# Patient Record
Sex: Female | Born: 1940 | Race: White | Hispanic: No | Marital: Married | Smoking: Never smoker
Health system: Southern US, Community
[De-identification: ages and names within clinical notes are randomized; demographics above are authoritative.]

## PROBLEM LIST (undated history)

## (undated) DIAGNOSIS — I509 Heart failure, unspecified: Secondary | ICD-10-CM

## (undated) DIAGNOSIS — F32A Depression, unspecified: Secondary | ICD-10-CM

## (undated) DIAGNOSIS — I2510 Atherosclerotic heart disease of native coronary artery without angina pectoris: Secondary | ICD-10-CM

## (undated) DIAGNOSIS — K219 Gastro-esophageal reflux disease without esophagitis: Secondary | ICD-10-CM

## (undated) DIAGNOSIS — S8290XA Unspecified fracture of unspecified lower leg, initial encounter for closed fracture: Secondary | ICD-10-CM

## (undated) DIAGNOSIS — E119 Type 2 diabetes mellitus without complications: Secondary | ICD-10-CM

## (undated) DIAGNOSIS — N189 Chronic kidney disease, unspecified: Secondary | ICD-10-CM

## (undated) DIAGNOSIS — F329 Major depressive disorder, single episode, unspecified: Secondary | ICD-10-CM

## (undated) DIAGNOSIS — K7290 Hepatic failure, unspecified without coma: Secondary | ICD-10-CM

## (undated) DIAGNOSIS — N19 Unspecified kidney failure: Secondary | ICD-10-CM

## (undated) DIAGNOSIS — I219 Acute myocardial infarction, unspecified: Secondary | ICD-10-CM

## (undated) DIAGNOSIS — M1990 Unspecified osteoarthritis, unspecified site: Secondary | ICD-10-CM

## (undated) DIAGNOSIS — I6529 Occlusion and stenosis of unspecified carotid artery: Secondary | ICD-10-CM

## (undated) DIAGNOSIS — E039 Hypothyroidism, unspecified: Secondary | ICD-10-CM

## (undated) DIAGNOSIS — I10 Essential (primary) hypertension: Secondary | ICD-10-CM

## (undated) HISTORY — PX: CHOLECYSTECTOMY: SHX55

## (undated) HISTORY — PX: AV FISTULA PLACEMENT: SHX1204

---

## 1999-01-26 ENCOUNTER — Emergency Department (HOSPITAL_COMMUNITY): Admission: EM | Admit: 1999-01-26 | Discharge: 1999-01-26 | Payer: Self-pay | Admitting: Emergency Medicine

## 1999-01-26 ENCOUNTER — Encounter: Payer: Self-pay | Admitting: Emergency Medicine

## 2006-02-07 ENCOUNTER — Inpatient Hospital Stay: Payer: Self-pay | Admitting: Internal Medicine

## 2006-02-07 ENCOUNTER — Other Ambulatory Visit: Payer: Self-pay

## 2006-07-09 ENCOUNTER — Emergency Department: Payer: Self-pay | Admitting: Emergency Medicine

## 2006-12-24 ENCOUNTER — Other Ambulatory Visit: Payer: Self-pay

## 2006-12-24 ENCOUNTER — Inpatient Hospital Stay: Payer: Self-pay | Admitting: Internal Medicine

## 2007-05-25 ENCOUNTER — Emergency Department: Payer: Self-pay | Admitting: Emergency Medicine

## 2008-03-06 ENCOUNTER — Ambulatory Visit: Payer: Self-pay | Admitting: Ophthalmology

## 2008-03-21 ENCOUNTER — Emergency Department: Payer: Self-pay | Admitting: Emergency Medicine

## 2008-07-16 ENCOUNTER — Ambulatory Visit: Payer: Self-pay | Admitting: Ophthalmology

## 2008-08-13 ENCOUNTER — Ambulatory Visit: Payer: Self-pay | Admitting: Ophthalmology

## 2009-04-26 ENCOUNTER — Emergency Department: Payer: Self-pay | Admitting: Emergency Medicine

## 2009-07-19 ENCOUNTER — Ambulatory Visit: Payer: Self-pay | Admitting: Oncology

## 2009-08-14 ENCOUNTER — Inpatient Hospital Stay: Payer: Self-pay | Admitting: Internal Medicine

## 2009-08-19 ENCOUNTER — Ambulatory Visit: Payer: Self-pay | Admitting: Oncology

## 2009-08-20 ENCOUNTER — Inpatient Hospital Stay: Payer: Self-pay | Admitting: Internal Medicine

## 2009-08-27 LAB — PATHOLOGY REPORT

## 2009-09-12 ENCOUNTER — Inpatient Hospital Stay: Payer: Self-pay | Admitting: Internal Medicine

## 2009-09-19 ENCOUNTER — Ambulatory Visit: Payer: Self-pay | Admitting: Internal Medicine

## 2009-10-03 ENCOUNTER — Emergency Department: Payer: Self-pay | Admitting: Emergency Medicine

## 2009-10-06 ENCOUNTER — Inpatient Hospital Stay: Payer: Self-pay | Admitting: Internal Medicine

## 2009-10-14 LAB — PATHOLOGY REPORT

## 2009-11-02 ENCOUNTER — Inpatient Hospital Stay: Payer: Self-pay | Admitting: Specialist

## 2009-11-24 ENCOUNTER — Emergency Department: Payer: Self-pay | Admitting: Emergency Medicine

## 2010-04-21 ENCOUNTER — Emergency Department: Payer: Self-pay | Admitting: Emergency Medicine

## 2010-05-28 ENCOUNTER — Ambulatory Visit: Payer: Self-pay | Admitting: Oncology

## 2010-06-20 ENCOUNTER — Ambulatory Visit: Payer: Self-pay | Admitting: Oncology

## 2010-07-20 ENCOUNTER — Ambulatory Visit: Payer: Self-pay | Admitting: Oncology

## 2010-07-22 ENCOUNTER — Ambulatory Visit: Payer: Self-pay | Admitting: Vascular Surgery

## 2010-08-01 ENCOUNTER — Ambulatory Visit: Payer: Self-pay | Admitting: Vascular Surgery

## 2010-08-06 ENCOUNTER — Inpatient Hospital Stay: Payer: Self-pay | Admitting: Internal Medicine

## 2010-08-15 ENCOUNTER — Emergency Department: Payer: Self-pay | Admitting: Emergency Medicine

## 2010-10-03 ENCOUNTER — Other Ambulatory Visit: Payer: Self-pay

## 2010-10-03 ENCOUNTER — Inpatient Hospital Stay: Payer: Self-pay | Admitting: Internal Medicine

## 2010-10-03 DIAGNOSIS — R072 Precordial pain: Secondary | ICD-10-CM

## 2010-10-15 ENCOUNTER — Other Ambulatory Visit: Payer: Self-pay | Admitting: Internal Medicine

## 2010-10-21 ENCOUNTER — Other Ambulatory Visit: Payer: Self-pay | Admitting: Nephrology

## 2010-10-28 ENCOUNTER — Other Ambulatory Visit: Payer: Self-pay | Admitting: Nephrology

## 2010-11-03 ENCOUNTER — Other Ambulatory Visit: Payer: Self-pay | Admitting: Nephrology

## 2010-11-11 ENCOUNTER — Other Ambulatory Visit: Payer: Self-pay | Admitting: Nephrology

## 2010-11-21 ENCOUNTER — Inpatient Hospital Stay: Payer: Self-pay | Admitting: Internal Medicine

## 2010-12-01 LAB — PATHOLOGY REPORT

## 2010-12-17 ENCOUNTER — Ambulatory Visit: Payer: Self-pay | Admitting: Vascular Surgery

## 2010-12-30 ENCOUNTER — Emergency Department: Payer: Self-pay | Admitting: Emergency Medicine

## 2011-01-15 IMAGING — CR DG CHEST 2V
1 series · 2 of 2 positions shown · non-contrast
Comparison: none

REASON FOR EXAM: weakness
COMMENTS:

PROCEDURE:     DXR - DXR CHEST PA (OR AP) AND LATERAL  - September 12, 2009  [DATE]
RESULT:     Comparison: 08/15/2009

[Series 1: view not recorded · 0.17mm/px · 2 of 2 slices shown]
[im 1/2]
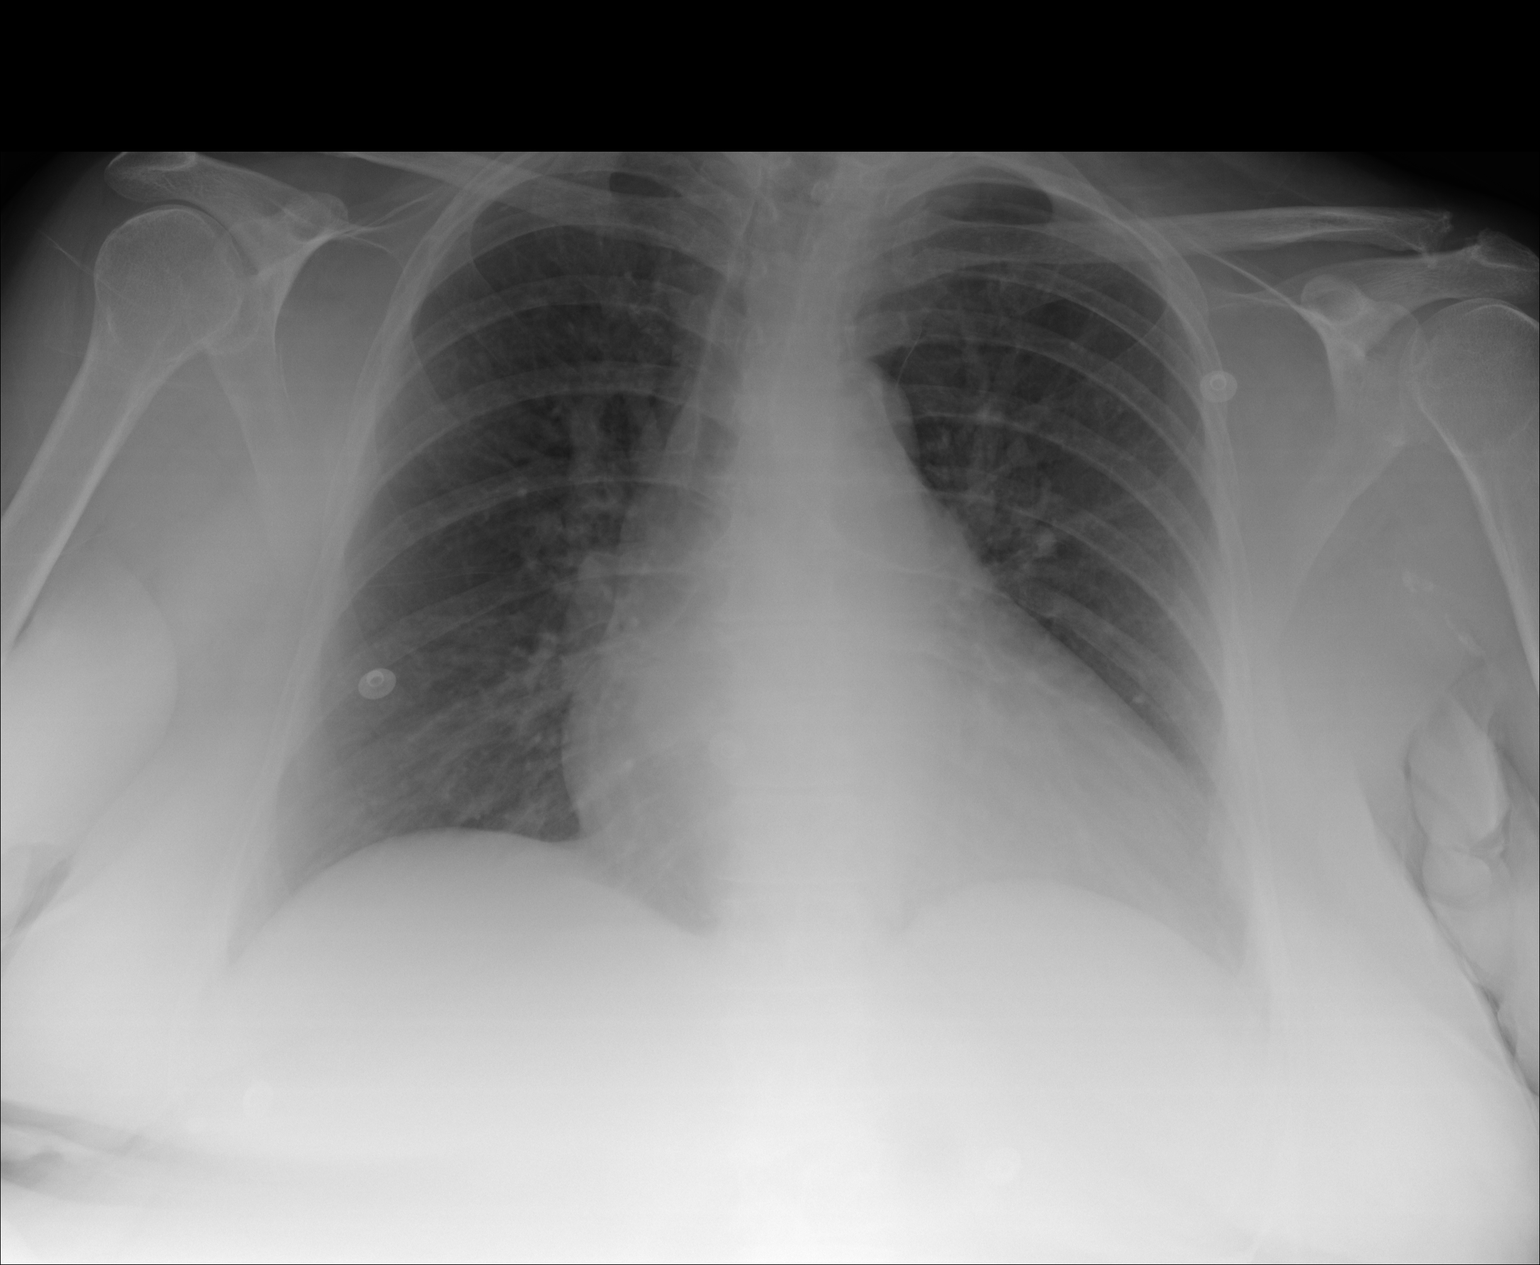
[im 2/2]
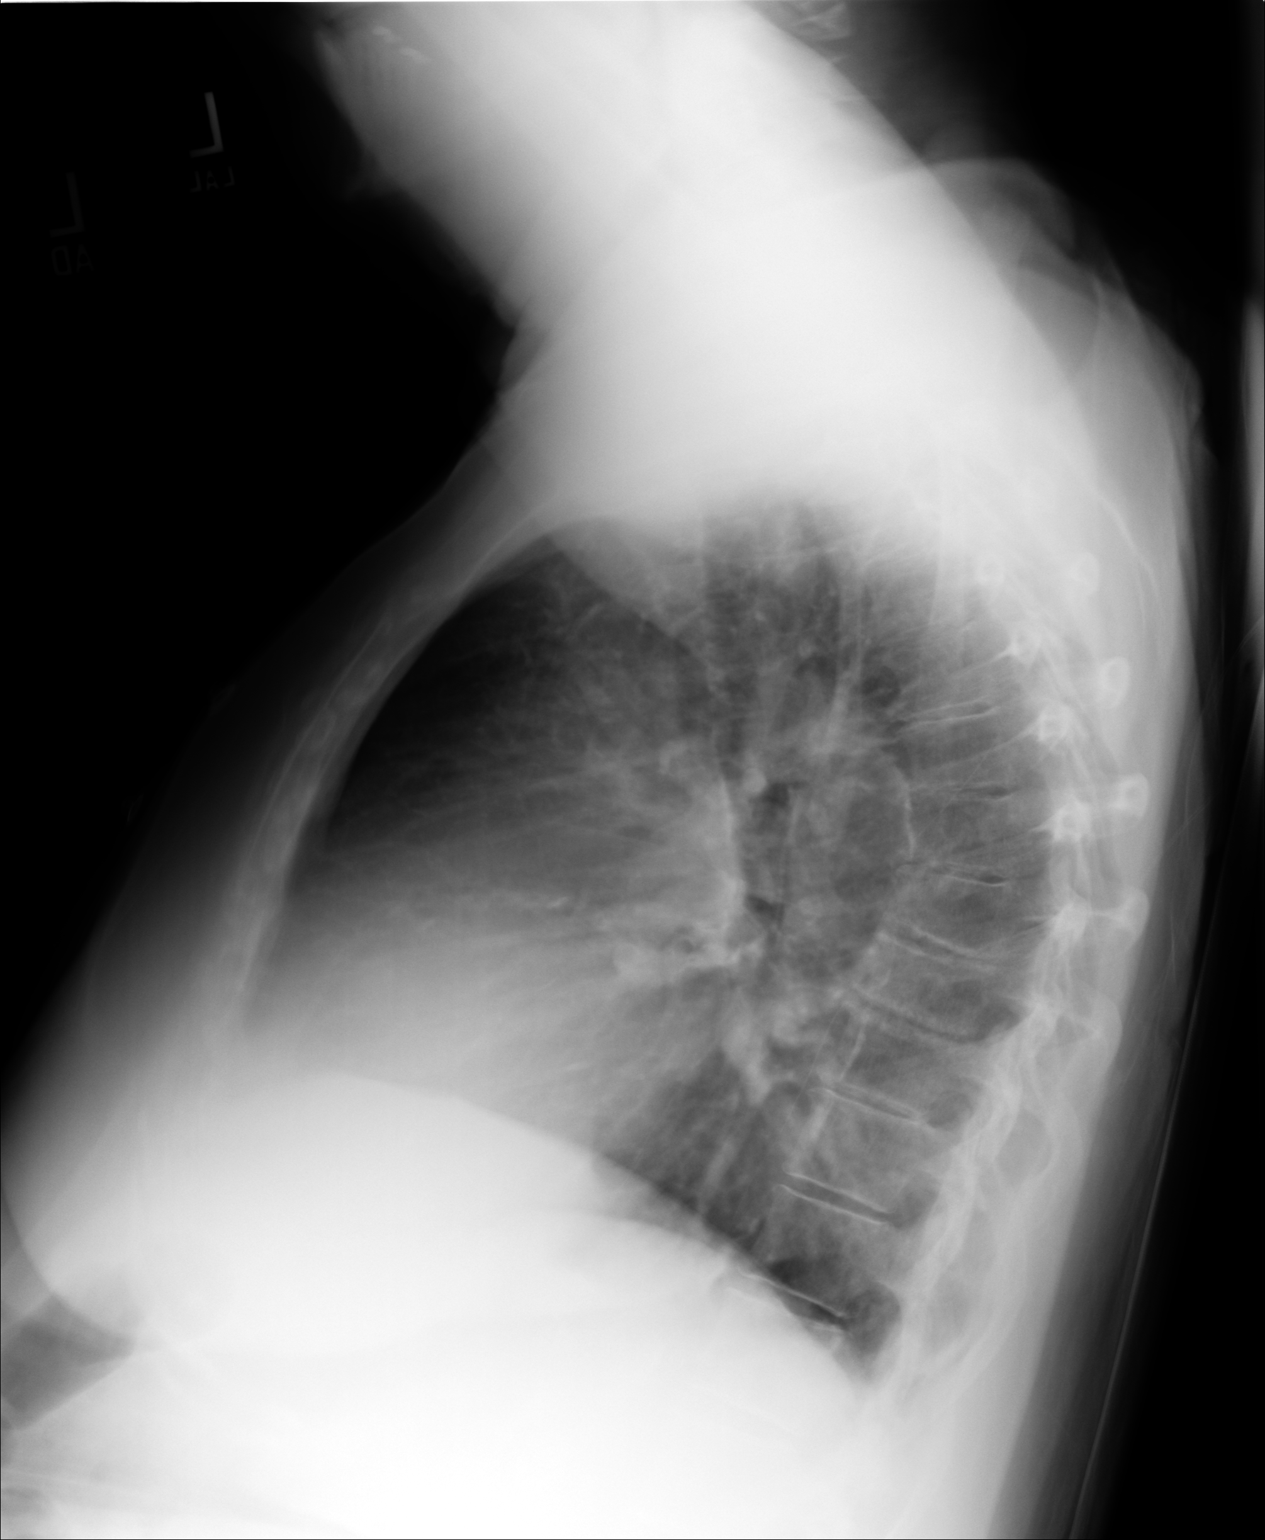

[2 of 2 positions shown; findings below may reference images not displayed]

FINDINGS: AP and lateral chest radiographs are provided. There is no focal parenchymal
opacity, pleural effusion, or pneumothorax. The heart size is enlarged. The
osseous structures are unremarkable.
IMPRESSION: No acute disease of the chest.

## 2011-03-18 ENCOUNTER — Ambulatory Visit: Payer: Self-pay | Admitting: Vascular Surgery

## 2011-03-18 LAB — POTASSIUM: Potassium: 4 mmol/L (ref 3.5–5.1)

## 2011-05-14 ENCOUNTER — Inpatient Hospital Stay: Payer: Self-pay | Admitting: *Deleted

## 2011-05-14 LAB — COMPREHENSIVE METABOLIC PANEL
Albumin: 2.7 g/dL — ABNORMAL LOW (ref 3.4–5.0)
Alkaline Phosphatase: 128 U/L (ref 50–136)
Anion Gap: 7 (ref 7–16)
BUN: 16 mg/dL (ref 7–18)
Bilirubin,Total: 0.6 mg/dL (ref 0.2–1.0)
Calcium, Total: 8.1 mg/dL — ABNORMAL LOW (ref 8.5–10.1)
Chloride: 101 mmol/L (ref 98–107)
Co2: 30 mmol/L (ref 21–32)
Creatinine: 2.2 mg/dL — ABNORMAL HIGH (ref 0.60–1.30)
EGFR (African American): 26 — ABNORMAL LOW
EGFR (Non-African Amer.): 22 — ABNORMAL LOW
Glucose: 145 mg/dL — ABNORMAL HIGH (ref 65–99)
Osmolality: 279 (ref 275–301)
Potassium: 3.8 mmol/L (ref 3.5–5.1)
SGOT(AST): 48 U/L — ABNORMAL HIGH (ref 15–37)
SGPT (ALT): 27 U/L
Sodium: 138 mmol/L (ref 136–145)
Total Protein: 6.1 g/dL — ABNORMAL LOW (ref 6.4–8.2)

## 2011-05-14 LAB — CBC
HCT: 30.9 % — ABNORMAL LOW (ref 35.0–47.0)
HGB: 10 g/dL — ABNORMAL LOW (ref 12.0–16.0)
MCHC: 32.4 g/dL (ref 32.0–36.0)
Platelet: 95 10*3/uL — ABNORMAL LOW (ref 150–440)
RBC: 3.31 10*6/uL — ABNORMAL LOW (ref 3.80–5.20)
RDW: 16.2 % — ABNORMAL HIGH (ref 11.5–14.5)
WBC: 5.9 10*3/uL (ref 3.6–11.0)

## 2011-05-14 LAB — URINALYSIS, COMPLETE
Glucose,UR: NEGATIVE mg/dL (ref 0–75)
Ketone: NEGATIVE
Nitrite: NEGATIVE
Ph: 5 (ref 4.5–8.0)
RBC,UR: 14 /HPF (ref 0–5)
Specific Gravity: 1.021 (ref 1.003–1.030)
Squamous Epithelial: 22

## 2011-05-14 LAB — TROPONIN I: Troponin-I: 0.02 ng/mL

## 2011-05-15 LAB — CBC WITH DIFFERENTIAL/PLATELET
Basophil: 1 %
Eosinophil: 4 %
HGB: 9.3 g/dL — ABNORMAL LOW (ref 12.0–16.0)
Lymphocytes: 20 %
MCH: 30.3 pg (ref 26.0–34.0)
MCHC: 32.7 g/dL (ref 32.0–36.0)
Monocytes: 20 %
Other Cells Blood: 2
Platelet: 87 10*3/uL — ABNORMAL LOW (ref 150–440)
Segmented Neutrophils: 53 %
WBC: 5.4 10*3/uL (ref 3.6–11.0)

## 2011-05-15 LAB — COMPREHENSIVE METABOLIC PANEL
BUN: 28 mg/dL — ABNORMAL HIGH (ref 7–18)
Bilirubin,Total: 0.4 mg/dL (ref 0.2–1.0)
Chloride: 103 mmol/L (ref 98–107)
EGFR (African American): 20 — ABNORMAL LOW
EGFR (Non-African Amer.): 17 — ABNORMAL LOW
Glucose: 55 mg/dL — ABNORMAL LOW (ref 65–99)
Osmolality: 286 (ref 275–301)
Potassium: 3.5 mmol/L (ref 3.5–5.1)
SGOT(AST): 41 U/L — ABNORMAL HIGH (ref 15–37)
SGPT (ALT): 26 U/L
Sodium: 142 mmol/L (ref 136–145)
Total Protein: 5.3 g/dL — ABNORMAL LOW (ref 6.4–8.2)

## 2011-05-15 LAB — MAGNESIUM: Magnesium: 1.9 mg/dL

## 2011-05-15 LAB — TROPONIN I
Troponin-I: <0.02 ng/mL
Troponin-I: 0.02 ng/mL

## 2011-05-15 LAB — CK TOTAL AND CKMB (NOT AT ARMC)
CK, Total: 82 U/L (ref 21–215)
CK-MB: 0.7 ng/mL (ref 0.5–3.6)

## 2011-05-16 LAB — URINE CULTURE

## 2011-05-16 LAB — CBC WITH DIFFERENTIAL/PLATELET
Basophil #: 0.1 10*3/uL (ref 0.0–0.1)
Eosinophil #: 0.3 10*3/uL (ref 0.0–0.7)
Eosinophil %: 5.2 %
HCT: 29.9 % — ABNORMAL LOW (ref 35.0–47.0)
HGB: 9.8 g/dL — ABNORMAL LOW (ref 12.0–16.0)
Lymphocyte #: 1.1 10*3/uL (ref 1.0–3.6)
Lymphocyte %: 16.3 %
MCH: 30.6 pg (ref 26.0–34.0)
MCV: 93 fL (ref 80–100)
Monocyte %: 26.5 %
WBC: 6.6 10*3/uL (ref 3.6–11.0)

## 2011-05-16 LAB — BASIC METABOLIC PANEL
Calcium, Total: 7.6 mg/dL — ABNORMAL LOW (ref 8.5–10.1)
Creatinine: 2.02 mg/dL — ABNORMAL HIGH (ref 0.60–1.30)
EGFR (African American): 28 — ABNORMAL LOW
EGFR (Non-African Amer.): 25 — ABNORMAL LOW
Osmolality: 281 (ref 275–301)
Potassium: 3.2 mmol/L — ABNORMAL LOW (ref 3.5–5.1)
Sodium: 142 mmol/L (ref 136–145)

## 2011-05-19 LAB — PHOSPHORUS: Phosphorus: 2.8 mg/dL (ref 2.5–4.9)

## 2011-05-20 LAB — CULTURE, BLOOD (SINGLE)

## 2011-05-21 LAB — CULTURE, BLOOD (SINGLE)

## 2011-05-22 LAB — CULTURE, BLOOD (SINGLE)

## 2011-05-24 LAB — CULTURE, BLOOD (SINGLE)

## 2011-12-16 ENCOUNTER — Ambulatory Visit: Payer: Self-pay | Admitting: Gastroenterology

## 2011-12-18 LAB — PATHOLOGY REPORT

## 2011-12-29 ENCOUNTER — Ambulatory Visit: Payer: Self-pay | Admitting: Vascular Surgery

## 2011-12-29 LAB — POTASSIUM: Potassium: 4.5 mmol/L (ref 3.5–5.1)

## 2011-12-29 LAB — GLUCOSE, RANDOM: Glucose: 354 mg/dL — ABNORMAL HIGH (ref 65–99)

## 2012-05-16 ENCOUNTER — Ambulatory Visit: Payer: Self-pay | Admitting: Vascular Surgery

## 2013-03-06 LAB — CBC WITH DIFFERENTIAL/PLATELET
Basophil #: 0.1 10*3/uL (ref 0.0–0.1)
Basophil %: 1.3 %
EOS PCT: 1 %
Eosinophil #: 0.1 10*3/uL (ref 0.0–0.7)
HCT: 35.1 % (ref 35.0–47.0)
HGB: 10.8 g/dL — AB (ref 12.0–16.0)
Lymphocyte #: 0.8 10*3/uL — ABNORMAL LOW (ref 1.0–3.6)
Lymphocyte %: 13.3 %
MCH: 27.2 pg (ref 26.0–34.0)
MCHC: 30.8 g/dL — AB (ref 32.0–36.0)
MCV: 88 fL (ref 80–100)
Monocyte #: 0.5 x10 3/mm (ref 0.2–0.9)
Monocyte %: 8.1 %
NEUTROS PCT: 76.3 %
Neutrophil #: 4.4 10*3/uL (ref 1.4–6.5)
PLATELETS: 102 10*3/uL — AB (ref 150–440)
RBC: 3.97 10*6/uL (ref 3.80–5.20)
RDW: 18 % — ABNORMAL HIGH (ref 11.5–14.5)
WBC: 5.8 10*3/uL (ref 3.6–11.0)

## 2013-03-06 LAB — URINALYSIS, COMPLETE
BILIRUBIN, UR: NEGATIVE
BLOOD: NEGATIVE
GLUCOSE, UR: NEGATIVE mg/dL (ref 0–75)
Ketone: NEGATIVE
NITRITE: NEGATIVE
Ph: 6 (ref 4.5–8.0)
RBC,UR: 3 /HPF (ref 0–5)
SPECIFIC GRAVITY: 1.015 (ref 1.003–1.030)

## 2013-03-06 LAB — BASIC METABOLIC PANEL
Anion Gap: 5 — ABNORMAL LOW (ref 7–16)
BUN: 9 mg/dL (ref 7–18)
CO2: 31 mmol/L (ref 21–32)
Calcium, Total: 8.4 mg/dL — ABNORMAL LOW (ref 8.5–10.1)
Chloride: 103 mmol/L (ref 98–107)
Creatinine: 1.64 mg/dL — ABNORMAL HIGH (ref 0.60–1.30)
EGFR (African American): 36 — ABNORMAL LOW
GFR CALC NON AF AMER: 31 — AB
Glucose: 177 mg/dL — ABNORMAL HIGH (ref 65–99)
Osmolality: 281 (ref 275–301)
POTASSIUM: 3.6 mmol/L (ref 3.5–5.1)
Sodium: 139 mmol/L (ref 136–145)

## 2013-03-06 LAB — TROPONIN I: Troponin-I: <0.02 ng/mL

## 2013-03-07 ENCOUNTER — Inpatient Hospital Stay: Payer: Self-pay | Admitting: Internal Medicine

## 2013-03-07 LAB — LIPID PANEL
CHOLESTEROL: 102 mg/dL (ref 0–200)
HDL Cholesterol: 43 mg/dL (ref 40–60)
LDL CHOLESTEROL, CALC: 33 mg/dL (ref 0–100)
TRIGLYCERIDES: 128 mg/dL (ref 0–200)
VLDL CHOLESTEROL, CALC: 26 mg/dL (ref 5–40)

## 2013-09-28 ENCOUNTER — Inpatient Hospital Stay: Payer: Self-pay | Admitting: Internal Medicine

## 2013-09-28 LAB — CBC WITH DIFFERENTIAL/PLATELET
BASOS ABS: 0.1 10*3/uL (ref 0.0–0.1)
BASOS PCT: 1.2 %
Eosinophil #: 0.1 10*3/uL (ref 0.0–0.7)
Eosinophil %: 1.4 %
HCT: 43.2 % (ref 35.0–47.0)
HGB: 13.2 g/dL (ref 12.0–16.0)
LYMPHS ABS: 0.9 10*3/uL — AB (ref 1.0–3.6)
Lymphocyte %: 14.4 %
MCH: 26 pg (ref 26.0–34.0)
MCHC: 30.6 g/dL — ABNORMAL LOW (ref 32.0–36.0)
MCV: 85 fL (ref 80–100)
MONO ABS: 0.5 x10 3/mm (ref 0.2–0.9)
MONOS PCT: 8.6 %
Neutrophil #: 4.5 10*3/uL (ref 1.4–6.5)
Neutrophil %: 74.4 %
Platelet: 122 10*3/uL — ABNORMAL LOW (ref 150–440)
RBC: 5.09 10*6/uL (ref 3.80–5.20)
RDW: 19.2 % — ABNORMAL HIGH (ref 11.5–14.5)
WBC: 6 10*3/uL (ref 3.6–11.0)

## 2013-09-28 LAB — COMPREHENSIVE METABOLIC PANEL
ALBUMIN: 3.1 g/dL — AB (ref 3.4–5.0)
ANION GAP: 11 (ref 7–16)
AST: 39 U/L — AB (ref 15–37)
Alkaline Phosphatase: 166 U/L — ABNORMAL HIGH
BILIRUBIN TOTAL: 0.5 mg/dL (ref 0.2–1.0)
BUN: 30 mg/dL — ABNORMAL HIGH (ref 7–18)
CHLORIDE: 103 mmol/L (ref 98–107)
Calcium, Total: 9.1 mg/dL (ref 8.5–10.1)
Co2: 25 mmol/L (ref 21–32)
Creatinine: 2.88 mg/dL — ABNORMAL HIGH (ref 0.60–1.30)
EGFR (Non-African Amer.): 16 — ABNORMAL LOW
GFR CALC AF AMER: 18 — AB
Glucose: 353 mg/dL — ABNORMAL HIGH (ref 65–99)
OSMOLALITY: 298 (ref 275–301)
Potassium: 3.8 mmol/L (ref 3.5–5.1)
SGPT (ALT): 27 U/L
SODIUM: 139 mmol/L (ref 136–145)
Total Protein: 6.7 g/dL (ref 6.4–8.2)

## 2013-09-28 LAB — URINALYSIS, COMPLETE
Bilirubin,UR: NEGATIVE
Glucose,UR: >=500 mg/dL (ref 0–75)
Ketone: NEGATIVE
Nitrite: NEGATIVE
Ph: 8 (ref 4.5–8.0)
Protein: 30
Specific Gravity: 1.01 (ref 1.003–1.030)
Squamous Epithelial: 23
WBC UR: 8 /HPF (ref 0–5)

## 2013-09-28 LAB — LIPASE, BLOOD: LIPASE: 243 U/L (ref 73–393)

## 2013-09-28 LAB — TROPONIN I: Troponin-I: <0.02 ng/mL

## 2013-09-29 LAB — RENAL FUNCTION PANEL
ALBUMIN: 2.6 g/dL — AB (ref 3.4–5.0)
ANION GAP: 11 (ref 7–16)
BUN: 33 mg/dL — ABNORMAL HIGH (ref 7–18)
CHLORIDE: 108 mmol/L — AB (ref 98–107)
CO2: 22 mmol/L (ref 21–32)
Calcium, Total: 8.4 mg/dL — ABNORMAL LOW (ref 8.5–10.1)
Creatinine: 2.73 mg/dL — ABNORMAL HIGH (ref 0.60–1.30)
EGFR (Non-African Amer.): 17 — ABNORMAL LOW
GFR CALC AF AMER: 19 — AB
Glucose: 273 mg/dL — ABNORMAL HIGH (ref 65–99)
OSMOLALITY: 298 (ref 275–301)
Phosphorus: 2.3 mg/dL — ABNORMAL LOW (ref 2.5–4.9)
Potassium: 3.7 mmol/L (ref 3.5–5.1)
SODIUM: 141 mmol/L (ref 136–145)

## 2013-09-29 LAB — CLOSTRIDIUM DIFFICILE(ARMC)

## 2013-09-30 LAB — WBCS, STOOL

## 2013-10-01 LAB — URINE CULTURE

## 2013-10-27 LAB — BASIC METABOLIC PANEL
ANION GAP: 9 (ref 7–16)
BUN: 15 mg/dL (ref 7–18)
CHLORIDE: 100 mmol/L (ref 98–107)
CO2: 32 mmol/L (ref 21–32)
Calcium, Total: 8.1 mg/dL — ABNORMAL LOW (ref 8.5–10.1)
Creatinine: 2.17 mg/dL — ABNORMAL HIGH (ref 0.60–1.30)
EGFR (African American): 29 — ABNORMAL LOW
EGFR (Non-African Amer.): 24 — ABNORMAL LOW
Glucose: 316 mg/dL — ABNORMAL HIGH (ref 65–99)
Osmolality: 294 (ref 275–301)
Potassium: 3.9 mmol/L (ref 3.5–5.1)
Sodium: 141 mmol/L (ref 136–145)

## 2013-10-27 LAB — CBC WITH DIFFERENTIAL/PLATELET
BASOS PCT: 1.1 %
Basophil #: 0.1 10*3/uL (ref 0.0–0.1)
EOS ABS: 0.1 10*3/uL (ref 0.0–0.7)
Eosinophil %: 1.7 %
HCT: 31.4 % — ABNORMAL LOW (ref 35.0–47.0)
HGB: 9.6 g/dL — AB (ref 12.0–16.0)
LYMPHS ABS: 0.8 10*3/uL — AB (ref 1.0–3.6)
LYMPHS PCT: 15.6 %
MCH: 25 pg — ABNORMAL LOW (ref 26.0–34.0)
MCHC: 30.5 g/dL — AB (ref 32.0–36.0)
MCV: 82 fL (ref 80–100)
MONO ABS: 0.5 x10 3/mm (ref 0.2–0.9)
Monocyte %: 10.6 %
Neutrophil #: 3.5 10*3/uL (ref 1.4–6.5)
Neutrophil %: 71 %
Platelet: 111 10*3/uL — ABNORMAL LOW (ref 150–440)
RBC: 3.83 10*6/uL (ref 3.80–5.20)
RDW: 19.1 % — AB (ref 11.5–14.5)
WBC: 4.9 10*3/uL (ref 3.6–11.0)

## 2013-10-28 ENCOUNTER — Inpatient Hospital Stay: Payer: Self-pay | Admitting: Orthopedic Surgery

## 2013-10-28 ENCOUNTER — Ambulatory Visit: Payer: Self-pay | Admitting: Orthopedic Surgery

## 2013-10-28 LAB — HEPATIC FUNCTION PANEL A (ARMC)
ALK PHOS: 150 U/L — AB
ALT: 35 U/L
Albumin: 3.1 g/dL — ABNORMAL LOW (ref 3.4–5.0)
Bilirubin, Direct: 0.2 mg/dL (ref 0.00–0.20)
Bilirubin,Total: 0.3 mg/dL (ref 0.2–1.0)
SGOT(AST): 48 U/L — ABNORMAL HIGH (ref 15–37)
TOTAL PROTEIN: 6.3 g/dL — AB (ref 6.4–8.2)

## 2013-10-28 LAB — BASIC METABOLIC PANEL
Anion Gap: 6 — ABNORMAL LOW (ref 7–16)
BUN: 21 mg/dL — AB (ref 7–18)
CALCIUM: 7.7 mg/dL — AB (ref 8.5–10.1)
CO2: 30 mmol/L (ref 21–32)
Chloride: 102 mmol/L (ref 98–107)
Creatinine: 2.75 mg/dL — ABNORMAL HIGH (ref 0.60–1.30)
EGFR (African American): 22 — ABNORMAL LOW
GFR CALC NON AF AMER: 18 — AB
GLUCOSE: 311 mg/dL — AB (ref 65–99)
Osmolality: 290 (ref 275–301)
Potassium: 3.9 mmol/L (ref 3.5–5.1)
SODIUM: 138 mmol/L (ref 136–145)

## 2013-10-28 LAB — PROTIME-INR
INR: 1.1
Prothrombin Time: 14.1 secs (ref 11.5–14.7)

## 2013-10-28 LAB — CK: CK, TOTAL: 502 U/L — AB

## 2013-10-29 LAB — CBC WITH DIFFERENTIAL/PLATELET
Basophil #: 0 10*3/uL (ref 0.0–0.1)
Basophil %: 0.3 %
EOS PCT: 1.7 %
Eosinophil #: 0.1 10*3/uL (ref 0.0–0.7)
HCT: 28.7 % — ABNORMAL LOW (ref 35.0–47.0)
HGB: 8.6 g/dL — ABNORMAL LOW (ref 12.0–16.0)
Lymphocyte #: 0.5 10*3/uL — ABNORMAL LOW (ref 1.0–3.6)
Lymphocyte %: 7.1 %
MCH: 25.1 pg — AB (ref 26.0–34.0)
MCHC: 29.9 g/dL — AB (ref 32.0–36.0)
MCV: 84 fL (ref 80–100)
MONOS PCT: 13.6 %
Monocyte #: 1 x10 3/mm — ABNORMAL HIGH (ref 0.2–0.9)
Neutrophil #: 5.9 10*3/uL (ref 1.4–6.5)
Neutrophil %: 77.3 %
Platelet: 103 10*3/uL — ABNORMAL LOW (ref 150–440)
RBC: 3.43 10*6/uL — ABNORMAL LOW (ref 3.80–5.20)
RDW: 19.5 % — ABNORMAL HIGH (ref 11.5–14.5)
WBC: 7.6 10*3/uL (ref 3.6–11.0)

## 2013-10-29 LAB — BASIC METABOLIC PANEL
Anion Gap: 8 (ref 7–16)
BUN: 36 mg/dL — AB (ref 7–18)
Calcium, Total: 7.7 mg/dL — ABNORMAL LOW (ref 8.5–10.1)
Chloride: 102 mmol/L (ref 98–107)
Co2: 30 mmol/L (ref 21–32)
Creatinine: 3.38 mg/dL — ABNORMAL HIGH (ref 0.60–1.30)
EGFR (African American): 17 — ABNORMAL LOW
EGFR (Non-African Amer.): 14 — ABNORMAL LOW
Glucose: 259 mg/dL — ABNORMAL HIGH (ref 65–99)
Osmolality: 297 (ref 275–301)
Potassium: 4.2 mmol/L (ref 3.5–5.1)
SODIUM: 140 mmol/L (ref 136–145)

## 2013-10-30 LAB — CBC WITH DIFFERENTIAL/PLATELET
BASOS ABS: 0 10*3/uL (ref 0.0–0.1)
BASOS PCT: 0.4 %
Eosinophil #: 0.1 10*3/uL (ref 0.0–0.7)
Eosinophil %: 0.9 %
HCT: 24.7 % — ABNORMAL LOW (ref 35.0–47.0)
HGB: 7.5 g/dL — AB (ref 12.0–16.0)
LYMPHS ABS: 0.6 10*3/uL — AB (ref 1.0–3.6)
Lymphocyte %: 8.8 %
MCH: 25.1 pg — ABNORMAL LOW (ref 26.0–34.0)
MCHC: 30.5 g/dL — ABNORMAL LOW (ref 32.0–36.0)
MCV: 82 fL (ref 80–100)
Monocyte #: 0.9 x10 3/mm (ref 0.2–0.9)
Monocyte %: 13.8 %
NEUTROS ABS: 4.9 10*3/uL (ref 1.4–6.5)
NEUTROS PCT: 76.1 %
PLATELETS: 87 10*3/uL — AB (ref 150–440)
RBC: 3 10*6/uL — ABNORMAL LOW (ref 3.80–5.20)
RDW: 19.1 % — AB (ref 11.5–14.5)
WBC: 6.4 10*3/uL (ref 3.6–11.0)

## 2013-10-30 LAB — RENAL FUNCTION PANEL
Albumin: 2.2 g/dL — ABNORMAL LOW (ref 3.4–5.0)
Anion Gap: 10 (ref 7–16)
BUN: 43 mg/dL — ABNORMAL HIGH (ref 7–18)
CALCIUM: 7.3 mg/dL — AB (ref 8.5–10.1)
CHLORIDE: 101 mmol/L (ref 98–107)
CREATININE: 3.34 mg/dL — AB (ref 0.60–1.30)
Co2: 25 mmol/L (ref 21–32)
EGFR (African American): 17 — ABNORMAL LOW
EGFR (Non-African Amer.): 14 — ABNORMAL LOW
Glucose: 287 mg/dL — ABNORMAL HIGH (ref 65–99)
OSMOLALITY: 293 (ref 275–301)
Phosphorus: 3.5 mg/dL (ref 2.5–4.9)
Potassium: 5 mmol/L (ref 3.5–5.1)
SODIUM: 136 mmol/L (ref 136–145)

## 2013-10-31 LAB — CBC WITH DIFFERENTIAL/PLATELET
Basophil #: 0 10*3/uL (ref 0.0–0.1)
Basophil %: 0.4 %
EOS PCT: 0.1 %
Eosinophil #: 0 10*3/uL (ref 0.0–0.7)
HCT: 26 % — ABNORMAL LOW (ref 35.0–47.0)
HGB: 7.8 g/dL — ABNORMAL LOW (ref 12.0–16.0)
LYMPHS ABS: 0.7 10*3/uL — AB (ref 1.0–3.6)
LYMPHS PCT: 10 %
MCH: 25 pg — AB (ref 26.0–34.0)
MCHC: 30.1 g/dL — ABNORMAL LOW (ref 32.0–36.0)
MCV: 83 fL (ref 80–100)
Monocyte #: 1.3 x10 3/mm — ABNORMAL HIGH (ref 0.2–0.9)
Monocyte %: 18 %
NEUTROS ABS: 5.2 10*3/uL (ref 1.4–6.5)
Neutrophil %: 71.5 %
PLATELETS: 103 10*3/uL — AB (ref 150–440)
RBC: 3.13 10*6/uL — ABNORMAL LOW (ref 3.80–5.20)
RDW: 19 % — ABNORMAL HIGH (ref 11.5–14.5)
WBC: 7.2 10*3/uL (ref 3.6–11.0)

## 2013-10-31 LAB — BASIC METABOLIC PANEL
ANION GAP: 10 (ref 7–16)
BUN: 36 mg/dL — AB (ref 7–18)
CO2: 29 mmol/L (ref 21–32)
Calcium, Total: 7.6 mg/dL — ABNORMAL LOW (ref 8.5–10.1)
Chloride: 99 mmol/L (ref 98–107)
Creatinine: 2.79 mg/dL — ABNORMAL HIGH (ref 0.60–1.30)
EGFR (African American): 21 — ABNORMAL LOW
EGFR (Non-African Amer.): 18 — ABNORMAL LOW
Glucose: 279 mg/dL — ABNORMAL HIGH (ref 65–99)
OSMOLALITY: 294 (ref 275–301)
Potassium: 4.2 mmol/L (ref 3.5–5.1)
Sodium: 138 mmol/L (ref 136–145)

## 2013-10-31 LAB — HEMOGLOBIN: HGB: 7.1 g/dL — ABNORMAL LOW (ref 12.0–16.0)

## 2013-11-01 LAB — BASIC METABOLIC PANEL
Anion Gap: 10 (ref 7–16)
BUN: 50 mg/dL — ABNORMAL HIGH (ref 7–18)
Calcium, Total: 7.7 mg/dL — ABNORMAL LOW (ref 8.5–10.1)
Chloride: 99 mmol/L (ref 98–107)
Co2: 27 mmol/L (ref 21–32)
Creatinine: 3.45 mg/dL — ABNORMAL HIGH (ref 0.60–1.30)
EGFR (African American): 17 — ABNORMAL LOW
GFR CALC NON AF AMER: 14 — AB
Glucose: 277 mg/dL — ABNORMAL HIGH (ref 65–99)
OSMOLALITY: 295 (ref 275–301)
Potassium: 4.4 mmol/L (ref 3.5–5.1)
Sodium: 136 mmol/L (ref 136–145)

## 2013-11-01 LAB — CBC WITH DIFFERENTIAL/PLATELET
BASOS ABS: 0.1 10*3/uL (ref 0.0–0.1)
BASOS PCT: 1.2 %
EOS PCT: 3.7 %
Eosinophil #: 0.2 10*3/uL (ref 0.0–0.7)
HCT: 28.7 % — ABNORMAL LOW (ref 35.0–47.0)
HGB: 8.6 g/dL — AB (ref 12.0–16.0)
LYMPHS PCT: 12.5 %
Lymphocyte #: 0.7 10*3/uL — ABNORMAL LOW (ref 1.0–3.6)
MCH: 25 pg — ABNORMAL LOW (ref 26.0–34.0)
MCHC: 30.1 g/dL — ABNORMAL LOW (ref 32.0–36.0)
MCV: 83 fL (ref 80–100)
MONOS PCT: 16.4 %
Monocyte #: 0.9 x10 3/mm (ref 0.2–0.9)
Neutrophil #: 3.7 10*3/uL (ref 1.4–6.5)
Neutrophil %: 66.2 %
Platelet: 110 10*3/uL — ABNORMAL LOW (ref 150–440)
RBC: 3.46 10*6/uL — ABNORMAL LOW (ref 3.80–5.20)
RDW: 19.1 % — ABNORMAL HIGH (ref 11.5–14.5)
WBC: 5.6 10*3/uL (ref 3.6–11.0)

## 2013-11-01 LAB — PHOSPHORUS: Phosphorus: 4.1 mg/dL (ref 2.5–4.9)

## 2013-11-02 LAB — BASIC METABOLIC PANEL
Anion Gap: 9 (ref 7–16)
BUN: 33 mg/dL — ABNORMAL HIGH (ref 7–18)
CHLORIDE: 100 mmol/L (ref 98–107)
CO2: 28 mmol/L (ref 21–32)
CREATININE: 2.69 mg/dL — AB (ref 0.60–1.30)
Calcium, Total: 7.9 mg/dL — ABNORMAL LOW (ref 8.5–10.1)
EGFR (Non-African Amer.): 18 — ABNORMAL LOW
GFR CALC AF AMER: 22 — AB
Glucose: 235 mg/dL — ABNORMAL HIGH (ref 65–99)
OSMOLALITY: 289 (ref 275–301)
POTASSIUM: 4.3 mmol/L (ref 3.5–5.1)
Sodium: 137 mmol/L (ref 136–145)

## 2013-11-02 LAB — HEMOGLOBIN A1C: Hemoglobin A1C: 9.4 % — ABNORMAL HIGH (ref 4.2–6.3)

## 2013-11-02 LAB — PLATELET COUNT: Platelet: 117 10*3/uL — ABNORMAL LOW (ref 150–440)

## 2013-11-02 LAB — HEMOGLOBIN: HGB: 9.5 g/dL — ABNORMAL LOW (ref 12.0–16.0)

## 2013-11-03 LAB — PHOSPHORUS: Phosphorus: 3 mg/dL (ref 2.5–4.9)

## 2014-01-18 ENCOUNTER — Encounter: Payer: Self-pay | Admitting: Surgery

## 2014-01-19 ENCOUNTER — Encounter: Payer: Self-pay | Admitting: Surgery

## 2014-02-01 ENCOUNTER — Encounter: Payer: Self-pay | Admitting: Surgery

## 2014-02-15 ENCOUNTER — Ambulatory Visit: Payer: Self-pay | Admitting: Family

## 2014-02-19 ENCOUNTER — Encounter: Payer: Self-pay | Admitting: Surgery

## 2014-03-02 ENCOUNTER — Inpatient Hospital Stay: Payer: Self-pay | Admitting: Internal Medicine

## 2014-03-20 ENCOUNTER — Encounter
Admit: 2014-03-20 | Disposition: A | Discharge status: Home / Self Care | Payer: Self-pay | Attending: Cardiothoracic Surgery | Admitting: Cardiothoracic Surgery

## 2014-03-20 ENCOUNTER — Encounter
Admit: 2014-03-20 | Disposition: A | Discharge status: Home / Self Care | Payer: Self-pay | Attending: Surgery | Admitting: Surgery

## 2014-03-27 ENCOUNTER — Ambulatory Visit: Payer: Self-pay | Admitting: Family

## 2014-04-20 ENCOUNTER — Encounter
Admit: 2014-04-20 | Disposition: A | Discharge status: Home / Self Care | Payer: Self-pay | Attending: Cardiothoracic Surgery | Admitting: Cardiothoracic Surgery

## 2014-05-08 NOTE — Op Note (Signed)
PATIENT NAME:  [name], Savannah Key MR#:  [id number] DATE OF BIRTH:  11-06-1940  DATE OF PROCEDURE:  12/29/2011  PREOPERATIVE DIAGNOSES:  1. End-stage renal disease requiring hemodialysis.  2. Poorly functioning dialysis access, left arm brachial axillary dialysis graft.   POSTOPERATIVE DIAGNOSIS:  1. End-stage renal disease requiring hemodialysis.  2. Poorly functioning dialysis access, left arm brachial axillary dialysis graft.   PROCEDURES PERFORMED: 1. Left upper extremity brachial axillary shuntogram.  2. Percutaneous transluminal angioplasty to 7 mm, venous portion left arm graft.   SURGEON: Levora DredgeGregory Rehaan Viloria, MD  SEDATION: Versed 3 mg plus fentanyl 100 mcg administered IV. Continuous ECG, pulse oximetry, and cardiopulmonary monitoring was performed throughout the entire procedure by the interventional radiology nurse. Total sedation time was 45 minutes.   ACCESS: 6 French sheath, antegrade direction, left arm AV graft.   CONTRAST USED: Isovue 25 mL.   FLUORO TIME: 0.7 minutes.   INDICATIONS: [name] is a 74 year old woman who was found to have increasing problems with her dialysis access. Physical examination as well as noninvasive studies demonstrated high-grade stricture, in the venous portion. She is therefore undergoing contrast angiography with the hope of intervention. Risks and benefits were reviewed, all questions answered, and the patient agrees to proceed.   DESCRIPTION OF PROCEDURE: The patient is taken to special procedures and placed in the supine position. After adequate sedation is achieved, she is positioned supine with her left arm extended palm upward. Left arm is prepped and draped in sterile fashion. 1% lidocaine is infiltrated in the soft tissues near the arterial anastomosis and access to the AV graft is obtained with a micropuncture needle, microwire followed by microsheath, J-wire followed by 6 French sheath. Hand injection of contrast is utilized to demonstrate  the graft as well as the central venous anatomy. After review of the images, 3000 units of heparin is given, Magic torque wire is advanced through the stricture, and a 7 x 6 Rival balloon is advanced across the stenoses and inflated to 14 atmospheres for one minute.    Followup angiography demonstrated complete resolution of the previous stenosis. With the balloon inflated, reflux images demonstrate a widely patent arterial.   INTERPRETATION: Initial views of the graft demonstrate greater than 80% stenosis, in the venous portion, of the AV graft, at the site of cannulation. Central veins are widely patent. Arterial anastomosis is widely patent. Following angioplasty to 7 mm, there is complete resolution of the stricture and stenosis.   SUMMARY: Successful salvage of left arm brachial axillary dialysis graft, as described above. ____________________________ Renford DillsGregory G. Ethal Gotay, MD ggs:slb D: 12/29/2011 16:46:33 ET T: 12/29/2011 17:15:59 ET JOB#: [id number]  cc: Renford DillsGregory G. Aroura Vasudevan, MD, <Dictator> Colette S. Freida BusmanAllen, NP Renford DillsGREGORY G Juno Alers MD ELECTRONICALLY SIGNED 01/06/2012 16:23

## 2014-05-11 NOTE — Op Note (Signed)
PATIENT NAME:  Savannah Key, Savannah Key MR#:  [id number] DATE OF BIRTH:  06-Oct-1940  DATE OF PROCEDURE:  05/16/2012  PREOPERATIVE DIAGNOSES: 1.  End-stage renal disease.  2.  Clotted left arm arteriovenous graft.  3.  Hypertension.   POSTOPERATIVE DIAGNOSES:  1.  End-stage renal disease. 2.  Clotted left arm arteriovenous graft. 3.  Hypertension.  PROCEDURE:  1.  Ultrasound guidance for vascular access to left arm arteriovenous graft in both an antegrade and retrograde fashion crossing.  2.  Left upper extremity shuntogram and central venogram.  3.  Catheter-directed thrombolysis to left arm arteriovenous graft with 4 mg of TPA instilled with the AngioJet AVX Catheter.  4.  Mechanical rheolytic thrombectomy with the AngioJet AVX Catheter to the arteriovenous graft.  5.  Fogarty embolectomy for arterial plug of the arteriovenous graft.  6.  Percutaneous transluminal angioplasty of arterial anastomosis for residual stenosis after Fogarty embolectomy with a 6 mm diameter angioplasty balloon.  7.  Percutaneous transluminal angioplasty of mid and distal graft for multiple areas of residual thrombosis and stenosis after thrombolysis and thrombectomy.   SURGEON: Annice NeedyJason S Dew, MD   ANESTHESIA: Local with moderate conscious sedation.   ESTIMATED BLOOD LOSS: Approximately 25 mL.  CONTRAST USED: 30 mL Visipaque.   FLUOROSCOPY TIME:  4 minutes were used.  INDICATION FOR PROCEDURE: The patient is a 74 year old white female with end-stage renal disease.  She presents with a clotted access. We are attempting to salvage this today.   DESCRIPTION OF PROCEDURE: The patient was brought to the vascular and interventional radiology suite. Left upper extremity was sterilely prepped and draped, and a sterile surgical field was created. The graft was accessed first in an antegrade fashion and then a retrograde fashion in crossing direction to gain access.  Ultrasound was used due to the pulseless nature of the graft,  and permanent image was recorded; 6-French sheaths were placed, and the patient was given 3000 units of intravenous heparin. Imaging showed a thrombosed graft. TPA 4 mg was instilled from the brachial artery to the axillary vein encompassing the length of the graft. This was allowed to dwell for approximately 10 to 15 minutes. Mechanical rheolytic thrombectomy was performed as well throughout the entirety of the graft. There was a residual arterial plug, and a 5 Fogarty embolectomy catheter was initially used with improvement but not resolution of the narrowing near the arterial anastomosis. I then performed percutaneous transluminal angioplasty with a 6 mm diameter angioplasty balloon with good angiographic completion result of the arterial anastomosis in the proximal portion of the graft. I then removed the retrograde sheath.  The mid-to-distal graft had areas of stenosis and thrombosis that were flow limiting. A 6 mm diameter x 10 cm angioplasty balloon was inflated encompassing these areas.  Waists were taken which resolved. Completion angiogram following this showed markedly improved flow with brisk outflow. The axillary vein, subclavian, innominate and superior vena cava were all widely patent. At this point, I elected to terminate the procedure. The second sheath was removed around a 4-0 Monocryl pursestring suture. Pressure was held. Sterile dressing was placed. The patient tolerated the procedure well and was taken to the recovery room in stable condition.    ____________________________ Annice NeedyJason S. Dew, MD jsd:cb D: 05/16/2012 17:49:25 ET T: 05/16/2012 21:05:02 ET JOB#: [id number]  cc: Annice NeedyJason S. Dew, MD, <Dictator> Annice NeedyJASON S DEW MD ELECTRONICALLY SIGNED 05/18/2012 13:23

## 2014-05-12 NOTE — Discharge Summary (Signed)
PATIENT NAME:  Savannah Key, Savannah Key MR#:  [id number] DATE OF BIRTH:  07-30-1940  DATE OF ADMISSION:  10/28/2013 DATE OF DISCHARGE:  11/04/2013  ADMITTING DIAGNOSIS: Right leg tibia and fibula fracture with probable acute compartment syndrome.   DISCHARGE DIAGNOSIS: Right leg tibia and fibula fracture with probable acute compartment syndrome.   OPERATION: On 10 /10/2013, the patient had an initial 4 compartment pressure assessment of the right leg for compartment syndrome. The patient had surgery initially by Dr. Annamary RummageJohn Sloboda with estimated blood loss minimal with less than 5 mL. The patient had an emergent fasciotomy of the right leg with no fixation of the fracture site. The patient then had a second surgery on 10/30/2013. The patient had a right tibial fracture closure of the fasciotomy with irrigation done by Dr. Ernest PineHooten and estimated blood loss was minimal with fluid replaced of 500 mL. The patient was stabilized and then brought back to the Operating Room on 11/01/2013 for a final reduction and intramedullary nailing of the right tibial shaft fracture again by Dr. Ernest PineHooten. The patient had estimated blood loss of 100 mL with fluids replaced 650 mL of crystalloid and 1 unit of packed red blood cells. Implants used at that time were Synthes 12 mm x 285 mm cannulated titanium tibial nail, four 5.0 mm locking screws. The patient was stabilized, brought to the recovery room, and then brought down to the orthopedic floor.   HISTORY: The patient is a 74 year old female who presented to the Emergency Room after a fall at home with acute leg pain. X-rays showed a tibial and fibular fracture with significant pain and swelling, concern for compartment syndrome. The patient was unable to do any type of motion of the ankle and with loss of sensation and palpable pulse.   PHYSICAL EXAMINATION: GENERAL: Alert female in acute distress.  HEART: Regular rate and rhythm with no murmur.  LUNGS: Clear to auscultation  bilaterally.  MUSCULOSKELETAL: In regard to the right lower extremity, the patient has her leg wrapped and immobilized from the OR. The patient has significant ecchymosis around the leg and has no motion of that ankle with hypersensitivity to any pain. X-rays revealed a right tibia and fibula displaced midshaft fracture.   HOSPITAL COURSE: After initial admission on 10/28/2013, the patient was brought to the orthopedic floor after having the emergent fasciotomy. The patient was followed by medicine and the patient then on postoperative day 2 from her initial fasciotomy had a closure of the fasciotomy by Dr. Ernest PineHooten. Then on postoperative day 4 from the initial surgery, which was October 14, she had the ORIF with tibial rodding done on 11/01/2013 by Dr. Ernest PineHooten. The patient did receive 1 unit of transfused blood. The patient was then treated postoperatively with physical therapy and pain control while on the orthopedic floor. The patient did slowly progress with physical therapy. The patient still had remarkable pain but was more comfortable as to her previous surgery. The patient was ready to go to rehab on 11/04/2013.   CONDITION AT DISCHARGE: Stable.   DISPOSITION: The patient was sent to rehab.   DISCHARGE INSTRUCTIONS: [facility] within the week with Dr. Ernest PineHooten and Van ClinesJon Wolfe. The patient will do weight on the affected extremity with partial weight-bear. The patient will raise her leg with 1 to 2 pillows to decrease swelling. The patient will use knee-high TED hose on both legs to be removed 1 hour 8 eight hour shift. The patient will use  the incentive spirometer and be encouraged to do cough and deep breathing. The patient does have a diabetic diet. The patient will use Polar Care to decrease swelling and keep her dressing clean and dry.  The patient will try not to get her dressing wet. The clinic will be called by rehab or the patient if there is any  bright red bleeding or any calf pain, or bowel or bladder difficulty, or any fever greater than 101.5. The patient will do physical therapy and occupational therapy per protocol. The patient is not allowed to do any showering until the staples are removed.   DISCHARGE MEDICATIONS: Levothyroxine 50 mcg 1 tablet daily, trazodone 50 mg 1 tablet at bedtime for insomnia, Nitrostat 0.4 mg sublingual every 5 minutes as needed for chest pain, hydroxyzine hydrochloride 10 mg 1 tablet b.i.d. for itching, famotidine 20 mg 1 tablet b.i.d., Effexor-XR 37.5 mg 1 capsule daily at nighttime, Humulin R 500 units/mL and to use 25 units subcutaneous once a day at supper, Drisdol 50,000 international units 1 capsule on the first Monday of the month, isosorbide mononitrate 60 mg 1 tablet daily, Lyrica 50 mg 1 capsule at bedtime, Protonix 40 mg 1 tablet b.i.d., Zyrtec 5 mg 1 tablet daily, Novolin R 100 units/mL use 80 units once a day and 35 every evening, Lipitor 80 mg 1 tablet daily, Colace orally daily, ropinirole 2 mg at bedtime, Levaquin 250 mg 1 tablet q. 48 hours, Vicodin 5/325 mg 1 to 2 tablets every 4 to 6 hours p.r.n. for severe to moderate pain, Tylenol 325 mg 2 tablets every 4 to 6 hours as needed for fever, heparin 5000 units q. 8 hours, insulin 100 units/mL subcutaneous p.r.n., aspart insulin 30 units subcutaneous, Flexeril 5 mg 1 tablet every 6 hours as needed for muscle spasm.     ____________________________ Savannah Key. Dedra Skeens, [place] jtm:at D: 11/04/2013 08:45:58 ET T: 11/04/2013 09:09:50 ET JOB#: [id number]  cc: J. Dedra Skeens, [place], <Dictator> J Alar[facility] PA ELECTRONICALLY SIGNED 11/06/2013 10:22

## 2014-05-12 NOTE — Op Note (Signed)
PATIENT NAME:  Savannah Key, PEROT MR#:  [id number] DATE OF BIRTH:  [birth year]  DATE OF PROCEDURE:  10/28/2013  PREOPERATIVE DIAGNOSIS: Acute compartmental syndrome, right leg, with closed right tibia-fibula fracture.   POSTOPERATIVE DIAGNOSIS: Acute compartmental syndrome, right leg, with closed right tibia-fibula fracture.   PROCEDURE PERFORMED: Right leg 4-compartment fasciotomy, splinting of right leg closed tibia-fibula fracture.   ANESTHESIA: General.   COMPLICATIONS: None apparent.   ESTIMATED BLOOD LOSS: 100 mL.   OPERATIVE FINDINGS: Completely viable muscle anterior, lateral, superficial, and deep posterior compartments. Ability to close medial wound completely. Ability to close approximately 60% of the lateral wound.  IMPLANTS: None.   MATERIALS TO LABORATORY: None.   INDICATIONS: Savannah Key is a 74 year old female who this evening sustained a fall with resultant tibia-fibula fracture. She had progressive pain, numbness, and tingling in her foot and elevated compartmental pressures in her anterior and deep posterior compartments of her right leg, indicating need for emergent fasciotomy. Risks, benefits, and alternatives were discussed with her to include, but not limited to, bleeding; infection; damage to blood vessels and nerves; need for further surgery and treatment; chronic pain; loss of function; stiffness; allergy; anesthetic risk; DVT; PE; heart, lung, brain, kidney complications; increased risk of infection. She appeared to understand these risks and benefits and desired to proceed with operative treatment.   DESCRIPTION OF PROCEDURE: After positive identification of the patient in the preoperative holding area, after informed consent had been obtained and the correct operative site had been confirmed by myself, the patient was taken to the operating room and placed in the supine position. General anesthesia was administered. The right leg was prepped and draped in the usual  sterile fashion. IV antibiotics were given before any skin incision had been made. Timeout was performed. Prescrub was performed on the right lower extremity. The anterior compartment was addressed first, given the highest pressures in this region including pressures of 34. A longitudinal incision was made coursing from the proximal portion of the leg, centered between the tibial crest and fibular shaft. This was extended distally. She did have transverse lacerations from prior incisions, one of which needed to be crossed at a 90-degree angle. Skin was undermined to expose the fascia. The superficial peroneal nerve was identified in its usual location over the distal leg and protected throughout the procedure. A standard fasciotomy using a scalpel and long Metzenbaum scissors was used, again protecting the superficial peroneal nerve. Muscle was viable in both the anterior and lateral compartments, including by visual assessment with color, capacity to bleed, and contractility.  Attention was then directed toward the medial leg, where a longitudinal incision was made approximately 1 cm posterior to the posteromedial tibial border. Incision was deepened to the saphenous neurovascular bundle. Branches of the saphenous vein were ligated as needed. The superficial posterior compartment was identified and released using a scalpel and Metzenbaum scissors, all under direct visualization. The soleus bridge was released over approximately 60% of its distal-most aspect to expose the underlying deep posterior compartment, which was then released under direct visualization using Metzenbaum scissors. Hemostasis was obtained. Both wounds were copiously irrigated with 3 L of normal saline. The medial wound could easily be closed. There was muscle bulge noted of both the anterior and deep posterior compartments, confirming compartmental syndrome. The medial wound was closed with 3-0 PDS and staples. The lateral wound was closed at  approximately 60% of the wound using, also, 3-0 Monocryl and staples. Superficial and posterior deep muscle musculature was also  contractile, with excellent capacity to bleed and color. 2+ dorsalis pedis pulses noted at the end of procedure. A short-leg sugar tong splint was placed with the foot in a plantigrade position. The patient was awakened and extubated in the operating room and taken to the recovery room in satisfactory condition without apparent operative site complications. These findings will be relayed to her family at a later time today, given the late hour at which we finished her surgery.   PLAN: She will be taken back to surgery in approximately to 48-72 hours for repeat I and D of the lateral wound as well as, most likely, IM nail fixation of her tibia-fibula fracture.  I will be signing the patient out to Dr. Ernest PineHooten late Sunday night or early Monday morning at approximately 4:00 Monday morning. These findings were related to the patient, as well as her need for further surgery. I will also contact her daughter, Harriett Sineancy.    ____________________________ Kyra SearlesJohn F. Pablo Mathurin, MD jfs:ST D: 10/28/2013 19:42:15 ET T: 10/28/2013 21:29:28 ET JOB#: [id number]  cc: Kyra SearlesJohn F. Liston Thum, MD, <Dictator> Kyra SearlesJOHN F Jonee Lamore MD ELECTRONICALLY SIGNED 10/29/2013 22:58

## 2014-05-12 NOTE — H&P (Signed)
PATIENT NAME:  Savannah Key, Savannah M MR#:  [id number] DATE OF BIRTH:  03/12/1940  DATE OF ADMISSION:  10/28/2013  REFERRING PHYSICIAN: Sharyn CreamerMark Quale, MD  PRIMARY CARE PHYSICIAN:  Lorre NickAnthony Allen, MD   CHIEF COMPLAINT: Leg pain.  HISTORY OF PRESENT ILLNESS: A 74 year old Caucasian female with past medical history of congestive heart failure, diastolic, last known ejection fraction of 60%, end-stage renal disease on hemodialysis Monday, Wednesday, Friday is presenting with leg pain. She suffered from a mechanical fall at home and developed acute right leg pain, described only as "pain rating 10 out of 10, nonradiating", worse with  movements. No relieving factors. Thus presented to the hospital for further work-up and evaluation. During the Emergency Room stay she was noticed to have increased swelling in the anterior compartment of the lower extremity with ecchymosis and edema. Orthopedic evaluated her in the ER and are concerned for compartment syndrome. They measured the pressures of the various compartments with the highest being 32, concerning for acute compartment syndrome. Currently, she is complaining only of right leg pain as described above; however, somewhat improved on medications. She is going to be taken to the OR for fasciotomy now.   REVIEW OF SYSTEMS:  CONSTITUTIONAL: Denies fever, fatigue, weakness.  EYES: Denies blurry vision, double vision or eye pain.  EARS, NOSE, THROAT: Denies tinnitus, ear pain or hearing loss.  RESPIRATORY: Denies cough, wheeze, shortness of breath.  CARDIOVASCULAR: Positive for dyspnea on exertion, which has been stable. Denies any chest pain, palpitations, edema, or orthopnea.  GASTROINTESTINAL: Denies any nausea, vomiting, diarrhea, abdominal pain.  GENITOURINARY: Denies dysuria, hematuria.  ENDOCRINE: Denies nocturia or thyroid problems.  HEMATOLOGIC AND LYMPHATIC: Denies easy bruising, bleeding.  SKIN: Denies rashes or lesions.  MUSCULOSKELETAL: Positive for  right leg pain as described above. Otherwise, denies any neck, back, shoulder or knee pain. Denies further arthritic symptoms.  NEUROLOGIC: Denies paralysis or paresthesias.  PSYCHIATRIC: Denies anxiety or depressive symptoms.   Otherwise, full review of systems performed by me is negative.   PAST MEDICAL HISTORY: End-stage renal disease on hemodialysis Monday, Wednesday, Friday secondary to complications of diabetes, hypertension, history of diabetes type 2 insulin-requiring, complicated by nephropathy and neuropathy, hypertension, hypothyroidism, gastroesophageal reflux disease, and diastolic congestive heart failure, last known ejection fraction of 60% stage II to III.   SOCIAL HISTORY: No alcohol, tobacco, or drug usage. Uses a walker for ambulation.   FAMILY HISTORY: Positive for coronary artery disease.   ALLERGIES: SULFA DRUGS.   HOME MEDICATIONS: Aspirin 81 mg p.o. q. daily, Imdur 60 mg p.o. q. daily, Nitrostat 0.4 mg sublingual every 5 minutes as needed for chest pain, Lyrica 50 mg p.o. at bedtime, Effexor 37.5 mg p.o. at bedtime, trazodone 50 mg p.o. at bedtime, Humulin 25 units subcutaneous at suppertime. Novolin 80 units in the morning and 35 units in the evening, , Lipitor 80 mg p.o. at bedtime, Requip 2 mg p.o. at bedtime, hydroxyzine 10 mg p.o. b.i.d., Pepcid 20 mg p.o. b.i.d., Colace 100 mg p.o. at bedtime, Protonix 40 mg p.o. b.i.d.,  levothyroxine 50 mcg p.o. q. daily, vitamin D3 50,000 international units p.o. q. monthly.   PHYSICAL EXAMINATION:  VITAL SIGNS: Temperature 98, heart rate 74, respirations 18, blood pressure 116/52, saturating 100% on room air. Weight 78 kg, BMI of 33.6.  GENERAL: Somewhat disheveled Caucasian female, currently in minimal distress given the leg pain.  HEAD: Normocephalic, atraumatic.  EYES: Pupils equal, round, reactive to light. Extraocular muscles intact. No scleral icterus.  MOUTH: Moist mucosal  membrane. Dentition intact. No abscess noted.   EARS, NOSE AND THROAT: Clear without exudates. No external lesions.  NECK: Supple. No thyromegaly. No nodules. No JVD.  PULMONARY: Clear to auscultation bilaterally without wheezes, rubs or rhonchi. No use of  accessory muscles. Good respiratory rate. CHEST: Nontender to palpation.  CARDIOVASCULAR: S1, S2, regular rate and rhythm. No murmurs, rubs, or gallops. No appreciable edema. Pedal pulses 2+ bilaterally.  EXTREMITIES: The right lower extremity currently wrapped and immobilized in preparation for the OR  GASTROINTESTINAL:  Soft, nontender, nondistended. No masses noted. Bowel sounds. No hepatosplenomegaly.  MUSCULOSKELETAL: Right lower extremity currently wrapped and immobilized, however,  prior to this a large area of ecchymosis as well as surrounding edema. Currently immobilized right lower extremity, otherwise range of motion full in all extremities.  NEUROLOGIC: Cranial nerves II through XII intact. No gross focal neurologic deficits. Sensation intact. Reflexes intact.  SKIN: No ulcerations, lesion, rash or cyanosis. Skin is warm and dry. Turgor intact.  PSYCHIATRIC:  Mood and affect within normal limits. Alert and oriented x 3. Insight and judgment intact.   LABORATORY DATA: EKG performed revealing normal sinus rhythm. No ST-T wave abnormalities. X-ray of the right tib fib area reveals displaced fracture of the mid shaft of the right tibia, spiral displaced fracture of the distal tibia, old fracture deformity of the distal fibula with a mild displaced fracture, midshaft of the right fibula also mention of disruption of ankle mortis with subtle widening of the medial tibiotalar space. This was followed by a CT of the right ankle revealing a spiral fracture to the distal tibia diaphysis with a few small osseous fragments arising at the distal tip   consistent with an avulsion fracture. Chest x-ray performed which reveals no acute cardiopulmonary process. Remainder of laboratory data: Sodium  141, potassium 3.9, chloride 100, bicarbonate 32, BUN 15, creatinine 2.17, glucose 316, CK 502. WBC 4.9, hemoglobin 9.6, platelets of 111,000. INR 1.1.   ASSESSMENT AND PLAN: A 74 year old Caucasian female with past medical history of diastolic congestive heart failure, stage II versus III somewhat limited by her everyday mobility,  ejection fraction of 60% as well as end-stage renal disease on hemodialysis Monday, Wednesday, Friday, which is secondary to complications of diabetes, hypertension, presenting with acute leg pain, found to have multiple fractures of the right tibia and fibula with concern for compartment syndrome.  1. Right tib-fib fracture with compartment syndrome. The case was discussed with orthopedics at bedside while the patient still remained in the Emergency Department. She is to go to the OR  now for fasciotomy as we felt elevated pressure in the anterior compartment of 32. with her diastolic blood pressure of 52, leaving her perfusion pressure less than 30. Post OR she should be stable enough to go to the orthopedic floor depending on how she does with her sedation and anesthesia. Regardless, post OR she will need adequate pain control as well as initiated on a  bowel regimen. The plan at this time is for delayed fixing of the fracture given her acute need for surgery now.   2. Preoperative evaluation. Given the urgency of the procedure of the fasciotomy, no further evaluation and testing required prior to surgery, regardless, even for the tibia fib fracture. She should be considered a moderate risk for moderate risk surgery. He does have cardiac risk factors, diastolic congestive heart failure, end stage renal disease on dialysis and insulin-requiring type 2 diabetes with METS less than 4 with generalized deconditioning requiring a walker.  Does have symptoms suggestive of congestive heart failure; however, these have been stable. She has no worsening symptoms concerning for active  congestive heart failure. No active chest pain, no active severe arrhythmias or valvular dysfunction. As far as medications are concerned, hold aspirin. Continue other medications.  3. Type 2 diabetes, insulin-requiring, complicated by nephropathy and neuropathy. Hold p.o. agents. Continue her 70/30 insulin; however, decrease doses to around half given her n.p.o. status for her surgery.  4. Gastroesophageal reflex disease.  Continue with PPI therapy.   5. End-stage renal disease on hemodialysis. Consult nephrology for continuation of dialysis.  6. Venous thromboembolism prophylaxis. Will defer to orthopedics, SCDs for now. Avoid any anticoagulation including heparin or Lovenox prior to surgery.   CODE STATUS: The patient is a full code.   TIME SPENT: 55 minutes.    ____________________________ Cletis Athens. Danel Requena, MD dkh:JT D: 10/28/2013 02:39:12 ET T: 10/28/2013 04:01:27 ET JOB#: [id number]  cc: Cletis Athens. Melody Cirrincione, MD, <Dictator> Domingos Riggi Synetta Shadow MD ELECTRONICALLY SIGNED 10/28/2013 20:35

## 2014-05-12 NOTE — H&P (Signed)
PATIENT NAME:  Savannah Key, Savannah Key MR#:  [id number] DATE OF BIRTH:  06-Feb-1940  DATE OF ADMISSION:  09/28/2013  REFERRING PHYSICIAN:  Dorothea Glassman, M.D.  PRIMARY CARE PHYSICIAN:  Dr. Freida Busman  CHIEF COMPLAINT: Weakness.   HISTORY OF PRESENT ILLNESS: A 74 year old Caucasian female with a history of end-stage renal disease on hemodialysis Monday, Wednesday, Friday secondary to diabetes and hypertension presenting with weakness. She describes a 3-day duration of diarrhea and decreased p.o. intake.  She described diarrhea, multiple watery bowel movements, approximately 5 to 7 bowel movements daily for the last 3 days with associated abdominal cramping located in the periumbilical region, 2 to 3 out of 10 in intensity, nonradiating; no worsening or relieving factors. Also describes having associated chills; however, denies any fevers.  According to the patient, she has not had a bowel movement the last 8 hours or so while being in the hospital.  I talked to her about being discharged home. She then stated that she was too weak to ambulate and was unsafe to go home.   REVIEW OF SYSTEMS:    CONSTITUTIONAL: Denies fever. Positive for chills, fatigue, weakness.  EYES: Denies blurred vision, double vision, eye pain.  HEENT: Denies tinnitus, ear pain, hearing loss.  RESPIRATORY: Denies cough, wheeze, shortness of breath.  CARDIOVASCULAR: Denies chest pain, palpitations, edema.  GASTROINTESTINAL: Positive for diarrhea, abdominal cramping as described above.  Denies nausea or vomiting.   GENITOURINARY: Denies dysuria or hematuria.  ENDOCRINE: Denies nocturia or thyroid problems. HEMATOLOGIC AND LYMPHATIC: Denies easy bruising or bleeding. SKIN: Denies rashes or lesions.  MUSCULOSKELETAL: Denies pain in neck, back, shoulder, knees, hips, or arthritic symptoms.  Positive for right-sided ankle pain after a fall about a week ago, however, without difficulty ambulating.   NEUROLOGIC: Denies paralysis, paresthesias.   PSYCHIATRIC: Denies any anxiety or depressive symptoms.  Otherwise, full review of systems performed and is negative.   PAST MEDICAL HISTORY: End-stage renal disease on hemodialysis secondary to complications of diabetes, hypertension, hypothyroidism, type II diabetes, insulin requiring with nephropathy and neuropathy, gastroesophageal reflux disease, diastolic congestive heart failure, last known ejection fraction was 60%.    SOCIAL HISTORY:  Denies alcohol, tobacco, or drug usage.  FAMILY HISTORY:  Positive for heart disease as well as kidney disease.  ALLERGIES:  SULFA DRUGS.  HOME MEDICATIONS:  Include: 1.  Aspirin 81 mg p.o. daily. 2.  Imdur 60 mg p.o. daily. 3.  Nitrostat 0.4 mg sublingually every 5 minutes as needed for chest pain. 4.  Lyrica 50 mg p.o. q.h.s. 5.  Effexor 37.5 mg p.o. q.h.s. 6.  Trazodone 50 mg p.o. q.h.s. 7.  Novolin 80 units in the morning and 35 units in the evening. 8.  Cetirizine 5 mg p.o. at bedtime. 9.  Lipitor 80 mg p.o. q.h.s. 10. Requip 1.5 mg p.o. q.h.s. 11. Hydroxyzine 10 mg p.o. b.i.d. 12. Famotidine 20 mg p.o. b.i.d. 13. Protonix 40 mg p.o. b.i.d. 14. Levothyroxine 50 mcg p.o. daily. 15. Vitamin D 50,000 international units q. monthly.  PHYSICAL EXAMINATION:  VITAL SIGNS: Temperature 98.3, heart rate 72, respirations 18, blood pressure 143/76, saturating 100% on room air, weight 81.2 kg, BMI 35.   GENERAL: Weak appearing Caucasian female appearing in no acute distress.  HEAD: Normocephalic, atraumatic.  EYES: Pupils equal, round, reactive to light. Extraocular muscles intact. No scleral icterus. Mouth:  Dry mucous membranes. Dentition intact. No abscess noted.  EARS, NOSE, AND THROAT: Clear, without exudates. No external lesions.  NECK: Supple. No thyromegaly. No nodules. No JVD.  PULMONARY: Clear to auscultation bilaterally without wheezes, rales, or rhonchi.  No use of accessory muscles. Good respiratory rate.  CHEST: Nontender to  palpation.  CARDIOVASCULAR: S1, S2, regular rate and rhythm.  No murmurs, rubs, or gallops. No edema. Pedal pulses 2+ bilaterally. GASTROINTESTINAL: Soft, nontender, nondistended. No masses. Positive bowel sounds. No hepatosplenomegaly.  MUSCULOSKELETAL: No swelling, clubbing, or edema. Range of motion full in all extremities.  She has mild edema around the right ankle without point tenderness over any bony prominence.  NEUROLOGIC: Cranial nerves II through XII intact. No gross focal neurological deficits. Sensation intact. Reflexes intact.   SKIN: No ulcerations, lesions, rashes, or cyanosis. Skin warm, dry. Turgor intact.  PSYCHIATRIC: Mood and affect blunted.  She is awake, alert, oriented x 3. Insight and judgment intact.   LABORATORY DATA: Sodium 139, potassium 3.8, chloride 103, bicarbonate 25, BUN 30, creatinine 2.8, glucose 353. LFTs: Albumin 3.1, alkaline phosphatase 166, AST 39; otherwise within normal limits.  WBCs 6, hemoglobin 13.2, platelets of 122,000. Urinalysis:  Epithelial cells 23, WBCs 8, RBCs 9, leukocyte esterase 3+.   ASSESSMENT AND PLAN:  611.  A 74 year old Caucasian female with history of end-stage renal disease on dialysis secondary to diabetes as well as hypertension, presenting with weakness after having multiple days of diarrhea as well as decreased p.o. intake and generalized weakness secondary to diarrhea and poor p.o. intake.  Provide gentle intravenous fluid hydration as she is a dialysis patient and get a physical therapy evaluation before discharge home.    2.  Diarrhea, likely viral etiology. She actually has had no further episodes since being in the hospital which is about 8 to 9 hours in total. Otherwise, we will provide as needed medications.   3.  Type 2 diabetes insulin requiring, complicated by nephropathy and neuropathy. Continue with home dose of Novolin.  Add insulin sliding scale with q. 6 hours Accu-Cheks.  4.  End-stage renal disease, on hemodialysis.  We will consult nephrology for continuation of  hemodialysis.   Venous thromboembolism prophylaxis with heparin subcutaneous.   CODE STATUS: Patient is full code.   TIME SPENT: 45 minutes.    ____________________________ Cletis Athensavid K. Hower, MD dkh:nr D: 09/28/2013 21:55:16 ET T: 09/28/2013 22:42:54 ET JOB#: [id number]  cc: Cletis Athensavid K. Hower, MD, <Dictator> Dorothea GlassmanPaul Malinda, MD Cato MulliganAlex F. Lyn HollingsheadAlexander, DDS  DAVID Synetta ShadowK HOWER MD ELECTRONICALLY SIGNED 09/29/2013 2:02

## 2014-05-12 NOTE — Discharge Summary (Signed)
PATIENT NAME:  [name], Arpi M MR#:  [id number] DATE OF BIRTH:  03-Jun-1940  DATE OF ADMISSION:  09/28/2013 DATE OF DISCHARGE:  09/30/2013  DISCHARGE DIAGNOSES: 1.  Diarrhea. 2.  Urinary tract infection.  3.  Generalized weakness.  4.  End-stage renal disease, on hemodialysis.  5.  Diabetes mellitus.  6.  Hypothyroidism.   CONSULTATIONS: Laverda SorensonSarath C. Kolluru, MD, nephrology   PROCEDURES: None.   HISTORY OF PRESENT ILLNESS: This 74 year old female with a history of end-stage renal disease, on hemodialysis Monday, Wednesday, and Friday, diabetes, hypertension, presents with weakness. She describes a 3-day duration of diarrhea and decreased p.o. intake. She describes multiple watery bowel movements, 5-7 a day, for the past 3 days with abdominal cramping. She also describes chills, no fevers. According to the patient she had not had a bowel movement within 8 hours of presentation to the hospital.   HOSPITAL COURSE BY PROBLEM:  1.  Diarrhea:  Resolved. C. difficile negative. The patient took Imodium prior to presentation at the hospital and had no further episodes of diarrhea. She had one formed stool during her admission.  2.  Possible urinary tract infection: The patient reported dysuria and urinalysis showed a weak positive for urinary tract infection with 8 white blood cells per high-powered field and very positive leukocyte esterase. She was started on Levaquin. The dose was adjusted due to renal failure to 250 mg every 48 hours. She will need one dose on September 13 and one on September 15, and then stop. Would recommend a probiotic after this course of antibiotics given her recent diarrhea.  3.  Generalized weakness: The patient was evaluated by physical therapy. Inpatient rehabilitation was recommended; however, the patient would like to go home. She reports that she has in-home equipment and 24-hour care. She is also a member of the [facility] and [facility] on Tuesdays and Thursdays. This has been discussed with the [facility] program and she will have ongoing physical therapy through them.  4.  End-stage renal disease: The patient received dialysis during her hospitalization and will continue to do so in the outpatient setting.  5.  Diabetes mellitus: No changes to the home regimen were made.  6.  Hypothyroidism: No changes were made to her dose of levothyroxine.   DISCHARGE PHYSICAL EXAMINATION: VITAL SIGNS: Temperature 98.1, pulse 74, respirations 16, blood pressure 101/65, oxygenation 97% on room air.  GENERAL: No acute distress, resting comfortably in the bed.  HEENT: Pink conjunctivae. Pupils equal, round, and reactive to light.  NECK: Supple, no mass, no thyroid tenderness.  RESPIRATORY: Normal respiratory effort. Clear breath sounds with good air movement.  CARDIOVASCULAR: Regular rate and rhythm. No murmurs, rubs, or gallops. No lower extremity edema.  ABDOMEN: Soft, nontender, nondistended. No guarding, no rebound.  SKIN: Normal to palpation. No rash, no ulcers.  NEUROLOGIC: Cranial nerves II to XII are grossly intact. Strength and sensation are normal.  PSYCHIATRIC: The patient is alert and oriented to time and place with good insight.   LABORATORY DATA: Sodium 141, potassium 3.6, chloride 108, bicarbonate 22, BUN 33, creatinine 2.7, hemoglobin 13.2, white blood cells 6.0, platelets 122,000, MCV 85. C. difficile negative. Urine culture shows mixed bacterial organisms suggestive of contamination.   CONDITION ON DISCHARGE: Stable.   DISPOSITION: The patient is discharged to home with 24-hour care by her family. Follow up at hemodialysis and also with the [facility].   DISCHARGE MEDICATIONS:  1.  Levothyroxine 50 mcg 1 tablet once a day.  2.  Trazodone 50 mg 1 tablet once a day.  3.  Nitrostat 0.4 mg sublingual tablet 1 tablet every 5 minutes as needed for chest pain.  4.  Hydroxyzine hydrochloride 10 mg 1 tablet  2 times a day as needed for itching.  5.  Famotidine 20 mg 1 tablet twice a day.  6.  Effexor extended release 37.5 mg oral capsule 1 capsule once a day at bedtime.  7.  Humulin R 500 units/mL subcutaneous solution 25 units once a day at bedtime.  8.  Drisdol 50,000 units oral capsule 1 capsule once a month on the 1st Monday of the month.  9.  Aspirin enteric coated 81 mg oral 1 capsule once a day.  10.  Isosorbide mononitrate 60 mg 1 tablet once a day for angina.  11.  Lyrica 50 mg 1 capsule once a day for leg pain.  12.  Protonix 40 mg 1 tablet twice a day for reflux.  13.  Cetirizine 5 mg 1 tablet once a day.  14.  Novolin R 80 units once a day in the morning and 35 units every evening.  15.  Lipitor 80 mg 1 tablet once a day for cholesterol.  16.  Colace 100 mg once a day at bedtime.  17.  Ropinirole 2 mg orally once a day at bedtime.  18.  Levofloxacin 250 mg 1 tablet every 48 hours on September 13 and September 15, and then stop.   DISCHARGE INSTRUCTIONS: Please follow up with hemodialysis and the [facility].   DIET: Renal diet.   ACTIVITY: As tolerated.   TIME SPENT ON DISCHARGE: 45 minutes.     ____________________________ Ena Dawley. Clent Ridges, MD cpw:ts D: 10/01/2013 15:22:09 ET T: 10/01/2013 16:51:33 ET JOB#: [id number]  cc: Santina Evans P. Clent Ridges, MD, <Dictator> Gale Journey MD ELECTRONICALLY SIGNED 10/04/2013 11:10

## 2014-05-12 NOTE — Op Note (Signed)
PATIENT NAME:  Savannah Key, [name] MR#:  [id number] DATE OF BIRTH:  Apr 23, 1940  DATE OF PROCEDURE:  10/28/2013  PREOPERATIVE DIAGNOSIS: Right leg tibia-fibula fracture, with probable acute compartmental syndrome.  POSTOPERATIVE DIAGNOSIS: Right leg closed tibia-fibula fracture, midshaft, with acute compartmental syndrome.   PROCEDURE PERFORMED:  Four-compartment pressure assessment right leg.   SURGEON: Kyra SearlesJohn F Jehad Bisono, MD.   ASSISTANTS:  None.   ANESTHESIA: IV narcotics.   ESTIMATED BLOOD LOSS: Minimal, less than 5 mL.   OPERATIVE FINDINGS:  Compartment pressure assessment 32, anterior lateral compartment pressure assessment 19,  superficial posterior compartment pressure 9, and deep posterior compartment pressure 24, all done with a blood pressure of 130/52.   INDICATIONS: Savannah Key is a 74 year old female who sustained a ground-level fall with resultant displaced tibia fracture and worsening pain in the Emergency Department despite IV narcotic management and numbness complaints in her right foot elevating concerns for acute compartmental syndrome.   PHYSICAL EXAMINATION: Suggestive of probable compartmental syndrome, and as such compartment pressure assessment was indicated.  The risks, benefits and alternatives were discussed with her including, damage to blood vessels, nerves and infection, especially given her prior history. She appeared to understand these risks and benefits and desired to proceed with operative treatment.   DESCRIPTION OF PROCEDURE: After positive identification of the of the patient and after confirmation of the correct procedural site and informed consent had been obtained, the anterior and lateral aspects as well as the posteromedial aspects of the right leg were prepped with iodine solution. Blood pressure was assessed at 130/52.  Anterior lateral and then superficial and deep posterior compartment pressure assessments were made with the Stryker compartmental pressure  device. Pressures were recorded and the patient tolerated the procedure well.   IMPRESSION: Right leg acute compartmental syndrome.   PLAN:  Emergent fasciotomy right leg.    ____________________________ Kyra SearlesJohn F. Edelyn Heidel, MD jfs:lt D: 10/28/2013 03:00:03 ET T: 10/28/2013 08:25:36 ET JOB#: [id number]  cc: Kyra SearlesJohn F. Johnmark Geiger, MD, <Dictator> Kyra SearlesJOHN F Tramell Piechota MD ELECTRONICALLY SIGNED 10/29/2013 22:58

## 2014-05-12 NOTE — Consult Note (Signed)
PATIENT NAME:  Savannah Key, Savannah Key MR#:  [id number] DATE OF BIRTH:  Feb 09, 1940  DATE OF CONSULTATION:  10/28/2013  REFERRING PHYSICIAN:  Emergency Department  CONSULTING PHYSICIAN:  Kyra SearlesJohn F. Mozes Sagar, MD   CHIEF COMPLAINT: Right leg pain.   HISTORY OF PRESENT ILLNESS: Savannah Key is a 74 year old female who was walking in her home this evening and tripped, sustaining a twisting injury to her right lower extremity. She had immediate pain, deformity and inability to bear weight on the right lower extremity. She was brought to [facility] Emergency Department where she was diagnosed with a right tibia fracture, closed.   She also had progressive complaints of pain, as well as cramping in the right leg with increased her pain during her ED course.   I was consulted for a possible compartmental syndrome of the right leg.   The patient complains of sharp, near constant pain with cramping intermittent pain in the right leg, as well as numbness in the right foot.   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   PAST SURGICAL HISTORY: Remarkable for right leg laceration, shunt placement for hemodialysis and coronary artery stent x 2.   ALLERGIES: CURRENT ALLERGIES INCLUDE SULFA.   CURRENT MEDICATIONS INCLUDE: Aspirin, cetirizine, Colace, Drisdol, Effexor, famotidine, Humulin, hydroxyzine, isosorbide mononitrate, levofloxacin, levothyroxine, Lipitor, Lyrica, Nitrostat, Novolin, Protonix, ropinirole, and trazodone.   SOCIAL HISTORY: The patient currently lives at home with her family.   FAMILY HISTORY: Noncontributory.   SOCIAL HISTORY: The patient does not work. She is dialysis dependent with her chronic kidney disease. She denies any alcohol or tobacco use.   PHYSICAL EXAMINATION:   GENERAL: Pleasant, alert, elderly female.  PSYCHIATRIC: Mood and affect appropriate.  HEENT: Normocephalic, atraumatic. Sclerae clear. Oral mucosa membranes are slightly dry.  HEART: Regular rate and rhythm. Normal S1, S2. No obvious murmurs.  LUNGS: Clear to auscultation bilaterally.  ABDOMEN: Soft, nontender, nondistended. Positive bowel sounds.  SKIN: Skin is warm and intact over the right leg.  VASCULAR EXAMINATION: There are 2+ dorsalis pedis and posterior tibialis pulses.  LYMPHATIC: Moderate swelling, right leg.  MUSCULOSKELETAL: Bilateral upper extremities and left lower extremity nontender to palpation without deformity. Evaluation of the right lower extremity demonstrates moderate tenderness to palpation with moderate swelling over the right mid tibia. The posterior aspect of the leg has moderate swelling as well. There is pain with passive extension and flexion of the large toe, as well as pain with passive flexion and extension at the ankle; they are difficult to assess secondary to the fracture location.  NEUROLOGIC: At the foot demonstrates decreased light touch sensation compared to the left foot in all distributions,  including the deep and superficial peroneal, saphenous, sural, lateral and medial plantar branches. She states that her right foot sensation is usually better compared to her left foot sensation with her diabetic neuropathy. The anterior compartment and lateral compartment are firm to palpation.  Motor examination is intact at the right foot with toe flexion and extension. Ankle flexion and extension elicits pain.   PROCEDURE NOTE: For compartment pressure assessment to be dictated on a separate operative note.  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 The spiral  fracture appears to exit in the supramalleolar area of the tibia.   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   Compartment pressure assessment of the right leg demonstrated anterior compartment pressure of 32, lateral compartment pressure of 19, superficial compartment pressure 9 and deep posterior 24, all with a blood pressure of 130/52.   IMPRESSION:  1.  Right leg tibia-fibula fracture.  2.  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  3.  Acute compartment syndrome, right leg and history of multiple medical problems, including coronary artery disease, chronic kidney disease and diabetes mellitus.   PLAN: We will proceed with emergent fasciotomy of the right leg. The risks, benefits, and alternatives were discussed with the patient to include, but not limited to bleeding, infection, damage to blood vessels and nerves, need for further surgery and treatment, chronic pain, loss of function, stiffness, allergy, anesthetic risk, DVT, PE, heart, lung, brain, kidney complications, eventual need for fracture fixation most likely, including probable IM rod, possible need for fixation at the ankle as well, significantly increased risk of infection given history of diabetes and hemodialysis need for her chronic kidney disease. She understands that we will just splint her fracture tonight and proceed with fracture fixation once her soft tissue envelope is more appropriate for surgery for definitive fracture fixation. She appeared to understand the risks and benefits, and desired to proceed with surgical treatment.    ____________________________ Kyra Searles, MD jfs:MT D: 10/28/2013 02:57:19 ET T: 10/28/2013 05:23:54 ET JOB#: [id number]  cc: Kyra Searles, MD, <Dictator> Kyra Searles MD ELECTRONICALLY SIGNED 10/29/2013 22:57

## 2014-05-12 NOTE — H&P (Signed)
PATIENT NAME:  Savannah Key, Savannah Key MR#:  [id number] DATE OF BIRTH:  [birth year]  DATE OF ADMISSION:  03/06/2013  REFERRING PHYSICIAN: Dr. Shaune Pollack.   PRIMARY CARE PHYSICIAN: Dr. Freida Busman.   CHIEF COMPLAINT: Dizziness.   A 74 year old Caucasian female with history of end-stage renal disease on hemodialysis of approximately 2 years' duration, hypertension, type 2 diabetes, insulin-requiring, presenting with dizziness. She describes dizziness for 1 week duration, which is mainly described as double vision with associated headache, frontal, described as throbbing, intensity 4 to 5 out of 10 and intermittent at best. No worsening or relieving factors. She has double vision but no blurred vision. No eye pain. She has also had poor coordination in the past couple of days requiring her to use a walker for ambulation. Currently without complaints.   REVIEW OF SYSTEMS:    CONSTITUTIONAL: Denies fever, fatigue, weakness.  EYES: Denies blurred vision. Positive for double vision. Denies eye pain.  EARS, NOSE, THROAT: Denies tinnitus, ear pain, hearing loss.  RESPIRATORY: Denies cough, wheeze, shortness of breath.  CARDIOVASCULAR: Denies chest pain, palpitations, edema. GASTROINTESTINAL:  Denies nausea, vomiting, diarrhea or abdominal pain.  GENITOURINARY: Denies dysuria or hematuria.  ENDOCRINE: Denies nocturia or thyroid problems.  HEMATOLOGIC AND LYMPHATICS: Denies easy bruising or bleeding.  SKIN: Denies rashes or lesions.  MUSCULOSKELETAL: Denies pain in neck, back, shoulder, knees, hips, arthritic symptoms.  NEUROLOGIC: Denies paralysis, paresthesias, weakness, dysarthria. Positive for headache.  PSYCHIATRIC: Denies anxiety or depressive symptoms. Otherwise, full review of systems performed by me is negative.   PAST MEDICAL HISTORY: Hypothyroidism, end-stage renal disease on hemodialysis Monday, Wednesday, Friday, type 2 diabetes, insulin-requiring; depression, anxiety, not otherwise specified; gastroesophageal  reflux disease, hypertension, congestive heart failure of unknown ejection fraction.   SOCIAL HISTORY: Denies alcohol, tobacco or drug usage.   FAMILY HISTORY: Positive for kidney disease, as well as heart disease.   ALLERGIES: SULFA DRUGS.   HOME MEDICATIONS: Include aspirin 81 mg p.o. daily, Imdur 60 mg p.o. daily, Nitrostat 0.4 mg sublingual every 5 minutes as needed for chest pain, Lyrica 50 mg p.o. at bedtime, Effexor 37.5 mg p.o. at bedtime, trazodone 50 mg p.o. at bedtime, Humulin 25 units subcutaneous at supper, Novolin 80 units in the morning, 35 in the evening; cetirizine 5 mg p.o. at bedtime, Lipitor 80 mg p.o. at bedtime, Rohypnol 1 mg p.o. at bedtime, hydroxyzine 10 mg p.o. b.i.d. as needed for itching, famotidine 20 mg p.o. b.i.d., Protonix 40 mg p.o. b.i.d., levothyroxine 50 mcg p.o. daily, vitamin D 50,000 International Units p.o. monthly.   PHYSICAL EXAMINATION: VITAL SIGNS: Temperature 98.5, heart rate 80, respirations 18, blood pressure 110/53, saturating 93% on room air. Weight 77.6 kg. BMI 33.4.  GENERAL: Well-nourished, obese Caucasian female, currently in no acute distress.  HEAD: Normocephalic, atraumatic.  EYES: Pupils equal, round and reactive to light. Extraocular muscles intact. No scleral icterus. No nystagmus.  MOUTH: Moist mucosal membranes. Dentition poor. No abscesses noted.  EAR, NOSE, THROAT:  Clear without exudates.  No external lesions. NECK: Supple. No thyromegaly. No nodules. No JVD.  PULMONARY: Clear to auscultation bilaterally without wheeze, rales, rhonchi. No use of accessory muscles. Good respiratory effort.  CHEST: Nontender to palpation.  CARDIOVASCULAR: S1, S2, regular rate and rhythm. No murmurs, rubs or gallops. No edema.  Pedal pulses 2+ bilaterally.   GASTROINTESTINAL:  Soft, nontender, nondistended. No masses. Positive bowel sounds. No hepatosplenomegaly.  MUSCULOSKELETAL: No swelling, clubbing or edema.  Range of motion full in all  extremities.  NEUROLOGICAL: Cranial nerves II  through XII intact. Strength 5/5 in all extremities, including flexors and extensors, both proximal and distal muscle groups. Sensation intact. Reflexes intact. Pronator drift within normal limits. Dysdiadochokinesis: Somewhat slowed on right upper extremity. Finger-to-nose: Mild past pointing with intention tremor most prominent on the right hand.  SKIN: No ulcerations, lesions, rashes, cyanosis. Skin warm, dry. Turgor intact.  PSYCHIATRIC: Mood and affect within normal limits.  The patient is awake, alert and oriented x 3. Insight and judgement intact   LABORATORY, DIAGNOSTIC AND RADIOLOGIC DATA: CT head performed, no acute intracranial process. Sodium 139, potassium 3.6, chloride 103, bicarb 31, BUN 9, creatinine 1.64, glucose 177, troponin I less than 0.02.  WBC 5.8, hemoglobin 10.8, platelets 102. Urinalysis negative for evidence of infection.   ASSESSMENT AND PLAN: A 74 year old Caucasian female with history of end-stage renal disease on dialysis presenting with dizziness and found to have ataxia.  1.  Ataxia, cerebrovascular accident. Admit to observation telemetry Neuro checks q.4 hours. Provide aspirin and statin therapy. We will check an MRI, transthoracic echocardiogram, carotid Dopplers and lipid panel.  2.  Hypertension. Continue with Imdur.  3.  Hypothyroidism. Synthroid.  4.  Diabetes, insulin sliding scale.  5.  Venous thromboembolism prophylaxis with sequential compression devices.  6.  End-stage renal disease on hemodialysis. Do not anticipate her staying to her next dialysis day. If required, we will consult nephrology for continuation of dialysis.    The patient is FULL CODE.   TIME SPENT: 45 minutes.     ____________________________ Cletis Athensavid K. Hower, MD dkh:dmm D: 03/06/2013 20:32:42 ET T: 03/06/2013 20:48:38 ET JOB#: [id number]  cc: Cletis Athensavid K. Hower, MD, <Dictator> DAVID Synetta ShadowK HOWER MD ELECTRONICALLY SIGNED 03/07/2013 2:04

## 2014-05-12 NOTE — Op Note (Signed)
PATIENT NAME:  Savannah Key, Savannah Key MR#:  [id number] DATE OF BIRTH:  Nov 12, 1940  DATE OF PROCEDURE:  10/30/2013  PREOPERATIVE DIAGNOSIS:  Right tibial shaft fracture, status post fasciotomy, four-compartment syndrome.   POSTOPERATIVE DIAGNOSIS:  Right tibial shaft fracture, status post fasciotomy, four-compartment syndrome.   PROCEDURE PERFORMED:  Irrigation, sharp excisional debridement, and closure of lateral fasciotomy to the right lower leg.   SURGEON:  Illene LabradorJames P. Angie FavaHooten Jr., MD   ANESTHESIA:  General.   ESTIMATED BLOOD LOSS:  Minimal.   FLUIDS REPLACED:  500 mL.   DRAINS:  None.   INDICATIONS FOR SURGERY:  The patient is a 74 year old female with a history of end-stage renal disease and diabetes, who fell 2 days ago and sustained a tibial shaft fracture. Upon presentation to the Emergency Department, she had severe swelling to the right lower leg, and compartment pressures were measured and found to be consistent with compartment syndrome. Dr. Astrid DraftsSloboda performed four-compartment fasciotomy. Due to the degree of swelling, the lateral fasciotomy could not be closed at that time. Recommendation was made for return to the operating room for irrigation, debridement, and an attempt at closure of the lateral fasciotomy site with subsequent stage procedure for internal fixation of the tibial shaft fracture. Risks and benefits of surgical intervention were discussed with the patient. She expressed understanding of the risks, benefits, and agreed with plans for surgical intervention.   PROCEDURE IN DETAIL:  The patient was taken to the operating room, and after adequate general anesthesia was achieved, a tourniquet was placed on the patient's upper right thigh. The patient's right lower leg was cleaned and prepped with Betadine and draped in the usual sterile fashion. A "timeout" was performed as per usual protocol. The medial fasciotomy incision was intact with staples in place. Lateral fasciotomy incision  was partially closed, but there remained a 14 cm extent that was held together with retention-type sutures. The vessel loop and associated staples were removed. The Xeroform gauze was removed. Inspection of the tissue showed the muscle tissue to be in good condition and good viability noted. Sharp excisional debridement of the wound edges was performed using Metzenbaum scissors. This allowed for freshening of the site. The wound was then irrigated with 3 liters of normal saline with antibiotic solution. Good hemostasis was appreciated. The wound edges were then reapproximated using interrupted sutures of #3-0 Monocryl. The skin edges were then approximated using skin staples. There was little tension on the tissue. Sterile dressing was applied followed by application of a posterior and stirrup splint.   The patient tolerated the procedure well. She was transported to the recovery room in stable condition.   ____________________________ Illene LabradorJames P. Angie FavaHooten Jr., MD jph:nb D: 10/31/2013 00:36:45 ET T: 10/31/2013 02:07:58 ET JOB#: [id number]  cc: Illene LabradorJames P. Angie FavaHooten Jr., MD, <Dictator> JAMES P Angie FavaHOOTEN JR MD ELECTRONICALLY SIGNED 11/01/2013 6:51

## 2014-05-12 NOTE — Discharge Summary (Signed)
PATIENT NAME:  Savannah Key, [name] MR#:  [id number] DATE OF BIRTH:  1940-12-07  DATE OF ADMISSION:  03/07/2013 DATE OF DISCHARGE:  03/07/2013  DISCHARGE DIAGNOSES:   1.  Double vision, likely transient ischemic attack. 2.  Urinary tract infection.  3.  Hypertension.  4.  End stage renal dialysis, on hemodialysis.  5.  Diabetes mellitus type 2.   DISCHARGE MEDICATIONS:  1.  Levothyroxine 50 mcg by mouth daily.  2.  Trazodone 50 mg by mouth at bedtime.  3.  Hydroxyzine hydrochloride 10 mg by mouth twice daily.  4.  Effexor XL 37.5 mg by mouth daily.  5.  Humulin recombinant insulin 500 units per mL, take 25 units at bedtime ( Novolin 80 units in the morning, 35 in the evening;   vitamin D 50,000 units once a month.  7.  Aspirin 81 mg daily.  8.  Imdur 60 mg by mouth daily.  9.  Lyrica 50 mg by mouth daily.  10.  Protonix 40 mg by mouth twice daily.  11.cetrizine  5 mg po  by mouth daily.  12.  Novolin 100 units per mL, 80 units once a day in the morning and 35 at night.  13.   requip  1.5 mg  po at bedtime.  14.  Lipitor 80 mg by mouth daily.  15.  Augmentin 875 mg by mouth twice daily for UTI.  Augmentin is given for 10 days.   CONSULTATIONS:  None.   HOSPITAL COURSE:  The patient is a 74 year old female, the patient brought in because of dizziness and double vision and the patient has a history of ESRD on hemodialysis for two years, history of insulin-requiring diabetes came in, developed dizziness for about a week.  Look at the history and physical for full details.  On admission vitals were stable.  The patient admitted for observation status.  The patient's neurologic exam also within normal limits.  [facility]  CT head was unremarkable.  The patient was made on observation status for possible TIA.  She was already on aspirin and statin so we continued that.  We checked neuro checks q. 4 hours.  The patient neurologically remained very stable.  Did have  double vision.  [facility], recommended outpatient followup for diabetes.  The patient's brain MRI did not show any new strokes.  It showed only atrophy, had a carotid  ultrasound showing for 59% plaque. carotid sono showed  Thelaque formation at the carotid bifurcations bilaterally.  Velocity measurements the left ICA correspond to a 50-69% diameter stenosis.  Right ICA velocity is minimally elevated though the systolic ICA:CCA ratio is normal.  However in combination with grayscale imaging, which shows significant luminal narrowing at the proximal right ICA, right side findings findings are most consistent with a 50-69% diameter stenosis.    I spoke with Dr. Wynelle LinkKolluru.  He suggested MRA without contrast for carotid and carotid MRA showed 50% stenosis of the proximal internal carotid arteries bilaterally  and no hemodynimically significant stenosis.Echo showed Ef   of 60% . and the patient's brain MRI did not show any strokes, so she went home in stable condition.  Advised her to follow up with her primary doctor.  EKG showed normal sinus rhythm only.    The patient had some UTI symptoms and UA was positive for 2+ bacteria and 11 WBC.  SHE IS ALLERGIC TO SULFA, discharged with Augmentin.    Discharge vital signs:  Heart rate 82, blood pressure is 115/64, sats 96% on room air, temp 97.9.   The patient went home in stable condition.  The patient will follow up with primary doctor Dr. Shawn Stall from [facility].   TIME SPENT:  More than 30 minutes.    ____________________________ Katha Hamming, MD sk:ea D: 03/09/2013 21:46:00 ET T: 03/10/2013 01:58:43 ET JOB#: [id number]  cc: Katha Hamming, MD, <Dictator> Katha Hamming MD ELECTRONICALLY SIGNED 03/16/2013 12:10

## 2014-05-12 NOTE — Op Note (Signed)
PATIENT NAME:  Savannah Key, Savannah Key MR#:  [id number] DATE OF BIRTH:  03-11-1940  DATE OF PROCEDURE:  11/01/2013   PREOPERATIVE DIAGNOSIS: Comminuted right tibial shaft fracture.   POSTOPERATIVE DIAGNOSIS:  Comminuted right tibial shaft fracture.  PROCEDURE PERFORMED: Reduction and intramedullary nailing of right tibial shaft fracture.   SURGEON: Francesco SorJames Hooten, MD   ANESTHESIA: General.   ESTIMATED BLOOD LOSS: 100 mL.   FLUIDS REPLACED: 650 mL of crystalloid and 1 unit of packed red blood cells.   TOURNIQUET TIME: Not used.   DRAINS: None.   IMPLANTS UTILIZED: Synthes 12 mm x 285 mm cannulated titanium tibial nail, four 5.0 mm locking screws.   INDICATIONS FOR SURGERY:  The patient is a 74 year old female with end-stage renal disease, uncontrolled diabetes, and history of congestive heart failure who fell on 10/27/2013, and sustained a comminuted tibial shaft fracture.  She underwent 4 compartment fasciotomies at that time as per Dr. Astrid DraftsSloboda for presumed compartment syndrome.  Swelling decreased significantly and closure of the fasciotomy site had been performed 2 days ago. The patient received dialysis on the day of surgery. Risks and benefits of surgical intervention were discussed in detail with the patient.  She expressed understanding of the risks and benefits, and agreed with plans for surgical intervention.   PROCEDURE IN DETAIL:  The patient was brought to the Operating Room and, after adequate general anesthesia was achieved, a tourniquet was placed on the patient's right thigh. The patient's right leg was cleaned and prepped with DuraPrep and draped in the usual sterile fashion. A "timeout" was performed as per usual protocol.  An anterior incision was made along the medial aspect of the patellar tendon.  Dissection was carried down medial to the tendon to the leading edge of the tibial plateau.  A distally threaded guide pin was provisionally placed along the anterior edge of the tibial  plateau and position was confirmed in both AP and lateral planes using the C-arm. This was advanced into the intramedullary canal.  The opening was enlarged using a step drill.  The initial guidewire was exchanged for a beaded reaming guidewire. This was advanced down the intramedullary canal and then passed across the fracture site into the canal and the distal fragment.  Position was confirmed in both AP and lateral planes using the C-arm. Reaming of the canal was performed in a serial fashion up to a 13 mm diameter. Measurements were obtained and it was felt that a 285 mm long tibial nail was appropriate.  A 12 mm x 285 mm Synthes cannulated tibial nail was then advanced over the guidewire past the fracture site.  Good position was noted in both AP and lateral planes using the C-arm.  Guidewire was removed.  C-arm was positioned so as to visualize the distal locking screws.  Due to the fracture pattern, it was elected to lock the distal 2 holes. Stab incisions were made and two 5.0 mm locking screws were inserted 1 and an AP direction and one distally at approximately 30 degree oblique position. Position of the screws was confirmed using the C-arm.  C-arm was then positioned so as to better visualize the fracture site. Appropriate length and reduction was noted.  Outrigger device was attached to the proximal portion of the nail and two 5.0 mm locking screws were inserted; 1 in a static locking position and 1 in a dynamic locking position and a medial to lateral position.  Good position was noted using the C-arm. Outrigger device was removed.  The wounds were irrigated with copious amounts of normal saline with antibiotic solution.  Again, good position was noted in multiple planes using the C-arm. The wound was closed in layers using first a #1 Vicryl followed by 3-0 Monocryl.  Skin edges were reapproximated using skin staples.  A sterile dressing was applied.   The patient tolerated the procedure well.  She  was transported to the recovery room in stable condition.      ____________________________ Illene Labrador. Angie Fava., MD jph:DT D: 11/02/2013 06:41:00 ET T: 11/02/2013 12:56:15 ET JOB#: [id number]  cc: Illene Labrador. Angie Fava., MD, <Dictator> JAMES P Angie Fava MD ELECTRONICALLY SIGNED 11/02/2013 23:02

## 2014-05-13 NOTE — Discharge Summary (Signed)
PATIENT NAME:  Savannah Key, Savannah Key MR#:  [id number] DATE OF BIRTH:  Jun 07, 1940  DATE OF ADMISSION:  05/14/2011 DATE OF DISCHARGE:  05/19/2011   ADDENDUM: I would recommend to repeat a urinalysis after completion of antibiotic.  ____________________________ Fredia SorrowAbhinav Shabrea Weldin, MD ag:slb D: 05/19/2011 14:24:00 ET T: 05/20/2011 10:37:20 ET JOB#: [id number]  cc: Fredia SorrowAbhinav Carlyn Mullenbach, MD, <Dictator> Fredia SorrowABHINAV Rudolph Daoust MD ELECTRONICALLY SIGNED 05/27/2011 12:04

## 2014-05-13 NOTE — Consult Note (Signed)
Impression: 74yo WF w/ h/o DM, ESRD on HD, cirrhosis, hypothyroidism admitted with Citrobacter UTI and possible sepsis.  She is mentating well despite her low BP.  She has not required pressors.  Her blood cultures are pending.  She had a urine culture from earlier this week which grew Citrobacter.  It is possible that this organism has gotten into the blood.  Alternately, she could have become bacteremic related to her HD.  Her shunt catheter appears to be functioning well currently.  Agree with vanco and ceftriaxone.  Her most recent urine culture from admission is growing a GNR.  Will see if this again grows Citrobacter.   Await BCx results. If her BP drops significantly, would start pressors.   Electronic Signatures: Callyn Severtson, Rosalyn GessMichael E (MD) (Signed on 26-Apr-13 15:06)  Authored   Last Updated: 26-Apr-13 15:46 by Bravery Ketcham, Rosalyn GessMichael E (MD)

## 2014-05-13 NOTE — Discharge Summary (Signed)
PATIENT NAME:  Savannah Key, [name] MR#:  [id number] DATE OF BIRTH:  1940-03-16  DATE OF ADMISSION:  05/14/2011 DATE OF DISCHARGE:  05/19/2011  DISCHARGE DIAGNOSES:  1. Sepsis secondary to Citrobacter urinary tract infection.  2. Endstage renal disease on hemodialysis Tuesday, Thursday, Saturday.  3. Diabetes.  4. Hypertension.  5. Cirrhosis.  6. Hypothyroidism.  7. Anemia of chronic kidney disease.  8. History of coronary artery disease.  9. Thrombocytopenia secondary to cirrhosis.  CONSULTS:  1. Infectious disease, Dr. Leavy CellaBlocker.  2. Nephrology, Dr. Thedore MinsSingh and Dr. Cherylann RatelLateef.   HOSPITAL COURSE: This is a 74 year old female who has history of endstage renal disease on hemodialysis Tuesday, Thursday, Saturday, diabetes, hypertension, hypothyroidism, cirrhosis, and anemia of chronic kidney disease. She was admitted with fever and hypotensionwith suspected sepsis.  She started having a fever while at dialysis.  She had blood and urine cultures drawn at dialysis. Her urine culture at dialysis grew greater than 100,000 colonies of Citrobacter. She also had a blood culture drawn at dialysis, which grew gram-positive cocci. When she came to the Emergency Room, her temperature was 100.3 and her blood pressure was 99/47. She was admitted as sepsis either secondary to urinary tract infection versus secondary to dialysis catheter. The patient was admitted to the Intensive Care Unit because of her low blood pressure, but she never required any pressor support in the Intensive Care Unit. Her blood pressure was on the lower side. Her propranolol and Imdur were held.  She was started on vancomycin and ceftriaxone during the hospital stay. At admission she was found to have a white count of 5.9, hemoglobin of 10, and platelet count of 95,000. She has pancytopenia secondary to cirrhosis. Her urinalysis when she came in showed 3+ leukocyte esterase and 425 WBCs per high-power field. She improved on ceftriaxone and vancomycin.  Blood culture from April 25 was growing initially gram-positive cocci which was identified as Abiotrophia defectiva, which ID thinks is most likely a contaminant. Urine cultures again growing 100,000 colonies of Citrobacter freundii which is sensitive to ceftriaxone and sensitive to Levaquin. Her vancomycin has been discontinued  right now and ceftriaxone has been changed to Levaquin. She got about four days of ceftriaxone in the hospital and I am going to discharge her on Levaquin 250 mg every other day, four doses. Her white count has remained stable during the hospital stay. WBC 6.6, hemoglobin 9.8, and platelet count 93,000. She had repeat blood cultures done and those were negative on 04/27. She got dialysis during the hospital stay. Her last dialysis was today as inpatient before she is discharged.  Her next dialysis is due on Thursday. Her chest x-ray when she came in showed mild pulmonary vascular congestion and cardiomegaly, but no acute abnormality.  Her blood pressure is stable right now in the range of 119/67. I am going to restart her Imdur and I am going to hold her propranolol at this time. Her blood sugar was on the lower side when she came in, but her blood sugar stabilized. She is on 70/30, 10 units in the morning, and I am going to continue this regimen.  DISCHARGE MEDICATIONS:  1. Lyrica 50 mg daily.  2. Atorvastatin 20 mg daily.  3. Imdur 30 mg daily. 4. Levothyroxine 50 mcg daily. 5. Citalopram 10 mg daily.  6. Tylenol p.r.n.  7. Protonix 40 mg b.i.d.  8. Humibid 600 mg b.i.d.  9. Carafate 1 gram 4 times a day. 10. Aspirin 81 mg daily. New medication:  11. Levaquin 250 mg p.o. every other day for four doses. 12. Change insulin 70/30 to 10 units subcutaneous once daily in the morning.   NOTE: Do not take a propranolol.  Propranolol can be restarted if her blood pressure remains stable as an outpatient.  DIET:  Advised a low sodium, ADA, low cholesterol diet.   CONDITION AT  DISCHARGE: Comfortable. She is walking around. Her last vitals: T-max 98.6, heart rate 84, blood pressure 119/67, saturating 96% on room air. Chest is clear. Heart sounds are regular. Abdomen soft, nontender. She has a left AV graft with good thrill.     FOLLOWUP: The patient should follow up with PMD at [facility] program in the next few days and hemodialysis Tuesday, Thursday, Saturday in [place].   TIME SPENT ON DISCHARGE:  50 minutes.   ____________________________ Fredia Sorrow, MD ag:bjt D: 05/19/2011 14:23:04 ET T: 05/19/2011 16:54:25 ET JOB#: [id number]  cc: Fredia Sorrow, MD, <Dictator> [facility] Fredia Sorrow MD ELECTRONICALLY SIGNED 05/27/2011 12:04

## 2014-05-13 NOTE — Op Note (Signed)
PATIENT NAME:  Savannah Key, Savannah Key MR#:  [id number] DATE OF BIRTH:  Jan 01, 1941  DATE OF PROCEDURE:  03/18/2011  PREOPERATIVE DIAGNOSES:  1. End-stage renal disease.  2. Poorly functioning left arm AV graft.  3. Diabetes.  4. Heart disease.  5. Hypertension.   POSTOPERATIVE DIAGNOSES: 1. End-stage renal disease.  2. Poorly functioning left arm AV graft.  3. Diabetes.  4. Heart disease.  5. Hypertension.   PROCEDURES:  1. Ultrasound guidance for vascular access, left upper arm AV graft.  2. Left upper extremity shuntogram and central venogram.   SURGEON: Annice NeedyJason S. Dew, MD   ANESTHESIA: Local with moderate conscious sedation.   ESTIMATED BLOOD LOSS: Minimal.   CONTRAST: Less than 20 mL.   INDICATION FOR PROCEDURE: The patient is a 74 year old white female with end-stage renal disease. Apparently there have been difficulties with access in flow in her AV graft recently. Her most recent ultrasound several months ago was normal. She is brought in today for a shuntogram for further evaluation and possible treatment. Risks and benefits were discussed. Informed consent was obtained.   DESCRIPTION OF PROCEDURE: The patient was brought to the Vascular Interventional Radiology Suite. Left upper extremity was sterilely prepped and draped and a sterile surgical field was created. The shunt was accessed near the arterial access site under direct ultrasound guidance with a micropuncture needle and permanent image was recorded. A micropuncture wire and sheath were placed. I upsized to a 6 [place] sheath. Imaging performed revealed a patent AV graft. There were some mild irregularities in the midsegment that were not flow-limiting. The venous anastomosis was patent. The central venous circulation beyond the anastomosis was all patent with compression of the graft. I refluxed to evaluate the arterial anastomosis which was also patent. At this point a 4-0 Monocryl purse-string suture was placed and the sheath  was removed. Pressure was held. Sterile dressing was placed. The patient tolerated the procedure well and was taken to the recovery room in stable condition.   ____________________________ Annice NeedyJason S. Dew, MD jsd:drc D: 03/18/2011 14:03:21 ET T: 03/18/2011 14:41:08 ET JOB#: [id number]  cc: Annice NeedyJason S. Dew, MD, <Dictator> Annice NeedyJASON S DEW MD ELECTRONICALLY SIGNED 03/19/2011 12:08

## 2014-05-13 NOTE — H&P (Signed)
PATIENT NAME:  Savannah ShawlSUMNER, Savannah Key DATE OF BIRTH:  [birth year]  DATE OF ADMISSION:  05/14/2011  PRIMARY CARE PHYSICIAN: [facility]  REFERRING PHYSICIAN:  Joseph ArtAlice Finnell, MD    CHIEF COMPLAINT: Fever and hypotension.   HISTORY OF PRESENT ILLNESS: The patient is a 74 year old female with a known history of end-stage renal disease, on hemodialysis, diabetes, hypertension, is being admitted for suspected sepsis. The patient started having fever while at dialysis. She had a blood and urine culture drawn. Her fever was 3 or 4 days ago. Her urine culture grew gram-negative rods, greater than 100,000 colonies today; and she was given one dose of IV antibiotics while at dialysis today. She also had blood cultures drawn on April 23rd which were growing gram-positive cocci in pairs. That report also came back today on the 25th of April which was growing only in aerobic bottles, and she was given IV vancomycin and was requested to come to the Emergency Department. She was also given Tylenol as she was running fever, and while in the ER she was found to have a temperature of 100.3. Her blood pressure was 99/47. She is being admitted for further evaluation and management. The patient was quite sleepy in the ER although she was waking up slowly.   PAST MEDICAL HISTORY:  1. Endstage renal disease, on hemodialysis. 2. Hypertension. 3. Diabetes. 4. History of cirrhosis.  5. Hypothyroidism.  6. Gastroesophageal reflux disease.  7. Acute on chronic anemia.  8. Iron deficiency anemia.  9. Anemia of chronic kidney disease.  10. Hyperlipidemia.   ALLERGIES: Sulfa.   SOCIAL HISTORY: No smoking, no alcohol abuse, no illicit drug use. She lives at home by herself.   FAMILY HISTORY: Positive for diabetes in her father.  CURRENT MEDICATIONS:   1. Aspirin 81 mg p.o. daily. 2. Lipitor 20 mg p.o. daily.  3. Carafate 1 gram p.o. q.i.d.  4. Citalopram 10 mg p.o. daily.  5. Humibid LA 600 mg p.o.  b.i.d.  6. Isosorbide mononitrate 30 mg p.o. daily.  7. Levothyroxine 50 mcg p.o. daily.  8. Lyrica 50 mg p.o. daily.  9. Novolin 70/30 subcutaneous 20 units daily.  10. Propranolol 10 mg p.o. b.i.d.  11. Protonix 40 mg p.o. b.i.d.  12. Tylenol 500 mg p.o. daily as needed.   REVIEW OF SYSTEMS: CONSTITUTIONAL: Positive for fever, fatigue, weakness. EYES: No blurred or double vision. ENT: No tinnitus, ear pain. RESPIRATORY: No cough, wheezing, hemoptysis. CARDIOVASCULAR: No chest pain, orthopnea, edema. GASTROINTESTINAL: No nausea or diarrhea. GU: Positive for urine burning and end-stage renal disease, on hemodialysis. ENDOCRINE: No polyuria or nocturia. Positive for hypothyroidism. HEMATOLOGY: Anemia of chronic kidney disease. No bleeding. History of GI bleed. SKIN: No rash or lesion. MUSCULOSKELETAL: No arthritis or muscle pain. NEUROLOGICAL:  No tingling, weakness.  PSYCHIATRIC: No history of anxiety. Positive for depression.   PHYSICAL EXAMINATION:  VITAL SIGNS: Temperature 98.2, heart rate 60 per minute, respirations 18 per minute, blood pressure in the ER was 99/47, and now repeated is 109/40. She is saturating 97% on room air.   GENERAL: The patient is a 74 year old female lying in the bed comfortably without any acute distress.   HEENT: Eyes: Pupils are equal, round, reactive to light and accommodation. No scleral icterus. Extraocular muscles are intact. HENT: Head atraumatic, normocephalic. Oropharynx and nasopharynx are clear.   NECK: Supple. No jugular venous distention. No thyroid enlargement or tenderness.   LUNGS: Clear to auscultation bilaterally. No rales, rhonchi or crepitation.  CARDIOVASCULAR: S1, S2 normal. No murmurs, rubs or gallops.   ABDOMEN: Soft, nontender, nondistended. Bowel sounds are present. No organomegaly or masses.  EXTREMITIES: No pedal edema, cyanosis, or clubbing.   NEUROLOGICAL: Nonfocal examination. Cranial nerves III through XII are intact. Muscle  strength five out of five in extremities. Sensation intact.   PSYCHIATRIC: The patient is oriented to time, place, and person x3.   SKIN: No obvious rash, lesion, or ulcer.   LABORATORY, DIAGNOSTIC AND RADIOLOGICAL DATA:  Normal BMP except a creatinine of 2.20.  Normal liver function tests except AST of 48.  CBC showed white count of 5.9, hemoglobin 10.9, hematocrit 30.9, platelet 95.  Urinalysis showed 3+ bacteria, 425 WBCs, 3+ leukocyte esterase, WBC in clumps present.  Chest x-ray while in the Emergency Department showed mild pulmonary vascular congestion and cardiomegaly. No acute abnormality.  EKG showed no major ST-T changes.  Report from [facility] from outpatient [place] showed positive blood culture in aerobic bottle growing gram-positive cocci in pairs which was drawn on 05/12/2011 and reported on 05/14/2011.  Her urine culture is growing gram-negative rods, 100,000 colonies, which was reported today and also drawn on 05/12/2011.   IMPRESSION AND PLAN:  1. Suspected sepsis: Likely due to urinary source, cannot rule out underlying dialysis catheter being a source as she was growing gram-positive cocci also. We will obtain blood and urine culture while here to repeat and confirm. Obtain ID consult. We will start her on IV vancomycin and Rocephin and have Pharmacy dose it. We will hold off ordering dialysis catheter removal until Infectious Disease evaluates the patient to see that this is needed.  2. Urinary tract infection based on urinalysis: We will obtain urine culture and sensitivity. We will start IV Rocephin.  3. End-stage renal disease, on hemodialysis:  We will consult Nephrology for further needed.  4. Diabetes mellitus: We will start on sliding scale insulin, continue home insulin dosage.  5. Hypothyroidism: We will check TSH. Continue Synthroid at her home dose.  6. Neuropathy: We will continue Lyrica.  7. Hypotension with a history of hypertension: Likely due to  sepsis. We will hold off her blood pressure medication, including propranolol.  8. Gastroesophageal reflux disease: We will continue Protonix. She does have a history of GI bleed, also. We will continue Carafate and Protonix both.   CODE STATUS:  FULL CODE.     TIME TAKEN: Total time taking care of this patient (critical care) is 55 minutes.  ____________________________ Ellamae Sia. Sherryll Burger, MD vss:cbb D: 05/14/2011 23:26:52 ET T: 05/15/2011 08:25:37 ET JOB#: [id number]  cc: Phi Avans S. Sherryll Burger, MD, <Dictator> [facility] Ellamae Sia [facility] MD ELECTRONICALLY SIGNED 05/15/2011 21:39

## 2014-05-13 NOTE — Consult Note (Signed)
PATIENT NAME:  Savannah Key, CHAMBLEE MR#:  [id number] DATE OF BIRTH:  06-Feb-1940  DATE OF CONSULTATION:  05/15/2011  REFERRING PHYSICIAN:  Dr. Cherylann Ratel  CONSULTING PHYSICIAN:  Rosalyn Gess. Elad Macphail, MD  REASON FOR CONSULTATION: Possible sepsis.   HISTORY OF PRESENT ILLNESS: Patient is a 74 year old white female with a past history significant for diabetes, hypothyroidism, end-stage renal disease on hemodialysis who was admitted on 04/25 with fever and hypotension. Patient states that she had been feeling poorly for the last half week to a week. She describes having some subjective fevers, some malaise and was found to have a low-grade temperature at dialysis and be hypotensive. Earlier last week she had urine and blood cultures obtained. The urine cultures have grown greater than 100,000 CFU/mL of Citrobacter. She had not been started on antibiotics. Apparently blood cultures are growing gram-positive cocci in pairs but it is unclear what species was identified. The results were not directly available for evaluation. With her hypotension and low-grade fever she was brought to the Emergency Room. She was admitted to the Critical Care Unit because of the hypotension. She has not required pressors, however, and she is mentating fairly well. She has been tolerating dialysis without significant problem and has had no problem with her shunt in her left upper extremity. She does make urine and has not been having significant dysuria. She denies any nausea or vomiting or change in her bowels. She has had some sinus congestion and headache and was recently treated with a Z-Pak a few weeks ago for an upper respiratory infection. She has not had any rashes. She has not noted any significant muscle or joint discomfort.   ALLERGIES: Sulfa.   PAST MEDICAL HISTORY:  1. Diabetes.  2. Hypothyroidism.  3. End-stage renal disease on hemodialysis.  4. Hypertension.  5. Cirrhosis.  6. Gastroesophageal reflux.   7. Hypercholesterolemia.  SOCIAL HISTORY: She lives by herself. She does not smoke. She does not drink. No injecting drug use history.  FAMILY HISTORY: Positive for diabetes in her father.   CURRENT ANTIBIOTICS: Vancomycin and ceftriaxone. She was given a dose of levofloxacin in the Emergency Room.    REVIEW OF SYSTEMS: GENERAL: Positive subjective fever and malaise. No chills or sweats. HEENT: Some headache, some nasal congestion, some sinus congestion in the past but this has improved. No sore throat. NECK: No stiffness. No swollen glands. RESPIRATORY: Some cough. No shortness of breath. No sputum production. CARDIAC: No chest pains or palpitations. GASTROINTESTINAL: No nausea, no vomiting, no abdominal pain, no change in her bowels. GENITOURINARY: No change in her urine, specifically no dysuria. No hematuria. No increased frequency. No flank pain. MUSCULOSKELETAL: No frank joint swelling or redness. SKIN: No rashes. NEUROLOGIC: No focal weakness. PSYCHIATRIC: No complaints. All other systems are negative.   PHYSICAL EXAMINATION:  VITAL SIGNS: T-max 98.2, pulse 60, blood pressure 96/45, 96% on room air.   GENERAL: 74 year old white female in no acute distress breathing comfortably.   HEENT: Normocephalic, atraumatic. Pupils equal and reactive to light. Extraocular motion intact. Sclerae, conjunctivae, and lids are without evidence for emboli or petechiae. Oropharynx shows no erythema or exudate. Teeth and gums are in fair condition.   NECK: Supple. Full range of motion. Midline trachea. No lymphadenopathy. No thyromegaly.   CHEST: Clear to auscultation bilaterally with good air movement. No focal consolidation.   CARDIAC: Regular rate and rhythm without murmur, rub, or gallop.   ABDOMEN: Soft, nontender, nondistended. No hepatosplenomegaly. No hernia is noted.   EXTREMITIES: No  evidence for tenosynovitis. She has an AV fistula in the left upper extremity and was currently being accessed  for dialysis. No stigmata of endocarditis, specifically no Janeway lesions nor Osler nodes.   NEUROLOGIC: The patient was awake and interactive, moving all four extremities. She was mentating well.   LABORATORY, DIAGNOSTIC, AND RADIOLOGICAL DATA: BUN 28, creatinine 2.71, bicarbonate 28, anion gap 11, AST 41, ALT 26, alkaline phosphatase 119, total bilirubin 0.4, white count 5.4, hemoglobin 9.3, platelet count 87, ANC 3.1. Blood cultures from admission show no growth. A urinalysis had 100 mg/dL of protein, 1+ blood, negative nitrites, 3+ leukocyte esterase, 14 red cells and 425 white cells per high-powered field. A urine culture is growing greater than 100,000 CFU/mL of a gram-negative rod. Chest x-ray from admission showed mild pulmonary vascular congestion. Chest x-ray from today showed mild cardiomegaly but no infiltrates.   IMPRESSION: 74 year old white female with a past history significant for diabetes, end-stage renal disease on hemodialysis, cirrhosis and hypothyroidism who was admitted with Citrobacter urinary tract infection and possible sepsis.   RECOMMENDATIONS:  1. She is mentating well despite her low blood pressure. She has not required pressors. Her blood cultures are currently pending. She had a urine culture from earlier this week which grew Citrobacter. It is possible this organism has gotten into the blood. Alternatively, she could have been become bacteremic related to her hemodialysis. Her shunt appears to be functioning well, however.  2. Agree with vancomycin and ceftriaxone. Her most recent urine culture from admission is growing a gram-negative rod. Will see if this again grow Citrobacter.  3. Will await the blood culture results.  4. If her blood pressure drops significantly would start pressors.   This is a moderately complex infectious disease case.   Thank you very much for involving me in Ms. Benedetti's care.   ____________________________ Rosalyn GessMichael E. Pollyann Roa,  MD meb:cms D: 05/15/2011 15:46:59 ET T: 05/15/2011 16:25:11 ET JOB#: [id number]  cc: Rosalyn GessMichael E. Emaan Gary, MD, <Dictator> Farah Benish E Langdon Crosson MD ELECTRONICALLY SIGNED 05/19/2011 15:02

## 2014-05-20 NOTE — Consult Note (Signed)
PATIENT NAME:  Savannah Key, Savannah Key MR#:  [id number] DATE OF BIRTH:  [birth year]  DATE OF CONSULTATION:  03/04/2014  CONSULTING PHYSICIAN:  Kathreen DevoidKevin L. Kerah Hardebeck, MD  REASON FOR CONSULTATION: Question of infected fasciotomy wounds.   HISTORY OF PRESENT ILLNESS: Ms Savannah Key is a [age] year old female who sustained a tibial fracture in October. She had a 4 compartment fasciotomy performed by Dr. Astrid DraftsSloboda on October 10. The patient had a full closure of her medial fasciotomy and a 60% closure of her lateral fasciotomy at that time. She had a delayed intramedullary fixation of her tibial fracture by Dr. Ernest PineHooten on October 14. The patient has continued to follow up with Dr. Ernest PineHooten. She also explained that she has been followed in the [facility] for management of her fasciotomy wounds. It sounds as if the patient has had prolonged healing of these fasciotomy wounds. She is currently performing dry sterile dressings at home. She is due to see Dr. Ernest PineHooten in 2 weeks. The patient was admitted for nausea and vomiting x 24 hours, but now feels better. She has end-stage renal disease and is on hemodialysis. She is due for hemodialysis tomorrow. She denies any right lower leg pain. She has not noted any significant swelling, redness or drainage.   PAST MEDICAL HISTORY: End-stage renal disease on hemodialysis Monday, Wednesday and Friday, hypertension, type 2 diabetes, peripheral neuropathy, hypothyroidism, GERD, diastolic congestive heart failure and an ejection fraction of 60%.   SOCIAL HISTORY: The patient denies smoking, alcohol use or drug use.   ALLERGIES: SHE HAS ALLERGY TO SULFA MEDICATION.   HOME MEDICATIONS: Including trazodone 50 mg daily, Ropinirole 2 mg at bedtime, Protonix 40 mg b.i.d., Norco 5/325 mg 1 to 2 tabs every 4 to 6 hours p.r.n. for pain, Nitrostat 0.4 mg sublingual every 5 minutes as needed for chest pain, Lyrica 15 mg 1 tablet daily, Lipitor 80 mg daily, levothyroxine 50  mcg 1 tablet daily, isosorbide mononitrate 1 tablet daily, insulin 30 units subcutaneously twice a day before meals, insulin aspart per sliding scale, hydroxyzine 10 mg 1 tablet b.i.d. and                 20 mg 1 tablet b.i.d.   PHYSICAL EXAMINATION: GENERAL: The patient is lying comfortably in bed in her hospital room. She denies any pain in the right leg. She has healed proximal incisions at the insertion site of the tibial nail. Her leg is in good alignment. She has 2 fasciotomy scars both medial and lateral. The distal-most aspect of both of these incisions appear healed over with eschar. There is no drainage. The patient's leg compartments are soft and compressible. She has no tenderness over the tibia. She can flex and extend her toes. She can feel me touching her foot, but has baseline peripheral neuropathy. She has palpable pedal pulses. She can flex and extend her toes and dorsiflex and plantarflex her ankle. The patient has faint reactive erythema around the area of eschar consistent with healing. There is no area of fluctuance. No fluid is actively draining from her fasciotomy scars. No fluid can be expressed with palpation at the distal ends of these fasciotomy scars.   ASSESSMENT: Healing right leg fasciotomy wounds.   PLAN: Ms Savannah Key explains that she is followed at the wound care center. She is performing dry sterile dressings at home with the help of a family member. She is due to see Dr. Ernest PineHooten in 2 weeks. She has no pain in the  right leg. There is no evidence of active infection in her right leg. The patient does not have signs of abscess or cellulitis at this time. A Silvadene dressing was applied by the nursing staff today. I agreed with continuing her dressing regimen. She may follow up with the wound care center in regards to these fasciotomy scars and she will follow up with Dr. Ernest Pine as scheduled in 2 weeks. There is no indication for surgical debridement of these wounds. No surgical  intervention is necessary at this time. She will continue taking her oral Cipro and may be discharged from an orthopedic standpoint tomorrow after she receives dialysis.     ____________________________ Kathreen Devoid, MD klk:TT D: 03/04/2014 17:53:02 ET T: 03/04/2014 18:20:13 ET JOB#: [id number]  cc: Kathreen Devoid, MD, <Dictator> Kathreen Devoid MD ELECTRONICALLY SIGNED 03/14/2014 14:45

## 2014-05-20 NOTE — Discharge Summary (Signed)
PATIENT NAME:  Savannah Key, Savannah Key MR#:  [id number] DATE OF BIRTH:  [birth year]  DATE OF ADMISSION:  03/02/2014 DATE OF DISCHARGE:  03/05/2014  ADMITTING COMPLAINT: Nausea, vomiting, and diarrhea.   DISCHARGE DIAGNOSES: 1. Viral gastroenteritis.  2. Possible infection of right lower extremity wound after fasciotomy. 3. Systemic inflammatory response syndrome with hypotension, now resolved.  4. End-stage renal disease on hemodialysis.  5. Diabetes mellitus type 2 with peripheral neuropathy.  6. Hypothyroidism.  7. Gastroesophageal reflux disease.  8. Chronic diastolic congestive heart failure without exacerbation, ejection fraction 60%.  9. Hypertension.   CONSULTATIONS: 1. Dr. Mosetta Pigeon, nephrology.  2. Dr. Juanell Fairly, orthopedics.  2. Dr. Mady Haagensen nephrology.   PROCEDURES: Chest x-ray February 13 showed no active cardiopulmonary disease.   HISTORY OF PRESENT ILLNESS: This 74 year old female with a history of end-stage renal disease, hypertension, and diabetes presents to the Emergency Room with nausea, vomiting, and diarrhea x 1 day. At the time of presentation, she is alert, oriented, in no distress. She states that she was fine until about 3:00 a.m. on the morning of admission. She denied any melena or hematochezia. Her granddaughter also had nausea, vomiting, and diarrhea. She is noted to be hypotensive on presentation with blood pressure of 60/55. Treated with 50 mL of normal saline. Blood pressures continued to be low at 74/57. She was admitted for further evaluation and treatment of hypotension.   HOSPITAL COURSE BY PROBLEM:  1. Acute gastroenteritis: Blood cultures were negative. Clostridium difficile  laboratories were negative. Complete stool culture negative. Blood pressure improved with some IV fluids. She is to complete a 10-day course of ciprofloxacin for potential gastrointestinal infection, but this may be also a viral gastroenteritis. At the time of discharge, she  is tolerating a normal diet.  2. Infected  right lower extremity wound with stage 2 ulcer: Wound care consultation was obtained during hospitalization and wound dressings were changed accordingly. She was seen by orthopedics, who did not suspect that the wound was infected. She has a follow-up appointment with Dr. Ernest Pine in 2 weeks. She has had 4-compartment fasciotomy by Dr. Astrid Drafts on October 10. She had full closure of the medial fasciotomy and 60% closure of the lateral fasciotomy at that time. She has been following at the wound center. She will continue on ciprofloxacin, which would cover some sources of wound infection. She will continue with Silvadene cream. She is to perform dry sterile dressings at home. No sign of abscess or cellulitis. Follow-up with Dr. Ernest Pine in 2 weeks.  3. Systemic inflammatory response syndrome with hypotension. This was likely due to volume loss after gastroenteritis. Resolved at the time of discharge.  4. End-stage renal disease: The patient was followed by nephrology and continued with dialysis throughout the hospitalization.   DISCHARGE PHYSICAL EXAMINATION: VITAL SIGNS: Temperature 97.8, pulse 93, respirations 20, blood pressure 120/65, oxygenation 95% on room air  GENERAL: No acute distress.  RESPIRATORY: Normal overall respiratory effort. Clear breath sounds. No use of accessory muscles. CARDIOVASCULAR: Regular rate and rhythm. No murmurs, rubs, or gallops. No peripheral edema.  SKIN: Normal to palpation. She does have right lower extremity wounds, which are dressed. Dressing is dry and clean.  NEUROLOGIC: Cranial nerves II through XII grossly intact. Motor and sensory function intact.  PSYCHIATRIC: The patient is alert and oriented with good insight into her clinical condition.   DISCHARGE LABORATORY DATA: Sodium 137, potassium 4.0, chloride 104, bicarbonate 24, BUN 70 creatinine 3.88, glucose 287, albumin 2.3. Troponin 0.03. White  blood cells 4.4, hemoglobin  9.2, platelets 104. Blood cultures: No growth. Stool cultures: No growth. Clostridium difficile negative. Urinalysis negative for signs of infection.   CONDITION ON DISCHARGE: Stable.   DISPOSITION: The patient is discharged to home with no further home health needs.   DISCHARGE MEDICATIONS: 1. Levothyroxine 50 mcg 1 tablet once a day.  2. Trazodone 50 mcg 1 tablet once a day for insomnia at bedtime.  3. Nitrostat 0.4 mg sublingual tablet 1 tablet every 3 minutes as needed for chest pain 4. Hydroxyzine hydrochloride 10 mg 1 tablet twice a day for itching.  5. Famotidine 20 mg 1 tablet twice a day.  6. Effexor XR 37.5 mg 1 tablet once a day at bedtime.  7. Drisdol 50,000 international units 1 capsule once a month on the 1st Monday of the month.  8. Lyrica 50 mg 1 capsule once a day at bedtime for leg pain.  9. Protonix 40 mg 1 tablet twice a day for reflux.  10. Cetirizine 5 mg 1 tablet once a day for allergies.  11. Lipitor 80 mg 1 tablet once a day at bedtime.  12. Colace 100 mg 1 tablet once a day.  13. Ropinirole 2 mg 1 tablet once a day at bedtime.  14. Acetaminophen 325 mg 2 tablets every 4-6 hours as needed for fever or pain. 15. Cyclobenzaprine 5 mg 1 tablet every 6 hours as needed for muscle spasm.  16. Insulin aspart 100 units/mL per sliding scale.  17. Sulfadiazine 1% topical cream apply to right leg wound once a day.  18. Ciprofloxacin 500 mg 1 tablet every 24 hours.   DISCHARGE INSTRUCTIONS:  DIET: Carbohydrate modified renal diet.   ACTIVITY: As tolerated.   TIMEFRAME FOR FOLLOWUP:  1. Follow up in 1-2 weeks with Dr. Ernest PineHooten.  2. Follow up in 1-2 days with the [facility].   TIME SPENT ON DISCHARGE: 40 minutes.    ____________________________ Ena Dawleyatherine P. Clent RidgesWalsh, MD cpw:mw D: 03/08/2014 17:57:28 ET T: 03/08/2014 18:18:51 ET JOB#: [id number]  cc: Santina Evansatherine P. Clent RidgesWalsh, MD, <Dictator> [facility] Purcell [facility]outoneilly at [facility] CATHERINE Demetrius CharityP [facility], IncWALSH MD ELECTRONICALLY SIGNED  03/08/2014 23:30

## 2014-05-20 NOTE — Consult Note (Signed)
Brief Consult Note: Diagnosis: Right leg wounds.   Patient was seen by consultant.   Consult note dictated.   Comments: No evidence of infection of fasciotomy wounds, right leg.  No indication for surgical debridement at this time.  Patient will follow up with Dr. Ernest PineHooten as scheduled in 2 weeks.  Electronic Signatures: Juanell FairlyKrasinski, Grasiela Jonsson (MD)  (Signed 14-Feb-16 17:42)  Authored: Brief Consult Note   Last Updated: 14-Feb-16 17:42 by Juanell FairlyKrasinski, Esaw Knippel (MD)

## 2014-05-20 NOTE — H&P (Signed)
PATIENT NAME:  Savannah Key, Savannah Key MR#:  [id number] DATE OF BIRTH:  [birth year]  DATE OF ADMISSION:  03/02/2014  PRIMARY CARE PHYSICIAN:  Lorre Nick, MD    CHIEF COMPLAINT: Nausea, vomiting and diarrhea today.   HISTORY OF PRESENT ILLNESS: A 74 year old female with a history of ESRD, hypertension, diabetes, presented to the ED with above chief complaint. The patient is alert, awake, oriented, in no acute distress. The patient said that she was fine until this morning, about 3:00 a.m. The patient started to have nausea, vomiting and diarrhea multiple times today. Diarrhea is watery without any bloody stool or melena. The patient denies any fever or chills. No headache or dizziness but has generalized weakness. The patient denies any abdominal pain. No chest pain, coughing or shortness of breath. The patient said her granddaughter had nausea, vomiting and diarrhea since yesterday. She denies any travel history or eating left over food. The patient was noticed to have hypotension at 60/55 in the ED, was treated with 500 mL  normal saline, but blood pressure is still low at 74/57.   PAST MEDICAL HISTORY: ESRD on hemodialysis on Monday, Wednesday, Friday; hypertension, diabetes 2, neuropathy, hypothyroidism; GERD; diastolic CHF, ejection fraction of 60%.   SOCIAL HISTORY: No smoking or drinking or illicit drugs.   FAMILY HISTORY: CAD.   PAST SURGICAL HISTORY: Cholecystectomy, kidney stent, CAD status post a stent.   ALLERGIES: SULFA.   HOME MEDICATIONS: Medication reconciliation is not done yet we will update.   REVIEW OF SYSTEMS:   CONSTITUTIONAL: The patient denies any fever or chills. No headache or dizziness, but has weakness.  EYES: No double vision, blurred vision.  EARS AND NOSE AND THROAT: No postnasal drip, slurred speech or dysphagia.  CARDIOVASCULAR: No chest pain, palpitation, no orthopnea, or nocturnal dyspnea, no leg edema.  PULMONARY: No cough, sputum, shortness of breath, or  hematemesis.  GASTROINTESTINAL: No abdominal pain, bilateral nausea, vomiting, diarrhea; no melena or bloody stool.  GENITOURINARY: No dysuria, hematuria, or incontinence.  SKIN: No rash or jaundice.  NEUROLOGIC: No syncope, loss of consciousness, or seizure.  ENDOCRINOLOGY: No polyuria, polydipsia, heat or cold intolerance.  HEMATOLOGY: No easy bleeding or bleeding.   VITAL SIGNS: Temperature 101, blood pressure 88/46, pulse 94, oxygen saturation 95% on room air.   PHYSICAL EXAMINATION: GENERAL: The patient is alert, awake, oriented, in no acute distress, obese.  HEENT: Pupils round, equal and reactive to light and accommodation, moist oral mucosa, clear oropharynx.  NECK: Supple. No JVD or carotid bruits are noted. No lymphadenopathy. No thyromegaly mucus.  CARDIOVASCULAR: S1 and S2, regular rate and rhythm, no murmurs, gallop.  PULMONARY: Bilateral air entry. No wheezing or rales. No use of accessory muscle to breathe.  ABDOMEN: Soft. No distention or tenderness. No organomegaly. Bowel sounds present.  EXTREMITIES: No edema, clubbing or cyanosis. No calf tenderness. Bilateral pedal pulses present; right lower extremity in dressing due to previous tibia fibula fracture.  SKIN: No rash or jaundice.  NEUROLOGIC: A and O x 3, no focal deficit, power 5/5, sensation intact.   LABORATORY DATA: Urinalysis is negative; troponin 0.03, WBC 7.7, hemoglobin 9.5, platelets 111,000, glucose 195, BUN 29, creatinine 2.45, electrolytes normal except calcium 6.6, albumin 2.1, SGPT 67, SGOT 88,  stool C diff test is negative.   IMPRESSIONS: 1.  Hypotension, possibly due to dehydration.  2.  Acute gastroenteritis.  3.  Dehydration.  4.  End-stage renal disease, on hemodialysis.  5.  Hypertension, now hypotension.  6.  Diabetes.  7.  Obesity.  8.  History of chronic diastolic congestive heart failure, ejection fraction of 60%.  9.  Hypothyroidism.  10.  Neuropathy.   PLAN OF TREATMENT: 1.  The  patient will be admitted to medical floor.  2.  We will continue give normal saline and closely monitor vital signs.  3.  For acute gastroenteritis and we will give Cipro, and follow up stool comprehensive culture. 4.  For ESRD, we will request nephrology consult to continue hemodialysis.  5.  Hypertension. We will hold hypertension medication.  6.  For diabetes, we will start a sliding scale, hold Lantus for now, and may add Lantus depending on patient's glucose level.   I discussed the patient's condition and plan of treatment with the patient, the patient wants full code.   Time spent about 56 minutes.    ____________________________ Shaune PollackQing Emilygrace Grothe, MD qc:nt D: 03/02/2014 22:04:02 ET T: 03/02/2014 22:25:29 ET JOB#: [id number]  cc: Shaune PollackQing Oslo Huntsman, MD, <Dictator> Shaune PollackQING Srinivas Lippman MD ELECTRONICALLY SIGNED 03/03/2014 15:18

## 2014-05-29 ENCOUNTER — Encounter: Payer: Medicare (Managed Care) | Attending: Surgery | Admitting: Surgery

## 2014-05-29 DIAGNOSIS — S81801D Unspecified open wound, right lower leg, subsequent encounter: Secondary | ICD-10-CM | POA: Insufficient documentation

## 2014-05-29 DIAGNOSIS — T8130XD Disruption of wound, unspecified, subsequent encounter: Secondary | ICD-10-CM | POA: Insufficient documentation

## 2014-05-29 DIAGNOSIS — Y839 Surgical procedure, unspecified as the cause of abnormal reaction of the patient, or of later complication, without mention of misadventure at the time of the procedure: Secondary | ICD-10-CM | POA: Insufficient documentation

## 2014-05-29 NOTE — Progress Notes (Signed)
Savannah Key, Elisandra M. ([id number]) Visit Report for 05/29/2014 Chief Complaint Document Details Patient Name: Savannah Key, Wynonia M. Date of Service: 05/29/2014 11:00 AM Medical Record Number: [id number] Patient Account Number: [id number] Date of Birth/Sex: 04/20/1940 ([name]) Treating RN: Primary Care Physician: Hinda LenisEILLY, SHARON Other Clinician: Referring Physician: Alberteen SpindleALLEN, COLETTE Treating Physician/Extender: Rudene ReBritto, Jessika Rothery Weeks in Treatment: 25 Information Obtained from: Patient Chief Complaint post surgical wound infection right lower extremity. The staples were removed and the wound opened out. she says she is doing fine and has no fresh issues. 03/20/14 -- was unable to come for the last several days due to transport problems. She has been doing fine otherwise and doing addressing as required. 04/17/2014 -- she continues to depend on her transportation and if this is erratic she doesn't come to see as an couple of weeks. She has had no fresh issues in the last 2 weeks we have not seen her. 04/03/2014 -- She continues to have problems with her transport and has only been able to come back to see us in 2 weeks' time. Electronic Signature(s) Signed: 05/29/2014 2:25:04 PM By: Evlyn KannerBritto, Naija Troost MD, FACS Entered By: Evlyn KannerBritto, Kennette Cuthrell on 05/29/2014 11:45:56 Savannah Key, Yoona M. ([id number]) -------------------------------------------------------------------------------- Debridement Details Patient Name: Savannah Key, Regla M. Date of Service: 05/29/2014 11:00 AM Medical Record Number: [id number] Patient Account Number: [id number] Date of Birth/Sex: 08/07/1940 ([name]) Treating RN: Primary Care Physician: Hinda LenisEILLY, SHARON Other Clinician: Referring Physician: Alberteen SpindleALLEN, COLETTE Treating Physician/Extender: Rudene ReBritto, Timo Hartwig Weeks in Treatment: 25 Debridement Performed for Wound #1 Right,Lateral Lower Leg Assessment: Performed By: Physician Tristan SchroederBritto, Yuvonne Lanahan J., MD Debridement: Open Wound/Selective Debridement  Selective Description: Pre-procedure Yes Verification/Time Out Taken: Start Time: 11:30 Pain Control: Lidocaine 5% topical ointment Level: Non-Viable Tissue Total Area Debrided (L x 1 (cm) x 0.5 (cm) = 0.5 (cm) W): Tissue and other Non-Viable, Exudate, Fibrin/Slough, Skin material debrided: Instrument: Forceps, Scissors Bleeding: Minimum Hemostasis Achieved: Pressure End Time: 11:33 Procedural Pain: 0 Post Procedural Pain: 0 Response to Treatment: Procedure was tolerated well Post Debridement Measurements of Total Wound Length: (cm) 1.3 Width: (cm) 0.5 Depth: (cm) 0.1 Volume: (cm) 0.051 Electronic Signature(s) Signed: 05/29/2014 2:25:04 PM By: Evlyn KannerBritto, Sherrill Buikema MD, FACS Entered By: Evlyn KannerBritto, Pria Klosinski on 05/29/2014 11:43:53 Savannah Key, Clairissa M. ([id number]) -------------------------------------------------------------------------------- HPI Details Patient Name: Savannah Key, Mindee M. Date of Service: 05/29/2014 11:00 AM Medical Record Number: [id number] Patient Account Number: [id number] Date of Birth/Sex: 07/01/1940 ([name]) Treating RN: Primary Care Physician: Hinda LenisEILLY, SHARON Other Clinician: Referring Physician: Alberteen SpindleALLEN, COLETTE Treating Physician/Extender: Rudene ReBritto, Izetta Sakamoto Weeks in Treatment: 25 History of Present Illness Location: right lower extremity Quality: minimal pain Duration: several weeks Associated Signs and Symptoms: no fever HPI Description: The patient returns for follow-up today. She has been very compliant with her dressings. Denies any fevers at home. says she is pleased with the way her wound is healing and she has no fresh problems. 05/15/2014 -- patient still continues to have transport problems and is unable to come to see as regularly. Because of this we cannot apply a compression wrap and the other situation is that she uses a ultrasound machine for his wound stimulation of a her bone as per the orthopedic doctors. Electronic Signature(s) Signed: 05/29/2014  2:25:04 PM By: Evlyn KannerBritto, Bindu Docter MD, FACS Entered By: Evlyn KannerBritto, Yaseen Gilberg on 05/29/2014 11:46:56 Savannah Key, Alaycia M. ([id number]) -------------------------------------------------------------------------------- Physical Exam Details Patient Name: Savannah Key, Jezel M. Date of Service: 05/29/2014 11:00 AM Medical Record Number: [id number] Patient Account Number: [id number] Date of Birth/Sex: 03/12/1940 ([name]) Treating RN: Primary Care Physician:  Hinda LenisEILLY, SHARON Other Clinician: Referring Physician: Alberteen SpindleALLEN, COLETTE Treating Physician/Extender: Rudene ReBritto, Zohra Clavel Weeks in Treatment: 25 Constitutional . Pulse regular. Respirations normal and unlabored. Afebrile. . Eyes Nonicteric. Reactive to light. Ears, Nose, Mouth, and Throat Lips, teeth, and gums WNL.Marland Kitchen. Moist mucosa without lesions . Neck supple and nontender. No palpable supraclavicular or cervical adenopathy. Normal sized without goiter. Respiratory WNL. No retractions.. Cardiovascular Pedal Pulses WNL. he still has minimal edema in the right lower extremity +1 pitting. Integumentary (Hair, Skin) she has some eschar and fibrin surrounding her wound but otherwise is looking very healthy.. No crepitus or fluctuance. No peri-wound warmth or erythema. No masses.Marland Kitchen. Psychiatric Judgement and insight Intact.. No evidence of depression, anxiety, or agitation.. Electronic Signature(s) Signed: 05/29/2014 2:25:04 PM By: Evlyn KannerBritto, Orlean Holtrop MD, FACS Entered By: Evlyn KannerBritto, Eliza Grissinger on 05/29/2014 11:49:22 Savannah Key, Siara M. ([id number]) -------------------------------------------------------------------------------- Physician Orders Details Patient Name: Savannah Key, Halleigh M. Date of Service: 05/29/2014 11:00 AM Medical Record Number: [id number] Patient Account Number: [id number] Date of Birth/Sex: 06/25/1940 ([name]) Treating RN: Renee HarderMabry, Kelsey Primary Care Physician: Hinda LenisEILLY, SHARON Other Clinician: Referring Physician: Alberteen SpindleALLEN, COLETTE Treating Physician/Extender: Rudene ReBritto,  [place] Weeks in Treatment: 7525 Verbal / Phone Orders: Yes Clinician: Renee HarderMabry, Kelsey Read Back and Verified: Yes Diagnosis Coding Wound Cleansing Wound #1 Right,Lateral Lower Leg o Clean wound with Normal Saline. - may use mild soap and warm water Anesthetic Wound #1 Right,Lateral Lower Leg o Topical Lidocaine 4% cream applied to wound bed prior to debridement Primary Wound Dressing Wound #1 Right,Lateral Lower Leg o Prisma Ag Secondary Dressing Wound #1 Right,Lateral Lower Leg o Gauze and Kerlix/Conform Dressing Change Frequency Wound #1 Right,Lateral Lower Leg o Change Dressing Monday, Wednesday, Friday Follow-up Appointments Wound #1 Right,Lateral Lower Leg o Return Appointment in 2 weeks. Edema Control Wound #1 Right,Lateral Lower Leg o Tubigrip - double layer Additional Orders / Instructions Wound #1 Right,Lateral Lower Leg o Increase protein intake. o Activity as tolerated [facility] [place] [facility], Dimitri M. ([id number]) Wound #1 Right,Lateral Lower Leg o [facility] Electronic Signature(s) Signed: 05/29/2014 2:25:04 PM By: Evlyn KannerBritto, Mahkayla Preece MD, FACS Signed: 05/29/2014 4:31:45 PM By: Renee HarderMabry, Kelsey RN Entered By: Renee HarderMabry, Kelsey on 05/29/2014 11:37:31 Donado, Doralee AlbinoLINDA M. ([id number]) -------------------------------------------------------------------------------- Problem List Details Patient Name: Savannah Key, Pasqualina M. Date of Service: 05/29/2014 11:00 AM Medical Record Number: [id number] Patient Account Number: [id number] Date of Birth/Sex: 08/24/1940 ([name]) Treating RN: Primary Care Physician: Hinda LenisEILLY, SHARON Other Clinician: Referring Physician: Alberteen SpindleALLEN, COLETTE Treating Physician/Extender: Rudene ReBritto, Braxten Memmer Weeks in Treatment: 25 Active Problems ICD-10 Encounter Code Description Active Date Diagnosis T81.30XA Disruption of wound, unspecified, initial encounter 12/05/2013 Yes Inactive Problems Resolved Problems Electronic  Signature(s) Signed: 05/29/2014 2:25:04 PM By: Evlyn KannerBritto, Samaiya Awadallah MD, FACS Entered By: Evlyn KannerBritto, Kianna Billet on 05/29/2014 11:39:22 [place]ide, Doralee AlbinoLINDA M. ([id number]) -------------------------------------------------------------------------------- Progress Note Details Patient Name: Savannah Key, Micala M. Date of Service: 05/29/2014 11:00 AM Medical Record Number: [id number] Patient Account Number: [id number] Date of Birth/Sex: 01/27/1940 ([name]) Treating RN: Primary Care Physician: Hinda LenisEILLY, SHARON Other Clinician: Referring Physician: Alberteen SpindleALLEN, COLETTE Treating Physician/Extender: Rudene ReBritto, Robet Crutchfield Weeks in Treatment: 25 Subjective Chief Complaint Information obtained from Patient post surgical wound infection right lower extremity. The staples were removed and the wound opened out. she says she is doing fine and has no fresh issues. 03/20/14 -- was unable to come for the last several days due to transport problems. She has been doing fine otherwise and doing addressing as required. 04/17/2014 -- she continues to depend on her transportation and if this is erratic  she doesn't come to see as an couple of weeks. She has had no fresh issues in the last 2 weeks we have not seen her. 04/03/2014 -- She continues to have problems with her transport and has only been able to come back to see [place] in 2 weeks' time. History of Present Illness (HPI) The following HPI elements were documented for the patient's wound: Location: right lower extremity Quality: minimal pain Duration: several weeks Associated Signs and Symptoms: no fever The patient returns for follow-up today. She has been very compliant with her dressings. Denies any fevers at home. says she is pleased with the way her wound is healing and she has no fresh problems. 05/15/2014 -- patient still continues to have transport problems and is unable to come to see as regularly. Because of this we cannot apply a compression wrap and the other situation is that she  uses a ultrasound machine for his wound stimulation of a her bone as per the orthopedic doctors. Objective Constitutional Pulse regular. Respirations normal and unlabored. Afebrile. ZAKIRA, RESSEL. ([id number]) Eyes Nonicteric. Reactive to light. Ears, Nose, Mouth, and Throat Lips, teeth, and gums WNL.Marland Kitchen Moist mucosa without lesions . Neck supple and nontender. No palpable supraclavicular or cervical adenopathy. Normal sized without goiter. Respiratory WNL. No retractions.. Cardiovascular Pedal Pulses WNL. he still has minimal edema in the right lower extremity +1 pitting. Psychiatric Judgement and insight Intact.. No evidence of depression, anxiety, or agitation.. Integumentary (Hair, Skin) she has some eschar and fibrin surrounding her wound but otherwise is looking very healthy.. No crepitus or fluctuance. No peri-wound warmth or erythema. No masses.. Wound #1 status is Open. Original cause of wound was Surgical Injury. The wound is located on the Right,Lateral Lower Leg. The wound measures 1.3cm length x 0.5cm width x 0.1cm depth; 0.511cm^2 area and 0.051cm^3 volume. 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 There was a Non-Viable Tissue Open Wound/Selective [phone]) debridement with total area of 0.5 sq cm performed by Korben Carcione, Ignacia Felling., MD. with the following instrument(s): Forceps and Scissors to remove Non- Viable tissue/material including Exudate, Fibrin/Slough, and Skin after achieving pain control using TINIA, ORAVEC. ([phone]) Lidocaine 5% topical ointment. A time out was conducted prior to the start of the procedure. A Minimum amount of bleeding was controlled with Pressure. The procedure was tolerated well with a pain level of 0 throughout and a pain level of 0 following the procedure.  Post Debridement Measurements: 1.3cm length x 0.5cm width x 0.1cm depth; 0.051cm^3 volume. Plan Wound Cleansing: Wound #1 Right,Lateral Lower Leg: Clean wound with Normal Saline. - may use mild soap and warm water Anesthetic: Wound #1 Right,Lateral Lower Leg: Topical Lidocaine 4% cream applied to wound bed prior to debridement Primary Wound Dressing: Wound #1 Right,Lateral Lower Leg: Prisma Ag Secondary Dressing: Wound #1 Right,Lateral Lower Leg: Gauze and Kerlix/Conform Dressing Change Frequency: Wound #1 Right,Lateral Lower Leg: Change Dressing Monday, Wednesday, Friday Follow-up Appointments: Wound #1 Right,Lateral Lower Leg: Return Appointment in 2 weeks. Edema Control: Wound #1 Right,Lateral Lower Leg: Tubigrip - double layer Additional Orders / Instructions: Wound #1 Right,Lateral Lower Leg: Increase protein intake. Activity as tolerated Home Health: Wound #1 Right,Lateral Lower Leg: [facility] She has made sufficient progress that we will change her dressing to Prisma AG and continue to use a double Tubigrip  to help with the compression. she is unable to use a Coban light as she has problems with nursing help and also needs to use an ultrasounds stimulator as per orthopedic doctors. SHAQUNA, GEIGLE ([id number]) She will come and see me when she has transport arranged hopefully in a week's time. Electronic Signature(s) Signed: 05/29/2014 2:25:04 PM By: Evlyn Kanner MD, FACS Entered By: Evlyn Kanner on 05/29/2014 11:52:36 Savannah Shawl ([id number]) -------------------------------------------------------------------------------- SuperBill Details Patient Name: TEAGYN, FISHEL. Date of Service: 05/29/2014 Medical Record Number: [id number] Patient Account Number: [id number] Date of Birth/Sex: [birth year] ([name]) Treating RN: Primary Care Physician: Hinda Lenis Other Clinician: Referring Physician: Alberteen Spindle Treating Physician/Extender: Rudene Re in Treatment: 25 Diagnosis Coding ICD-10 Codes Code Description T81.30XA Disruption of wound, unspecified, initial encounter Facility Procedures CPT4 Code: [id number] Description: [phone] - DEBRIDE WOUND 1ST 20 SQ CM OR < ICD-10 Description Diagnosis T81.30XA Disruption of wound, unspecified, initial encount Modifier: er Quantity: 1 Physician Procedures CPT4 Code: [id number] Description: 97597 - WC PHYS DEBR WO ANESTH 20 SQ CM ICD-10 Description Diagnosis T81.30XA Disruption of wound, unspecified, initial encount Modifier: er Quantity: 1 Electronic Signature(s) Signed: 05/29/2014 2:25:04 PM By: Evlyn Kanner MD, FACS Entered By: Evlyn Kanner on 05/29/2014 11:54:17

## 2014-05-29 NOTE — Progress Notes (Signed)
Savannah Key, Denna M. ([id number]) Visit Report for 05/29/2014 Arrival Information Details Patient Name: Savannah Key, Savannah M. Date of Service: 05/29/2014 11:00 AM Medical Record Number: [id number] Patient Account Number: [id number] Date of Birth/Sex: 10/26/1940 ([name]) Treating RN: Afful, RN, BSN, [place] Sink[place] Primary Care Physician: Hinda LenisEILLY, SHARON Other Clinician: Referring Physician: Alberteen SpindleALLEN, COLETTE Treating Physician/Extender: Rudene ReBritto, Errol Weeks in Treatment: 25 Visit Information History Since Last Visit Any new allergies or adverse reactions: No Patient Arrived: Dan HumphreysWalker Had a fall or experienced change in No Arrival Time: 11:17 activities of daily living that may affect Accompanied By: self risk of falls: Transfer Assistance: None Signs or symptoms of abuse/neglect since last No Patient Identification Verified: Yes visito Secondary Verification Process Completed: Yes Hospitalized since last visit: No Patient Requires Transmission-Based No Has Dressing in Place as Prescribed: Yes Precautions: Pain Present Now: No Patient Has Alerts: No Electronic Signature(s) Signed: 05/29/2014 4:30:42 PM By: Elpidio EricAfful, Rita BSN, RN Entered By: Elpidio EricAfful, Rita on 05/29/2014 11:19:32 Savannah Key, [name] ([id number]) -------------------------------------------------------------------------------- Encounter Discharge Information Details Patient Name: Savannah Key, [name] Date of Service: 05/29/2014 11:00 AM Medical Record Number: [id number] Patient Account Number: [id number] Date of Birth/Sex: 05/26/1940 ([name]) Treating RN: Primary Care Physician: Hinda LenisEILLY, SHARON Other Clinician: Referring Physician: Alberteen SpindleALLEN, COLETTE Treating Physician/Extender: Rudene ReBritto, Errol Weeks in Treatment: 25 Encounter Discharge Information Items Schedule Follow-up Appointment: No Medication Reconciliation completed No and provided to Patient/Care Eri Mcevers: Provided on Clinical Summary of Care: 05/29/2014 Form Type Recipient Paper  Patient LS Electronic Signature(s) Signed: 05/29/2014 11:42:18 AM By: Gwenlyn PerkingMoore, Shelia Entered By: Gwenlyn PerkingMoore, Shelia on 05/29/2014 11:42:16 Harlacher, Doralee AlbinoLINDA M. ([id number]) -------------------------------------------------------------------------------- Lower Extremity Assessment Details Patient Name: Savannah Key, Savannah M. Date of Service: 05/29/2014 11:00 AM Medical Record Number: [id number] Patient Account Number: [id number] Date of Birth/Sex: 07/31/1940 ([name]) Treating RN: Afful, RN, BSN,  [place] Primary Care Physician: Hinda LenisEILLY, SHARON Other Clinician: Referring Physician: Alberteen SpindleALLEN, COLETTE Treating Physician/Extender: Rudene ReBritto, Errol Weeks in Treatment: 25 Edema Assessment Assessed: [Left: No] [Right: No] E[Left: dema] [Right: :] Calf Left: Right: Point of Measurement: 28 cm From Medial Instep cm 38 cm Ankle Left: Right: Point of Measurement: 10 cm From Medial Instep cm 23.4 cm Vascular Assessment Claudication: Claudication Assessment [Right:None] Pulses: Posterior Tibial Dorsalis Pedis Palpable: [Right:Yes] Extremity colors, hair growth, and conditions: Extremity Color: [Right:Normal] Hair Growth on Extremity: [Right:No] Temperature of Extremity: [Right:Warm] Capillary Refill: [Right:< 3 seconds] Dependent Rubor: [Right:No] Blanched when Elevated: [Right:No] Lipodermatosclerosis: [Right:No] Toe Nail Assessment Left: Right: Thick: No Discolored: No Deformed: No Improper Length and Hygiene: No Savannah Key, [name] ([id number]) Electronic Signature(s) Signed: 05/29/2014 4:30:42 PM By: Elpidio EricAfful, Rita BSN, RN Entered By: Elpidio EricAfful, Rita on 05/29/2014 11:23:52 Savannah Key, [name] ([id number]) -------------------------------------------------------------------------------- Multi Wound Chart Details Patient Name: Savannah Key, Savannah M. Date of Service: 05/29/2014 11:00 AM Medical Record Number: [id number] Patient Account Number: [id number] Date of Birth/Sex: 04/19/1940 ([name]) Treating RN: Renee HarderMabry,  Kelsey Primary Care Physician: Hinda LenisEILLY, SHARON Other Clinician: Referring Physician: Alberteen SpindleALLEN, COLETTE Treating Physician/Extender: Rudene ReBritto, Errol Weeks in Treatment: 25 Photos: [1:No Photos] [N/A:N/A] Wound Location: [1:Right, Lateral Lower Leg] [N/A:N/A] Wounding Event: [1:Surgical Injury] [N/A:N/A] Primary Etiology: [1:Diabetic Wound/Ulcer of the Lower Extremity] [N/A:N/A] Date Acquired: [1:10/19/2013] [N/A:N/A] Weeks of Treatment: [1:25] [N/A:N/A] Wound Status: [1:Open] [N/A:N/A] Measurements L x W x D 1.3x0.5x0.1 [N/A:N/A] (cm) Area (cm) : [1:0.511] [N/A:N/A] Volume (cm) : [1:0.051] [N/A:N/A] % Reduction in Area: [1:99.60%] [N/A:N/A] % Reduction in Volume: 99.90% [N/A:N/A] Classification: [1:Grade 1] [N/A:N/A] Periwound Skin Texture: No Abnormalities Noted [N/A:N/A] Periwound Skin [1:No Abnormalities Noted] [N/A:N/A] Moisture: Periwound Skin Color:  No Abnormalities Noted [N/A:N/A] Tenderness on [1:No] [N/A:N/A] Treatment Notes Electronic Signature(s) Signed: 05/29/2014 4:31:45 PM By: Renee Harder RN Entered By: Renee Harder on 05/29/2014 11:34:05 Gerhold, [name] ([id number]) -------------------------------------------------------------------------------- Multi-Disciplinary Care Plan Details Patient Name: Savannah Key. Date of Service: 05/29/2014 11:00 AM Medical Record Number: [id number] Patient Account Number: [id number] Date of Birth/Sex: Sep 26, 1940 ([name]) Treating RN: Renee Harder Primary Care Physician: Hinda Lenis Other Clinician: Referring Physician: Alberteen Spindle Treating Physician/Extender: Rudene Re in Treatment: 25 Active Inactive Abuse / Safety / Falls / Self Care Management Nursing Diagnoses: Potential for falls Goals: Patient will remain injury free Date Initiated: 12/05/2013 Goal Status: Active Patient/caregiver will verbalize/demonstrate measures taken to prevent injury and/or falls Date Initiated: 12/05/2013 Goal Status:  Active Interventions: Assess fall risk on admission and as needed Provide education on personal and home safety Provide education on safe transfers Notes: Necrotic Tissue Nursing Diagnoses: Impaired tissue integrity related to necrotic/devitalized tissue Goals: Necrotic/devitalized tissue will be minimized in the wound bed Date Initiated: 12/05/2013 Goal Status: Active Interventions: Assess patient pain level pre-, during and post procedure and prior to discharge Provide education on necrotic tissue and debridement process Treatment Activities: Apply topical anesthetic as ordered : 05/29/2014 Enzymatic debridement : 05/29/2014 CYAN, CLIPPINGER ([id number]) Excisional debridement : 05/29/2014 Notes: Orientation to the Wound Care Program Nursing Diagnoses: Knowledge deficit related to the wound healing center program Goals: Patient/caregiver will verbalize understanding of the Wound Healing Center Program Date Initiated: 12/05/2013 Goal Status: Active Interventions: Provide education on orientation to the wound center Notes: Wound/Skin Impairment Nursing Diagnoses: Impaired tissue integrity Goals: Ulcer/skin breakdown will heal within 14 weeks Date Initiated: 12/05/2013 Goal Status: Active Interventions: Assess patient/caregiver ability to perform ulcer/skin care regimen upon admission and as needed Assess ulceration(s) every visit Provide education on ulcer and skin care Notes: Electronic Signature(s) Signed: 05/29/2014 4:31:45 PM By: Renee Harder RN Entered By: Renee Harder on 05/29/2014 11:32:19 Jozwiak, Murial M. ([id number]) -------------------------------------------------------------------------------- Pain Assessment Details Patient Name: LEIDA, LUTON. Date of Service: 05/29/2014 11:00 AM Medical Record Number: [id number] Patient Account Number: [id number] Date of Birth/Sex: February 04, 1940 ([name]) Treating RN: Clover Mealy, RN, BSN, [place] Sink Primary Care Physician:  Hinda Lenis Other Clinician: Referring Physician: Alberteen Spindle Treating Physician/Extender: Rudene Re in Treatment: 25 Active Problems Location of Pain Severity and Description of Pain Patient Has Paino No Site Locations Pain Management and Medication Current Pain Management: Electronic Signature(s) Signed: 05/29/2014 4:30:42 PM By: Elpidio Eric BSN, RN Entered By: Elpidio Eric on 05/29/2014 11:20:55 Savannah Shawl ([id number]) -------------------------------------------------------------------------------- Wound Assessment Details Patient Name: TAYANA, SHANKLE. Date of Service: 05/29/2014 11:00 AM Medical Record Number: [id number] Patient Account Number: [id number] Date of Birth/Sex: 10/04/1940 ([name]) Treating RN: Afful, RN, BSN, [place] Sink Primary Care Physician: Hinda Lenis Other Clinician: Referring Physician: Alberteen Spindle Treating Physician/Extender: Rudene Re in Treatment: 25 Wound Status Wound Number: 1 Primary Diabetic Wound/Ulcer of the Lower Etiology: Extremity Wound Location: Right, Lateral Lower Leg Wound Status: Open Wounding Event: Surgical Injury Date Acquired: 10/19/2013 Weeks Of Treatment: 25 Clustered Wound: No Photos Photo Uploaded By: Elpidio Eric on 05/29/2014 13:23:50 Wound Measurements Length: (cm) 1.3 Width: (cm) 0.5 Depth: (cm) 0.1 Area: (cm) 0.511 Volume: (cm) 0.051 % Reduction in Area: 99.6% % Reduction in Volume: 99.9% Wound Description Classification: Grade 1 Periwound Skin Texture Texture Color No Abnormalities Noted: No No Abnormalities Noted: No Moisture No Abnormalities Noted: No Electronic Signature(s) Signed: 05/29/2014 4:30:42 PM By: Elpidio Eric BSN, RN Mcnerney, Noura M. (  [id number]) Entered By: Elpidio EricAfful, Rita on 05/29/2014 11:26:20 Savannah Key, Vella M. ([id number]) -------------------------------------------------------------------------------- Vitals Details Patient Name: Savannah Key, Jeanni M. Date of  Service: 05/29/2014 11:00 AM Medical Record Number: [id number] Patient Account Number: [id number] Date of Birth/Sex: 07/20/1940 ([name]) Treating RN: Clover MealyAfful, RN, BSN, [place] Sinkita Primary Care Physician: Hinda LenisEILLY, SHARON Other Clinician: Referring Physician: Alberteen SpindleALLEN, COLETTE Treating Physician/Extender: Rudene ReBritto, Errol Weeks in Treatment: 25 Vital Signs Reference Range: 80 - 120 mg / dl Electronic Signature(s) Signed: 05/29/2014 4:30:42 PM By: Elpidio EricAfful, Rita BSN, RN Entered By: Elpidio EricAfful, Rita on 05/29/2014 11:21:46

## 2014-06-11 ENCOUNTER — Encounter
Admission: RE | Disposition: A | Discharge status: Home / Self Care | Payer: Medicare (Managed Care) | Source: Ambulatory Visit | Attending: Vascular Surgery

## 2014-06-11 ENCOUNTER — Encounter: Payer: Self-pay | Admitting: *Deleted

## 2014-06-11 ENCOUNTER — Ambulatory Visit
Admission: RE | Admit: 2014-06-11 | Discharge: 2014-06-11 | Disposition: A | Discharge status: Home / Self Care | Payer: Medicare (Managed Care) | Source: Ambulatory Visit | Attending: Vascular Surgery | Admitting: Vascular Surgery

## 2014-06-11 DIAGNOSIS — R0989 Other specified symptoms and signs involving the circulatory and respiratory systems: Secondary | ICD-10-CM | POA: Insufficient documentation

## 2014-06-11 DIAGNOSIS — Z992 Dependence on renal dialysis: Secondary | ICD-10-CM | POA: Insufficient documentation

## 2014-06-11 DIAGNOSIS — N186 End stage renal disease: Secondary | ICD-10-CM | POA: Insufficient documentation

## 2014-06-11 DIAGNOSIS — T82858A Stenosis of vascular prosthetic devices, implants and grafts, initial encounter: Secondary | ICD-10-CM | POA: Diagnosis present

## 2014-06-11 DIAGNOSIS — E785 Hyperlipidemia, unspecified: Secondary | ICD-10-CM | POA: Diagnosis not present

## 2014-06-11 DIAGNOSIS — T82868A Thrombosis of vascular prosthetic devices, implants and grafts, initial encounter: Secondary | ICD-10-CM | POA: Insufficient documentation

## 2014-06-11 DIAGNOSIS — I6529 Occlusion and stenosis of unspecified carotid artery: Secondary | ICD-10-CM | POA: Insufficient documentation

## 2014-06-11 DIAGNOSIS — E119 Type 2 diabetes mellitus without complications: Secondary | ICD-10-CM | POA: Insufficient documentation

## 2014-06-11 DIAGNOSIS — Z87891 Personal history of nicotine dependence: Secondary | ICD-10-CM | POA: Diagnosis not present

## 2014-06-11 DIAGNOSIS — I519 Heart disease, unspecified: Secondary | ICD-10-CM | POA: Diagnosis not present

## 2014-06-11 DIAGNOSIS — Y832 Surgical operation with anastomosis, bypass or graft as the cause of abnormal reaction of the patient, or of later complication, without mention of misadventure at the time of the procedure: Secondary | ICD-10-CM | POA: Insufficient documentation

## 2014-06-11 HISTORY — DX: Gastro-esophageal reflux disease without esophagitis: K21.9

## 2014-06-11 HISTORY — DX: Chronic kidney disease, unspecified: N18.9

## 2014-06-11 HISTORY — DX: Unspecified osteoarthritis, unspecified site: M19.90

## 2014-06-11 HISTORY — DX: Major depressive disorder, single episode, unspecified: F32.9

## 2014-06-11 HISTORY — DX: Atherosclerotic heart disease of native coronary artery without angina pectoris: I25.10

## 2014-06-11 HISTORY — DX: Type 2 diabetes mellitus without complications: E11.9

## 2014-06-11 HISTORY — PX: PERIPHERAL VASCULAR CATHETERIZATION: SHX172C

## 2014-06-11 HISTORY — DX: Occlusion and stenosis of unspecified carotid artery: I65.29

## 2014-06-11 HISTORY — DX: Hypothyroidism, unspecified: E03.9

## 2014-06-11 HISTORY — DX: Depression, unspecified: F32.A

## 2014-06-11 LAB — POTASSIUM (ARMC VASCULAR LAB ONLY): POTASSIUM (ARMC VASCULAR LAB): 4.4

## 2014-06-11 SURGERY — A/V SHUNTOGRAM/FISTULAGRAM
Anesthesia: Moderate Sedation

## 2014-06-11 MED ORDER — LIDOCAINE-EPINEPHRINE (PF) 1 %-1:200000 IJ SOLN
INTRAMUSCULAR | Status: AC
  Filled 2014-06-11: qty 30

## 2014-06-11 MED ORDER — HEPARIN (PORCINE) IN NACL 2-0.9 UNIT/ML-% IJ SOLN
INTRAMUSCULAR | Status: AC
  Filled 2014-06-11: qty 1000

## 2014-06-11 MED ORDER — MIDAZOLAM HCL 5 MG/5ML IJ SOLN
INTRAMUSCULAR | Status: AC
  Filled 2014-06-11: qty 5

## 2014-06-11 MED ORDER — FENTANYL CITRATE (PF) 100 MCG/2ML IJ SOLN
INTRAMUSCULAR | Status: AC
  Filled 2014-06-11: qty 2

## 2014-06-11 MED ORDER — CEFAZOLIN SODIUM 1-5 GM-% IV SOLN
INTRAVENOUS | Status: AC
  Filled 2014-06-11: qty 50

## 2014-06-11 MED ORDER — FENTANYL CITRATE (PF) 100 MCG/2ML IJ SOLN
INTRAMUSCULAR | Status: DC | PRN
  Administered 2014-06-11 (×2): 50 ug via INTRAVENOUS

## 2014-06-11 MED ORDER — IOHEXOL 300 MG/ML  SOLN
INTRAMUSCULAR | Status: DC | PRN
  Administered 2014-06-11: 25 mL via INTRAVENOUS

## 2014-06-11 MED ORDER — CEFAZOLIN SODIUM 1-5 GM-% IV SOLN
1.0000 g | Freq: Once | INTRAVENOUS | Status: AC
  Administered 2014-06-11: 1 g via INTRAVENOUS

## 2014-06-11 MED ORDER — CEFAZOLIN (ANCEF) 1 G IV SOLR
1.0000 g | Freq: Once | INTRAVENOUS | Status: DC
  Filled 2014-06-11: qty 1

## 2014-06-11 MED ORDER — HEPARIN SODIUM (PORCINE) 1000 UNIT/ML IJ SOLN
INTRAMUSCULAR | Status: DC | PRN
Start: 2014-06-11 — End: 2014-06-11
  Administered 2014-06-11: 3000 [IU] via INTRAVENOUS

## 2014-06-11 MED ORDER — HEPARIN SODIUM (PORCINE) 1000 UNIT/ML IJ SOLN
INTRAMUSCULAR | Status: AC
  Filled 2014-06-11: qty 1

## 2014-06-11 MED ORDER — SODIUM CHLORIDE 0.9 % IV SOLN
INTRAVENOUS | Status: DC
  Administered 2014-06-11: 09:00:00 via INTRAVENOUS

## 2014-06-11 MED ORDER — MIDAZOLAM HCL 2 MG/2ML IJ SOLN
INTRAMUSCULAR | Status: DC | PRN
  Administered 2014-06-11: 2 mg via INTRAVENOUS
  Administered 2014-06-11: 1 mg via INTRAVENOUS

## 2014-06-11 SURGICAL SUPPLY — 7 items
BALLN DORADO 7X80X80 (BALLOONS) ×4
BALLOON DORADO 7X80X80 (BALLOONS) ×2 IMPLANT
KIT 5FR STIFF NT/TG (MISCELLANEOUS) ×4 IMPLANT
PACK ANGIOGRAPHY (CUSTOM PROCEDURE TRAY) ×4 IMPLANT
SHEATH BRITE TIP 6FRX5.5 (SHEATH) ×4 IMPLANT
TOWEL OR 17X26 4PK STRL BLUE (TOWEL DISPOSABLE) ×4 IMPLANT
WIRE MAGIC TOR.035 180C (WIRE) ×4 IMPLANT

## 2014-06-11 NOTE — Progress Notes (Signed)
Pt doing well post procedure bruit and thril noted post procedure, taking po's well, alert and oriented

## 2014-06-11 NOTE — Op Note (Signed)
[facility]    OPERATIVE NOTE   PROCEDURE: 1.  Left brachial artery to axillary vein arteriovenous graft cannulation under ultrasound guidance 2.  Left arm shuntogram 3.  Percutaneous transluminal angioplasty of mid graft and venous access site with 7 mm diameter high pressure angioplasty balloon  PRE-OPERATIVE DIAGNOSIS: 1. ESRD 2. 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 70% stenosis in mid to distal graft resolved with angioplasty  SPECIMEN(S):  None  CONTRAST: 25 cc  INDICATIONS: Savannah Key is a 74 y.o. female who presents with malfunctioning left brachial artery to axillary vein arteriovenous graft.  The patient is scheduled for  left arm shuntogram.  The patient is aware the risks include but are not limited to: bleeding, infection, thrombosis of the cannulated access, and possible anaphylactic reaction to the contrast.  The patient is aware of the risks of the procedure and elects to proceed forward.  DESCRIPTION: After full informed written consent was obtained, the patient was brought back to the angiography suite and placed supine upon the angiography table.  The patient was connected to monitoring equipment.  The  left arm was prepped and draped in the standard fashion for a percutaneous access intervention.  Under ultrasound guidance, the left brachial artery to axillary vein arteriovenous graft was cannulated with a micropuncture needle under direct ultrasound guidance in an antegrade fashion not far from the arterial anastomosis  and a permanent image was performed.  The microwire was advanced into the fistula and the needle was exchanged for the a microsheath.  I then upsized to a 6 Fr Sheath and imaging was performed.  Hand injections were completed to image the access  including the central venous system. This demonstrated and a proximally 70% stenosis over several centimeters near the venous access site in the mid to distal graft.  Based on the images, this patient will need treatment of this area to improve the function of the graft and maintain its patency. I then gave the patient 3000 units of intravenous heparin.  I then crossed the stenosis with a Magic Tourqe wire.  Based on the imaging, a 7 mm x 8 cm  high pressure angioplasty balloon was selected.  The balloon was centered around the stenosis and inflated to 20 ATM for 1 minute(s).  On completion imaging, a less than 10%% residual stenosis was present.     Based on the completion imaging, no further intervention is necessary.  The wire and balloon were removed from the sheath.  A 4-0 Monocryl purse-string suture was sewn around the sheath.  The sheath was removed while tying down the suture.  A sterile bandage was applied to the puncture site.  COMPLICATIONS: None  CONDITION: Stable   DEW,JASON  06/11/2014 9:57 AM

## 2014-06-11 NOTE — Discharge Instructions (Signed)
Fistulogram, Care After °Refer to this sheet in the next few weeks. These instructions provide you with information on caring for yourself after your procedure. Your health care provider may also give you more specific instructions. Your treatment has been planned according to current medical practices, but problems sometimes occur. Call your health care provider if you have any problems or questions after your procedure. °WHAT TO EXPECT AFTER THE PROCEDURE °After your procedure, it is typical to have the following: °· A small amount of discomfort in the area where the catheters were placed. °· A small amount of bruising around the fistula. °· Sleepiness and fatigue. °HOME CARE INSTRUCTIONS °· Rest at home for the day following your procedure. °· Do not drive or operate heavy machinery while taking pain medicine. °· Take medicines only as directed by your health care provider. °· Do not take baths, swim, or use a hot tub until your health care provider approves. You may shower 24 hours after the procedure or as directed by your health care provider. °· There are many different ways to close and cover an incision, including stitches, skin glue, and adhesive strips. Follow your health care provider's instructions on: °· Incision care. °· Bandage (dressing) changes and removal. °· Incision closure removal. °· Monitor your dialysis fistula carefully. °SEEK MEDICAL CARE IF: °· You have drainage, redness, swelling, or pain at your catheter site. °· You have a fever. °· You have chills. °SEEK IMMEDIATE MEDICAL CARE IF: °· You feel weak. °· You have trouble balancing. °· You have trouble moving your arms or legs. °· You have problems with your speech or vision. °· You can no longer feel a vibration or buzz when you put your fingers over your dialysis fistula. °· The limb that was used for the procedure: °· Swells. °· Is painful. °· Is cold. °· Is discolored, such as blue or pale white. °Document Released: 05/22/2013  Document Reviewed: 02/24/2013 °Exit[employer]® Patient Information ©2015 [employer]. This information is not intended to replace advice given to you by your health care provider. Make sure you discuss any questions you have with your health care provider. °Angiogram, Care After °Refer to this sheet in the next few weeks. These instructions provide you with information on caring for yourself after your procedure. Your health care provider may also give you more specific instructions. Your treatment has been planned according to current medical practices, but problems sometimes occur. Call your health care provider if you have any problems or questions after your procedure.  °WHAT TO EXPECT AFTER THE PROCEDURE °After your procedure, it is typical to have the following sensations: °· Minor discomfort or tenderness and a small bump at the catheter insertion site. The bump should usually decrease in size and tenderness within 1 to 2 weeks. °· Any bruising will usually fade within 2 to 4 weeks. °HOME CARE INSTRUCTIONS  °· You may need to keep taking blood thinners if they were prescribed for you. Take medicines only as directed by your health care provider. °· Do not apply powder or lotion to the site. °· Do not take baths, swim, or use a hot tub until your health care provider approves. °· You may shower 24 hours after the procedure. Remove the bandage (dressing) and gently wash the site with plain soap and water. Gently pat the site dry. °· Inspect the site at least twice daily. °· Limit your activity for the first 48 hours. Do not bend, squat, or lift anything over 20 lb (9   kg) or as directed by your health care provider. °· Plan to have someone take you home after the procedure. Follow instructions about when you can drive or return to work. °SEEK MEDICAL CARE IF: °· You get light-headed when standing up. °· You have drainage (other than a small amount of blood on the dressing). °· You have chills. °· You have a  fever. °· You have redness, warmth, swelling, or pain at the insertion site. °SEEK IMMEDIATE MEDICAL CARE IF:  °· You develop chest pain or shortness of breath, feel faint, or pass out. °· You have bleeding, swelling larger than a walnut, or drainage from the catheter insertion site. °· You develop pain, discoloration, coldness, or severe bruising in the leg or arm that held the catheter. °· You develop bleeding from any other place, such as the bowels. You may see bright red blood in your urine or stools, or your stools may appear black and tarry. °· You have heavy bleeding from the site. If this happens, hold pressure on the site. °MAKE SURE YOU: °· Understand these instructions. °· Will watch your condition. °· Will get help right away if you are not doing well or get worse. °Document Released: 07/24/2004 Document Revised: 05/22/2013 Document Reviewed: 05/30/2012 °ExitCare® Patient Information ©2015 [employer]. This information is not intended to replace advice given to you by your health care provider. Make sure you discuss any questions you have with your health care provider. ° °

## 2014-06-11 NOTE — H&P (Signed)
[facility] History & Physical Update  The patient was interviewed and re-examined.  The patient's previous History and Physical has been reviewed and is unchanged.  There is no change in the plan of care.  DEW,JASON, MD  06/11/2014, 8:09 AM

## 2014-06-12 ENCOUNTER — Ambulatory Visit: Payer: Medicare Other | Admitting: Surgery

## 2014-06-12 ENCOUNTER — Encounter: Payer: Self-pay | Admitting: Vascular Surgery

## 2014-06-26 ENCOUNTER — Encounter: Payer: Medicare (Managed Care) | Attending: Surgery | Admitting: Surgery

## 2014-06-26 DIAGNOSIS — S81801A Unspecified open wound, right lower leg, initial encounter: Secondary | ICD-10-CM | POA: Diagnosis not present

## 2014-06-26 DIAGNOSIS — X58XXXA Exposure to other specified factors, initial encounter: Secondary | ICD-10-CM | POA: Insufficient documentation

## 2014-06-26 DIAGNOSIS — T8130XA Disruption of wound, unspecified, initial encounter: Secondary | ICD-10-CM | POA: Diagnosis present

## 2014-06-26 NOTE — Progress Notes (Signed)
ANAILY, ASHBAUGH ([id number]) Visit Report for 06/26/2014 Chief Complaint Document Details Patient Name: Savannah, Key. Date of Service: 06/26/2014 11:00 AM Medical Record Number: [id number] Patient Account Number: [id number] Date of Birth/Sex: 25-Apr-1940 ([name]) Treating RN: Primary Care Physician: Hinda Lenis Other Clinician: Referring Physician: Hinda Lenis Treating Physician/Extender: Rudene Re in Treatment: 29 Information Obtained from: Patient Chief Complaint post surgical wound infection right lower extremity. The staples were removed and the wound opened out. she says she is doing fine and has no fresh issues. 03/20/14 -- was unable to come for the last several days due to transport problems. She has been doing fine otherwise and doing addressing as required. 04/17/2014 -- she continues to depend on her transportation and if this is erratic she doesn't come to see as an couple of weeks. She has had no fresh issues in the last 2 weeks we have not seen her. 04/03/2014 -- She continues to have problems with her transport and has only been able to come back to see [place] in 2 weeks' time. Electronic Signature(s) Signed: 06/26/2014 12:36:02 PM By: Evlyn Kanner MD, FACS Entered By: Evlyn Kanner on 06/26/2014 11:38:35 Savannah Shawl ([id number]) -------------------------------------------------------------------------------- HPI Details Patient Name: Savannah, Key. Date of Service: 06/26/2014 11:00 AM Medical Record Number: [id number] Patient Account Number: [id number] Date of Birth/Sex: [birth year] ([name]) Treating RN: Primary Care Physician: Hinda Lenis Other Clinician: Referring Physician: Hinda Lenis Treating Physician/Extender: Rudene Re in Treatment: 29 History of Present Illness Location: right lower extremity Quality: minimal pain Duration: several weeks Associated Signs and Symptoms: no fever HPI Description: The patient returns  for follow-up today. She has been very compliant with her dressings. Denies any fevers at home. says she is pleased with the way her wound is healing and she has no fresh problems. 05/15/2014 -- patient still continues to have transport problems and is unable to come to see as regularly. Because of this we cannot apply a compression wrap and the other situation is that she uses a ultrasound machine for his wound stimulation of a her bone as per the orthopedic doctors. Electronic Signature(s) Signed: 06/26/2014 12:36:02 PM By: Evlyn Kanner MD, FACS Entered By: Evlyn Kanner on 06/26/2014 11:38:41 Savannah Shawl ([id number]) -------------------------------------------------------------------------------- Physical Exam Details Patient Name: Savannah, Key. Date of Service: 06/26/2014 11:00 AM Medical Record Number: [id number] Patient Account Number: [id number] Date of Birth/Sex: [birth year] ([name]) Treating RN: Primary Care Physician: Hinda Lenis Other Clinician: Referring Physician: Hinda Lenis Treating Physician/Extender: Rudene Re in Treatment: 29 Constitutional . Pulse regular. Respirations normal and unlabored. Afebrile. . Eyes Nonicteric. Reactive to light. Ears, Nose, Mouth, and Throat Lips, teeth, and gums WNL.Marland Kitchen Moist mucosa without lesions . Neck supple and nontender. No palpable supraclavicular or cervical adenopathy. Normal sized without goiter. Respiratory WNL. No retractions.. Cardiovascular Pedal Pulses WNL. No clubbing, cyanosis but has +2 edema of the right lower extremity. Chest Breasts symmetical and no nipple discharge.. Breast tissue WNL, no masses, lumps, or tenderness.. Musculoskeletal Adexa without tenderness or enlargement.. Digits and nails w/o clubbing, cyanosis, infection, petechiae, ischemia, or inflammatory conditions.. Integumentary (Hair, Skin) No suspicious lesions. ulcer on the right lower extremity is completely healed. No  crepitus or fluctuance. No peri-wound warmth or erythema. No masses.Marland Kitchen Psychiatric Judgement and insight Intact.. No evidence of depression, anxiety, or agitation.. Electronic Signature(s) Signed: 06/26/2014 12:36:02 PM By: Evlyn Kanner MD, FACS Entered By: Evlyn Kanner on 06/26/2014 11:39:26 Savannah Shawl ([id number]) -------------------------------------------------------------------------------- Physician Orders Details  Patient Name: Savannah, Key. Date of Service: 06/26/2014 11:00 AM Medical Record Number: [id number] Patient Account Number: [id number] Date of Birth/Sex: Aug 23, 1940 ([name]) Treating RN: Renee Harder Primary Care Physician: Hinda Lenis Other Clinician: Referring Physician: Hinda Lenis Treating Physician/Extender: Rudene Re in Treatment: 58 Verbal / Phone Orders: Yes Clinician: Renee Harder Read Back and Verified: Yes Diagnosis Coding Edema Control o Support Garment 20-30 mm/Hg pressure to: - if needed for edema control o Other: - elevate legs Discharge From [facility] Services o Discharge from Wound Care Center Electronic Signature(s) Signed: 06/26/2014 12:36:02 PM By: Evlyn Kanner MD, FACS Signed: 06/26/2014 4:43:53 PM By: Renee Harder RN Entered By: Renee Harder on 06/26/2014 11:55:02 Mirabella, Savannah M. ([id number]) -------------------------------------------------------------------------------- Problem List Details Patient Name: Savannah, Key. Date of Service: 06/26/2014 11:00 AM Medical Record Number: [id number] Patient Account Number: [id number] Date of Birth/Sex: 11-02-1940 ([name]) Treating RN: Primary Care Physician: Hinda Lenis Other Clinician: Referring Physician: Hinda Lenis Treating Physician/Extender: Rudene Re in Treatment: 29 Active Problems ICD-10 Encounter Code Description Active Date Diagnosis T81.30XA Disruption of wound, unspecified, initial encounter 12/05/2013 Yes Inactive  Problems Resolved Problems Electronic Signature(s) Signed: 06/26/2014 12:36:02 PM By: Evlyn Kanner MD, FACS Entered By: Evlyn Kanner on 06/26/2014 11:38:27 Savannah Key, Savannah Key ([id number]) -------------------------------------------------------------------------------- Progress Note Details Patient Name: Savannah, Key. Date of Service: 06/26/2014 11:00 AM Medical Record Number: [id number] Patient Account Number: [id number] Date of Birth/Sex: Jun 23, 1940 (73 y.o. Female) Treating RN: Primary Care Physician: Hinda Lenis Other Clinician: Referring Physician: Hinda Lenis Treating Physician/Extender: Rudene Re in Treatment: 29 Subjective Chief Complaint Information obtained from Patient post surgical wound infection right lower extremity. The staples were removed and the wound opened out. she says she is doing fine and has no fresh issues. 03/20/14 -- was unable to come for the last several days due to transport problems. She has been doing fine otherwise and doing addressing as required. 04/17/2014 -- she continues to depend on her transportation and if this is erratic she doesn't come to see as an couple of weeks. She has had no fresh issues in the last 2 weeks we have not seen her. 04/03/2014 -- She continues to have problems with her transport and has only been able to come back to see [place] in 2 weeks' time. History of Present Illness (HPI) The following HPI elements were documented for the patient's wound: Location: right lower extremity Quality: minimal pain Duration: several weeks Associated Signs and Symptoms: no fever The patient returns for follow-up today. She has been very compliant with her dressings. Denies any fevers at home. says she is pleased with the way her wound is healing and she has no fresh problems. 05/15/2014 -- patient still continues to have transport problems and is unable to come to see as regularly. Because of this we cannot apply a compression wrap  and the other situation is that she uses a ultrasound machine for his wound stimulation of a her bone as per the orthopedic doctors. Objective Constitutional Pulse regular. Respirations normal and unlabored. Afebrile. Vitals Time Taken: 11:28 AM, Temperature: 98.4 F, Pulse: 72 bpm, Respiratory Rate: 16 breaths/min, Blood Pressure: 104/49 mmHg. Savannah, Key. ([id number]) Eyes Nonicteric. Reactive to light. Ears, Nose, Mouth, and Throat Lips, teeth, and gums WNL.Marland Kitchen Moist mucosa without lesions . Neck supple and nontender. No palpable supraclavicular or cervical adenopathy. Normal sized without goiter. Respiratory WNL. No retractions.. Cardiovascular Pedal Pulses WNL. No clubbing, cyanosis but has +2 edema of the right lower  extremity. Chest Breasts symmetical and no nipple discharge.. Breast tissue WNL, no masses, lumps, or tenderness.. Musculoskeletal Adexa without tenderness or enlargement.. Digits and nails w/o clubbing, cyanosis, infection, petechiae, ischemia, or inflammatory conditions.Marland Kitchen. Psychiatric Judgement and insight Intact.. No evidence of depression, anxiety, or agitation.. Integumentary (Hair, Skin) No suspicious lesions. ulcer on the right lower extremity is completely healed. No crepitus or fluctuance. No peri-wound warmth or erythema. No masses.. Wound #1 status is Healed - Epithelialized. Original cause of wound was Surgical Injury. The wound is located on the Right,Lateral Lower Leg. The wound measures 0cm length x 0cm width x 0cm depth; 0cm^2 area and 0cm^3 volume. There is a none present amount of drainage noted. There is no granulation within the wound bed. There is no necrotic tissue within the wound bed. The periwound skin appearance exhibited: Dry/Scaly. The periwound skin appearance did not exhibit: Callus, Crepitus, Excoriation, Fluctuance, Friable, Induration, Localized Edema, Rash, Scarring, Maceration, Moist, Atrophie Blanche, Cyanosis, Ecchymosis,  Hemosiderin Staining, Mottled, Pallor, Rubor, Erythema. Periwound temperature was noted as No Abnormality. Assessment Active Problems ICD-10 T81.30XA - Disruption of wound, unspecified, initial encounter Savannah Key, Savannah M. ([id number]) Her wound is completely healed and I have discharged her from the wound care services. I recommended elevation of the limbs to help with her edema and also light compression stockings which she has at home. She will come back and see as on a when necessary basis. Plan Edema Control: Support Garment 20-30 mm/Hg pressure to: - if needed for edema control Other: - elevate legs [facility] Her wound is completely healed and I have discharged her from the wound care services. I recommended elevation of the limbs to help with her edema and also light compression stockings which she has at home. She will come back and see as on a when necessary basis. Electronic Signature(s) Signed: 06/26/2014 3:34:49 PM By: Evlyn KannerBritto, Bonney Berres MD, FACS Previous Signature: 06/26/2014 12:36:02 PM Version By: Evlyn KannerBritto, Kelen Laura MD, FACS Entered By: Evlyn KannerBritto, Madonna Flegal on 06/26/2014 15:32:16 Savannah Key, Savannah M. ([id number]) -------------------------------------------------------------------------------- SuperBill Details Patient Name: Savannah Key, Savannah M. Date of Service: 06/26/2014 Medical Record Number: [id number] Patient Account Number: [id number] Date of Birth/Sex: 09/27/1940 ([name]) Treating RN: Primary Care Physician: Hinda LenisEILLY, SHARON Other Clinician: Referring Physician: Hinda LenisEILLY, SHARON Treating Physician/Extender: Rudene ReBritto, Elber Galyean Weeks in Treatment: 29 Diagnosis Coding ICD-10 Codes Code Description T81.30XA Disruption of wound, unspecified, initial encounter Facility Procedures CPT4 Code: [id number] Description: [phone] - WOUND CARE VISIT-LEV 2 EST PT Modifier: Quantity: 1 Physician Procedures CPT4 Code: [id number] Description: 99213 - WC PHYS  LEVEL 3 - EST PT ICD-10 Description Diagnosis T81.30XA Disruption of wound, unspecified, initial encou Modifier: nter Quantity: 1 Electronic Signature(s) Signed: 06/26/2014 12:36:02 PM By: Evlyn KannerBritto, Mareta Chesnut MD, FACS Signed: 06/26/2014 4:43:53 PM By: Renee HarderMabry, Kelsey RN Entered By: Renee HarderMabry, Kelsey on 06/26/2014 11:46:05

## 2014-06-26 NOTE — Progress Notes (Signed)
Savannah Key ([id number]) Visit Report for 06/26/2014 Arrival Information Details Patient Name: Savannah Key, Savannah Key. Date of Service: 06/26/2014 11:00 AM Medical Record Number: [id number] Patient Account Number: [id number] Date of Birth/Sex: [birth year] ([name]) Treating RN: Afful, RN, BSN, [place] Sink Primary Care Physician: Hinda Lenis Other Clinician: Referring Physician: Hinda Lenis Treating Physician/Extender: Rudene Re in Treatment: 29 Visit Information History Since Last Visit Added or deleted any medications: No Patient Arrived: Walker Any new allergies or adverse reactions: No Arrival Time: 11:25 Had a fall or experienced change in No Accompanied By: self activities of daily living that may affect Transfer Assistance: None risk of falls: Patient Identification Verified: Yes Signs or symptoms of abuse/neglect since last No Secondary Verification Process Completed: Yes visito Patient Requires Transmission-Based No Has Dressing in Place as Prescribed: Yes Precautions: Pain Present Now: No Patient Has Alerts: No Electronic Signature(s) Signed: 06/26/2014 3:31:27 PM By: Elpidio Eric BSN, RN Entered By: Elpidio Eric on 06/26/2014 11:27:56 Savannah Key ([id number]) -------------------------------------------------------------------------------- Clinic Level of Care Assessment Details Patient Name: Savannah Key. Date of Service: 06/26/2014 11:00 AM Medical Record Number: [id number] Patient Account Number: [id number] Date of Birth/Sex: 07-Jan-1941 ([name]) Treating RN: Renee Harder Primary Care Physician: Hinda Lenis Other Clinician: Referring Physician: Hinda Lenis Treating Physician/Extender: Rudene Re in Treatment: 29 Clinic Level of Care Assessment Items TOOL 4 Quantity Score  - Use when only an EandM is performed on FOLLOW-UP visit 0 ASSESSMENTS - Nursing Assessment / Reassessment  - Reassessment of Co-morbidities (includes  updates in patient status) 0  - Reassessment of Adherence to Treatment Plan 0 ASSESSMENTS - Wound and Skin Assessment / Reassessment  - Simple Wound Assessment / Reassessment - one wound 0  - Complex Wound Assessment / Reassessment - multiple wounds 0  - Dermatologic / Skin Assessment (not related to wound area) 0 ASSESSMENTS - Focused Assessment X - Circumferential Edema Measurements - multi extremities 1 5  - Nutritional Assessment / Counseling / Intervention 0 X - Lower Extremity Assessment (monofilament, tuning fork, pulses) 1 5  - Peripheral Arterial Disease Assessment (using hand held doppler) 0 ASSESSMENTS - Ostomy and/or Continence Assessment and Care  - Incontinence Assessment and Management 0  - Ostomy Care Assessment and Management (repouching, etc.) 0 PROCESS - Coordination of Care X - Simple Patient / Family Education for ongoing care 1 15  - Complex (extensive) Patient / Family Education for ongoing care 0  - Staff obtains Chiropractor, Records, Test Results / Process Orders 0  - Staff telephones HHA, Nursing Homes / Clarify orders / etc 0  - Routine Transfer to another Facility (non-emergent condition) 0 Savannah Key, Savannah Key ([id number])  - Routine Hospital Admission (non-emergent condition) 0  - New Admissions / Manufacturing engineer / Ordering NPWT, Apligraf, etc. 0  - Emergency Hospital Admission (emergent condition) 0 X - Simple Discharge Coordination 1 10  - Complex (extensive) Discharge Coordination 0 PROCESS - Special Needs  - Pediatric / Minor Patient Management 0  - Isolation Patient Management 0  - Hearing / Language / Visual special needs 0  - Assessment of Community assistance (transportation, D/C planning, etc.) 0  - Additional assistance / Altered mentation 0  - Support Surface(s) Assessment (bed, cushion, seat, etc.) 0 INTERVENTIONS - Wound Cleansing / Measurement  - Simple Wound Cleansing - one wound 0  -  Complex Wound Cleansing - multiple wounds 0 X - Wound Imaging (photographs - any number of wounds) 1 5  - Wound Tracing (instead of  photographs) 0 []  - Simple Wound Measurement - one wound 0 []  - Complex Wound Measurement - multiple wounds 0 INTERVENTIONS - Wound Dressings []  - Small Wound Dressing one or multiple wounds 0 []  - Medium Wound Dressing one or multiple wounds 0 []  - Large Wound Dressing one or multiple wounds 0 []  - Application of Medications - topical 0 []  - Application of Medications - injection 0 INTERVENTIONS - Miscellaneous []  - External ear exam 0 Savannah Key, Savannah Key. ([id number]) []  - Specimen Collection (cultures, biopsies, blood, body fluids, etc.) 0 []  - Specimen(s) / Culture(s) sent or taken to Lab for analysis 0 []  - Patient Transfer (multiple staff / Michiel Sites Lift / Similar devices) 0 []  - Simple Staple / Suture removal (25 or less) 0 []  - Complex Staple / Suture removal (26 or more) 0 []  - Hypo / Hyperglycemic Management (close monitor of Blood Glucose) 0 []  - Ankle / Brachial Index (ABI) - do not check if billed separately 0 X - Vital Signs 1 5 Has the patient been seen at the hospital within the last three years: Yes Total Score: 45 Level Of Care: New/Established - Level 2 Electronic Signature(s) Signed: 06/26/2014 4:43:53 PM By: Renee Harder RN Entered By: Renee Harder on 06/26/2014 11:40:18 Savannah Key ([id number]) -------------------------------------------------------------------------------- Encounter Discharge Information Details Patient Name: Savannah Key. Date of Service: 06/26/2014 11:00 AM Medical Record Number: [id number] Patient Account Number: [id number] Date of Birth/Sex: 1940-06-16 ([name]) Treating RN: Primary Care Physician: Hinda Lenis Other Clinician: Referring Physician: Hinda Lenis Treating Physician/Extender: Rudene Re in Treatment: 29 Encounter Discharge Information Items Schedule Follow-up Appointment:  No Medication Reconciliation completed No and provided to Patient/Care Imelda Dandridge: Provided on Clinical Summary of Care: 06/26/2014 Form Type Recipient Paper Patient LS Electronic Signature(s) Signed: 06/26/2014 11:40:43 AM By: Gwenlyn Perking Entered By: Gwenlyn Perking on 06/26/2014 11:40:43 Crutcher, Doralee Key ([id number]) -------------------------------------------------------------------------------- Lower Extremity Assessment Details Patient Name: EMYLEE, DECELLE. Date of Service: 06/26/2014 11:00 AM Medical Record Number: [id number] Patient Account Number: [id number] Date of Birth/Sex: 02-28-1940 ([name]) Treating RN: Afful, RN, BSN, [place] Sink Primary Care Physician: Hinda Lenis Other Clinician: Referring Physician: Hinda Lenis Treating Physician/Extender: Rudene Re in Treatment: 29 Edema Assessment Assessed: [Left: No] [Right: No] Edema: [Left: N] [Right: o] Calf Left: Right: Point of Measurement: 28 cm From Medial Instep cm cm Ankle Left: Right: Point of Measurement: 10 cm From Medial Instep cm cm Vascular Assessment Claudication: Claudication Assessment [Right:None] Pulses: Posterior Tibial Dorsalis Pedis Palpable: [Right:Yes] Extremity colors, hair growth, and conditions: Extremity Color: [Right:Normal] Hair Growth on Extremity: [Right:Yes] Temperature of Extremity: [Right:Warm] Capillary Refill: [Right:< 3 seconds] Dependent Rubor: [Right:No] Blanched when Elevated: [Right:No] Lipodermatosclerosis: [Right:No] Toe Nail Assessment Left: Right: Thick: Yes Discolored: Yes Deformed: No Improper Length and Hygiene: No Savannah Key, Savannah Key ([id number]) Electronic Signature(s) Signed: 06/26/2014 3:31:27 PM By: Elpidio Eric BSN, RN Entered By: Elpidio Eric on 06/26/2014 11:29:03 Savannah Key ([id number]) -------------------------------------------------------------------------------- Multi Wound Chart Details Patient Name: Savannah Key, Savannah Key. Date of Service:  06/26/2014 11:00 AM Medical Record Number: [id number] Patient Account Number: [id number] Date of Birth/Sex: 12/23/1940 ([name]) Treating RN: Renee Harder Primary Care Physician: Hinda Lenis Other Clinician: Referring Physician: Hinda Lenis Treating Physician/Extender: Rudene Re in Treatment: 29 Vital Signs Height(in): Pulse(bpm): 72 Weight(lbs): Blood Pressure 104/49 (mmHg): Body Mass Index(BMI): Temperature(F): 98.4 Respiratory Rate 16 (breaths/min): Photos: [1:No Photos] [N/A:N/A] Wound Location: [1:Right Lower Leg - Lateral] [N/A:N/A] Wounding Event: [1:Surgical Injury] [N/A:N/A] Primary Etiology: [1:Diabetic Wound/Ulcer of  the Lower Extremity] [N/A:N/A] Comorbid History: [1:Cataracts, Anemia, Hypertension, Myocardial Infarction, Cirrhosis , Hepatitis C, Type II Diabetes, End Stage Renal Disease, Gout, Osteoarthritis] [N/A:N/A] Date Acquired: [1:10/19/2013] [N/A:N/A] Weeks of Treatment: [1:29] [N/A:N/A] Wound Status: [1:Healed - Epithelialized] [N/A:N/A] Measurements L x W x D 0x0x0 [N/A:N/A] (cm) Area (cm) : [1:0] [N/A:N/A] Volume (cm) : [1:0] [N/A:N/A] % Reduction in Area: [1:100.00%] [N/A:N/A] % Reduction in Volume: 100.00% [N/A:N/A] Classification: [1:Grade 1] [N/A:N/A] Exudate Amount: [1:None Present] [N/A:N/A] Granulation Amount: [1:None Present (0%)] [N/A:N/A] Necrotic Amount: [1:None Present (0%)] [N/A:N/A] Periwound Skin Texture: Edema: No [1:Excoriation: No Induration: No Callus: No Crepitus: No] [N/A:N/A] Fluctuance: No Friable: No Rash: No Scarring: No Periwound Skin Dry/Scaly: Yes N/A N/A Moisture: Maceration: No Moist: No Periwound Skin Color: Atrophie Blanche: No N/A N/A Cyanosis: No Ecchymosis: No Erythema: No Hemosiderin Staining: No Mottled: No Pallor: No Rubor: No Temperature: No Abnormality N/A N/A Tenderness on No N/A N/A Palpation: Wound Preparation: Ulcer Cleansing: N/A N/A Rinsed/Irrigated  with Saline Topical Anesthetic Applied: None Treatment Notes Electronic Signature(s) Signed: 06/26/2014 4:43:53 PM By: Renee HarderMabry, Kelsey RN Entered By: Renee HarderMabry, Kelsey on 06/26/2014 11:37:29 Grindle, Doralee AlbinoLINDA M. ([id number]) -------------------------------------------------------------------------------- Multi-Disciplinary Care Plan Details Patient Name: Savannah Key, Savannah M. Date of Service: 06/26/2014 11:00 AM Medical Record Number: [id number] Patient Account Number: [id number] Date of Birth/Sex: 01/14/1941 ([name]) Treating RN: Renee HarderMabry, Kelsey Primary Care Physician: Hinda LenisEILLY, SHARON Other Clinician: Referring Physician: Hinda LenisEILLY, SHARON Treating Physician/Extender: Rudene ReBritto, Errol Weeks in Treatment: 7229 Active Inactive Electronic Signature(s) Signed: 06/26/2014 4:43:53 PM By: Renee HarderMabry, Kelsey RN Entered By: Renee HarderMabry, Kelsey on 06/26/2014 11:37:19 Guagliardo, Doralee AlbinoLINDA M. ([id number]) -------------------------------------------------------------------------------- Pain Assessment Details Patient Name: Savannah Key, Savannah M. Date of Service: 06/26/2014 11:00 AM Medical Record Number: [id number] Patient Account Number: [id number] Date of Birth/Sex: 05/23/1940 ([name]) Treating RN: Clover MealyAfful, RN, BSN, [place] Sinkita Primary Care Physician: Hinda LenisEILLY, SHARON Other Clinician: Referring Physician: Hinda LenisEILLY, SHARON Treating Physician/Extender: Rudene ReBritto, Errol Weeks in Treatment: 29 Active Problems Location of Pain Severity and Description of Pain Patient Has Paino No Site Locations Pain Management and Medication Current Pain Management: Electronic Signature(s) Signed: 06/26/2014 3:31:27 PM By: Elpidio EricAfful, Rita BSN, RN Entered By: Elpidio EricAfful, Rita on 06/26/2014 11:28:03 Savannah Key, Savannah M. ([id number]) -------------------------------------------------------------------------------- Wound Assessment Details Patient Name: Savannah Key, Savannah M. Date of Service: 06/26/2014 11:00 AM Medical Record Number: [id number] Patient Account Number: [id number] Date of  Birth/Sex: 12/30/1940 ([name]) Treating RN: Afful, RN, BSN, [place] Sinkita Primary Care Physician: Hinda LenisEILLY, SHARON Other Clinician: Referring Physician: Hinda LenisEILLY, SHARON Treating Physician/Extender: Rudene ReBritto, Errol Weeks in Treatment: 29 Wound Status Wound Number: 1 Primary Diabetic Wound/Ulcer of the Lower Etiology: Extremity Wound Location: Right Lower Leg - Lateral Wound Healed - Epithelialized Wounding Event: Surgical Injury Status: Date Acquired: 10/19/2013 Comorbid Cataracts, Anemia, Hypertension, Weeks Of Treatment: 29 History: Myocardial Infarction, Cirrhosis , Clustered Wound: No Hepatitis C, Type II Diabetes, End Stage Renal Disease, Gout, Osteoarthritis Photos Photo Uploaded By: Elpidio EricAfful, Rita on 06/26/2014 15:29:49 Wound Measurements Length: (cm) 0 % Reduction Width: (cm) 0 % Reduction Depth: (cm) 0 Area: (cm) 0 Volume: (cm) 0 in Area: 100% in Volume: 100% Wound Description Classification: Grade 1 Exudate Amount: None Present Wound Bed Granulation Amount: None Present (0%) Necrotic Amount: None Present (0%) Periwound Skin Texture Texture Color No Abnormalities Noted: No No Abnormalities Noted: No Savannah Key, Ajani M. ([id number]) Callus: No Atrophie Blanche: No Crepitus: No Cyanosis: No Excoriation: No Ecchymosis: No Fluctuance: No Erythema: No Friable: No Hemosiderin Staining: No Induration: No Mottled: No Localized Edema: No Pallor: No Rash: No Rubor: No Scarring: No Temperature /  Pain Moisture Temperature: No Abnormality No Abnormalities Noted: No Dry / Scaly: Yes Maceration: No Moist: No Wound Preparation Ulcer Cleansing: Rinsed/Irrigated with Saline Topical Anesthetic Applied: None Electronic Signature(s) Signed: 06/26/2014 3:31:27 PM By: Elpidio Eric BSN, RN Entered By: Elpidio Eric on 06/26/2014 11:29:54 Savannah Key ([id number]) -------------------------------------------------------------------------------- Vitals Details Patient Name:  ALIANYS, CHACKO. Date of Service: 06/26/2014 11:00 AM Medical Record Number: [id number] Patient Account Number: [id number] Date of Birth/Sex: 10/11/1940 ([name]) Treating RN: Afful, RN, BSN, [place] Sink Primary Care Physician: Hinda Lenis Other Clinician: Referring Physician: Hinda Lenis Treating Physician/Extender: Rudene Re in Treatment: 29 Vital Signs Time Taken: 11:28 Temperature (F): 98.4 Pulse (bpm): 72 Respiratory Rate (breaths/min): 16 Blood Pressure (mmHg): 104/49 Reference Range: 80 - 120 mg / dl Electronic Signature(s) Signed: 06/26/2014 3:31:27 PM By: Elpidio Eric BSN, RN Entered By: Elpidio Eric on 06/26/2014 11:28:33

## 2014-07-15 ENCOUNTER — Emergency Department
Admission: EM | Admit: 2014-07-15 | Discharge: 2014-07-15 | Disposition: A | Discharge status: Home / Self Care | Payer: Medicare (Managed Care) | Attending: Emergency Medicine | Admitting: Emergency Medicine

## 2014-07-15 ENCOUNTER — Encounter: Payer: Self-pay | Admitting: Emergency Medicine

## 2014-07-15 ENCOUNTER — Emergency Department: Payer: Medicare (Managed Care)

## 2014-07-15 DIAGNOSIS — S52502A Unspecified fracture of the lower end of left radius, initial encounter for closed fracture: Secondary | ICD-10-CM | POA: Insufficient documentation

## 2014-07-15 DIAGNOSIS — S52612A Displaced fracture of left ulna styloid process, initial encounter for closed fracture: Secondary | ICD-10-CM | POA: Insufficient documentation

## 2014-07-15 DIAGNOSIS — Z794 Long term (current) use of insulin: Secondary | ICD-10-CM | POA: Diagnosis not present

## 2014-07-15 DIAGNOSIS — Y9289 Other specified places as the place of occurrence of the external cause: Secondary | ICD-10-CM | POA: Diagnosis not present

## 2014-07-15 DIAGNOSIS — W01198A Fall on same level from slipping, tripping and stumbling with subsequent striking against other object, initial encounter: Secondary | ICD-10-CM | POA: Insufficient documentation

## 2014-07-15 DIAGNOSIS — Z792 Long term (current) use of antibiotics: Secondary | ICD-10-CM | POA: Insufficient documentation

## 2014-07-15 DIAGNOSIS — N186 End stage renal disease: Secondary | ICD-10-CM | POA: Diagnosis not present

## 2014-07-15 DIAGNOSIS — Y998 Other external cause status: Secondary | ICD-10-CM | POA: Insufficient documentation

## 2014-07-15 DIAGNOSIS — Z992 Dependence on renal dialysis: Secondary | ICD-10-CM | POA: Insufficient documentation

## 2014-07-15 DIAGNOSIS — E119 Type 2 diabetes mellitus without complications: Secondary | ICD-10-CM | POA: Diagnosis not present

## 2014-07-15 DIAGNOSIS — Y9301 Activity, walking, marching and hiking: Secondary | ICD-10-CM | POA: Diagnosis not present

## 2014-07-15 DIAGNOSIS — I120 Hypertensive chronic kidney disease with stage 5 chronic kidney disease or end stage renal disease: Secondary | ICD-10-CM | POA: Insufficient documentation

## 2014-07-15 DIAGNOSIS — S6992XA Unspecified injury of left wrist, hand and finger(s), initial encounter: Secondary | ICD-10-CM | POA: Diagnosis present

## 2014-07-15 DIAGNOSIS — Z7982 Long term (current) use of aspirin: Secondary | ICD-10-CM | POA: Insufficient documentation

## 2014-07-15 HISTORY — DX: Acute myocardial infarction, unspecified: I21.9

## 2014-07-15 HISTORY — DX: Unspecified kidney failure: N19

## 2014-07-15 HISTORY — DX: Hepatic failure, unspecified without coma: K72.90

## 2014-07-15 HISTORY — DX: Essential (primary) hypertension: I10

## 2014-07-15 HISTORY — DX: Unspecified fracture of unspecified lower leg, initial encounter for closed fracture: S82.90XA

## 2014-07-15 MED ORDER — OXYCODONE HCL 5 MG PO TABS
ORAL_TABLET | ORAL | Status: AC
  Administered 2014-07-15: 5 mg via ORAL
  Filled 2014-07-15: qty 1

## 2014-07-15 MED ORDER — OXYCODONE-ACETAMINOPHEN 5-325 MG PO TABS: 1.0000 | ORAL_TABLET | Freq: Three times a day (TID) | ORAL | Status: DC | PRN

## 2014-07-15 MED ORDER — OXYCODONE HCL 5 MG PO TABS
5.0000 mg | ORAL_TABLET | Freq: Once | ORAL | Status: AC
Start: 2014-07-15 — End: 2014-07-15
  Administered 2014-07-15: 5 mg via ORAL

## 2014-07-15 NOTE — Discharge Instructions (Signed)
Take medication as prescribed. Apply ice and keep elevated.   As discussed follow up closely with orthopedic. Call tomorrow to schedule orthopedic follow up in 1-2 days.   Return to ER for new or worsening concerns.   Wrist Fracture A wrist fracture is a break or crack in one of the bones of your wrist. Your wrist is made up of eight small bones at the palm of your hand (carpal bones) and two long bones that make up your forearm (radius and ulna).  CAUSES   A direct blow to the wrist.  Falling on an outstretched hand.  Trauma, such as a car accident or a fall. RISK FACTORS Risk factors for wrist fracture include:   Participating in contact and high-risk sports, such as skiing, biking, and ice skating.  Taking steroid medicines.  Smoking.  Being female.  Being Caucasian.  Drinking more than three alcoholic beverages per day.  Having low or lowered bone density (osteoporosis or osteopenia).  Age. Older adults have decreased bone density.  Women who have had menopause.  History of previous fractures. SIGNS AND SYMPTOMS Symptoms of wrist fractures include tenderness, bruising, and inflammation. Additionally, the wrist may hang in an odd position or appear deformed.  DIAGNOSIS Diagnosis may include:  Physical exam.  X-ray. TREATMENT Treatment depends on many factors, including the nature and location of the fracture, your age, and your activity level. Treatment for wrist fracture can be nonsurgical or surgical.  Nonsurgical Treatment A plaster cast or splint may be applied to your wrist if the bone is in a good position. If the fracture is not in good position, it may be necessary for your health care provider to realign it before applying a splint or cast. Usually, a cast or splint will be worn for several weeks.  Surgical Treatment Sometimes the position of the bone is so far out of place that surgery is required to apply a device to hold it together as it heals.  Depending on the fracture, there are a number of options for holding the bone in place while it heals, such as a cast and metal pins.  HOME CARE INSTRUCTIONS  Keep your injured wrist elevated and move your fingers as much as possible.  Do not put pressure on any part of your cast or splint. It may break.   Use a plastic bag to protect your cast or splint from water while bathing or showering. Do not lower your cast or splint into water.  Take medicines only as directed by your health care provider.  Keep your cast or splint clean and dry. If it becomes wet, damaged, or suddenly feels too tight, contact your health care provider right away.  Do not use any tobacco products including cigarettes, chewing tobacco, or electronic cigarettes. Tobacco can delay bone healing. If you need help quitting, ask your health care provider.  Keep all follow-up visits as directed by your health care provider. This is important.  Ask your health care provider if you should take supplements of calcium and vitamins C and D to promote bone healing. SEEK MEDICAL CARE IF:   Your cast or splint is damaged, breaks, or gets wet.  You have a fever.  You have chills.  You have continued severe pain or more swelling than you did before the cast was put on. SEEK IMMEDIATE MEDICAL CARE IF:   Your hand or fingernails on the injured arm turn blue or gray, or feel cold or numb.  You have decreased  feeling in the fingers of your injured arm. MAKE SURE YOU:  Understand these instructions.  Will watch your condition.  Will get help right away if you are not doing well or get worse. Document Released: 10/15/2004 Document Revised: 05/22/2013 Document Reviewed: 01/23/2011 [facility] Patient Information 2015 [place], [place]. This information is not intended to replace advice given to you by your health care provider. Make sure you discuss any questions you have with your health care provider.

## 2014-07-15 NOTE — ED Notes (Signed)
Pt states she was getting clothes out of the closet, thought she was going to fell and hit her arm on door jam. NAD noted at time of assessment. Pt alert and oriented. Denies falling, denies head injury, denies LOC.

## 2014-07-15 NOTE — ED Provider Notes (Signed)
[facility] Emergency Department Provider Note  ____________________________________________  Time seen: Approximately 10:18 AM  I have reviewed the triage vital signs and the nursing notes.   HISTORY  Chief Complaint Wrist Pain   HPI Savannah Key is a 74 y.o. female presents the ER via EMS for complaint of left wrist pain. Patient states she was walking down her hallway this morning and felt that her left ankle was going to roll and states that she turned quickly to stabilize herself at the door frame and states that she hit her left wrist on the door frame. Patient states that she was able to catch her balance and did not fall. Denies head injury or loss of consciousness. Reports left wrist pain at 9 out of 10 aching and throbbing. Denies pain radiation. Denies numbness or tingling sensation. Reports only pain is present to left wrist. REports normally ambulates with walker but did not have it with her in her hallway.   Past Medical History  Diagnosis Date  . Chronic kidney disease   . GERD (gastroesophageal reflux disease)   . Carotid artery stenosis   . Coronary artery disease   . Hypothyroidism   . Diabetes mellitus without complication   . Depression   . Arthritis   . Kidney failure   . Liver failure   . MI (myocardial infarction)   . HTN (hypertension)   . Broken leg     R  ambulates with walker  There are no active problems to display for this patient.   Past Surgical History  Procedure Laterality Date  . Av fistula placement Left   . Cholecystectomy    . Peripheral vascular catheterization N/A 06/11/2014    Procedure: A/V Shuntogram/Fistulagram;  Surgeon: Annice Needy, MD;  Location: [facility] INVASIVE CV LAB;  Service: Cardiovascular;  Laterality: N/A;  . Peripheral vascular catheterization Left 06/11/2014    Procedure: A/V Shunt Intervention;  Surgeon: Annice Needy, MD;  Location: [facility] INVASIVE CV LAB;  Service: Cardiovascular;  Laterality:  Left;   Dialysis Monday Wednesday Friday Current Outpatient Rx  Name  Route  Sig  Dispense  Refill  . aspirin EC 81 MG tablet   Oral   Take by mouth daily.         Marland Kitchen atorvastatin (LIPITOR) 20 MG tablet   Oral   Take 20 mg by mouth daily.         . bisacodyl (DULCOLAX) 5 MG EC tablet   Oral   Take 5 mg by mouth daily as needed for moderate constipation.         . cephALEXin (KEFLEX) 250 MG capsule   Oral   Take by mouth 4 (four) times daily.         . cetirizine (ZYRTEC) 5 MG tablet   Oral   Take 5 mg by mouth daily.         . citalopram (CELEXA) 10 MG tablet   Oral   Take 10 mg by mouth daily.         . ergocalciferol (VITAMIN D2) 50000 UNITS capsule   Oral   Take 50,000 Units by mouth once a week.         . famotidine (PEPCID) 20 MG tablet   Oral   Take 20 mg by mouth 2 (two) times daily.         . ferrous fumarate (HEMOCYTE - 106 MG FE) 325 (106 FE) MG TABS tablet   Oral   Take  1 tablet by mouth.         . hydrOXYzine (ATARAX/VISTARIL) 10 MG tablet   Oral   Take 10 mg by mouth 3 (three) times daily as needed.         . insulin glargine (LANTUS) 100 UNIT/ML injection   Subcutaneous   Inject 85 Units into the skin every morning.          . insulin glargine (LANTUS) 100 UNIT/ML injection   Subcutaneous   Inject 50 Units into the skin daily before supper.         . insulin NPH Human (HUMULIN N,NOVOLIN N) 100 UNIT/ML injection   Subcutaneous   Inject 10 Units into the skin 3 (three) times daily before meals.          Marland Kitchen levothyroxine (SYNTHROID, LEVOTHROID) 100 MCG tablet   Oral   Take 100 mcg by mouth daily before breakfast.         . nadolol (CORGARD) 40 MG tablet   Oral   Take 40 mg by mouth daily.         Marland Kitchen omeprazole (PRILOSEC) 20 MG capsule   Oral   Take 20 mg by mouth daily.         . pantoprazole (PROTONIX) 40 MG tablet   Oral   Take 40 mg by mouth 2 (two) times daily.         . pregabalin (LYRICA) 50 MG  capsule   Oral   Take 50 mg by mouth 3 (three) times daily.         Marland Kitchen rOPINIRole (REQUIP) 2 MG tablet   Oral   Take 2 mg by mouth at bedtime.         . traZODone (DESYREL) 50 MG tablet   Oral   Take 50 mg by mouth at bedtime.         Marland Kitchen venlafaxine (EFFEXOR) 37.5 MG tablet   Oral   Take 37.5 mg by mouth 2 (two) times daily.           Allergies Sulfa antibiotics  No family history on file.  Social History History  Substance Use Topics  . Smoking status: Never Smoker   . Smokeless tobacco: Not on file  . Alcohol Use: No    Review of Systems Constitutional: No fever/chills Eyes: No visual changes. ENT: No sore throat. Cardiovascular: Denies chest pain. Respiratory: Denies shortness of breath. Gastrointestinal: No abdominal pain.  No nausea, no vomiting.  No diarrhea.  No constipation. Genitourinary: Negative for dysuria. Musculoskeletal: Negative for neck or back pain.positive for left wrist pain Skin: Negative for rash. Neurological: Negative for headaches, focal weakness or numbness.  10-point ROS otherwise negative.  ____________________________________________   PHYSICAL EXAM:  VITAL SIGNS: ED Triage Vitals  Enc Vitals Group     BP 07/15/14 1014 123/64 mmHg     Pulse Rate 07/15/14 1014 81     Resp 07/15/14 1014 20     Temp 07/15/14 1014 98.3 F (36.8 C)     Temp Source 07/15/14 1014 Oral     SpO2 07/15/14 1014 99 %     Weight 07/15/14 1014 180 lb (81.647 kg)     Height 07/15/14 1014 5' (1.524 m)     Head Cir --      Peak Flow --      Pain Score 07/15/14 1015 9     Pain Loc --      Pain Edu? --      Excl.  in GC? --     Constitutional: Alert and oriented. Well appearing and in no acute distress. Eyes: Conjunctivae are normal. PERRL. EOMI. Head: Atraumatic. Nose: No congestion/rhinnorhea. Mouth/Throat: Mucous membranes are moist.  Oropharynx non-erythematous. Neck: No stridor.  No cervical spine tenderness to  palpation. Hematological/Lymphatic/Immunilogical: No cervical lymphadenopathy. Cardiovascular: Normal rate, regular rhythm. Grossly normal heart sounds.  Good peripheral circulation. Respiratory: Normal respiratory effort.  No retractions. Lungs CTAB. Gastrointestinal: Soft and nontender. Obese abdomen. No CVA tenderness. Musculoskeletal: No lower extremity tenderness nor edema, with full ROM. Left wrist with mod ecchymosis and swelling. Pain to medial and lateral wrist.Pain with rotation. Bilateral hand grips equal. Distal radial pulses equal bilaterally and easily found. Left arm nontender above wrist. Lower extremities and right upper extremities nontender. NO cervical, thoracic or lumbar TTP.  AV fistula to left upper arm, positive thrill and bruit. Neurologic:  Normal speech and language. No gross focal neurologic deficits are appreciated. Speech is normal. No gait instability. Skin:  Skin is warm, dry and intact. No rash noted. Psychiatric: Mood and affect are normal. Speech and behavior are normal.  ________________________________________  RADIOLOGY  LEFT WRIST - COMPLETE 3+ VIEW  COMPARISON: None.  FINDINGS: Mildly comminuted distal radial fractures present with transverse metaphyseal component and a sagittally oriented longitudinal component extending from the transverse fracture to the distal radial articular surface.  Shard like calcification adjacent to the ulnar styloid is present and probably represents an avulsion, although donor site is not well seen.  There is radial spurring along the head of the second metacarpal. Bony demineralization. Vascular calcifications.  IMPRESSION: 1. Mildly comminuted distal radial fracture, with transverse metaphyseal component and with a longitudinal component extending to the distal radial articular surface. 2. Probable tiny avulsion of the ulnar styloid.   Electronically Signed By: Gaylyn Rong M.D. On: 07/15/2014  10:46       ____________________________________________   PROCEDURES  Procedure(s) performed: SPLINT APPLICATION Date/Time: 11:55 AM Authorized by: Renford Dills Consent: Verbal consent obtained. Risks and benefits: risks, benefits and alternatives were discussed Consent given by: patient Splint applied by: ed technician Location details: left wrist Splint type: volar dorsal ocl Supplies used: ocl and sling  Post-procedure: The splinted body part was neurovascularly unchanged following the procedure. Patient tolerance: Patient tolerated the procedure well with no immediate complications.    _________________________________________   INITIAL IMPRESSION / ASSESSMENT AND PLAN / ED COURSE  Pertinent labs & imaging results that were available during my care of the patient were reviewed by me and considered in my medical decision making (see chart for details).  Presents to ER for complaints of left wrist pain post accidentally hitting wrist on doorway this am. Denies fall or other injury. No pain above left wrist.  1100: Discussed patient and x-ray with Dr. Joice Lofts orthopedic. Dr. Joice Lofts recommends patient be placed in volar dorsal OCL splint and have close follow-up in ER and fracture distal to dialysis access. Patient to follow-up in 1-2 days with orthopedics in office. Patient verbalized understanding and agreed to plan. Patient to call tomorrow to schedule appointment. Discussed with pt need to elevate to assist with swelling, pt verbalized understanding. ____________________________________________   FINAL CLINICAL IMPRESSION(S) / ED DIAGNOSES  Final diagnoses:  Distal radial fracture, left, closed, initial encounter  Fracture of ulnar styloid, left, closed, initial encounter      Renford Dills, NP 07/15/14 1156  Governor Rooks, MD 07/15/14 [phone]

## 2014-08-07 ENCOUNTER — Encounter: Payer: Self-pay | Admitting: Certified Registered"

## 2014-08-07 ENCOUNTER — Ambulatory Visit
Admission: RE | Admit: 2014-08-07 | Discharge: 2014-08-07 | Disposition: A | Discharge status: Home / Self Care | Payer: Medicare (Managed Care) | Source: Ambulatory Visit | Attending: Orthopedic Surgery | Admitting: Orthopedic Surgery

## 2014-08-07 ENCOUNTER — Encounter: Payer: Self-pay | Admitting: *Deleted

## 2014-08-07 ENCOUNTER — Encounter
Admission: RE | Disposition: A | Discharge status: Home / Self Care | Payer: Self-pay | Source: Ambulatory Visit | Attending: Orthopedic Surgery

## 2014-08-07 ENCOUNTER — Inpatient Hospital Stay: Admission: RE | Admit: 2014-08-07 | Payer: Medicare (Managed Care) | Source: Ambulatory Visit

## 2014-08-07 DIAGNOSIS — Z882 Allergy status to sulfonamides status: Secondary | ICD-10-CM | POA: Insufficient documentation

## 2014-08-07 DIAGNOSIS — Z79899 Other long term (current) drug therapy: Secondary | ICD-10-CM | POA: Diagnosis not present

## 2014-08-07 DIAGNOSIS — M109 Gout, unspecified: Secondary | ICD-10-CM | POA: Insufficient documentation

## 2014-08-07 DIAGNOSIS — X58XXXA Exposure to other specified factors, initial encounter: Secondary | ICD-10-CM | POA: Diagnosis not present

## 2014-08-07 DIAGNOSIS — K769 Liver disease, unspecified: Secondary | ICD-10-CM | POA: Diagnosis not present

## 2014-08-07 DIAGNOSIS — Z8719 Personal history of other diseases of the digestive system: Secondary | ICD-10-CM | POA: Insufficient documentation

## 2014-08-07 DIAGNOSIS — E119 Type 2 diabetes mellitus without complications: Secondary | ICD-10-CM | POA: Diagnosis not present

## 2014-08-07 DIAGNOSIS — E785 Hyperlipidemia, unspecified: Secondary | ICD-10-CM | POA: Insufficient documentation

## 2014-08-07 DIAGNOSIS — I2510 Atherosclerotic heart disease of native coronary artery without angina pectoris: Secondary | ICD-10-CM | POA: Insufficient documentation

## 2014-08-07 DIAGNOSIS — F329 Major depressive disorder, single episode, unspecified: Secondary | ICD-10-CM | POA: Diagnosis not present

## 2014-08-07 DIAGNOSIS — Z9049 Acquired absence of other specified parts of digestive tract: Secondary | ICD-10-CM | POA: Diagnosis not present

## 2014-08-07 DIAGNOSIS — Z809 Family history of malignant neoplasm, unspecified: Secondary | ICD-10-CM | POA: Diagnosis not present

## 2014-08-07 DIAGNOSIS — Z5309 Procedure and treatment not carried out because of other contraindication: Secondary | ICD-10-CM | POA: Insufficient documentation

## 2014-08-07 DIAGNOSIS — Z7982 Long term (current) use of aspirin: Secondary | ICD-10-CM | POA: Diagnosis not present

## 2014-08-07 DIAGNOSIS — S52502A Unspecified fracture of the lower end of left radius, initial encounter for closed fracture: Secondary | ICD-10-CM | POA: Diagnosis present

## 2014-08-07 DIAGNOSIS — Z833 Family history of diabetes mellitus: Secondary | ICD-10-CM | POA: Insufficient documentation

## 2014-08-07 DIAGNOSIS — I252 Old myocardial infarction: Secondary | ICD-10-CM | POA: Diagnosis not present

## 2014-08-07 DIAGNOSIS — Z955 Presence of coronary angioplasty implant and graft: Secondary | ICD-10-CM | POA: Diagnosis not present

## 2014-08-07 DIAGNOSIS — I10 Essential (primary) hypertension: Secondary | ICD-10-CM | POA: Insufficient documentation

## 2014-08-07 DIAGNOSIS — Z794 Long term (current) use of insulin: Secondary | ICD-10-CM | POA: Insufficient documentation

## 2014-08-07 DIAGNOSIS — Z87442 Personal history of urinary calculi: Secondary | ICD-10-CM | POA: Diagnosis not present

## 2014-08-07 DIAGNOSIS — Z8249 Family history of ischemic heart disease and other diseases of the circulatory system: Secondary | ICD-10-CM | POA: Insufficient documentation

## 2014-08-07 LAB — GLUCOSE, CAPILLARY
GLUCOSE-CAPILLARY: 157 mg/dL — AB (ref 65–99)
Glucose-Capillary: 62 mg/dL — ABNORMAL LOW (ref 65–99)
Glucose-Capillary: 67 mg/dL (ref 65–99)
Glucose-Capillary: 141 mg/dL — ABNORMAL HIGH (ref 65–99)

## 2014-08-07 LAB — POTASSIUM: POTASSIUM, SERUM: 4

## 2014-08-07 SURGERY — OPEN REDUCTION INTERNAL FIXATION (ORIF) DISTAL RADIUS FRACTURE
Anesthesia: Choice | Laterality: Left

## 2014-08-07 MED ORDER — FAMOTIDINE 20 MG PO TABS: 20.0000 mg | ORAL_TABLET | Freq: Once | ORAL | Status: DC

## 2014-08-07 MED ORDER — DEXTROSE 50 % IV SOLN
25.0000 mL | Freq: Once | INTRAVENOUS | Status: AC
  Administered 2014-08-07: 25 mL via INTRAVENOUS

## 2014-08-07 MED ORDER — SODIUM CHLORIDE 0.9 % IV SOLN
INTRAVENOUS | Status: DC
  Administered 2014-08-07: 15:00:00 via INTRAVENOUS

## 2014-08-07 MED ORDER — NEOMYCIN-POLYMYXIN B GU 40-200000 IR SOLN
Status: AC
  Filled 2014-08-07: qty 2

## 2014-08-07 MED ORDER — DEXTROSE 50 % IV SOLN
INTRAVENOUS | Status: AC
  Administered 2014-08-07: 25 mL via INTRAVENOUS
  Filled 2014-08-07: qty 50

## 2014-08-07 MED ORDER — CEFAZOLIN SODIUM 1-5 GM-% IV SOLN: 1.0000 g | Freq: Once | INTRAVENOUS | Status: DC

## 2014-08-07 MED ORDER — CEFAZOLIN SODIUM 1-5 GM-% IV SOLN
INTRAVENOUS | Status: AC
  Filled 2014-08-07: qty 50

## 2014-08-07 SURGICAL SUPPLY — 28 items
BANDAGE ELASTIC 4 CLIP ST LF (GAUZE/BANDAGES/DRESSINGS) IMPLANT
CANISTER SUCT 1200ML W/VALVE (MISCELLANEOUS) IMPLANT
CHLORAPREP W/TINT 26ML (MISCELLANEOUS) IMPLANT
DRAPE FLUOR MINI C-ARM 54X84 (DRAPES) IMPLANT
GAUZE PETRO XEROFOAM 1X8 (MISCELLANEOUS) IMPLANT
GAUZE SPONGE 4X4 12PLY STRL (GAUZE/BANDAGES/DRESSINGS) IMPLANT
GLOVE BIOGEL PI IND STRL 9 (GLOVE) IMPLANT
GLOVE BIOGEL PI INDICATOR 9 (GLOVE)
GLOVE SURG ORTHO 9.0 STRL STRW (GLOVE) IMPLANT
GOWN SPECIALTY ULTRA XL (MISCELLANEOUS) IMPLANT
GOWN STRL REUS W/ TWL LRG LVL3 (GOWN DISPOSABLE) IMPLANT
GOWN STRL REUS W/TWL 2XL LVL3 (GOWN DISPOSABLE) IMPLANT
GOWN STRL REUS W/TWL LRG LVL3 (GOWN DISPOSABLE)
KIT RM TURNOVER STRD PROC AR (KITS) IMPLANT
NEEDLE FILTER BLUNT 18X 1/2SAF (NEEDLE)
NEEDLE FILTER BLUNT 18X1 1/2 (NEEDLE) IMPLANT
NS IRRIG 500ML POUR BTL (IV SOLUTION) ×3 IMPLANT
PACK EXTREMITY ARMC (MISCELLANEOUS) IMPLANT
PAD CAST CTTN 4X4 STRL (SOFTGOODS) IMPLANT
PAD GROUND ADULT SPLIT (MISCELLANEOUS) IMPLANT
PADDING CAST COTTON 4X4 STRL (SOFTGOODS)
SPLINT CAST 1 STEP 3X12 (MISCELLANEOUS) IMPLANT
STOCKINETTE STRL 4IN 9604848 (GAUZE/BANDAGES/DRESSINGS) IMPLANT
SUT ETHILON 4-0 (SUTURE)
SUT ETHILON 4-0 FS2 18XMFL BLK (SUTURE)
SUT VICRYL 3-0 27IN (SUTURE) IMPLANT
SUTURE ETHLN 4-0 FS2 18XMF BLK (SUTURE) IMPLANT
SYR 3ML LL SCALE MARK (SYRINGE) IMPLANT

## 2014-08-07 NOTE — OR Nursing (Signed)
Pt repeat blood sugar 67, dr Mikey BussingVan stavern notified. Order received for repeat dose d50.  Dr Mikey Bussingvan stavern in to see pt, while D50  Being given, pt very sleepy, did not arouse,  Until head tilt chin lift, unable to speak or keep eyes open. When pt came around, states she was just sleepy, but seemed still groggy. Oriented x4. repeat blood sugar 141.  Dr Rosita Keamenz notified,  Decision to cancel surgery, and reschedule until Thursday.  Pt given food and will continue to monitor

## 2014-08-07 NOTE — Progress Notes (Addendum)
BS checked and 157   Reported to Dr. Debby FreibergJoe P   ANESTHESIA   VS- BP 99/36 P 78 O2 100 RX GIVEN TO GRANDDAUGHTER

## 2014-08-07 NOTE — Anesthesia Postprocedure Evaluation (Signed)
  Anesthesia Post-op Note  Patient: Savannah ShawlLinda M Key  Procedure(s) Performed: Procedure(s): OPEN REDUCTION INTERNAL FIXATION (ORIF) DISTAL RADIAL FRACTURE (Left)  Anesthesia type:No value filed.  Patient location: PACU  Post pain: Pain level controlled  Post assessment: Post-op Vital signs reviewed, Patient's Cardiovascular Status Stable, Respiratory Function Stable, Patent Airway and No signs of Nausea or vomiting  Post vital signs: Reviewed and stable  Last Vitals:  Filed Vitals:   08/07/14 1506  BP: 130/51  Pulse: 75  Temp: 36.7 C  Resp: 18    Level of consciousness: awake, alert  and patient cooperative  Complications: No apparent anesthesia complications

## 2014-08-07 NOTE — Anesthesia Preprocedure Evaluation (Signed)
Anesthesia Evaluation  Patient identified by MRN, date of birth, ID band Patient awake    Reviewed: Allergy & Precautions, NPO status , Patient's Chart, lab work & pertinent test results  History of Anesthesia Complications Negative for: history of anesthetic complications  Airway        Dental   Pulmonary neg pulmonary ROS,          Cardiovascular hypertension, + CAD, + Past MI and + Peripheral Vascular Disease     Neuro/Psych Depression negative neurological ROS     GI/Hepatic Neg liver ROS, GERD-  ,  Endo/Other  diabetes, Type 1, Insulin Dependent  Renal/GU CRFRenal disease     Musculoskeletal   Abdominal   Peds  Hematology   Anesthesia Other Findings   Reproductive/Obstetrics                             Anesthesia Physical Anesthesia Plan Anesthesia Quick Evaluation

## 2014-08-09 ENCOUNTER — Ambulatory Visit
Admission: RE | Admit: 2014-08-09 | Discharge: 2014-08-09 | Disposition: A | Discharge status: Home / Self Care | Payer: Medicare (Managed Care) | Source: Ambulatory Visit | Attending: Orthopedic Surgery | Admitting: Orthopedic Surgery

## 2014-08-09 ENCOUNTER — Encounter
Admission: RE | Disposition: A | Discharge status: Home / Self Care | Payer: Self-pay | Source: Ambulatory Visit | Attending: Orthopedic Surgery

## 2014-08-09 ENCOUNTER — Ambulatory Visit: Payer: Medicare (Managed Care) | Admitting: Anesthesiology

## 2014-08-09 ENCOUNTER — Ambulatory Visit: Payer: Medicare (Managed Care)

## 2014-08-09 ENCOUNTER — Encounter: Payer: Self-pay | Admitting: *Deleted

## 2014-08-09 DIAGNOSIS — Z9889 Other specified postprocedural states: Secondary | ICD-10-CM

## 2014-08-09 DIAGNOSIS — Z79899 Other long term (current) drug therapy: Secondary | ICD-10-CM | POA: Insufficient documentation

## 2014-08-09 DIAGNOSIS — X58XXXA Exposure to other specified factors, initial encounter: Secondary | ICD-10-CM | POA: Insufficient documentation

## 2014-08-09 DIAGNOSIS — Z833 Family history of diabetes mellitus: Secondary | ICD-10-CM | POA: Insufficient documentation

## 2014-08-09 DIAGNOSIS — E785 Hyperlipidemia, unspecified: Secondary | ICD-10-CM | POA: Insufficient documentation

## 2014-08-09 DIAGNOSIS — M109 Gout, unspecified: Secondary | ICD-10-CM | POA: Insufficient documentation

## 2014-08-09 DIAGNOSIS — Z8781 Personal history of (healed) traumatic fracture: Secondary | ICD-10-CM

## 2014-08-09 DIAGNOSIS — K769 Liver disease, unspecified: Secondary | ICD-10-CM | POA: Insufficient documentation

## 2014-08-09 DIAGNOSIS — Z7982 Long term (current) use of aspirin: Secondary | ICD-10-CM | POA: Insufficient documentation

## 2014-08-09 DIAGNOSIS — Z9049 Acquired absence of other specified parts of digestive tract: Secondary | ICD-10-CM | POA: Insufficient documentation

## 2014-08-09 DIAGNOSIS — S52502A Unspecified fracture of the lower end of left radius, initial encounter for closed fracture: Secondary | ICD-10-CM | POA: Diagnosis not present

## 2014-08-09 DIAGNOSIS — Z955 Presence of coronary angioplasty implant and graft: Secondary | ICD-10-CM | POA: Insufficient documentation

## 2014-08-09 DIAGNOSIS — Z87442 Personal history of urinary calculi: Secondary | ICD-10-CM | POA: Insufficient documentation

## 2014-08-09 DIAGNOSIS — Z809 Family history of malignant neoplasm, unspecified: Secondary | ICD-10-CM | POA: Insufficient documentation

## 2014-08-09 DIAGNOSIS — F329 Major depressive disorder, single episode, unspecified: Secondary | ICD-10-CM | POA: Insufficient documentation

## 2014-08-09 DIAGNOSIS — Z8249 Family history of ischemic heart disease and other diseases of the circulatory system: Secondary | ICD-10-CM | POA: Insufficient documentation

## 2014-08-09 DIAGNOSIS — E119 Type 2 diabetes mellitus without complications: Secondary | ICD-10-CM | POA: Insufficient documentation

## 2014-08-09 DIAGNOSIS — Z882 Allergy status to sulfonamides status: Secondary | ICD-10-CM | POA: Insufficient documentation

## 2014-08-09 DIAGNOSIS — I10 Essential (primary) hypertension: Secondary | ICD-10-CM | POA: Insufficient documentation

## 2014-08-09 DIAGNOSIS — I2510 Atherosclerotic heart disease of native coronary artery without angina pectoris: Secondary | ICD-10-CM | POA: Insufficient documentation

## 2014-08-09 DIAGNOSIS — I252 Old myocardial infarction: Secondary | ICD-10-CM | POA: Insufficient documentation

## 2014-08-09 DIAGNOSIS — Z794 Long term (current) use of insulin: Secondary | ICD-10-CM | POA: Insufficient documentation

## 2014-08-09 HISTORY — PX: OPEN REDUCTION INTERNAL FIXATION (ORIF) DISTAL RADIAL FRACTURE: SHX5989

## 2014-08-09 LAB — POTASSIUM: POTASSIUM: 4.1 mmol/L (ref 3.5–5.1)

## 2014-08-09 LAB — GLUCOSE, CAPILLARY: Glucose-Capillary: 188 mg/dL — ABNORMAL HIGH (ref 65–99)

## 2014-08-09 SURGERY — OPEN REDUCTION INTERNAL FIXATION (ORIF) DISTAL RADIUS FRACTURE
Anesthesia: General | Laterality: Left

## 2014-08-09 MED ORDER — METOCLOPRAMIDE HCL 10 MG PO TABS: 5.0000 mg | ORAL_TABLET | Freq: Three times a day (TID) | ORAL | Status: DC | PRN

## 2014-08-09 MED ORDER — SODIUM CHLORIDE 0.9 % IV SOLN
INTRAVENOUS | Status: DC
  Administered 2014-08-09 (×2): via INTRAVENOUS

## 2014-08-09 MED ORDER — LIDOCAINE HCL (CARDIAC) 20 MG/ML IV SOLN
INTRAVENOUS | Status: DC | PRN
  Administered 2014-08-09: 50 mg via INTRAVENOUS

## 2014-08-09 MED ORDER — SODIUM CHLORIDE 0.9 % IV SOLN: INTRAVENOUS | Status: DC

## 2014-08-09 MED ORDER — CEFAZOLIN SODIUM 1-5 GM-% IV SOLN
1.0000 g | Freq: Once | INTRAVENOUS | Status: AC
  Administered 2014-08-09: 1 g via INTRAVENOUS

## 2014-08-09 MED ORDER — NEOMYCIN-POLYMYXIN B GU 40-200000 IR SOLN
Status: AC
  Filled 2014-08-09: qty 2

## 2014-08-09 MED ORDER — FENTANYL CITRATE (PF) 100 MCG/2ML IJ SOLN
INTRAMUSCULAR | Status: AC
  Filled 2014-08-09: qty 2

## 2014-08-09 MED ORDER — ONDANSETRON HCL 4 MG/2ML IJ SOLN: 4.0000 mg | Freq: Four times a day (QID) | INTRAMUSCULAR | Status: DC | PRN

## 2014-08-09 MED ORDER — METOCLOPRAMIDE HCL 5 MG/ML IJ SOLN: 5.0000 mg | Freq: Three times a day (TID) | INTRAMUSCULAR | Status: DC | PRN

## 2014-08-09 MED ORDER — FENTANYL CITRATE (PF) 100 MCG/2ML IJ SOLN
INTRAMUSCULAR | Status: AC
  Administered 2014-08-09: 25 ug via INTRAVENOUS
  Filled 2014-08-09: qty 2

## 2014-08-09 MED ORDER — ONDANSETRON HCL 4 MG/2ML IJ SOLN
INTRAMUSCULAR | Status: DC | PRN
  Administered 2014-08-09: 4 mg via INTRAVENOUS

## 2014-08-09 MED ORDER — FENTANYL CITRATE (PF) 100 MCG/2ML IJ SOLN
INTRAMUSCULAR | Status: DC | PRN
  Administered 2014-08-09: 100 ug via INTRAVENOUS
  Administered 2014-08-09: 50 ug via INTRAVENOUS

## 2014-08-09 MED ORDER — ONDANSETRON HCL 4 MG PO TABS: 4.0000 mg | ORAL_TABLET | Freq: Four times a day (QID) | ORAL | Status: DC | PRN

## 2014-08-09 MED ORDER — ROCURONIUM BROMIDE 100 MG/10ML IV SOLN
INTRAVENOUS | Status: DC | PRN
  Administered 2014-08-09: 30 mg via INTRAVENOUS

## 2014-08-09 MED ORDER — DEXAMETHASONE SODIUM PHOSPHATE 4 MG/ML IJ SOLN
INTRAMUSCULAR | Status: DC | PRN
  Administered 2014-08-09: 5 mg via INTRAVENOUS

## 2014-08-09 MED ORDER — PROPOFOL 10 MG/ML IV BOLUS
INTRAVENOUS | Status: DC | PRN
  Administered 2014-08-09: 150 mg via INTRAVENOUS

## 2014-08-09 MED ORDER — CEFAZOLIN SODIUM 1-5 GM-% IV SOLN
INTRAVENOUS | Status: AC
  Filled 2014-08-09: qty 50

## 2014-08-09 MED ORDER — ESMOLOL HCL 10 MG/ML IV SOLN
INTRAVENOUS | Status: DC | PRN
  Administered 2014-08-09: 20 mg via INTRAVENOUS

## 2014-08-09 MED ORDER — FENTANYL CITRATE (PF) 100 MCG/2ML IJ SOLN
25.0000 ug | INTRAMUSCULAR | Status: DC | PRN
  Administered 2014-08-09 (×2): 25 ug via INTRAVENOUS
  Administered 2014-08-09: 50 ug via INTRAVENOUS
  Administered 2014-08-09 (×2): 25 ug via INTRAVENOUS

## 2014-08-09 MED ORDER — OXYCODONE HCL 5 MG PO TABS: 5.0000 mg | ORAL_TABLET | ORAL | Status: DC | PRN

## 2014-08-09 SURGICAL SUPPLY — 37 items
BANDAGE ELASTIC 4 CLIP ST LF (GAUZE/BANDAGES/DRESSINGS) ×3 IMPLANT
BIT DRILL 2 FAST STEP (BIT) ×6 IMPLANT
BIT DRILL 2.5X4 QC (BIT) ×3 IMPLANT
CANISTER SUCT 1200ML W/VALVE (MISCELLANEOUS) ×3 IMPLANT
CHLORAPREP W/TINT 26ML (MISCELLANEOUS) ×3 IMPLANT
CUFF TOURN DUAL PL 12 NO SLV (MISCELLANEOUS) ×3 IMPLANT
DRAPE FLUOR MINI C-ARM 54X84 (DRAPES) ×3 IMPLANT
GAUZE PETRO XEROFOAM 1X8 (MISCELLANEOUS) ×6 IMPLANT
GAUZE SPONGE 4X4 12PLY STRL (GAUZE/BANDAGES/DRESSINGS) ×3 IMPLANT
GLOVE BIOGEL PI IND STRL 9 (GLOVE) ×1 IMPLANT
GLOVE BIOGEL PI INDICATOR 9 (GLOVE) ×2
GLOVE SURG ORTHO 9.0 STRL STRW (GLOVE) ×3 IMPLANT
GOWN SPECIALTY ULTRA XL (MISCELLANEOUS) ×3 IMPLANT
GOWN STRL REUS W/ TWL LRG LVL3 (GOWN DISPOSABLE) ×1 IMPLANT
GOWN STRL REUS W/TWL 2XL LVL3 (GOWN DISPOSABLE) ×3 IMPLANT
GOWN STRL REUS W/TWL LRG LVL3 (GOWN DISPOSABLE) ×2
KIT RM TURNOVER STRD PROC AR (KITS) ×3 IMPLANT
NEEDLE FILTER BLUNT 18X 1/2SAF (NEEDLE) ×2
NEEDLE FILTER BLUNT 18X1 1/2 (NEEDLE) ×1 IMPLANT
NS IRRIG 500ML POUR BTL (IV SOLUTION) ×3 IMPLANT
PACK EXTREMITY ARMC (MISCELLANEOUS) ×3 IMPLANT
PAD CAST CTTN 4X4 STRL (SOFTGOODS) ×2 IMPLANT
PAD GROUND ADULT SPLIT (MISCELLANEOUS) ×3 IMPLANT
PADDING CAST COTTON 4X4 STRL (SOFTGOODS) ×4
PEG SUBCHONDRAL SMOOTH 2.0X14 (Peg) ×3 IMPLANT
PEG SUBCHONDRAL SMOOTH 2.0X22 (Peg) ×3 IMPLANT
PEG THREADED 2.5MMX20MM LONG (Peg) ×3 IMPLANT
PLATE SHORT 21.6X48.9 NRRW LT (Plate) ×3 IMPLANT
SCREW MULTI DIRECT 18MM (Screw) ×6 IMPLANT
SCREW MULTI DIRECT 22MM (Screw) ×3 IMPLANT
SPLINT CAST 1 STEP 3X12 (MISCELLANEOUS) ×3 IMPLANT
STOCKINETTE STRL 4IN 9604848 (GAUZE/BANDAGES/DRESSINGS) ×3 IMPLANT
SUT ETHILON 4-0 (SUTURE) ×2
SUT ETHILON 4-0 FS2 18XMFL BLK (SUTURE) ×1
SUT VICRYL 3-0 27IN (SUTURE) ×3 IMPLANT
SUTURE ETHLN 4-0 FS2 18XMF BLK (SUTURE) ×1 IMPLANT
SYR 3ML LL SCALE MARK (SYRINGE) ×3 IMPLANT

## 2014-08-09 NOTE — H&P (Signed)
Reviewed paper H+P, will be scanned into chart. No changes noted.  Has developed diarrhea, h/o C diff. Appropriate precautions in hospital

## 2014-08-09 NOTE — Anesthesia Procedure Notes (Signed)
Procedure Name: Intubation Date/Time: 08/09/2014 10:02 AM Performed by: Sherron Flemings Pre-anesthesia Checklist: Patient identified, Patient being monitored, Timeout performed, Emergency Drugs available and Suction available Patient Re-evaluated:Patient Re-evaluated prior to inductionOxygen Delivery Method: Circle system utilized Preoxygenation: Pre-oxygenation with 100% oxygen Intubation Type: IV induction Ventilation: Mask ventilation without difficulty Laryngoscope Size: Mac and 3 Grade View: Grade I Tube type: Oral Tube size: 7.0 mm Number of attempts: 1 Airway Equipment and Method: Stylet Placement Confirmation: ETT inserted through vocal cords under direct vision,  positive ETCO2 and breath sounds checked- equal and bilateral Secured at: 21 cm Tube secured with: Tape Dental Injury: Teeth and Oropharynx as per pre-operative assessment

## 2014-08-09 NOTE — Anesthesia Postprocedure Evaluation (Signed)
  Anesthesia Post-op Note  [name]  Procedure(s) Performed: Procedure(s): OPEN REDUCTION INTERNAL FIXATION (ORIF) DISTAL RADIAL FRACTURE (Left)  Anesthesia type:General ETT  Patient location: PACU  Post pain: Pain level controlled  Post assessment: Post-op Vital signs reviewed, Patient's Cardiovascular Status Stable, Respiratory Function Stable, Patent Airway and No signs of Nausea or vomiting  Post vital signs: Reviewed and stable  Last Vitals:  Filed Vitals:   08/09/14 1220  BP: 123/49  Pulse: 87  Temp: 36.8 C  Resp: 18    Level of consciousness: awake, alert  and patient cooperative  Complications: No apparent anesthesia complications

## 2014-08-09 NOTE — Discharge Instructions (Signed)
Ice and elevation.  Keep dressing clean and dry.  Do not remove.     AMBULATORY SURGERY  DISCHARGE INSTRUCTIONS   1) The drugs that you were given will stay in your system until tomorrow so for the next 24 hours you should not:  A) Drive an automobile B) Make any legal decisions C) Drink any alcoholic beverage   2) You may resume regular meals tomorrow.  Today it is better to start with liquids and gradually work up to solid foods.  You may eat anything you prefer, but it is better to start with liquids, then soup and crackers, and gradually work up to solid foods.   3) Please notify your doctor immediately if you have any unusual bleeding, trouble breathing, redness and pain at the surgery site, drainage, fever, or pain not relieved by medication.    4) Additional Instructions:   Please contact your physician with any problems or Same Day Surgery at [phone], Monday through Friday 6 am to 4 pm, or [place] at [facility] number at [phone].

## 2014-08-09 NOTE — Op Note (Signed)
08/09/2014  11:13 AM  PATIENT:  Savannah Key  74 y.o. female  PRE-OPERATIVE DIAGNOSIS:  left distal radius fracture  POST-OPERATIVE DIAGNOSIS:  same   PROCEDURE:  Procedure(s): OPEN REDUCTION INTERNAL FIXATION (ORIF) DISTAL RADIAL FRACTURE (Left)  SURGEON: Leitha Schuller, MD  ASSISTANTS: None  ANESTHESIA:   general  EBL:    minimal  BLOOD ADMINISTERED:none  DRAINS: none   LOCAL MEDICATIONS USED:  NONE  SPECIMEN:  No Specimen  DISPOSITION OF SPECIMEN:  N/A  COUNTS:  YES  TOURNIQUET:   Total Tourniquet Time Documented: Forearm (Left) - 30 minutes Total: Forearm (Left) - 30 minutes   IMPLANTS: [employer] hand [employer] short narrow DVR plate with multiple screws and pegs  DICTATION: .Dragon Dictation patient brought the operating room and after adequate general anesthesia was obtained left arm is prepped draped in sterile fashion. A sterile tourniquet was placed below the elbow secondary to the fistula above the elbow. After appropriate patient education timeout procedure completed fingertrap traction was placed to the index and middle fingers with a tourniquet  raised to 225 mmHg. A volar approach is made centered over the FCR tendon the tendon sheath was incised and the tendon retracted radially deep fascia incised in the pronator elevated off the distal radius. Traction the fracture reduced with restoration of length volar tilt was not completely corrected radial inclination appeared state to be in good position. Bone was quite soft comminuted and intra-articular multiple fragments the short narrow DVR plate was applied and pinned into position checking position distal pegs were filled first with multidirectional screws also utilized to avoid penetrating the joint. mini C-arm views were carried out to make sure there is no penetration of the joint distally. 310 mm screws were placed in the shaft in the traction was removed under fluoroscopic exam the fracture appeared stable  with acceptable reduction. Wound was thoroughly irrigated and the tourniquet let down there is no excessive bleeding the wound was closed with 30 Vioxx A and 4-0 nylon for the skin Xeroform 4 x 4's web roll for splint and Ace wrap were applied  PLAN OF CARE: Discharge to home after PACU  PATIENT DISPOSITION:  PACU - hemodynamically stable.

## 2014-08-09 NOTE — Anesthesia Preprocedure Evaluation (Addendum)
Anesthesia Evaluation  Patient identified by MRN, date of birth, ID band Patient awake    Reviewed: Allergy & Precautions, H&P , NPO status , Patient's Chart, lab work & pertinent test results, reviewed documented beta blocker date and time   History of Anesthesia Complications Negative for: history of anesthetic complications  Airway Mallampati: II  TM Distance: >3 FB Neck ROM: full    Dental  (+) Poor Dentition, Chipped, Missing, Upper Dentures, Lower Dentures   Pulmonary neg pulmonary ROS,  breath sounds clear to auscultation  + decreased breath sounds      Cardiovascular Exercise Tolerance: Poor hypertension, + CAD, + Past MI and + Peripheral Vascular Disease Normal cardiovascular examRhythm:regular Rate:Normal     Neuro/Psych PSYCHIATRIC DISORDERS Depression negative neurological ROS     GI/Hepatic Neg liver ROS, GERD-  Controlled,  Endo/Other  diabetes, Type 2, Insulin DependentHypothyroidism   Renal/GU CRFRenal disease  negative genitourinary   Musculoskeletal   Abdominal   Peds  Hematology negative hematology ROS (+)   Anesthesia Other Findings Past Medical History:   Chronic kidney disease                                       GERD (gastroesophageal reflux disease)                       Carotid artery stenosis                                      Coronary artery disease                                      Hypothyroidism                                               Diabetes mellitus without complication                       Depression                                                   Arthritis                                                    Kidney failure                                               Liver failure  MI (myocardial infarction)                                   HTN (hypertension)                                           Broken leg                                                      Comment:R   Reproductive/Obstetrics negative OB ROS                            Anesthesia Physical  Anesthesia Plan  ASA: IV  Anesthesia Plan: General ETT   Post-op Pain Management:    Induction:   Airway Management Planned:   Additional Equipment:   Intra-op Plan:   Post-operative Plan:   Informed Consent: I have reviewed the patients History and Physical, chart, labs and discussed the procedure including the risks, benefits and alternatives for the proposed anesthesia with the patient or authorized representative who has indicated his/her understanding and acceptance.   Dental Advisory Given  Plan Discussed with: Anesthesiologist, CRNA and Surgeon  Anesthesia Plan Comments:         Anesthesia Quick Evaluation

## 2014-08-09 NOTE — Transfer of Care (Signed)
Immediate Anesthesia Transfer of Care Note  [name]  Procedure(s) Performed: Procedure(s): OPEN REDUCTION INTERNAL FIXATION (ORIF) DISTAL RADIAL FRACTURE (Left)  Patient Location: PACU  Anesthesia Type:General  Level of Consciousness: Alert, Awake, Oriented  Airway & Oxygen Therapy: Patient Spontanous Breathing  Post-op Assessment: Report given to RN  Post vital signs: Reviewed and stable  Last Vitals:  Filed Vitals:   08/09/14 1114  BP: 145/71  Pulse: 94  Temp: 36.7 C  Resp: 29    Complications: No apparent anesthesia complications

## 2014-08-11 ENCOUNTER — Encounter: Payer: Self-pay | Admitting: Emergency Medicine

## 2014-08-11 ENCOUNTER — Emergency Department: Payer: Medicare (Managed Care)

## 2014-08-11 ENCOUNTER — Emergency Department
Admission: EM | Admit: 2014-08-11 | Discharge: 2014-08-11 | Disposition: A | Discharge status: Home / Self Care | Payer: Medicare (Managed Care) | Attending: Emergency Medicine | Admitting: Emergency Medicine

## 2014-08-11 DIAGNOSIS — L539 Erythematous condition, unspecified: Secondary | ICD-10-CM | POA: Diagnosis not present

## 2014-08-11 DIAGNOSIS — R531 Weakness: Secondary | ICD-10-CM | POA: Diagnosis present

## 2014-08-11 DIAGNOSIS — Z992 Dependence on renal dialysis: Secondary | ICD-10-CM | POA: Insufficient documentation

## 2014-08-11 DIAGNOSIS — Z79899 Other long term (current) drug therapy: Secondary | ICD-10-CM | POA: Insufficient documentation

## 2014-08-11 DIAGNOSIS — I120 Hypertensive chronic kidney disease with stage 5 chronic kidney disease or end stage renal disease: Secondary | ICD-10-CM | POA: Insufficient documentation

## 2014-08-11 DIAGNOSIS — Z794 Long term (current) use of insulin: Secondary | ICD-10-CM | POA: Insufficient documentation

## 2014-08-11 DIAGNOSIS — E119 Type 2 diabetes mellitus without complications: Secondary | ICD-10-CM | POA: Insufficient documentation

## 2014-08-11 DIAGNOSIS — N186 End stage renal disease: Secondary | ICD-10-CM | POA: Insufficient documentation

## 2014-08-11 DIAGNOSIS — R0602 Shortness of breath: Secondary | ICD-10-CM | POA: Insufficient documentation

## 2014-08-11 DIAGNOSIS — Z7952 Long term (current) use of systemic steroids: Secondary | ICD-10-CM | POA: Diagnosis not present

## 2014-08-11 DIAGNOSIS — R197 Diarrhea, unspecified: Secondary | ICD-10-CM | POA: Insufficient documentation

## 2014-08-11 DIAGNOSIS — Z7982 Long term (current) use of aspirin: Secondary | ICD-10-CM | POA: Insufficient documentation

## 2014-08-11 LAB — CBC WITH DIFFERENTIAL/PLATELET
BASOS ABS: 0 10*3/uL (ref 0–0.1)
Basophils Relative: 1 %
Eosinophils Absolute: 0 10*3/uL (ref 0–0.7)
Eosinophils Relative: 1 %
HCT: 34.1 % — ABNORMAL LOW (ref 35.0–47.0)
Hemoglobin: 10.9 g/dL — ABNORMAL LOW (ref 12.0–16.0)
Lymphocytes Relative: 12 %
Lymphs Abs: 0.6 10*3/uL — ABNORMAL LOW (ref 1.0–3.6)
MCH: 27.5 pg (ref 26.0–34.0)
MCHC: 31.9 g/dL — ABNORMAL LOW (ref 32.0–36.0)
MCV: 86.3 fL (ref 80.0–100.0)
Monocytes Absolute: 0.6 10*3/uL (ref 0.2–0.9)
Monocytes Relative: 13 %
NEUTROS ABS: 3.7 10*3/uL (ref 1.4–6.5)
Platelets: 88 10*3/uL — ABNORMAL LOW (ref 150–440)
RBC: 3.96 MIL/uL (ref 3.80–5.20)
RDW: 19.2 % — AB (ref 11.5–14.5)
WBC: 5 10*3/uL (ref 3.6–11.0)

## 2014-08-11 LAB — COMPREHENSIVE METABOLIC PANEL
ALK PHOS: 118 U/L (ref 38–126)
ALT: 16 U/L (ref 14–54)
AST: 29 U/L (ref 15–41)
Albumin: 3.2 g/dL — ABNORMAL LOW (ref 3.5–5.0)
Anion gap: 13 (ref 5–15)
BUN: 18 mg/dL (ref 6–20)
CO2: 26 mmol/L (ref 22–32)
Calcium: 8 mg/dL — ABNORMAL LOW (ref 8.9–10.3)
Chloride: 97 mmol/L — ABNORMAL LOW (ref 101–111)
Creatinine, Ser: 2.55 mg/dL — ABNORMAL HIGH (ref 0.44–1.00)
GFR, EST AFRICAN AMERICAN: 20 mL/min — AB (ref >60)
GFR, EST NON AFRICAN AMERICAN: 18 mL/min — AB (ref >60)
Glucose, Bld: 228 mg/dL — ABNORMAL HIGH (ref 65–99)
Potassium: 4.1 mmol/L (ref 3.5–5.1)
SODIUM: 136 mmol/L (ref 135–145)
Total Bilirubin: 0.8 mg/dL (ref 0.3–1.2)
Total Protein: 5.8 g/dL — ABNORMAL LOW (ref 6.5–8.1)

## 2014-08-11 LAB — URINALYSIS COMPLETE WITH MICROSCOPIC (ARMC ONLY)
Bilirubin Urine: NEGATIVE
Glucose, UA: 50 mg/dL — AB
LEUKOCYTES UA: NEGATIVE
Nitrite: NEGATIVE
PH: 9 — AB (ref 5.0–8.0)
PROTEIN: 30 mg/dL — AB
Specific Gravity, Urine: 1.009 (ref 1.005–1.030)

## 2014-08-11 LAB — TROPONIN I

## 2014-08-11 MED ORDER — PROMETHAZINE HCL 25 MG/ML IJ SOLN
12.5000 mg | Freq: Once | INTRAMUSCULAR | Status: AC
  Administered 2014-08-11: 12.5 mg via INTRAVENOUS
  Filled 2014-08-11: qty 1

## 2014-08-11 MED ORDER — SODIUM CHLORIDE 0.9 % IV BOLUS (SEPSIS)
500.0000 mL | Freq: Once | INTRAVENOUS | Status: AC
  Administered 2014-08-11: 500 mL via INTRAVENOUS

## 2014-08-11 MED ORDER — SODIUM CHLORIDE 0.9 % IV SOLN: Freq: Once | INTRAVENOUS | Status: DC

## 2014-08-11 NOTE — ED Notes (Signed)
Bedside report received from Shane A., RN.  

## 2014-08-11 NOTE — ED Notes (Signed)
Patient denies pain and is resting comfortably.  

## 2014-08-11 NOTE — Discharge Instructions (Signed)
Please seek medical attention for any high fevers, chest pain, shortness of breath, change in behavior, persistent vomiting, bloody stool or any other new or concerning symptoms. ° °Weakness °Weakness is a lack of strength. It may be felt all over the body (generalized) or in one specific part of the body (focal). Some causes of weakness can be serious. You may need further medical evaluation, especially if you are elderly or you have a history of immunosuppression (such as chemotherapy or HIV), kidney disease, heart disease, or diabetes. °CAUSES  °Weakness can be caused by many different things, including: °· Infection. °· Physical exhaustion. °· Internal bleeding or other blood loss that results in a lack of red blood cells (anemia). °· Dehydration. This cause is more common in elderly people. °· Side effects or electrolyte abnormalities from medicines, such as pain medicines or sedatives. °· Emotional distress, anxiety, or depression. °· Circulation problems, especially severe peripheral arterial disease. °· Heart disease, such as rapid atrial fibrillation, bradycardia, or heart failure. °· Nervous system disorders, such as Guillain-Barré syndrome, multiple sclerosis, or stroke. °DIAGNOSIS  °To find the cause of your weakness, your caregiver will take your history and perform a physical exam. Lab tests or X-rays may also be ordered, if needed. °TREATMENT  °Treatment of weakness depends on the cause of your symptoms and can vary greatly. °HOME CARE INSTRUCTIONS  °· Rest as needed. °· Eat a well-balanced diet. °· Try to get some exercise every day. °· Only take over-the-counter or prescription medicines as directed by your caregiver. °SEEK MEDICAL CARE IF:  °· Your weakness seems to be getting worse or spreads to other parts of your body. °· You develop new aches or pains. °SEEK IMMEDIATE MEDICAL CARE IF:  °· You cannot perform your normal daily activities, such as getting dressed and feeding yourself. °· You  cannot walk up and down stairs, or you feel exhausted when you do so. °· You have shortness of breath or chest pain. °· You have difficulty moving parts of your body. °· You have weakness in only one area of the body or on only one side of the body. °· You have a fever. °· You have trouble speaking or swallowing. °· You cannot control your bladder or bowel movements. °· You have black or bloody vomit or stools. °MAKE SURE YOU: °· Understand these instructions. °· Will watch your condition. °· Will get help right away if you are not doing well or get worse. °Document Released: 01/05/2005 Document Revised: 07/07/2011 Document Reviewed: 03/06/2011 °[employer]® Patient Information ©2015 [employer]. This information is not intended to replace advice given to you by your health care provider. Make sure you discuss any questions you have with your health care provider. ° °

## 2014-08-11 NOTE — ED Notes (Signed)
RN spoke with pts PCP [phone]) in regards to pts progress. Will call back with updated info regarding pts potential d/c.

## 2014-08-11 NOTE — ED Provider Notes (Signed)
[facility] Emergency Department Provider Note   ____________________________________________  Time seen: 1225  I have reviewed the triage vital signs and the nursing notes.   HISTORY  Chief Complaint Diarrhea; Weakness; and Shortness of Breath   History limited by: Not Limited   HPI Savannah Key is a 74 y.o. female who presents to the emergency department today because she has felt sick for the past week. She has multiple complaints as to why she is feeling sick. She states she has been having diarrhea. She is having multiple episodes a day. She had 4 episodes last night. Additionally the patient is complaining of feeling fatigued and tired. Furthermore she is complaining of feeling numbness in her legs. She is concerned that she might be having recurrent cellulitis in her right lower leg. She states that the last time she felt this bad was when she had a C. difficile. She in fact was given a prescription for Flagyl by mouth and says she had taken roughly 24 hours worth of that medication. She has not felt any better.     Past Medical History  Diagnosis Date  . Chronic kidney disease   . GERD (gastroesophageal reflux disease)   . Carotid artery stenosis   . Coronary artery disease   . Hypothyroidism   . Diabetes mellitus without complication   . Depression   . Arthritis   . Kidney failure   . Liver failure   . MI (myocardial infarction)   . HTN (hypertension)   . Broken leg     R    There are no active problems to display for this patient.   Past Surgical History  Procedure Laterality Date  . Av fistula placement Left   . Cholecystectomy    . Peripheral vascular catheterization N/A 06/11/2014    Procedure: A/V Shuntogram/Fistulagram;  Surgeon: Annice Needy, MD;  Location: [facility] INVASIVE CV LAB;  Service: Cardiovascular;  Laterality: N/A;  . Peripheral vascular catheterization Left 06/11/2014    Procedure: A/V Shunt Intervention;  Surgeon:  Annice Needy, MD;  Location: [facility] INVASIVE CV LAB;  Service: Cardiovascular;  Laterality: Left;  . Open reduction internal fixation (orif) distal radial fracture Left 08/09/2014    Procedure: OPEN REDUCTION INTERNAL FIXATION (ORIF) DISTAL RADIAL FRACTURE;  Surgeon: Kennedy Bucker, MD;  Location: [facility] ORS;  Service: Orthopedics;  Laterality: Left;    Current Outpatient Rx  Name  Route  Sig  Dispense  Refill  . aspirin EC 81 MG tablet   Oral   Take by mouth daily.         Marland Kitchen atorvastatin (LIPITOR) 20 MG tablet   Oral   Take 80 mg by mouth at bedtime.          . bisacodyl (DULCOLAX) 5 MG EC tablet   Oral   Take 5 mg by mouth at bedtime.          . calcium acetate (PHOSLO) 667 MG capsule   Oral   Take 667 mg by mouth 3 (three) times daily with meals.         . calcium carbonate (TUMS - DOSED IN MG ELEMENTAL CALCIUM) 500 MG chewable tablet   Oral   Chew 1 tablet by mouth 3 (three) times daily.         . cephALEXin (KEFLEX) 250 MG capsule   Oral   Take 250 mg by mouth at bedtime.          . cetirizine (ZYRTEC) 5 MG  tablet   Oral   Take 5 mg by mouth at bedtime.          . citalopram (CELEXA) 10 MG tablet   Oral   Take 10 mg by mouth daily.         . ergocalciferol (VITAMIN D2) 50000 UNITS capsule   Oral   Take 50,000 Units by mouth once a week.         . famotidine (PEPCID) 20 MG tablet   Oral   Take 20 mg by mouth daily with breakfast.          . ferrous fumarate (HEMOCYTE - 106 MG FE) 325 (106 FE) MG TABS tablet   Oral   Take 1 tablet by mouth.         . hydrOXYzine (ATARAX/VISTARIL) 10 MG tablet   Oral   Take 10 mg by mouth 3 (three) times daily as needed.         . insulin glargine (LANTUS) 100 UNIT/ML injection   Subcutaneous   Inject 90 Units into the skin every morning.          . insulin glargine (LANTUS) 100 UNIT/ML injection   Subcutaneous   Inject 50 Units into the skin daily before supper.         . insulin NPH Human (HUMULIN  N,NOVOLIN N) 100 UNIT/ML injection   Subcutaneous   Inject 15 Units into the skin 3 (three) times daily before meals.          . isosorbide mononitrate (IMDUR) 60 MG 24 hr tablet   Oral   Take 60 mg by mouth daily before breakfast.         . levothyroxine (SYNTHROID, LEVOTHROID) 100 MCG tablet   Oral   Take 75 mcg by mouth daily before breakfast.          . lidocaine (LIDODERM) 5 %   Transdermal   Place 1 patch onto the skin daily. Remove & Discard patch within 12 hours or as directed by MD         . mineral oil-hydrophilic petrolatum (AQUAPHOR) ointment   Topical   Apply 1 application topically as needed for dry skin.         . nadolol (CORGARD) 40 MG tablet   Oral   Take 40 mg by mouth daily.         . Nutritional Supplements (FEEDING SUPPLEMENT, NEPRO CARB STEADY,) LIQD   Oral   Take 237 mLs by mouth 3 (three) times daily between meals.         Marland Kitchen omeprazole (PRILOSEC) 20 MG capsule   Oral   Take 20 mg by mouth daily.         Marland Kitchen oxyCODONE-acetaminophen (ROXICET) 5-325 MG per tablet   Oral   Take 1 tablet by mouth every 8 (eight) hours as needed for moderate pain or severe pain (Do not drive or operate heavy machinery while taking as can cause drowsiness.).   9 tablet   0   . pantoprazole (PROTONIX) 40 MG tablet   Oral   Take 40 mg by mouth 2 (two) times daily.         . polyethylene glycol (MIRALAX / GLYCOLAX) packet   Oral   Take 17 g by mouth every other day. Mix in 4 to 8 ounces of fluid before taking.         . pregabalin (LYRICA) 50 MG capsule   Oral   Take 75 mg by mouth  at bedtime.          Marland Kitchen rOPINIRole (REQUIP) 2 MG tablet   Oral   Take 2 mg by mouth at bedtime.         . selenium sulfide (SELSUN) 2.5 % shampoo   Topical   Apply 1 application topically every 7 (seven) weeks.         . sodium chloride (OCEAN) 0.65 % SOLN nasal spray   Each Nare   Place 1 spray into both nostrils as needed for congestion.         .  traZODone (DESYREL) 50 MG tablet   Oral   Take 50 mg by mouth at bedtime.         . triamcinolone cream (KENALOG) 0.1 %   Topical   Apply 1 application topically daily as needed.         . trolamine salicylate (ASPERCREME) 10 % cream   Topical   Apply 1 application topically every 3 (three) days.         . Vaginal Lubricant (REPLENS) GEL   Vaginal   Place 1 application vaginally as needed.         . venlafaxine (EFFEXOR) 37.5 MG tablet   Oral   Take 37.5 mg by mouth at bedtime.            Allergies Sulfa antibiotics  History reviewed. No pertinent family history.  Social History History  Substance Use Topics  . Smoking status: Never Smoker   . Smokeless tobacco: Not on file  . Alcohol Use: No    Review of Systems  Constitutional: Negative for fever. Positive for weakness Cardiovascular: Negative for chest pain. Respiratory: Positive for shortness of breath. Gastrointestinal: Positive for diarrhea Genitourinary: Negative for dysuria. Musculoskeletal: Negative for back pain. Skin: Negative for rash. Neurological: Negative for headaches, focal weakness or numbness.   10-point ROS otherwise negative.  ____________________________________________   PHYSICAL EXAM:  VITAL SIGNS: ED Triage Vitals  Enc Vitals Group     BP 08/11/14 1024 140/65 mmHg     Pulse Rate 08/11/14 1024 67     Resp 08/11/14 1024 25     Temp 08/11/14 1024 98 F (36.7 C)     Temp Source 08/11/14 1024 Oral     SpO2 08/11/14 1024 97 %   Constitutional: Alert and oriented. Well appearing and in no distress. Eyes: Conjunctivae are normal. PERRL. Normal extraocular movements. ENT   Head: Normocephalic and atraumatic.   Nose: No congestion/rhinnorhea.   Mouth/Throat: Mucous membranes are moist.   Neck: No stridor. Hematological/Lymphatic/Immunilogical: No cervical lymphadenopathy. Cardiovascular: Normal rate, regular rhythm.  No murmurs, rubs, or  gallops. Respiratory: Normal respiratory effort without tachypnea nor retractions. Breath sounds are clear and equal bilaterally. No wheezes/rales/rhonchi. Gastrointestinal: Soft and nontender. No distention.  Genitourinary: Deferred Musculoskeletal: Left forearm and postsurgical cast. Right lower extremity with some erythema about the shin. No lower extremity tenderness or edema. Neurologic:  Normal speech and language. No gross focal neurologic deficits are appreciated. Speech is normal.  Skin:  Skin is warm, dry and intact. No rash noted. Psychiatric: Mood and affect are normal. Speech and behavior are normal. Patient exhibits appropriate insight and judgment.  ____________________________________________    LABS (pertinent positives/negatives)  Labs Reviewed  CBC WITH DIFFERENTIAL/PLATELET - Abnormal; Notable for the following:    Hemoglobin 10.9 (*)    HCT 34.1 (*)    MCHC 31.9 (*)    RDW 19.2 (*)    Platelets 88 (*)  Lymphs Abs 0.6 (*)    All other components within normal limits  COMPREHENSIVE METABOLIC PANEL - Abnormal; Notable for the following:    Chloride 97 (*)    Glucose, Bld 228 (*)    Creatinine, Ser 2.55 (*)    Calcium 8.0 (*)    Total Protein 5.8 (*)    Albumin 3.2 (*)    GFR calc non Af Amer 18 (*)    GFR calc Af Amer 20 (*)    All other components within normal limits  CULTURE, BLOOD (ROUTINE X 2)  CULTURE, BLOOD (ROUTINE X 2)  TROPONIN I     ____________________________________________   EKG  I, Phineas Semen, attending physician, personally viewed and interpreted this EKG  EKG Time: 1034 Rate: 63 Rhythm: NSR Axis: Left axis deviation Intervals: qtc 476 QRS: narrow, q waves, III, avf ST changes: no st elevation    ____________________________________________    RADIOLOGY  CXR IMPRESSION: Top-normal to mildly enlarged heart. No active disease.  ____________________________________________   PROCEDURES  Procedure(s)  performed: None  Critical Care performed: No  ____________________________________________   INITIAL IMPRESSION / ASSESSMENT AND PLAN / ED COURSE  Pertinent labs & imaging results that were available during my care of the patient were reviewed by me and considered in my medical decision making (see chart for details).  Patient presents to the emergency department because she was feeling ill for the past week. Blood work consistent with previous blood draws. Chest x-ray without any pneumonia. Urine fine. There was some concern for C. difficile however patient was unable to provide a stool sample and is already on treatment for C. difficile. After some fluids patient did feel better and was ready to go home. I did offer to help set up home health, home PT and OT, however patient declined stating she has those services set up already.  ____________________________________________   FINAL CLINICAL IMPRESSION(S) / ED DIAGNOSES  Final diagnoses:  Weakness     Phineas Semen, MD 08/11/14 2113

## 2014-08-11 NOTE — ED Notes (Signed)
Pt tolerated PO challenged successfully.

## 2014-08-11 NOTE — ED Notes (Signed)
Pt reports diarrhea since Saturday, dialysis MWF, more weak and short of breath today.Also reports numbness in her legs that is new.

## 2014-08-16 LAB — CULTURE, BLOOD (ROUTINE X 2)
CULTURE: NO GROWTH
Culture: NO GROWTH

## 2014-10-01 ENCOUNTER — Encounter: Payer: Self-pay | Admitting: Emergency Medicine

## 2014-10-01 ENCOUNTER — Emergency Department: Payer: Medicare (Managed Care)

## 2014-10-01 ENCOUNTER — Inpatient Hospital Stay
Admission: EM | Admit: 2014-10-01 | Discharge: 2014-10-05 | DRG: 280 | Disposition: A | Discharge status: Home / Self Care | Payer: Medicare (Managed Care) | Attending: Internal Medicine | Admitting: Internal Medicine

## 2014-10-01 DIAGNOSIS — E039 Hypothyroidism, unspecified: Secondary | ICD-10-CM | POA: Diagnosis present

## 2014-10-01 DIAGNOSIS — Z7982 Long term (current) use of aspirin: Secondary | ICD-10-CM

## 2014-10-01 DIAGNOSIS — F329 Major depressive disorder, single episode, unspecified: Secondary | ICD-10-CM | POA: Diagnosis present

## 2014-10-01 DIAGNOSIS — Z79899 Other long term (current) drug therapy: Secondary | ICD-10-CM

## 2014-10-01 DIAGNOSIS — I5023 Acute on chronic systolic (congestive) heart failure: Secondary | ICD-10-CM | POA: Diagnosis present

## 2014-10-01 DIAGNOSIS — I252 Old myocardial infarction: Secondary | ICD-10-CM | POA: Diagnosis not present

## 2014-10-01 DIAGNOSIS — D638 Anemia in other chronic diseases classified elsewhere: Secondary | ICD-10-CM | POA: Diagnosis present

## 2014-10-01 DIAGNOSIS — I214 Non-ST elevation (NSTEMI) myocardial infarction: Secondary | ICD-10-CM | POA: Diagnosis present

## 2014-10-01 DIAGNOSIS — J9602 Acute respiratory failure with hypercapnia: Secondary | ICD-10-CM | POA: Diagnosis present

## 2014-10-01 DIAGNOSIS — J449 Chronic obstructive pulmonary disease, unspecified: Secondary | ICD-10-CM | POA: Diagnosis present

## 2014-10-01 DIAGNOSIS — Z794 Long term (current) use of insulin: Secondary | ICD-10-CM | POA: Diagnosis not present

## 2014-10-01 DIAGNOSIS — I2510 Atherosclerotic heart disease of native coronary artery without angina pectoris: Secondary | ICD-10-CM | POA: Diagnosis present

## 2014-10-01 DIAGNOSIS — E872 Acidosis: Secondary | ICD-10-CM | POA: Diagnosis present

## 2014-10-01 DIAGNOSIS — I509 Heart failure, unspecified: Secondary | ICD-10-CM

## 2014-10-01 DIAGNOSIS — E1122 Type 2 diabetes mellitus with diabetic chronic kidney disease: Secondary | ICD-10-CM | POA: Diagnosis present

## 2014-10-01 DIAGNOSIS — N186 End stage renal disease: Secondary | ICD-10-CM | POA: Diagnosis present

## 2014-10-01 DIAGNOSIS — I739 Peripheral vascular disease, unspecified: Secondary | ICD-10-CM | POA: Diagnosis present

## 2014-10-01 DIAGNOSIS — J9601 Acute respiratory failure with hypoxia: Secondary | ICD-10-CM | POA: Diagnosis present

## 2014-10-01 DIAGNOSIS — J9691 Respiratory failure, unspecified with hypoxia: Secondary | ICD-10-CM | POA: Diagnosis present

## 2014-10-01 DIAGNOSIS — N179 Acute kidney failure, unspecified: Secondary | ICD-10-CM | POA: Diagnosis present

## 2014-10-01 DIAGNOSIS — Z7902 Long term (current) use of antithrombotics/antiplatelets: Secondary | ICD-10-CM | POA: Diagnosis not present

## 2014-10-01 DIAGNOSIS — N2581 Secondary hyperparathyroidism of renal origin: Secondary | ICD-10-CM | POA: Diagnosis present

## 2014-10-01 DIAGNOSIS — I120 Hypertensive chronic kidney disease with stage 5 chronic kidney disease or end stage renal disease: Secondary | ICD-10-CM | POA: Diagnosis present

## 2014-10-01 DIAGNOSIS — Z992 Dependence on renal dialysis: Secondary | ICD-10-CM

## 2014-10-01 DIAGNOSIS — I5033 Acute on chronic diastolic (congestive) heart failure: Secondary | ICD-10-CM

## 2014-10-01 LAB — BLOOD GAS, ARTERIAL
Acid-base deficit: 3.4 mmol/L — ABNORMAL HIGH (ref 0.0–2.0)
BICARBONATE: 23.6 meq/L (ref 21.0–28.0)
Delivery systems: POSITIVE
Expiratory PAP: 5
FIO2: 0.8
Inspiratory PAP: 14
O2 SAT: 99.7 %
PATIENT TEMPERATURE: 37
PO2 ART: 218 mmHg — AB (ref 83.0–108.0)
RATE: 12 resp/min
pCO2 arterial: 49 mmHg — ABNORMAL HIGH (ref 32.0–48.0)
pH, Arterial: 7.29 — ABNORMAL LOW (ref 7.350–7.450)

## 2014-10-01 LAB — CBC WITH DIFFERENTIAL/PLATELET
Basophils Absolute: 0.1 10*3/uL (ref 0–0.1)
Basophils Relative: 1 %
EOS ABS: 0.2 10*3/uL (ref 0–0.7)
Eosinophils Relative: 2 %
HCT: 36.3 % (ref 35.0–47.0)
HEMOGLOBIN: 11.3 g/dL — AB (ref 12.0–16.0)
LYMPHS ABS: 0.8 10*3/uL — AB (ref 1.0–3.6)
LYMPHS PCT: 7 %
MCH: 26.1 pg (ref 26.0–34.0)
MCHC: 31 g/dL — ABNORMAL LOW (ref 32.0–36.0)
MCV: 84.3 fL (ref 80.0–100.0)
Monocytes Absolute: 1.1 10*3/uL — ABNORMAL HIGH (ref 0.2–0.9)
Monocytes Relative: 9 %
NEUTROS PCT: 81 %
Neutro Abs: 9.8 10*3/uL — ABNORMAL HIGH (ref 1.4–6.5)
Platelets: 131 10*3/uL — ABNORMAL LOW (ref 150–440)
RBC: 4.31 MIL/uL (ref 3.80–5.20)
RDW: 22.3 % — ABNORMAL HIGH (ref 11.5–14.5)
WBC: 11.9 10*3/uL — AB (ref 3.6–11.0)

## 2014-10-01 LAB — PROTIME-INR
INR: 1.14
Prothrombin Time: 14.8 seconds (ref 11.4–15.0)

## 2014-10-01 LAB — GLUCOSE, CAPILLARY
Glucose-Capillary: 325 mg/dL — ABNORMAL HIGH (ref 65–99)
Glucose-Capillary: 407 mg/dL — ABNORMAL HIGH (ref 65–99)
Glucose-Capillary: 407 mg/dL — ABNORMAL HIGH (ref 65–99)
Glucose-Capillary: 371 mg/dL — ABNORMAL HIGH (ref 65–99)

## 2014-10-01 LAB — HEMOGLOBIN A1C: Hgb A1c MFr Bld: 7 % — ABNORMAL HIGH (ref 4.0–6.0)

## 2014-10-01 LAB — COMPREHENSIVE METABOLIC PANEL
ALT: 29 U/L (ref 14–54)
AST: 45 U/L — ABNORMAL HIGH (ref 15–41)
Albumin: 3.7 g/dL (ref 3.5–5.0)
Alkaline Phosphatase: 146 U/L — ABNORMAL HIGH (ref 38–126)
Anion gap: 11 (ref 5–15)
BUN: 52 mg/dL — AB (ref 6–20)
CALCIUM: 9.8 mg/dL (ref 8.9–10.3)
CO2: 24 mmol/L (ref 22–32)
CREATININE: 4.07 mg/dL — AB (ref 0.44–1.00)
Chloride: 104 mmol/L (ref 101–111)
GFR calc non Af Amer: 10 mL/min — ABNORMAL LOW (ref >60)
GFR, EST AFRICAN AMERICAN: 12 mL/min — AB (ref >60)
Glucose, Bld: 394 mg/dL — ABNORMAL HIGH (ref 65–99)
Potassium: 4.4 mmol/L (ref 3.5–5.1)
SODIUM: 139 mmol/L (ref 135–145)
Total Bilirubin: 0.8 mg/dL (ref 0.3–1.2)
Total Protein: 6.9 g/dL (ref 6.5–8.1)

## 2014-10-01 LAB — APTT: aPTT: 29 seconds (ref 24–36)

## 2014-10-01 LAB — MRSA PCR SCREENING: MRSA BY PCR: NEGATIVE

## 2014-10-01 LAB — BRAIN NATRIURETIC PEPTIDE: B Natriuretic Peptide: 1435 pg/mL — ABNORMAL HIGH (ref 0.0–100.0)

## 2014-10-01 LAB — TROPONIN I: Troponin I: 0.22 ng/mL — ABNORMAL HIGH (ref <0.031)

## 2014-10-01 MED ORDER — PREGABALIN 75 MG PO CAPS
75.0000 mg | ORAL_CAPSULE | Freq: Every day | ORAL | Status: DC
  Administered 2014-10-01 – 2014-10-04 (×4): 75 mg via ORAL
  Filled 2014-10-01 (×4): qty 1

## 2014-10-01 MED ORDER — SODIUM CHLORIDE 0.9 % IJ SOLN
3.0000 mL | Freq: Two times a day (BID) | INTRAMUSCULAR | Status: DC
  Administered 2014-10-01 – 2014-10-05 (×7): 3 mL via INTRAVENOUS

## 2014-10-01 MED ORDER — ACETAMINOPHEN 325 MG PO TABS: 650.0000 mg | ORAL_TABLET | Freq: Four times a day (QID) | ORAL | Status: DC | PRN

## 2014-10-01 MED ORDER — FAMOTIDINE 20 MG PO TABS
20.0000 mg | ORAL_TABLET | Freq: Every day | ORAL | Status: DC
  Filled 2014-10-01: qty 1

## 2014-10-01 MED ORDER — NITROGLYCERIN 2 % TD OINT
0.5000 [in_us] | TOPICAL_OINTMENT | Freq: Four times a day (QID) | TRANSDERMAL | Status: DC
  Administered 2014-10-01 – 2014-10-03 (×5): 0.5 [in_us] via TOPICAL
  Filled 2014-10-01 (×6): qty 1

## 2014-10-01 MED ORDER — SALINE SPRAY 0.65 % NA SOLN: 1.0000 | NASAL | Status: DC | PRN

## 2014-10-01 MED ORDER — INSULIN ASPART 100 UNIT/ML ~~LOC~~ SOLN
0.0000 [IU] | Freq: Three times a day (TID) | SUBCUTANEOUS | Status: DC
  Administered 2014-10-01: 15 [IU] via SUBCUTANEOUS
  Filled 2014-10-01: qty 15

## 2014-10-01 MED ORDER — EPOETIN ALFA 4000 UNIT/ML IJ SOLN
4000.0000 [IU] | INTRAMUSCULAR | Status: DC
  Administered 2014-10-01 – 2014-10-05 (×4): 4000 [IU] via INTRAVENOUS
  Filled 2014-10-01 (×3): qty 1

## 2014-10-01 MED ORDER — NADOLOL 40 MG PO TABS
40.0000 mg | ORAL_TABLET | Freq: Every day | ORAL | Status: DC
  Administered 2014-10-05: 40 mg via ORAL
  Filled 2014-10-01 (×3): qty 1

## 2014-10-01 MED ORDER — INSULIN ASPART 100 UNIT/ML ~~LOC~~ SOLN: 0.0000 [IU] | Freq: Three times a day (TID) | SUBCUTANEOUS | Status: DC

## 2014-10-01 MED ORDER — INSULIN GLARGINE 100 UNIT/ML ~~LOC~~ SOLN
60.0000 [IU] | Freq: Every day | SUBCUTANEOUS | Status: DC
  Administered 2014-10-01: 60 [IU] via SUBCUTANEOUS
  Filled 2014-10-01 (×3): qty 0.6

## 2014-10-01 MED ORDER — CALCIUM ACETATE (PHOS BINDER) 667 MG PO CAPS
667.0000 mg | ORAL_CAPSULE | Freq: Three times a day (TID) | ORAL | Status: DC
  Administered 2014-10-01 – 2014-10-05 (×9): 667 mg via ORAL
  Filled 2014-10-01 (×10): qty 1

## 2014-10-01 MED ORDER — PANTOPRAZOLE SODIUM 40 MG PO TBEC
40.0000 mg | DELAYED_RELEASE_TABLET | Freq: Every day | ORAL | Status: DC
  Administered 2014-10-03 – 2014-10-05 (×3): 40 mg via ORAL
  Filled 2014-10-01 (×4): qty 1

## 2014-10-01 MED ORDER — VENLAFAXINE HCL 37.5 MG PO TABS
37.5000 mg | ORAL_TABLET | Freq: Every day | ORAL | Status: DC
  Administered 2014-10-01 – 2014-10-04 (×4): 37.5 mg via ORAL
  Filled 2014-10-01 (×6): qty 1

## 2014-10-01 MED ORDER — TRAZODONE HCL 50 MG PO TABS
50.0000 mg | ORAL_TABLET | Freq: Every day | ORAL | Status: DC
  Administered 2014-10-01 – 2014-10-04 (×4): 50 mg via ORAL
  Filled 2014-10-01 (×4): qty 1

## 2014-10-01 MED ORDER — CALCIUM CARBONATE ANTACID 500 MG PO CHEW
1.0000 | CHEWABLE_TABLET | Freq: Three times a day (TID) | ORAL | Status: DC
  Administered 2014-10-01 – 2014-10-05 (×9): 200 mg via ORAL
  Filled 2014-10-01 (×11): qty 1

## 2014-10-01 MED ORDER — INFLUENZA VAC SPLIT QUAD 0.5 ML IM SUSY
0.5000 mL | PREFILLED_SYRINGE | INTRAMUSCULAR | Status: AC
  Administered 2014-10-02: 10:00:00 0.5 mL via INTRAMUSCULAR
  Filled 2014-10-01: qty 0.5

## 2014-10-01 MED ORDER — ASPIRIN 81 MG PO CHEW
324.0000 mg | CHEWABLE_TABLET | Freq: Once | ORAL | Status: AC
  Administered 2014-10-01: 324 mg via ORAL
  Filled 2014-10-01: qty 4

## 2014-10-01 MED ORDER — ACETAMINOPHEN 650 MG RE SUPP: 650.0000 mg | Freq: Four times a day (QID) | RECTAL | Status: DC | PRN

## 2014-10-01 MED ORDER — LEVOTHYROXINE SODIUM 75 MCG PO TABS
75.0000 ug | ORAL_TABLET | Freq: Every day | ORAL | Status: DC
Start: 2014-10-01 — End: 2014-10-05
  Administered 2014-10-03 – 2014-10-05 (×3): 75 ug via ORAL
  Filled 2014-10-01 (×4): qty 1

## 2014-10-01 MED ORDER — ATORVASTATIN CALCIUM 20 MG PO TABS
80.0000 mg | ORAL_TABLET | Freq: Every day | ORAL | Status: DC
  Administered 2014-10-01 – 2014-10-04 (×4): 80 mg via ORAL
  Filled 2014-10-01 (×4): qty 4

## 2014-10-01 MED ORDER — NEPRO/CARBSTEADY PO LIQD
237.0000 mL | Freq: Three times a day (TID) | ORAL | Status: DC
  Administered 2014-10-01 – 2014-10-05 (×11): 237 mL via ORAL

## 2014-10-01 MED ORDER — LORATADINE 10 MG PO TABS
10.0000 mg | ORAL_TABLET | Freq: Every day | ORAL | Status: DC
  Administered 2014-10-03 – 2014-10-05 (×3): 10 mg via ORAL
  Filled 2014-10-01 (×4): qty 1

## 2014-10-01 MED ORDER — FERROUS FUMARATE 325 (106 FE) MG PO TABS
1.0000 | ORAL_TABLET | Freq: Every day | ORAL | Status: DC
  Administered 2014-10-03 – 2014-10-05 (×3): 106 mg via ORAL
  Filled 2014-10-01 (×5): qty 1

## 2014-10-01 MED ORDER — CITALOPRAM HYDROBROMIDE 20 MG PO TABS
10.0000 mg | ORAL_TABLET | Freq: Every day | ORAL | Status: DC
  Administered 2014-10-03 – 2014-10-05 (×3): 10 mg via ORAL
  Filled 2014-10-01 (×4): qty 1

## 2014-10-01 MED ORDER — ROPINIROLE HCL 1 MG PO TABS
2.0000 mg | ORAL_TABLET | Freq: Every day | ORAL | Status: DC
  Administered 2014-10-01 – 2014-10-04 (×4): 2 mg via ORAL
  Filled 2014-10-01 (×4): qty 2

## 2014-10-01 MED ORDER — OXYCODONE-ACETAMINOPHEN 5-325 MG PO TABS
1.0000 | ORAL_TABLET | Freq: Three times a day (TID) | ORAL | Status: DC | PRN
  Administered 2014-10-05 (×2): 1 via ORAL
  Filled 2014-10-01 (×2): qty 1

## 2014-10-01 MED ORDER — DOCUSATE SODIUM 100 MG PO CAPS
100.0000 mg | ORAL_CAPSULE | Freq: Two times a day (BID) | ORAL | Status: DC
  Administered 2014-10-02 – 2014-10-05 (×2): 100 mg via ORAL
  Filled 2014-10-01 (×5): qty 1

## 2014-10-01 MED ORDER — TRIAMCINOLONE ACETONIDE 0.1 % EX CREA: 1.0000 "application " | TOPICAL_CREAM | Freq: Every day | CUTANEOUS | Status: DC | PRN

## 2014-10-01 MED ORDER — IPRATROPIUM-ALBUTEROL 0.5-2.5 (3) MG/3ML IN SOLN
3.0000 mL | Freq: Once | RESPIRATORY_TRACT | Status: AC
  Administered 2014-10-01: 3 mL via RESPIRATORY_TRACT
  Filled 2014-10-01: qty 3

## 2014-10-01 MED ORDER — BISACODYL 5 MG PO TBEC
5.0000 mg | DELAYED_RELEASE_TABLET | Freq: Every day | ORAL | Status: DC
  Administered 2014-10-01 – 2014-10-02 (×2): 5 mg via ORAL
  Filled 2014-10-01 (×2): qty 1

## 2014-10-01 MED ORDER — CEPHALEXIN 250 MG PO CAPS
250.0000 mg | ORAL_CAPSULE | Freq: Every day | ORAL | Status: DC
  Administered 2014-10-01 – 2014-10-04 (×4): 250 mg via ORAL
  Filled 2014-10-01 (×5): qty 1

## 2014-10-01 MED ORDER — PNEUMOCOCCAL VAC POLYVALENT 25 MCG/0.5ML IJ INJ
0.5000 mL | INJECTION | INTRAMUSCULAR | Status: AC
  Administered 2014-10-02: 0.5 mL via INTRAMUSCULAR
  Filled 2014-10-01: qty 0.5

## 2014-10-01 MED ORDER — VITAMIN D (ERGOCALCIFEROL) 1.25 MG (50000 UNIT) PO CAPS
50000.0000 [IU] | ORAL_CAPSULE | ORAL | Status: DC
  Administered 2014-10-05: 50000 [IU] via ORAL
  Filled 2014-10-01: qty 1

## 2014-10-01 MED ORDER — ISOSORBIDE MONONITRATE ER 60 MG PO TB24
60.0000 mg | ORAL_TABLET | Freq: Every day | ORAL | Status: DC
  Administered 2014-10-05: 60 mg via ORAL
  Filled 2014-10-01: qty 2
  Filled 2014-10-01: qty 1
  Filled 2014-10-01: qty 2

## 2014-10-01 MED ORDER — INSULIN ASPART 100 UNIT/ML ~~LOC~~ SOLN
0.0000 [IU] | Freq: Three times a day (TID) | SUBCUTANEOUS | Status: DC
  Administered 2014-10-01: 18:00:00 9 [IU] via SUBCUTANEOUS
  Administered 2014-10-02: 09:00:00 2 [IU] via SUBCUTANEOUS
  Filled 2014-10-01: qty 2
  Filled 2014-10-01: qty 9

## 2014-10-01 MED ORDER — AQUAPHOR EX OINT: 1.0000 "application " | TOPICAL_OINTMENT | CUTANEOUS | Status: DC | PRN

## 2014-10-01 MED ORDER — LIDOCAINE 5 % EX PTCH
1.0000 | MEDICATED_PATCH | CUTANEOUS | Status: DC
  Filled 2014-10-01 (×7): qty 1

## 2014-10-01 MED ORDER — TROLAMINE SALICYLATE 10 % EX CREA: 1.0000 "application " | TOPICAL_CREAM | CUTANEOUS | Status: DC

## 2014-10-01 MED ORDER — ONDANSETRON HCL 4 MG PO TABS
4.0000 mg | ORAL_TABLET | Freq: Four times a day (QID) | ORAL | Status: DC | PRN
  Administered 2014-10-05: 4 mg via ORAL
  Filled 2014-10-01: qty 1

## 2014-10-01 MED ORDER — ONDANSETRON HCL 4 MG/2ML IJ SOLN
4.0000 mg | Freq: Four times a day (QID) | INTRAMUSCULAR | Status: DC | PRN
Start: 2014-10-01 — End: 2014-10-05

## 2014-10-01 MED ORDER — ASPIRIN EC 81 MG PO TBEC
81.0000 mg | DELAYED_RELEASE_TABLET | Freq: Every day | ORAL | Status: DC
  Administered 2014-10-03 – 2014-10-05 (×3): 81 mg via ORAL
  Filled 2014-10-01 (×4): qty 1

## 2014-10-01 MED ORDER — HEPARIN SODIUM (PORCINE) 5000 UNIT/ML IJ SOLN
5000.0000 [IU] | Freq: Three times a day (TID) | INTRAMUSCULAR | Status: DC
  Administered 2014-10-01 – 2014-10-03 (×6): 5000 [IU] via SUBCUTANEOUS
  Filled 2014-10-01 (×5): qty 1

## 2014-10-01 MED ORDER — REPLENS VA GEL: 1.0000 "application " | VAGINAL | Status: DC | PRN

## 2014-10-01 MED ORDER — POLYETHYLENE GLYCOL 3350 17 G PO PACK
17.0000 g | PACK | ORAL | Status: DC
  Filled 2014-10-01: qty 1

## 2014-10-01 NOTE — Progress Notes (Signed)
PRE HD   10/01/14 0945  Report  Report Received From Annice Pih, RN  Vital Signs  Temp 97.6 F (36.4 C)  Temp Source Axillary  Pulse Rate 93  Pulse Rate Source Monitor  Resp (!) 24  BP (!) 148/72 mmHg  BP Location Right Arm  BP Method Automatic  Patient Position (if appropriate) Lying  Oxygen Therapy  SpO2 100 %  O2 Device Bi-PAP  Pain Assessment  Pain Assessment No/denies pain  Pain Score 0  Dialysis Weight  Weight 90.9 kg (200 lb 6.4 oz)  Type of Weight Pre-Dialysis  Time-Out for Hemodialysis  What Procedure? HEMODIALYSIS  Pt Identifiers(min of two) First/Last Name;MRN/Account#  Correct Site? Yes  Correct Side? Yes  Correct Procedure? Yes  Consents Verified? Yes  Rad Studies Available? N/A  Safety Precautions Reviewed? Yes  Machine Checks  Machine Number [id number]  UF/Alarm Test Passed  Conductivity: Meter 13.5  Conductivity: Machine  13.5  pH 7.4  Dialyzer Lot Number [id number]  Disposable Set Lot Number [id number]  Machine Temperature 98.6 F (37 C)  Immunologist and Audible Yes  Blood Lines Intact and Secured Yes  Pre Treatment Patient Checks  Vascular access used during treatment Graft  Hemodialysis Consent Verified Yes  Hemodialysis Standing Orders Initiated Yes  ECG (Telemetry) Monitor On Yes  Prime Ordered Normal Saline  Length of  DialysisTreatment -hour(s) 3 Hour(s)  Dialysis Treatment Comments HD TREATMENT INTITATED. GOAL TO REMOVE 3L OVER 3 HOURS 15G NEEDLES INSERTED WITHOUT DIFFICULTY.  PT ALERT AND VS STABLE. .  Dialyzer Optiflux 180 NR  Dialysate 2K, 2.5 Ca  Dialysis Anticoagulant None  Dialysate Flow Ordered 800  Blood Flow Rate Ordered 400 mL/min  Ultrafiltration Goal 3 Liters  Pre Treatment Labs Hepatitis B Surface Antigen;Renal panel;CBC  Dialysis Blood Pressure Support Ordered Normal Saline  Fistula / Graft Left  Arteriovenous fistula  No Placement Date or Time found.   Orientation: Left  Access Location: (c)   Access Type: Arteriovenous  fistula  Site Condition No complications  Fistula / Graft Assessment Present;Thrill;Bruit  Status Accessed  Needle Size 15  Drainage Description None

## 2014-10-01 NOTE — ED Notes (Signed)
Resting quietly with eyes closed.

## 2014-10-01 NOTE — ED Notes (Signed)
MD at bedside, for admission. 

## 2014-10-01 NOTE — Progress Notes (Signed)
HD END 

## 2014-10-01 NOTE — ED Provider Notes (Signed)
[facility] Emergency Department Provider Note  ____________________________________________  Time seen: Approximately 4:28 AM  I have reviewed the triage vital signs and the nursing notes.   HISTORY  Chief Complaint Shortness of Breath and Chest Pain  Caveat-history of present illness review of systems limited secondary to the patient's respiratory distress.  HPI KODIE PICK is a 74 y.o. female with end-stage renal disease on dialysis, last dialyzed on Saturday, COPD, diabetes, coronary artery disease, hypertension who presents for evaluation of shortness of breath. Patient reports that she has had several days of shortness of breath as well as nonproductive cough. On EMS arrival she was in severe respiratory distress and BiPAP was initiated. She received 125 mg Solu-Medrol via EMS. She is complaining of chest tightness. No fevers or chills. She has had some diarrhea. Currently her shortness of breath is severe. It is worse with lying flat.   Past Medical History  Diagnosis Date  . Chronic kidney disease   . GERD (gastroesophageal reflux disease)   . Carotid artery stenosis   . Coronary artery disease   . Hypothyroidism   . Diabetes mellitus without complication   . Depression   . Arthritis   . Kidney failure   . Liver failure   . MI (myocardial infarction)   . HTN (hypertension)   . Broken leg     R    There are no active problems to display for this patient.   Past Surgical History  Procedure Laterality Date  . Av fistula placement Left   . Cholecystectomy    . Peripheral vascular catheterization N/A 06/11/2014    Procedure: A/V Shuntogram/Fistulagram;  Surgeon: Annice Needy, MD;  Location: [facility] INVASIVE CV LAB;  Service: Cardiovascular;  Laterality: N/A;  . Peripheral vascular catheterization Left 06/11/2014    Procedure: A/V Shunt Intervention;  Surgeon: Annice Needy, MD;  Location: [facility] INVASIVE CV LAB;  Service: Cardiovascular;  Laterality:  Left;  . Open reduction internal fixation (orif) distal radial fracture Left 08/09/2014    Procedure: OPEN REDUCTION INTERNAL FIXATION (ORIF) DISTAL RADIAL FRACTURE;  Surgeon: Kennedy Bucker, MD;  Location: [facility] ORS;  Service: Orthopedics;  Laterality: Left;    Current Outpatient Rx  Name  Route  Sig  Dispense  Refill  . aspirin EC 81 MG tablet   Oral   Take by mouth daily.         Marland Kitchen atorvastatin (LIPITOR) 20 MG tablet   Oral   Take 80 mg by mouth at bedtime.          . bisacodyl (DULCOLAX) 5 MG EC tablet   Oral   Take 5 mg by mouth at bedtime.          . calcium acetate (PHOSLO) 667 MG capsule   Oral   Take 667 mg by mouth 3 (three) times daily with meals.         . calcium carbonate (TUMS - DOSED IN MG ELEMENTAL CALCIUM) 500 MG chewable tablet   Oral   Chew 1 tablet by mouth 3 (three) times daily.         . cephALEXin (KEFLEX) 250 MG capsule   Oral   Take 250 mg by mouth at bedtime.          . cetirizine (ZYRTEC) 5 MG tablet   Oral   Take 5 mg by mouth at bedtime.          . citalopram (CELEXA) 10 MG tablet   Oral  Take 10 mg by mouth daily.         . ergocalciferol (VITAMIN D2) 50000 UNITS capsule   Oral   Take 50,000 Units by mouth once a week.         . famotidine (PEPCID) 20 MG tablet   Oral   Take 20 mg by mouth daily with breakfast.          . ferrous fumarate (HEMOCYTE - 106 MG FE) 325 (106 FE) MG TABS tablet   Oral   Take 1 tablet by mouth.         . hydrOXYzine (ATARAX/VISTARIL) 10 MG tablet   Oral   Take 10 mg by mouth 3 (three) times daily as needed.         . insulin glargine (LANTUS) 100 UNIT/ML injection   Subcutaneous   Inject 90 Units into the skin every morning.          . insulin glargine (LANTUS) 100 UNIT/ML injection   Subcutaneous   Inject 50 Units into the skin daily before supper.         . insulin NPH Human (HUMULIN N,NOVOLIN N) 100 UNIT/ML injection   Subcutaneous   Inject 15 Units into the skin 3  (three) times daily before meals.          . isosorbide mononitrate (IMDUR) 60 MG 24 hr tablet   Oral   Take 60 mg by mouth daily before breakfast.         . levothyroxine (SYNTHROID, LEVOTHROID) 100 MCG tablet   Oral   Take 75 mcg by mouth daily before breakfast.          . lidocaine (LIDODERM) 5 %   Transdermal   Place 1 patch onto the skin daily. Remove & Discard patch within 12 hours or as directed by MD         . mineral oil-hydrophilic petrolatum (AQUAPHOR) ointment   Topical   Apply 1 application topically as needed for dry skin.         . nadolol (CORGARD) 40 MG tablet   Oral   Take 40 mg by mouth daily.         . Nutritional Supplements (FEEDING SUPPLEMENT, NEPRO CARB STEADY,) LIQD   Oral   Take 237 mLs by mouth 3 (three) times daily between meals.         Marland Kitchen omeprazole (PRILOSEC) 20 MG capsule   Oral   Take 20 mg by mouth daily.         Marland Kitchen oxyCODONE-acetaminophen (ROXICET) 5-325 MG per tablet   Oral   Take 1 tablet by mouth every 8 (eight) hours as needed for moderate pain or severe pain (Do not drive or operate heavy machinery while taking as can cause drowsiness.).   9 tablet   0   . pantoprazole (PROTONIX) 40 MG tablet   Oral   Take 40 mg by mouth 2 (two) times daily.         . polyethylene glycol (MIRALAX / GLYCOLAX) packet   Oral   Take 17 g by mouth every other day. Mix in 4 to 8 ounces of fluid before taking.         . pregabalin (LYRICA) 50 MG capsule   Oral   Take 75 mg by mouth at bedtime.          Marland Kitchen rOPINIRole (REQUIP) 2 MG tablet   Oral   Take 2 mg by mouth at bedtime.         Marland Kitchen  selenium sulfide (SELSUN) 2.5 % shampoo   Topical   Apply 1 application topically every 7 (seven) weeks.         . sodium chloride (OCEAN) 0.65 % SOLN nasal spray   Each Nare   Place 1 spray into both nostrils as needed for congestion.         . traZODone (DESYREL) 50 MG tablet   Oral   Take 50 mg by mouth at bedtime.         .  triamcinolone cream (KENALOG) 0.1 %   Topical   Apply 1 application topically daily as needed.         . trolamine salicylate (ASPERCREME) 10 % cream   Topical   Apply 1 application topically every 3 (three) days.         . Vaginal Lubricant (REPLENS) GEL   Vaginal   Place 1 application vaginally as needed.         . venlafaxine (EFFEXOR) 37.5 MG tablet   Oral   Take 37.5 mg by mouth at bedtime.            Allergies Sulfa antibiotics  No family history on file.  Social History Social History  Substance Use Topics  . Smoking status: Never Smoker   . Smokeless tobacco: None  . Alcohol Use: No    Review of Systems Constitutional: No fever/chills Eyes: No visual changes. ENT: No sore throat. Cardiovascular: + chest pain. Respiratory: +shortness of breath. Gastrointestinal: No abdominal pain.  No nausea, no vomiting.  + diarrhea.  No constipation. Genitourinary: Negative for dysuria. Musculoskeletal: Negative for back pain. Skin: Negative for rash. Neurological: Negative for headaches, focal weakness or numbness.  10-point ROS otherwise negative.   Caveat-history of present illness review of systems limited secondary to the patient's respiratory distress. ____________________________________________   PHYSICAL EXAM:  Filed Vitals:   10/01/14 0430 10/01/14 0438 10/01/14 0500 10/01/14 0530  BP: 161/81  142/82 154/72  Pulse: 112  110 111  Resp: 30  25 22   Weight: 220 lb (99.791 kg)     SpO2: 100% 100% 100% 100%     Constitutional: Alert and oriented. In moderate to severe respiratory distress, speaking in short sentences over BiPAP. Eyes: Conjunctivae are normal. PERRL. EOMI. Head: Atraumatic. Nose: No congestion/rhinnorhea. Mouth/Throat: Mucous membranes are moist.  Oropharynx non-erythematous. Neck: No stridor.   Cardiovascular: Tachycardic rate, regular rhythm. Grossly normal heart sounds.  Good peripheral circulation. Respiratory: tachypnea,  increased work of breathing, x-ray wheeze with crackles.  Gastrointestinal: Soft and nontender. No distention. No abdominal bruits. No CVA tenderness. Genitourinary:  Deferred Musculoskeletal: Trace edema bilateral lower extremities, no tenderness or deformity.  Neurologic:  Normal speech and language. No gross focal neurologic deficits are appreciated. Skin:  Skin is warm, dry and intact. No rash noted. Psychiatric: Mood and affect are normal. Speech and behavior are normal.  ____________________________________________   LABS (all labs ordered are listed, but only abnormal results are displayed)  Labs Reviewed  CBC WITH DIFFERENTIAL/PLATELET - Abnormal; Notable for the following:    WBC 11.9 (*)    Hemoglobin 11.3 (*)    MCHC 31.0 (*)    RDW 22.3 (*)    Platelets 131 (*)    Neutro Abs 9.8 (*)    Lymphs Abs 0.8 (*)    Monocytes Absolute 1.1 (*)    All other components within normal limits  COMPREHENSIVE METABOLIC PANEL - Abnormal; Notable for the following:    Glucose, Bld 394 (*)  BUN 52 (*)    Creatinine, Ser 4.07 (*)    AST 45 (*)    Alkaline Phosphatase 146 (*)    GFR calc non Af Amer 10 (*)    GFR calc Af Amer 12 (*)    All other components within normal limits  TROPONIN I - Abnormal; Notable for the following:    Troponin I 0.22 (*)    All other components within normal limits  CULTURE, BLOOD (ROUTINE X 2)  CULTURE, BLOOD (ROUTINE X 2)  PROTIME-INR  APTT  BRAIN NATRIURETIC PEPTIDE  BLOOD GAS, ARTERIAL   ____________________________________________  EKG  ED ECG REPORT I, Gayla Doss, the attending physician, personally viewed and interpreted this ECG.   Date: 10/01/2014  EKG Time: 04:24  Rate: 109  Rhythm: sinus tachycardia with premature supraventricular complexes and premature ventricular complexes or fusion complexes.  Axis: Normal  Intervals:none  ST&T Change: No acute ST elevation. Q waves in 3  aVF  ____________________________________________  RADIOLOGY  CXR  The heart is enlarged, similar to prior. There is vascular congestion and perihilar edema. Probable bilateral pleural effusions. Retrocardiac opacity, likely atelectasis. No pneumothorax.  IMPRESSION: Findings suggest CHF. ____________________________________________   PROCEDURES  Procedure(s) performed: None  Critical Care performed: Yes, see critical care note(s). Total critical care time spent 30 minutes.  ____________________________________________   INITIAL IMPRESSION / ASSESSMENT AND PLAN / ED COURSE  Pertinent labs & imaging results that were available during my care of the patient were reviewed by me and considered in my medical decision making (see chart for details).  CARLO LORSON is a 74 y.o. female with end-stage renal disease on dialysis, last dialyzed on Saturday, COPD, diabetes, coronary artery disease, hypertension who presents for evaluation of shortness of breath. On exam, she is in moderate to severe respiratory distress with diffuse expiratory wheeze and rales/crackles. Exam and x-ray consistent with volume overload/CHF. She is improving on BiPAP. Labs notable for mild leukocytosis, mild anemia. Elevated BUN and creatinine, increased from prior. Troponin elevated 0.22, we'll give aspirin. Normal coags. Case discussed with Dr. Deneise Lever of nephrology who will arrange for emergent dialysis today. Case discussed with Dr. Sheryle Hail, hospitalist for admission at 6 AM. ____________________________________________   FINAL CLINICAL IMPRESSION(S) / ED DIAGNOSES  Final diagnoses:  Acute respiratory failure with hypoxia  Congestive heart failure, unspecified congestive heart failure chronicity, unspecified congestive heart failure type      Gayla Doss, MD 10/01/14 [phone]

## 2014-10-01 NOTE — Progress Notes (Signed)
[facility] [place] at [facility]   PATIENT NAME: Savannah Key    MR#:  [id number]  DATE OF BIRTH:  September 20, 1940  SUBJECTIVE:  CHIEF COMPLAINT:   Chief Complaint  Patient presents with  . Shortness of Breath  . Chest Pain   Came with SOB and pulmonary edema, she is on HD as out pt.   HD done today, she feels better.  REVIEW OF SYSTEMS:  CONSTITUTIONAL: No fever, fatigue or weakness.  EYES: No blurred or double vision.  EARS, NOSE, AND THROAT: No tinnitus or ear pain.  RESPIRATORY: No cough,positive for shortness of breath, wheezing or hemoptysis.  CARDIOVASCULAR: No chest pain, orthopnea, edema.  GASTROINTESTINAL: No nausea, vomiting, diarrhea or abdominal pain.  GENITOURINARY: No dysuria, hematuria.  ENDOCRINE: No polyuria, nocturia,  HEMATOLOGY: No anemia, easy bruising or bleeding SKIN: No rash or lesion. MUSCULOSKELETAL: No joint pain or arthritis.   NEUROLOGIC: No tingling, numbness, weakness.  PSYCHIATRY: No anxiety or depression.   ROS  DRUG ALLERGIES:   Allergies  Allergen Reactions  . Sulfa Antibiotics Other (See Comments)    caused bloody urine    VITALS:  Blood pressure 119/61, pulse 102, temperature 98.7 F (37.1 C), temperature source Oral, resp. rate 24, height 5' (1.524 m), weight 87.5 kg (192 lb 14.4 oz), SpO2 98 %.  PHYSICAL EXAMINATION:  GENERAL:  74 y.o.-year-old patient lying in the bed with no acute distress.  EYES: Pupils equal, round, reactive to light and accommodation. No scleral icterus. Extraocular muscles intact.  HEENT: Head atraumatic, normocephalic. Oropharynx and nasopharynx clear.  NECK:  Supple, no jugular venous distention. No thyroid enlargement, no tenderness.  LUNGS: Normal breath sounds bilaterally, no wheezing, mild crepitation. No use of accessory muscles of respiration. Uses supplemental oxygen. CARDIOVASCULAR: S1, S2 normal. No murmurs, rubs, or gallops.  ABDOMEN: Soft, nontender, nondistended.  Bowel sounds present. No organomegaly or mass.  EXTREMITIES: mild pedal edema, no cyanosis, or clubbing.  NEUROLOGIC: Cranial nerves II through XII are intact. Muscle strength 5/5 in all extremities. Sensation intact. Gait not checked.  PSYCHIATRIC: The patient is alert and oriented x 3.  SKIN: No obvious rash, lesion, or ulcer.   Physical Exam LABORATORY PANEL:   CBC  Recent Labs Lab 10/01/14 0439  WBC 11.9*  HGB 11.3*  HCT 36.3  PLT 131*   ------------------------------------------------------------------------------------------------------------------  Chemistries   Recent Labs Lab 10/01/14 0439  NA 139  K 4.4  CL 104  CO2 24  GLUCOSE 394*  BUN 52*  CREATININE 4.07*  CALCIUM 9.8  AST 45*  ALT 29  ALKPHOS 146*  BILITOT 0.8   ------------------------------------------------------------------------------------------------------------------  Cardiac Enzymes  Recent Labs Lab 10/01/14 0439  TROPONINI 0.22*   ------------------------------------------------------------------------------------------------------------------  RADIOLOGY:  Dg Chest Portable 1 View  10/01/2014   CLINICAL DATA:  Shortness of breath and chest pain, symptoms for 6 hours.  EXAM: PORTABLE CHEST - 1 VIEW  COMPARISON:  08/11/2014  FINDINGS: The heart is enlarged, similar to prior. There is vascular congestion and perihilar edema. Probable bilateral pleural effusions. Retrocardiac opacity, likely atelectasis. No pneumothorax.  IMPRESSION: Findings suggest CHF.   Electronically Signed   By: Rubye Oaks M.D.   On: 10/01/2014 05:56    ASSESSMENT AND PLAN:   This 74 year old Caucasian female with end-stage renal disease admitted for respiratory distress secondary to fluid overload. 1. Pulmonary edema:    Appreciated nephrology help- HD done.   2. Respiratory failure with hypercapnia and hypoxemia: Respiratory acidosis;  Initially required BiPAP. Now  on Supplemental oxygen as needed 3.  Chest pain: likely secondary to respiratory distress and ESRD. 4. Diabetes mellitus type 2: Continue basal insulin (the patient takes twice a day Lantus). Started on sliding scale insulin while hospitalized 5. End-stage renal disease: Continue PhosLo 6. Hypothyroidism: Continue Synthroid 7. Depression: Continue venlafaxine, Celexa and trazodone 8. DVT prophylaxis: Heparin 9. GI prophylaxis: Pantoprazole  As respi status improved- Transfer to floor today.  All the records are reviewed and case discussed with Care Management/Social Workerr. Management plans discussed with the patient, family and they are in agreement.  CODE STATUS: full  TOTAL TIME TAKING CARE OF THIS PATIENT: 35 minutes.   POSSIBLE D/C IN 1-2 DAYS, DEPENDING ON CLINICAL CONDITION.   Altamese Dilling M.D on 10/01/2014   Between 7am to 6pm - Pager - [phone]  After 6pm go to [email or web address] - password [email or web address] [facility]  [place] [facility]  Office  [phone]  CC: Primary care physician; Bobbye Morton, MD

## 2014-10-01 NOTE — Progress Notes (Signed)
POST HD   10/01/14 1300  Neurological  Level of Consciousness Alert  Orientation Level Oriented X4  Respiratory  Respiratory Pattern Regular;Labored  Chest Assessment Chest expansion symmetrical  Bilateral Breath Sounds Diminished;Clear  R Lower Breath Sounds Fine crackles  L Lower Breath Sounds Fine crackles  Cardiac  Pulse Regular  ECG Monitor Yes  Cardiac Rhythm NSR  Vascular  Edema Generalized;Right lower extremity;Left lower extremity  Generalized Edema Other (Comment) (NON PITTING)  RLE Edema +1  LLE Edema +1  Integumentary  Integumentary (WDL) X  Skin Color Appropriate for ethnicity  Skin Condition Dry;Flaky  Skin Integrity Ecchymosis  Ecchymosis Location Arm  Ecchymosis Location Orientation Left;Upper  Musculoskeletal  Musculoskeletal (WDL) X  Generalized Weakness Yes  Gastrointestinal  Bowel Sounds Assessment Active  GU Assessment  Genitourinary (WDL) X  Genitourinary Symptoms Other (Comment) (HD)  Psychosocial  Psychosocial (WDL) WDL

## 2014-10-01 NOTE — ED Notes (Signed)
Patient to rm 15 from home via EMS for shortness of breath and chest pain that started last pm at approximately 9pm.  Patient reports being dialysis patient and last had dialysis on Friday.

## 2014-10-01 NOTE — Care Management Note (Signed)
Case Management Note  Patient Details  Name: Savannah Key MRN: [id number] Date of Birth: 11/29/1940  Subjective/Objective:  ESRD  At [facility] [employer]              Action/Plan:   Expected Discharge Date:                  Expected Discharge Plan:     In-House Referral:     Discharge planning Services     Post Acute Care Choice:    Choice offered to:     DME Arranged:    DME Agency:     HH Arranged:    HH Agency:     Status of Service:     Medicare Important Message Given:    Date Medicare IM Given:    Medicare IM give by:    Date Additional Medicare IM Given:    Additional Medicare Important Message give by:     If discussed at Long Length of Stay Meetings, dates discussed:    Additional Comments:  Marily Memos, RN 10/01/2014, 12:18 PM

## 2014-10-01 NOTE — Progress Notes (Signed)
Respiratory therapy removed BiPap and placed on 3 Liter nasal cannula. O2 sat is 100%.

## 2014-10-01 NOTE — Progress Notes (Signed)
Subjective:   Patient was admitted earlier in the morning for difficulty breathing and chest pain. She reported productive cough. No fevers, nausea or vomiting. She was hypoxic in the emergency room therefore was placed on BIPAP Nephrology team was requested to evaluate her for urgent dialysis Urgent dialysis was arranged. Patient was seen during dialysis treatment in the ICU. She is currently on BiPAP and not able to answer many questions.  Objective:  Vital signs in last 24 hours:  Temp:  [97.6 F (36.4 C)-97.8 F (36.6 C)] 97.6 F (36.4 C) (09/12 0945) Pulse Rate:  [87-117] 87 (09/12 1130) Resp:  [18-30] 21 (09/12 1130) BP: (106-161)/(58-108) 133/72 mmHg (09/12 1130) SpO2:  [70 %-100 %] 100 % (09/12 0945) FiO2 (%):  [50 %-80 %] 50 % (09/12 0815) Weight:  [90.9 kg (200 lb 6.4 oz)-99.791 kg (220 lb)] 90.9 kg (200 lb 6.4 oz) (09/12 0945)  Weight change:  Filed Weights   10/01/14 0430 10/01/14 0900 10/01/14 0945  Weight: 99.791 kg (220 lb) 99.7 kg (219 lb 12.8 oz) 90.9 kg (200 lb 6.4 oz)    Intake/Output:   No intake or output data in the 24 hours ending 10/01/14 1146   Physical Exam: General:  laying in the bed, anxious, no distress   HEENT  BiPAP mask in place   Neck  supple   Pulm/lungs  bilateral diffuse crackles   CVS/Heart  no rub or gallop   Abdomen:   soft, nontender   Extremities:  1+ pitting edema bilaterally   Neurologic:  alert, able to answer questions by nodding yes/no   Skin:  some erythema over lower extremities   Access:        Basic Metabolic Panel:   Recent Labs Lab 10/01/14 0439  NA 139  K 4.4  CL 104  CO2 24  GLUCOSE 394*  BUN 52*  CREATININE 4.07*  CALCIUM 9.8     CBC:  Recent Labs Lab 10/01/14 0439  WBC 11.9*  NEUTROABS 9.8*  HGB 11.3*  HCT 36.3  MCV 84.3  PLT 131*      Microbiology:  Recent Results (from the past 720 hour(s))  Blood culture (routine x 2)     Status: None (Preliminary result)   Collection Time:  10/01/14  4:40 AM  Result Value Ref Range Status   Specimen Description BLOOD RIGHT THUMB  Final   Special Requests BOTTLES DRAWN AEROBIC AND ANAEROBIC  Final   Culture NO GROWTH < 12 HOURS  Final   Report Status PENDING  Incomplete  Blood culture (routine x 2)     Status: None (Preliminary result)   Collection Time: 10/01/14  4:40 AM  Result Value Ref Range Status   Specimen Description BLOOD RIGHT HAND  Final   Special Requests BOTTLES DRAWN AEROBIC AND ANAEROBIC  Final   Culture NO GROWTH < 12 HOURS  Final   Report Status PENDING  Incomplete  MRSA PCR Screening     Status: None   Collection Time: 10/01/14  8:47 AM  Result Value Ref Range Status   MRSA by PCR NEGATIVE NEGATIVE Final    Comment:        The GeneXpert MRSA Assay (FDA approved for NASAL specimens only), is one component of a comprehensive MRSA colonization surveillance program. It is not intended to diagnose MRSA infection nor to guide or monitor treatment for MRSA infections.     Coagulation Studies:  Recent Labs  10/01/14 0439  LABPROT 14.8  INR 1.14  Urinalysis: No results for input(s): COLORURINE, LABSPEC, PHURINE, GLUCOSEU, HGBUR, BILIRUBINUR, KETONESUR, PROTEINUR, UROBILINOGEN, NITRITE, LEUKOCYTESUR in the last 72 hours.  Invalid input(s): APPERANCEUR    Imaging: Dg Chest Portable 1 View  10/01/2014   CLINICAL DATA:  Shortness of breath and chest pain, symptoms for 6 hours.  EXAM: PORTABLE CHEST - 1 VIEW  COMPARISON:  08/11/2014  FINDINGS: The heart is enlarged, similar to prior. There is vascular congestion and perihilar edema. Probable bilateral pleural effusions. Retrocardiac opacity, likely atelectasis. No pneumothorax.  IMPRESSION: Findings suggest CHF.   Electronically Signed   By: Rubye Oaks M.D.   On: 10/01/2014 05:56     Medications:     . aspirin EC  81 mg Oral Daily  . atorvastatin  80 mg Oral QHS  . bisacodyl  5 mg Oral QHS  . calcium acetate  667 mg Oral TID WC   . calcium carbonate  1 tablet Oral TID  . cephALEXin  250 mg Oral QHS  . citalopram  10 mg Oral Daily  . docusate sodium  100 mg Oral BID  . epoetin (EPOGEN/PROCRIT) injection  4,000 Units Intravenous Q M,W,F-HD  . famotidine  20 mg Oral Q breakfast  . feeding supplement (NEPRO CARB STEADY)  237 mL Oral TID BM  . ferrous fumarate  1 tablet Oral Daily  . heparin  5,000 Units Subcutaneous 3 times per day  . insulin aspart  0-20 Units Subcutaneous TID WC  . insulin glargine  60 Units Subcutaneous QHS  . isosorbide mononitrate  60 mg Oral QAC breakfast  . levothyroxine  75 mcg Oral QAC breakfast  . lidocaine  1 patch Transdermal Q24H  . loratadine  10 mg Oral Daily  . nadolol  40 mg Oral Daily  . nitroGLYCERIN  0.5 inch Topical 4 times per day  . pantoprazole  40 mg Oral Daily  . [START ON 10/02/2014] polyethylene glycol  17 g Oral QODAY  . pregabalin  75 mg Oral QHS  . rOPINIRole  2 mg Oral QHS  . sodium chloride  3 mL Intravenous Q12H  . traZODone  50 mg Oral QHS  . trolamine salicylate  1 application Topical Q3 days  . venlafaxine  37.5 mg Oral QHS  . [START ON 10/05/2014] Vitamin D (Ergocalciferol)  50,000 Units Oral Weekly   acetaminophen **OR** acetaminophen, mineral oil-hydrophilic petrolatum, ondansetron **OR** ondansetron (ZOFRAN) IV, oxyCODONE-acetaminophen, REPLENS, sodium chloride, triamcinolone cream  Assessment/ Plan:  74 y.o. female with end-stage renal disease, carotid stenosis, coronary artery disease, diabetes type 2, history of myocardial infarction, hypertension, peripheral vascular disease, admitted for acute pulmonary edema  1. End-stage renal disease. [facility]. Followed by [facility] 2. [facility] 3. [facility] 4. Acute pulmonary edema  Plan: Urgent hemodialysis done in the ICU. Goal of fluid removal about 3000 cc as tolerated We will reassess for need of dialysis on a daily basis    LOS: 0 Lanetta Figuero 9/12/201611:46 AM

## 2014-10-01 NOTE — Progress Notes (Signed)
POST HD   10/01/14 1315  Report  Report Received From [facility], RN  Vital Signs  Temp 97.8 F (36.6 C)  Temp Source Oral  Pulse Rate 90  Pulse Rate Source Monitor  Resp (!) 24  BP 122/72 mmHg  BP Location Right Arm  BP Method Automatic  Patient Position (if appropriate) Lying  Dialysis Weight  Weight 87.5 kg (192 lb 14.4 oz)  Type of Weight Post-Dialysis  During Hemodialysis Assessment  Intra-Hemodialysis Comments HD TREATMENT END. BLOOD RETURNED AND 15G NEEDLES REMOVED PER POLICY. PT ALERT VS STABLE.  Post-Hemodialysis Assessment  Rinseback Volume (mL) 250 mL  Dialyzer Clearance Heavily streaked  Duration of HD Treatment -hour(s) 3 hour(s)  Hemodialysis Intake (mL) 500 mL  UF Total -Machine (mL) 5183 mL  Net UF (mL) 4683 mL  Tolerated HD Treatment Yes  Post-Hemodialysis Comments HD TREATMENT END. BLOOD RETURNED AND 15G NEEDLES REMOVED PER POLICY. PT ALERT VS STABLE.  AVG/AVF Arterial Site Held (minutes) 10 minutes  AVG/AVF Venous Site Held (minutes) 10 minutes  Education / Care Plan  Hemodialysis Education Provided Yes  Documented Education in Clinical Pathway Yes  Fistula / Graft Left  Arteriovenous fistula  No Placement Date or Time found.   Orientation: Left  Access Location: (c)   Access Type: Arteriovenous fistula  Site Condition No complications  Fistula / Graft Assessment Present;Thrill;Bruit  Status Deaccessed  Needle Size 15  Drainage Description None

## 2014-10-01 NOTE — Progress Notes (Signed)
PRE HD   10/01/14 0915  Neurological  Level of Consciousness Alert  Orientation Level Oriented X4  Respiratory  Respiratory Pattern Labored;Tachypnea  Chest Assessment Chest expansion symmetrical  Cardiac  Pulse Regular  ECG Monitor Yes  Cardiac Rhythm NSR  Vascular  Edema Generalized;Right lower extremity;Left lower extremity  Generalized Edema Other (Comment) (non pitting)  RLE Edema +2  LLE Edema +2  Integumentary  Integumentary (WDL) X  Skin Color Appropriate for ethnicity  Skin Condition Dry;Flaky  Skin Integrity Ecchymosis  Ecchymosis Location Arm  Ecchymosis Location Orientation Left;Upper  Musculoskeletal  Musculoskeletal (WDL) X  Generalized Weakness Yes  Assistive Device None  Gastrointestinal  Bowel Sounds Assessment Active  GU Assessment  Genitourinary (WDL) X  Genitourinary Symptoms Other (Comment) (HD)  Psychosocial  Psychosocial (WDL) WDL

## 2014-10-01 NOTE — H&P (Addendum)
Savannah Key is an 74 y.o. female.   Chief Complaint: Respiratory distress HPI: The patient with past medical history of end-stage renal disease presents to the emergency department complaining of difficulty breathing and chest pain. The patient states that her chest began to hurt approximately 8 hours prior to arrival. She states that she took her "chest pain medicine" without relief. She also states that she has had a cough productive of phlegm but denies fevers, nausea or vomiting. In the emergency department the patient was found to be hypoxic and required BiPAP. Due to chest pain she was also given 325 mg of aspirin. CXR showed fluid overload which prompted emergency department staff to call for admission.  Past Medical History  Diagnosis Date  . Chronic kidney disease   . GERD (gastroesophageal reflux disease)   . Carotid artery stenosis   . Coronary artery disease   . Hypothyroidism   . Diabetes mellitus without complication   . Depression   . Arthritis   . Kidney failure   . Liver failure   . MI (myocardial infarction)   . HTN (hypertension)   . Broken leg     R    Past Surgical History  Procedure Laterality Date  . Av fistula placement Left   . Cholecystectomy    . Peripheral vascular catheterization N/A 06/11/2014    Procedure: A/V Shuntogram/Fistulagram;  Surgeon: Algernon Huxley, MD;  Location: [place] [facility];  Service: Cardiovascular;  Laterality: N/A;  . Peripheral vascular catheterization Left 06/11/2014    Procedure: A/V Shunt Intervention;  Surgeon: Algernon Huxley, MD;  Location: [place] [facility];  Service: Cardiovascular;  Laterality: Left;  . Open reduction internal fixation (orif) distal radial fracture Left 08/09/2014    Procedure: OPEN REDUCTION INTERNAL FIXATION (ORIF) DISTAL RADIAL FRACTURE;  Surgeon: Hessie Knows, MD;  Location: [facility];  Service: Orthopedics;  Laterality: Left;    No family history on file. Difficult to understand due to respiratory  distress Social History:  reports that she has never smoked. She does not have any smokeless tobacco history on file. She reports that she does not drink alcohol or use illicit drugs.  Allergies:  Allergies  Allergen Reactions  . Sulfa Antibiotics Other (See Comments)    caused bloody urine    Prior to Admission medications   Medication Sig Start Date End Date Taking? Authorizing Provider  aspirin EC 81 MG tablet Take by mouth daily.    Historical Provider, MD  atorvastatin (LIPITOR) 20 MG tablet Take 80 mg by mouth at bedtime.     Historical Provider, MD  bisacodyl (DULCOLAX) 5 MG EC tablet Take 5 mg by mouth at bedtime.     Historical Provider, MD  calcium acetate (PHOSLO) 667 MG capsule Take 667 mg by mouth 3 (three) times daily with meals.    Historical Provider, MD  calcium carbonate (TUMS - DOSED IN MG ELEMENTAL CALCIUM) 500 MG chewable tablet Chew 1 tablet by mouth 3 (three) times daily.    Historical Provider, MD  cephALEXin (KEFLEX) 250 MG capsule Take 250 mg by mouth at bedtime.     Historical Provider, MD  cetirizine (ZYRTEC) 5 MG tablet Take 5 mg by mouth at bedtime.     Historical Provider, MD  citalopram (CELEXA) 10 MG tablet Take 10 mg by mouth daily.    Historical Provider, MD  ergocalciferol (VITAMIN D2) 50000 UNITS capsule Take 50,000 Units by mouth once a week.    Historical Provider, MD  famotidine (PEPCID) 20 MG tablet Take 20 mg by mouth daily with breakfast.     Historical Provider, MD  ferrous fumarate (HEMOCYTE - 106 MG FE) 325 (106 FE) MG TABS tablet Take 1 tablet by mouth.    Historical Provider, MD  hydrOXYzine (ATARAX/VISTARIL) 10 MG tablet Take 10 mg by mouth 3 (three) times daily as needed.    Historical Provider, MD  insulin glargine (LANTUS) 100 UNIT/ML injection Inject 90 Units into the skin every morning.     Historical Provider, MD  insulin glargine (LANTUS) 100 UNIT/ML injection Inject 50 Units into the skin daily before supper.    Historical Provider, MD   insulin NPH Human (HUMULIN N,NOVOLIN N) 100 UNIT/ML injection Inject 15 Units into the skin 3 (three) times daily before meals.     Historical Provider, MD  isosorbide mononitrate (IMDUR) 60 MG 24 hr tablet Take 60 mg by mouth daily before breakfast.    Historical Provider, MD  levothyroxine (SYNTHROID, LEVOTHROID) 100 MCG tablet Take 75 mcg by mouth daily before breakfast.     Historical Provider, MD  lidocaine (LIDODERM) 5 % Place 1 patch onto the skin daily. Remove & Discard patch within 12 hours or as directed by MD    Historical Provider, MD  mineral oil-hydrophilic petrolatum (AQUAPHOR) ointment Apply 1 application topically as needed for dry skin.    Historical Provider, MD  nadolol (CORGARD) 40 MG tablet Take 40 mg by mouth daily.    Historical Provider, MD  Nutritional Supplements (FEEDING SUPPLEMENT, NEPRO CARB STEADY,) LIQD Take 237 mLs by mouth 3 (three) times daily between meals.    Historical Provider, MD  omeprazole (PRILOSEC) 20 MG capsule Take 20 mg by mouth daily.    Historical Provider, MD  oxyCODONE-acetaminophen (ROXICET) 5-325 MG per tablet Take 1 tablet by mouth every 8 (eight) hours as needed for moderate pain or severe pain (Do not drive or operate heavy machinery while taking as can cause drowsiness.). 07/15/14   Marylene Land, NP  pantoprazole (PROTONIX) 40 MG tablet Take 40 mg by mouth 2 (two) times daily.    Historical Provider, MD  polyethylene glycol (MIRALAX / GLYCOLAX) packet Take 17 g by mouth every other day. Mix in 4 to 8 ounces of fluid before taking.    Historical Provider, MD  pregabalin (LYRICA) 50 MG capsule Take 75 mg by mouth at bedtime.     Historical Provider, MD  rOPINIRole (REQUIP) 2 MG tablet Take 2 mg by mouth at bedtime.    Historical Provider, MD  selenium sulfide (SELSUN) 2.5 % shampoo Apply 1 application topically every 7 (seven) weeks.    Historical Provider, MD  sodium chloride (OCEAN) 0.65 % SOLN nasal spray Place 1 spray into both nostrils as  needed for congestion.    Historical Provider, MD  traZODone (DESYREL) 50 MG tablet Take 50 mg by mouth at bedtime.    Historical Provider, MD  triamcinolone cream (KENALOG) 0.1 % Apply 1 application topically daily as needed.    Historical Provider, MD  trolamine salicylate (ASPERCREME) 10 % cream Apply 1 application topically every 3 (three) days.    Historical Provider, MD  Vaginal Lubricant (REPLENS) GEL Place 1 application vaginally as needed.    Historical Provider, MD  venlafaxine (EFFEXOR) 37.5 MG tablet Take 37.5 mg by mouth at bedtime.     Historical Provider, MD     Results for orders placed or performed during the hospital encounter of 10/01/14 (from the past 48 hour(s))  CBC  with Differential     Status: Abnormal   Collection Time: 10/01/14  4:39 AM  Result Value Ref Range   WBC 11.9 (H) 3.6 - 11.0 K/uL   RBC 4.31 3.80 - 5.20 MIL/uL   Hemoglobin 11.3 (L) 12.0 - 16.0 g/dL   HCT 36.3 35.0 - 47.0 %   MCV 84.3 80.0 - 100.0 fL   MCH 26.1 26.0 - 34.0 pg   MCHC 31.0 (L) 32.0 - 36.0 g/dL   RDW 22.3 (H) 11.5 - 14.5 %   Platelets 131 (L) 150 - 440 K/uL   Neutrophils Relative % 81 %   Neutro Abs 9.8 (H) 1.4 - 6.5 K/uL   Lymphocytes Relative 7 %   Lymphs Abs 0.8 (L) 1.0 - 3.6 K/uL   Monocytes Relative 9 %   Monocytes Absolute 1.1 (H) 0.2 - 0.9 K/uL   Eosinophils Relative 2 %   Eosinophils Absolute 0.2 0 - 0.7 K/uL   Basophils Relative 1 %   Basophils Absolute 0.1 0 - 0.1 K/uL  Comprehensive metabolic panel     Status: Abnormal   Collection Time: 10/01/14  4:39 AM  Result Value Ref Range   Sodium 139 135 - 145 mmol/L   Potassium 4.4 3.5 - 5.1 mmol/L   Chloride 104 101 - 111 mmol/L   CO2 24 22 - 32 mmol/L   Glucose, Bld 394 (H) 65 - 99 mg/dL   BUN 52 (H) 6 - 20 mg/dL   Creatinine, Ser 4.07 (H) 0.44 - 1.00 mg/dL   Calcium 9.8 8.9 - 10.3 mg/dL   Total Protein 6.9 6.5 - 8.1 g/dL   Albumin 3.7 3.5 - 5.0 g/dL   AST 45 (H) 15 - 41 U/L   ALT 29 14 - 54 U/L   Alkaline  Phosphatase 146 (H) 38 - 126 U/L   Total Bilirubin 0.8 0.3 - 1.2 mg/dL   GFR calc non Af Amer 10 (L) >60 mL/min   GFR calc Af Amer 12 (L) >60 mL/min    Comment: (NOTE) The eGFR has been calculated using the CKD EPI equation. This calculation has not been validated in all clinical situations. eGFR's persistently <60 mL/min signify possible Chronic Kidney Disease.    Anion gap 11 5 - 15  Troponin I     Status: Abnormal   Collection Time: 10/01/14  4:39 AM  Result Value Ref Range   Troponin I 0.22 (H) <0.031 ng/mL    Comment: READ BACK AND VERIFIED WITH DAWN TULLOCH AT 0938 ON 10/01/14.Marland KitchenMarland KitchenKenneth        PERSISTENTLY INCREASED TROPONIN VALUES IN THE RANGE OF 0.04-0.49 ng/mL CAN BE SEEN IN:       -UNSTABLE ANGINA       -CONGESTIVE HEART FAILURE       -MYOCARDITIS       -CHEST TRAUMA       -ARRYHTHMIAS       -LATE PRESENTING MYOCARDIAL INFARCTION       -COPD   CLINICAL FOLLOW-UP RECOMMENDED.   Protime-INR     Status: None   Collection Time: 10/01/14  4:39 AM  Result Value Ref Range   Prothrombin Time 14.8 11.4 - 15.0 seconds   INR 1.14   APTT     Status: None   Collection Time: 10/01/14  4:39 AM  Result Value Ref Range   aPTT 29 24 - 36 seconds  Blood gas, arterial ([facility] & [facility] ONLY)     Status: Abnormal   Collection Time: 10/01/14  5:58  AM  Result Value Ref Range   FIO2 0.80    Delivery systems BILEVEL POSITIVE AIRWAY PRESSURE    LHR 12 resp/min   Inspiratory PAP 14    Expiratory PAP 5    pH, Arterial 7.29 (L) 7.350 - 7.450   pCO2 arterial 49 (H) 32.0 - 48.0 mmHg   pO2, Arterial 218 (H) 83.0 - 108.0 mmHg   Bicarbonate 23.6 21.0 - 28.0 mEq/L   Acid-base deficit 3.4 (H) 0.0 - 2.0 mmol/L   O2 Saturation 99.7 %   Patient temperature 37.0    Collection site RIGHT RADIAL    Sample type ARTERIAL DRAW    Allens test (pass/fail) PASS PASS   Dg Chest Portable 1 View  10/01/2014   CLINICAL DATA:  Shortness of breath and chest pain, symptoms for 6 hours.  EXAM: PORTABLE CHEST - 1 VIEW   COMPARISON:  08/11/2014  FINDINGS: The heart is enlarged, similar to prior. There is vascular congestion and perihilar edema. Probable bilateral pleural effusions. Retrocardiac opacity, likely atelectasis. No pneumothorax.  IMPRESSION: Findings suggest CHF.   Electronically Signed   By: Jeb Levering M.D.   On: 10/01/2014 05:56    Review of Systems  Constitutional: Negative for fever and chills.  HENT: Negative for sore throat and tinnitus.   Eyes: Negative for blurred vision and redness.  Respiratory: Positive for cough and shortness of breath.   Cardiovascular: Positive for chest pain. Negative for palpitations, orthopnea and PND.  Gastrointestinal: Negative for nausea, vomiting, abdominal pain and diarrhea.  Genitourinary: Negative for dysuria, urgency and frequency.  Musculoskeletal: Negative for myalgias and joint pain.  Skin: Negative for rash.       No lesions  Neurological: Negative for speech change, focal weakness and weakness.  Endo/Heme/Allergies: Does not bruise/bleed easily.       No temperature intolerance  Psychiatric/Behavioral: Negative for depression and suicidal ideas.    Blood pressure 154/72, pulse 111, resp. rate 22, weight 99.791 kg (220 lb), SpO2 100 %. Physical Exam  Nursing note and vitals reviewed. Constitutional: She is oriented to person, place, and time. She appears well-developed and well-nourished. She appears distressed.  HENT:  Head: Normocephalic and atraumatic.  Mouth/Throat: Oropharynx is clear and moist.  Eyes: Conjunctivae and EOM are normal. Pupils are equal, round, and reactive to light. No scleral icterus.  Neck: Normal range of motion. Neck supple. No JVD present. No tracheal deviation present. No thyromegaly present.  Cardiovascular: Normal rate, regular rhythm and normal heart sounds.  Exam reveals no gallop and no friction rub.   No murmur heard. Respiratory: Breath sounds normal. She is in respiratory distress. She has no wheezes.   GI: Soft. Bowel sounds are normal. She exhibits no distension. There is no tenderness.  Genitourinary:  Deferred  Musculoskeletal: Normal range of motion. She exhibits no edema.  Lymphadenopathy:    She has no cervical adenopathy.  Neurological: She is alert and oriented to person, place, and time. No cranial nerve deficit. She exhibits normal muscle tone.  Skin: Skin is warm and dry. No rash noted. No erythema.  Psychiatric: She has a normal mood and affect. Her behavior is normal. Judgment and thought content normal.     Assessment/Plan This 74 year old Caucasian female with end-stage renal disease admitted for respiratory distress secondary to fluid overload. 1. Pulmonary edema: The patient is due for dialysis today. We will coordinate with nephrology to dialyze the patient this morning. 2. Respiratory failure with hypercapnia and hypoxemia: Respiratory acidosis; continue BiPAP. Supplemental  oxygen as needed 3. Chest pain: Unclear if secondary to respiratory distress or acute coronary syndrome. Patient's EKG does not show changes indicative of ischemia, however troponin is elevated as one would expect in light of respiratory stress and ESRD. Continue to monitor telemetry. Serial troponins are low yield in this setting and we will have to observe the patient clinically to determine further need for cardiac evaluation. 4. Diabetes mellitus type 2: Continue basal insulin (the patient takes twice a day Lantus). Started on sliding scale insulin while hospitalized 5. End-stage renal disease: Continue PhosLo 6. Hypothyroidism: Continue Synthroid 7. Depression: Continue venlafaxine, Celexa and trazodone 8. DVT prophylaxis: Heparin 9. GI prophylaxis: Pantoprazole The patient is a full code. Time spent on admission orders and patient care approximately 35 minutes   Harrie Foreman 10/01/2014, 6:27 AM

## 2014-10-01 NOTE — ED Notes (Signed)
Patient resting quietly with eyes closed.  Patient appears more comfortable, less respiratory distress noted.

## 2014-10-01 NOTE — ED Notes (Signed)
Dr. Inocencio Homes notified of troponin results - 0.22.  Acknowledged.

## 2014-10-01 NOTE — ED Notes (Signed)
Nursing Note  Pt admitted to CCU room 19 for further evaluation and treatment.  Report called to Elnita Maxwell, Charity fundraiser.  Pt transported via stretcher with monitor and bipap on and RT, RN and ED tech accompanying patient.  Pt educated on admission and verbalized understanding.  Pt had 1 shoe and 1 sock with her, which were sent with her.  Pt reports she only came with one of each.  Pt in stable condition.

## 2014-10-02 ENCOUNTER — Inpatient Hospital Stay: Admit: 2014-10-02 | Payer: Medicare (Managed Care)

## 2014-10-02 ENCOUNTER — Inpatient Hospital Stay
Admit: 2014-10-02 | Discharge: 2014-10-02 | Disposition: A | Discharge status: Home / Self Care | Payer: Medicare (Managed Care) | Attending: Internal Medicine | Admitting: Internal Medicine

## 2014-10-02 DIAGNOSIS — I214 Non-ST elevation (NSTEMI) myocardial infarction: Secondary | ICD-10-CM | POA: Diagnosis not present

## 2014-10-02 LAB — HEPATITIS B SURFACE ANTIBODY, QUANTITATIVE: Hepatitis B-Post: <3.1 m[IU]/mL — ABNORMAL LOW (ref >9.9)

## 2014-10-02 LAB — GLUCOSE, CAPILLARY
Glucose-Capillary: 399 mg/dL — ABNORMAL HIGH (ref 65–99)
Glucose-Capillary: 194 mg/dL — ABNORMAL HIGH (ref 65–99)
Glucose-Capillary: 108 mg/dL — ABNORMAL HIGH (ref 65–99)
Glucose-Capillary: 178 mg/dL — ABNORMAL HIGH (ref 65–99)

## 2014-10-02 LAB — HEPATITIS B SURFACE ANTIGEN: Hepatitis B Surface Ag: NEGATIVE

## 2014-10-02 MED ORDER — INSULIN GLARGINE 100 UNIT/ML ~~LOC~~ SOLN
40.0000 [IU] | Freq: Two times a day (BID) | SUBCUTANEOUS | Status: DC
  Administered 2014-10-02 – 2014-10-05 (×6): 40 [IU] via SUBCUTANEOUS
  Filled 2014-10-02 (×12): qty 0.4

## 2014-10-02 MED ORDER — ALBUMIN HUMAN 25 % IV SOLN
12.5000 g | Freq: Once | INTRAVENOUS | Status: AC
  Administered 2014-10-02: 15:00:00 12.5 g via INTRAVENOUS
  Filled 2014-10-02: qty 50

## 2014-10-02 MED ORDER — INSULIN ASPART 100 UNIT/ML ~~LOC~~ SOLN
0.0000 [IU] | Freq: Three times a day (TID) | SUBCUTANEOUS | Status: DC
  Administered 2014-10-02: 18:00:00 2 [IU] via SUBCUTANEOUS
  Administered 2014-10-02: 3 [IU] via SUBCUTANEOUS
  Administered 2014-10-03: 2 [IU] via SUBCUTANEOUS
  Administered 2014-10-03 (×2): 3 [IU] via SUBCUTANEOUS
  Administered 2014-10-04: 5 [IU] via SUBCUTANEOUS
  Administered 2014-10-04: 3 [IU] via SUBCUTANEOUS
  Administered 2014-10-04: 2 [IU] via SUBCUTANEOUS
  Administered 2014-10-04: 3 [IU] via SUBCUTANEOUS
  Administered 2014-10-05: 1 [IU] via SUBCUTANEOUS
  Filled 2014-10-02 (×2): qty 3
  Filled 2014-10-02 (×4): qty 0.1
  Filled 2014-10-02: qty 3
  Filled 2014-10-02: qty 1
  Filled 2014-10-02: qty 2
  Filled 2014-10-02: qty 3
  Filled 2014-10-02: qty 5
  Filled 2014-10-02: qty 2
  Filled 2014-10-02 (×2): qty 0.1
  Filled 2014-10-02: qty 3
  Filled 2014-10-02: qty 2

## 2014-10-02 NOTE — Progress Notes (Signed)
HD START   10/02/14 1020  Vital Signs  Pulse Rate 75  Resp (!) 25  BP (!) 107/55 mmHg  During Hemodialysis Assessment  Blood Flow Rate (mL/min) 400 mL/min  Arterial Pressure (mmHg) -120 mmHg  Venous Pressure (mmHg) 150 mmHg  Transmembrane Pressure (mmHg) 60 mmHg  Ultrafiltration Rate (mL/min) 710 mL/min  Dialysate Flow Rate (mL/min) 600 ml/min  Conductivity: Machine  14  HD Safety Checks Performed Yes  Dialysis Fluid Bolus Normal Saline  Bolus Amount (mL) 250 mL  Intra-Hemodialysis Comments HD TREATMENT INTITATED. GOAL TO REMOVE 2L IN 3.5 HOURS. 15G NEEDLES INSERTED WITH SOME DIFFICULTY WITH ARTERIAL LINE. PT ALERT AND VS STABLE.

## 2014-10-02 NOTE — Progress Notes (Signed)
Initial Nutrition Assessment   INTERVENTION:   Meals and Snacks: Cater to patient preferences. RD asked pt if chopped meats and/or dysphagia III consistency necessary for poor dentition, however pt declined at this time. Medical Food Supplement Therapy: Agree with Nepro as ordered   NUTRITION DIAGNOSIS:   No nutrition diagnosis at this time  GOAL:   Patient will meet greater than or equal to 90% of their needs  MONITOR:    (Energy Intake, Electrolyte and Renal Profile, Anthropometrics)  REASON FOR ASSESSMENT:    (Renal diet)    ASSESSMENT:   Pt admitted with pulmonary edema and respiratory failure. Pt with h/o ESRD on HD.  Past Medical History  Diagnosis Date  . Chronic kidney disease   . GERD (gastroesophageal reflux disease)   . Carotid artery stenosis   . Coronary artery disease   . Hypothyroidism   . Diabetes mellitus without complication   . Depression   . Arthritis   . Kidney failure   . Liver failure   . MI (myocardial infarction)   . HTN (hypertension)   . Broken leg     R     Diet Order:  Diet renal/carb modified with fluid restriction Diet-HS Snack?: Nothing; Room service appropriate?: Yes; Fluid consistency:: Thin    Current Nutrition: Pt reports eating well this am including eggs. Pt edentulous with difficulty chewing and therefore needs softer foods. Pt reports having meatloaf for dinner last night and tolerated well.  Food/Nutrition-Related History: Pt reports good appetite PTA. Pt reports [facility] program bringing food for her and that it usually had double portions such as chicken salad sandwich. Pt reports liking and drinking Nepro.   Medications: colace, ferrous fumurate, novolog, Lantus, dulcolax, albumin, vitamin D  Electrolyte/Renal Profile and Glucose Profile:   Recent Labs Lab 10/01/14 0439  NA 139  K 4.4  CL 104  CO2 24  BUN 52*  CREATININE 4.07*  CALCIUM 9.8  GLUCOSE 394*   Protein Profile:  Recent Labs Lab 10/01/14 0439   ALBUMIN 3.7    Gastrointestinal Profile: Last BM:  09/30/2014   Nutrition-Focused Physical Exam Findings: Nutrition-Focused physical exam completed. Findings are WDL for fat depletion, muscle depletion, and edema.    Weight Change: Pt reports usual dry weight 89kg   Skin:  Reviewed, no issues   Height:   Ht Readings from Last 1 Encounters:  10/01/14 5' (1.524 m)    Weight:   Wt Readings from Last 1 Encounters:  10/02/14 195 lb 1.7 oz (88.5 kg)   Wt Readings from Last 10 Encounters:  10/02/14 195 lb 1.7 oz (88.5 kg)  08/09/14 190 lb (86.183 kg)  08/07/14 190 lb (86.183 kg)  07/15/14 180 lb (81.647 kg)  06/11/14 180 lb (81.647 kg)   Ideal Body Weight:   45.5kg  BMI:  Body mass index is 38.1 kg/(m^2).  Estimated Nutritional Needs:   Kcal:  1375-1604kcals, BEE: 882kcals, TEE: (IF 1.2-1.4)(AF 1.3) using IBW of 45.5kg  Protein:  55-68g protein (1.2-1.5g/kg) using IBW of 45.5kg  Fluid:  UOP+1L   MODERATE Care Level  Leda Quail, RD, LDN Pager [phone]

## 2014-10-02 NOTE — Progress Notes (Signed)
PRE HD   10/02/14 1000  Neurological  Level of Consciousness Alert  Orientation Level Oriented X4  Respiratory  Respiratory Pattern Regular;Unlabored  Chest Assessment Chest expansion symmetrical  Bilateral Breath Sounds Diminished;Fine crackles  Cardiac  Pulse Regular  Jugular Venous Distention (JVD) No  ECG Monitor Yes  Cardiac Rhythm NSR  Vascular  Edema Generalized;Right lower extremity;Left lower extremity  Generalized Edema Other (Comment) (NON PITTING)  RLE Edema +1  LLE Edema +1  Integumentary  Integumentary (WDL) X  Skin Color Appropriate for ethnicity  Skin Condition Dry;Flaky  Skin Integrity Ecchymosis  Ecchymosis Location Arm  Ecchymosis Location Orientation Left;Upper  Musculoskeletal  Musculoskeletal (WDL) X  Generalized Weakness Yes  Gastrointestinal  Bowel Sounds Assessment Active  GU Assessment  Genitourinary (WDL) X  Genitourinary Symptoms Other (Comment) (HD)  Psychosocial  Psychosocial (WDL) WDL

## 2014-10-02 NOTE — Progress Notes (Signed)
POST HD   10/02/14 1415  Neurological  Level of Consciousness Alert  Orientation Level Oriented X4  Respiratory  Respiratory Pattern Regular;Unlabored  Chest Assessment Chest expansion symmetrical  Bilateral Breath Sounds Diminished;Fine crackles  Cardiac  Pulse Regular  ECG Monitor Yes  Cardiac Rhythm NSR  Vascular  Edema Generalized;Right lower extremity;Left lower extremity  Generalized Edema Other (Comment) (NON PITTING)  RLE Edema +1  LLE Edema +1  Integumentary  Integumentary (WDL) X  Skin Color Appropriate for ethnicity  Skin Condition Dry;Flaky  Skin Integrity Ecchymosis  Ecchymosis Location Arm  Ecchymosis Location Orientation Left;Upper  Musculoskeletal  Musculoskeletal (WDL) X  Generalized Weakness Yes  Gastrointestinal  Bowel Sounds Assessment Active  GU Assessment  Genitourinary (WDL) X  Genitourinary Symptoms Other (Comment) (HD)  Psychosocial  Psychosocial (WDL) WDL

## 2014-10-02 NOTE — Progress Notes (Signed)
Subjective:   Underwent urgent HD yesterday Breathing much better today 2500 cc was removed .  Objective:  Vital signs in last 24 hours:  Temp:  [97.6 F (36.4 C)-98.7 F (37.1 C)] 98 F (36.7 C) (09/13 0448) Pulse Rate:  [81-102] 81 (09/13 0448) Resp:  [18-31] 18 (09/13 0448) BP: (92-149)/(38-108) 102/45 mmHg (09/13 0448) SpO2:  [98 %-100 %] 98 % (09/13 0448) Weight:  [87.5 kg (192 lb 14.4 oz)-91.445 kg (201 lb 9.6 oz)] 91.445 kg (201 lb 9.6 oz) (09/13 0500)  Weight change: -0.091 kg (-3.2 oz) Filed Weights   10/01/14 0945 10/01/14 1315 10/02/14 0500  Weight: 90.9 kg (200 lb 6.4 oz) 87.5 kg (192 lb 14.4 oz) 91.445 kg (201 lb 9.6 oz)    Intake/Output:    Intake/Output Summary (Last 24 hours) at 10/02/14 0936 Last data filed at 10/02/14 0249  Gross per 24 hour  Intake      0 ml  Output   3133 ml  Net  -3133 ml     Physical Exam: General:  laying in the bed, no distress   HEENT   Moist oral mucus membraned  Neck  supple   Pulm/lungs  bilateral mild crackles   CVS/Heart  no rub or gallop   Abdomen:   soft, nontender   Extremities:  1+ pitting edema bilaterally   Neurologic:  alert, able to answer questions by nodding yes/no   Skin:  some erythema over lower extremities   Access:        Basic Metabolic Panel:   Recent Labs Lab 10/01/14 0439  NA 139  K 4.4  CL 104  CO2 24  GLUCOSE 394*  BUN 52*  CREATININE 4.07*  CALCIUM 9.8     CBC:  Recent Labs Lab 10/01/14 0439  WBC 11.9*  NEUTROABS 9.8*  HGB 11.3*  HCT 36.3  MCV 84.3  PLT 131*      Microbiology:  Recent Results (from the past 720 hour(s))  Blood culture (routine x 2)     Status: None (Preliminary result)   Collection Time: 10/01/14  4:40 AM  Result Value Ref Range Status   Specimen Description BLOOD RIGHT THUMB  Final   Special Requests BOTTLES DRAWN AEROBIC AND ANAEROBIC  Final   Culture NO GROWTH 1 DAY  Final   Report Status PENDING  Incomplete  Blood culture (routine  x 2)     Status: None (Preliminary result)   Collection Time: 10/01/14  4:40 AM  Result Value Ref Range Status   Specimen Description BLOOD RIGHT HAND  Final   Special Requests BOTTLES DRAWN AEROBIC AND ANAEROBIC  Final   Culture NO GROWTH 1 DAY  Final   Report Status PENDING  Incomplete  MRSA PCR Screening     Status: None   Collection Time: 10/01/14  8:47 AM  Result Value Ref Range Status   MRSA by PCR NEGATIVE NEGATIVE Final    Comment:        The GeneXpert MRSA Assay (FDA approved for NASAL specimens only), is one component of a comprehensive MRSA colonization surveillance program. It is not intended to diagnose MRSA infection nor to guide or monitor treatment for MRSA infections.     Coagulation Studies:  Recent Labs  10/01/14 0439  LABPROT 14.8  INR 1.14    Urinalysis: No results for input(s): COLORURINE, LABSPEC, PHURINE, GLUCOSEU, HGBUR, BILIRUBINUR, KETONESUR, PROTEINUR, UROBILINOGEN, NITRITE, LEUKOCYTESUR in the last 72 hours.  Invalid input(s): APPERANCEUR    Imaging: Dg  Chest Portable 1 View  10/01/2014   CLINICAL DATA:  Shortness of breath and chest pain, symptoms for 6 hours.  EXAM: PORTABLE CHEST - 1 VIEW  COMPARISON:  08/11/2014  FINDINGS: The heart is enlarged, similar to prior. There is vascular congestion and perihilar edema. Probable bilateral pleural effusions. Retrocardiac opacity, likely atelectasis. No pneumothorax.  IMPRESSION: Findings suggest CHF.   Electronically Signed   By: Rubye Oaks M.D.   On: 10/01/2014 05:56     Medications:     . aspirin EC  81 mg Oral Daily  . atorvastatin  80 mg Oral QHS  . bisacodyl  5 mg Oral QHS  . calcium acetate  667 mg Oral TID WC  . calcium carbonate  1 tablet Oral TID  . cephALEXin  250 mg Oral QHS  . citalopram  10 mg Oral Daily  . docusate sodium  100 mg Oral BID  . epoetin (EPOGEN/PROCRIT) injection  4,000 Units Intravenous Q M,W,F-HD  . famotidine  20 mg Oral Q breakfast  . feeding  supplement (NEPRO CARB STEADY)  237 mL Oral TID BM  . ferrous fumarate  1 tablet Oral Daily  . heparin  5,000 Units Subcutaneous 3 times per day  . Influenza vac split quadrivalent PF  0.5 mL Intramuscular Tomorrow-1000  . insulin aspart  0-10 Units Subcutaneous TID WC  . insulin glargine  60 Units Subcutaneous QHS  . isosorbide mononitrate  60 mg Oral QAC breakfast  . levothyroxine  75 mcg Oral QAC breakfast  . lidocaine  1 patch Transdermal Q24H  . loratadine  10 mg Oral Daily  . nadolol  40 mg Oral Daily  . nitroGLYCERIN  0.5 inch Topical 4 times per day  . pantoprazole  40 mg Oral Daily  . pneumococcal 23 valent vaccine  0.5 mL Intramuscular Tomorrow-1000  . polyethylene glycol  17 g Oral QODAY  . pregabalin  75 mg Oral QHS  . rOPINIRole  2 mg Oral QHS  . sodium chloride  3 mL Intravenous Q12H  . traZODone  50 mg Oral QHS  . trolamine salicylate  1 application Topical Q3 days  . venlafaxine  37.5 mg Oral QHS  . [START ON 10/05/2014] Vitamin D (Ergocalciferol)  50,000 Units Oral Weekly   acetaminophen **OR** acetaminophen, mineral oil-hydrophilic petrolatum, ondansetron **OR** ondansetron (ZOFRAN) IV, oxyCODONE-acetaminophen, REPLENS, sodium chloride, triamcinolone cream  Assessment/ Plan:  74 y.o. female with end-stage renal disease, carotid stenosis, coronary artery disease, diabetes type 2, history of myocardial infarction, hypertension, peripheral vascular disease, admitted for acute pulmonary edema  1. End-stage renal disease. [facility]. Followed by [facility] 2. AOCKD 3. SHPTH 4. Acute pulmonary edema  Plan: Repeat HD today UF as tolerated Will assess need for HD on a daily basis   LOS: 1 Cordell Coke 9/13/20169:36 AM

## 2014-10-02 NOTE — Progress Notes (Signed)
HD END 

## 2014-10-02 NOTE — Progress Notes (Signed)
Inpatient Diabetes Program Recommendations  AACE/ADA: New Consensus Statement on Inpatient Glycemic Control (2013)  Target Ranges:  Prepandial:   less than 140 mg/dL      Peak postprandial:   less than 180 mg/dL (1-2 hours)      Critically ill patients:  140 - 180 mg/dL   Results for Savannah Key, Savannah Key (MRN [id number]) as of 10/02/2014 08:35  Ref. Range 10/01/2014 11:24 10/01/2014 16:15 10/01/2014 16:16 10/01/2014 20:58 10/02/2014 07:37  Glucose-Capillary Latest Ref Range: 65-99 mg/dL 119 (H) 147 (H) 829 (H) 371 (H) 194 (H)    Diabetes history: DM2 Outpatient Diabetes medications: Lantus 90 units QAM, Lantus 50 units QPM with supper, NPH 15 units TID with meals Current orders for Inpatient glycemic control: Lantus 60 units QHS, Novolog 0-10 units TID with meals  Inpatient Diabetes Program Recommendations Correction (SSI): Glucose was 371 mg/dl last night at 56:21 but no Novolog correction given since no bedtime Novolog ordered. Please consider ordering Novolog bedtime correction scale. Insulin - Meal Coverage: If patient is eating at least 50% of meals, please consider ordering Novolog 4 units TID with meals.  Thanks, Orlando Penner, RN, MSN, CCRN, CDE Diabetes Coordinator Inpatient Diabetes Program [phone] (Team Pager from 8am to 5pm) [phone] (AP office) [phone] [facility] office) [phone] [facility] office)'

## 2014-10-02 NOTE — Progress Notes (Signed)
[facility] - [place] at [facility]   PATIENT NAME: Savannah Key    MR#:  [id number]  DATE OF BIRTH:  10-19-1940  SUBJECTIVE:  CHIEF COMPLAINT:   Chief Complaint  Patient presents with  . Shortness of Breath  . Chest Pain   Came with SOB and pulmonary edema, she is on HD as out pt.   HD done , she feels better. On nasal canula oxygen- again HD  REVIEW OF SYSTEMS:  CONSTITUTIONAL: No fever, fatigue or weakness.  EYES: No blurred or double vision.  EARS, NOSE, AND THROAT: No tinnitus or ear pain.  RESPIRATORY: No cough,positive for shortness of breath, wheezing or hemoptysis.  CARDIOVASCULAR: No chest pain, orthopnea, edema.  GASTROINTESTINAL: No nausea, vomiting, diarrhea or abdominal pain.  GENITOURINARY: No dysuria, hematuria.  ENDOCRINE: No polyuria, nocturia,  HEMATOLOGY: No anemia, easy bruising or bleeding SKIN: No rash or lesion. MUSCULOSKELETAL: No joint pain or arthritis.   NEUROLOGIC: No tingling, numbness, weakness.  PSYCHIATRY: No anxiety or depression.   ROS  DRUG ALLERGIES:   Allergies  Allergen Reactions  . Sulfa Antibiotics Other (See Comments)    caused bloody urine    VITALS:  Blood pressure 110/46, pulse 77, temperature 98.4 F (36.9 C), temperature source Oral, resp. rate 20, height 5' (1.524 m), weight 85.8 kg (189 lb 2.5 oz), SpO2 94 %.  PHYSICAL EXAMINATION:  GENERAL:  75 y.o.-year-old patient lying in the bed with no acute distress.  EYES: Pupils equal, round, reactive to light and accommodation. No scleral icterus. Extraocular muscles intact.  HEENT: Head atraumatic, normocephalic. Oropharynx and nasopharynx clear.  NECK:  Supple, no jugular venous distention. No thyroid enlargement, no tenderness.  LUNGS: Normal breath sounds bilaterally, no wheezing, mild crepitation. No use of accessory muscles of respiration. Uses supplemental oxygen. CARDIOVASCULAR: S1, S2 normal. No murmurs, rubs, or gallops.  ABDOMEN: Soft,  nontender, nondistended. Bowel sounds present. No organomegaly or mass.  EXTREMITIES: mild pedal edema, no cyanosis, or clubbing.  NEUROLOGIC: Cranial nerves II through XII are intact. Muscle strength 5/5 in all extremities. Sensation intact. Gait not checked.  PSYCHIATRIC: The patient is alert and oriented x 3.  SKIN: No obvious rash, lesion, or ulcer.   Physical Exam LABORATORY PANEL:   CBC  Recent Labs Lab 10/01/14 0439  WBC 11.9*  HGB 11.3*  HCT 36.3  PLT 131*   ------------------------------------------------------------------------------------------------------------------  Chemistries   Recent Labs Lab 10/01/14 0439  NA 139  K 4.4  CL 104  CO2 24  GLUCOSE 394*  BUN 52*  CREATININE 4.07*  CALCIUM 9.8  AST 45*  ALT 29  ALKPHOS 146*  BILITOT 0.8   ------------------------------------------------------------------------------------------------------------------  Cardiac Enzymes  Recent Labs Lab 10/01/14 0439  TROPONINI 0.22*   ------------------------------------------------------------------------------------------------------------------  RADIOLOGY:  Dg Chest Portable 1 View  10/01/2014   CLINICAL DATA:  Shortness of breath and chest pain, symptoms for 6 hours.  EXAM: PORTABLE CHEST - 1 VIEW  COMPARISON:  08/11/2014  FINDINGS: The heart is enlarged, similar to prior. There is vascular congestion and perihilar edema. Probable bilateral pleural effusions. Retrocardiac opacity, likely atelectasis. No pneumothorax.  IMPRESSION: Findings suggest CHF.   Electronically Signed   By: Rubye Oaks M.D.   On: 10/01/2014 05:56    ASSESSMENT AND PLAN:   This 74 year old Caucasian female with end-stage renal disease admitted for respiratory distress secondary to fluid overload. 1. Pulmonary edema:    Appreciated nephrology help- HD done.     Suggested to do HD again today.  2. Respiratory failure with hypercapnia and hypoxemia: Respiratory acidosis;  Initially  required BiPAP. Now on Supplemental oxygen as needed, will try to taper oxygen. 3. Chest pain: likely secondary to respiratory distress and ESRD. 4. Diabetes mellitus type 2: Continue basal insulin (the patient takes twice a day Lantus). Started on sliding scale insulin while hospitalized   She is on very high dose of insulin. 5. End-stage renal disease: Continue PhosLo 6. Hypothyroidism: Continue Synthroid 7. Depression: Continue venlafaxine, Celexa and trazodone 8. DVT prophylaxis: Heparin 9. GI prophylaxis: Pantoprazole   All the records are reviewed and case discussed with Care Management/Social Workerr. Management plans discussed with the patient and they are in agreement.  CODE STATUS: full  TOTAL TIME TAKING CARE OF THIS PATIENT: 35 minutes.   POSSIBLE D/C IN 1-2 DAYS, DEPENDING ON CLINICAL CONDITION.   Altamese Dilling M.D on 10/02/2014   Between 7am to 6pm - Pager - [phone]  After 6pm go to [email or web address] - password [email or web address] [facility]  [facility]  Office  [phone]  CC: Primary care physician; Bobbye Morton, MD

## 2014-10-02 NOTE — Progress Notes (Signed)
MEDICATION RELATED CONSULT NOTE - INITIAL   Pharmacy Consult for Medical adjustments Indication: ESRD/HD   Allergies  Allergen Reactions  . Sulfa Antibiotics Other (See Comments)    caused bloody urine    Patient Measurements: Height: 5' (152.4 cm) Weight: 201 lb 9.6 oz (91.445 kg) IBW/kg (Calculated) : 45.5  Vital Signs: Temp: 98 F (36.7 C) (09/13 0448) Temp Source: Oral (09/13 0448) BP: 102/45 mmHg (09/13 0448) Pulse Rate: 81 (09/13 0448) Intake/Output from previous day: 09/12 0701 - 09/13 0700 In: 0  Out: 3133 [Urine:450] Intake/Output from this shift:    Labs:  Recent Labs  10/01/14 0439  WBC 11.9*  HGB 11.3*  HCT 36.3  PLT 131*  APTT 29  CREATININE 4.07*  ALBUMIN 3.7  PROT 6.9  AST 45*  ALT 29  ALKPHOS 146*  BILITOT 0.8   Estimated Creatinine Clearance: 12.4 mL/min (by C-G formula based on Cr of 4.07).   Microbiology: Recent Results (from the past 720 hour(s))  Blood culture (routine x 2)     Status: None (Preliminary result)   Collection Time: 10/01/14  4:40 AM  Result Value Ref Range Status   Specimen Description BLOOD RIGHT THUMB  Final   Special Requests BOTTLES DRAWN AEROBIC AND ANAEROBIC  Final   Culture NO GROWTH < 12 HOURS  Final   Report Status PENDING  Incomplete  Blood culture (routine x 2)     Status: None (Preliminary result)   Collection Time: 10/01/14  4:40 AM  Result Value Ref Range Status   Specimen Description BLOOD RIGHT HAND  Final   Special Requests BOTTLES DRAWN AEROBIC AND ANAEROBIC  Final   Culture NO GROWTH < 12 HOURS  Final   Report Status PENDING  Incomplete  MRSA PCR Screening     Status: None   Collection Time: 10/01/14  8:47 AM  Result Value Ref Range Status   MRSA by PCR NEGATIVE NEGATIVE Final    Comment:        The GeneXpert MRSA Assay (FDA approved for NASAL specimens only), is one component of a comprehensive MRSA colonization surveillance program. It is not intended to diagnose  MRSA infection nor to guide or monitor treatment for MRSA infections.     Medical History: Past Medical History  Diagnosis Date  . Chronic kidney disease   . GERD (gastroesophageal reflux disease)   . Carotid artery stenosis   . Coronary artery disease   . Hypothyroidism   . Diabetes mellitus without complication   . Depression   . Arthritis   . Kidney failure   . Liver failure   . MI (myocardial infarction)   . HTN (hypertension)   . Broken leg     R    Medications:  Scheduled:  . aspirin EC  81 mg Oral Daily  . atorvastatin  80 mg Oral QHS  . bisacodyl  5 mg Oral QHS  . calcium acetate  667 mg Oral TID WC  . calcium carbonate  1 tablet Oral TID  . cephALEXin  250 mg Oral QHS  . citalopram  10 mg Oral Daily  . docusate sodium  100 mg Oral BID  . epoetin (EPOGEN/PROCRIT) injection  4,000 Units Intravenous Q M,W,F-HD  . famotidine  20 mg Oral Q breakfast  . feeding supplement (NEPRO CARB STEADY)  237 mL Oral TID BM  . ferrous fumarate  1 tablet Oral Daily  . heparin  5,000 Units Subcutaneous 3 times per day  . Influenza vac  split quadrivalent PF  0.5 mL Intramuscular Tomorrow-1000  . insulin aspart  0-10 Units Subcutaneous TID WC  . insulin glargine  60 Units Subcutaneous QHS  . isosorbide mononitrate  60 mg Oral QAC breakfast  . levothyroxine  75 mcg Oral QAC breakfast  . lidocaine  1 patch Transdermal Q24H  . loratadine  10 mg Oral Daily  . nadolol  40 mg Oral Daily  . nitroGLYCERIN  0.5 inch Topical 4 times per day  . pantoprazole  40 mg Oral Daily  . pneumococcal 23 valent vaccine  0.5 mL Intramuscular Tomorrow-1000  . polyethylene glycol  17 g Oral QODAY  . pregabalin  75 mg Oral QHS  . rOPINIRole  2 mg Oral QHS  . sodium chloride  3 mL Intravenous Q12H  . traZODone  50 mg Oral QHS  . trolamine salicylate  1 application Topical Q3 days  . venlafaxine  37.5 mg Oral QHS  . [START ON 10/05/2014] Vitamin D (Ergocalciferol)  50,000 Units Oral Weekly     Assessment: Pharmacy consulted to review medications for renal adjustments in a 74 yo female with ESRD requiring HD.   SCr: 4.07, est CrCl~12.4 mL/min  Goal of Therapy:  Renal adjustments in ESRD to avoid ADRs  Plan:  No adjustments warranted at this time.  Pharmacy will continue to follow.  Clarisa Schools, PharmD Clinical Pharmacist 10/02/2014

## 2014-10-02 NOTE — Progress Notes (Signed)
PRE HD   10/02/14 1015  Vital Signs  Temp 98.6 F (37 C)  Temp Source Oral  Pulse Rate 76  Pulse Rate Source Monitor  Resp (!) 24  BP (!) 108/51 mmHg  BP Location Right Arm  BP Method Automatic  Patient Position (if appropriate) Lying  Oxygen Therapy  SpO2 96 %  O2 Device Nasal Cannula  O2 Flow Rate (L/min) 3 L/min  Pain Assessment  Pain Assessment No/denies pain  Pain Score 0  Dialysis Weight  Weight 88.5 kg (195 lb 1.7 oz)  Type of Weight Pre-Dialysis  Time-Out for Hemodialysis  What Procedure? HEMODIALYSIS  Pt Identifiers(min of two) First/Last Name;MRN/Account#  Correct Site? Yes  Correct Side? Yes  Correct Procedure? Yes  Consents Verified? Yes  Rad Studies Available? N/A  Safety Precautions Reviewed? Yes  Machine [employer] Number [id number]  Station Number 4  UF/Alarm Test Passed  Conductivity: Meter 13.9  Conductivity: Machine  14  pH 7.4  Reverse Osmosis MAIN  Dialyzer Lot Number [id number]  Disposable Set Lot Number [id number]  Machine Temperature 98.6 F (37 C)  Immunologist and Audible Yes  Blood Lines Intact and Secured Yes  Pre Treatment Patient Checks  Vascular access used during treatment Graft  Hepatitis B Surface Antigen Results Negative  Date Hepatitis B Surface Antigen Drawn 10/01/14  Hemodialysis Consent Verified Yes  Hemodialysis Standing Orders Initiated Yes  ECG (Telemetry) Monitor On Yes  Prime Ordered Normal Saline  Length of  DialysisTreatment -hour(s) 3.5 Hour(s)  Dialysis Treatment Comments GOAL TO REMOVE 2L IN 3.5 HOURS. PT ALERT AND VS STABLE.  SOME DIFFICULTY WITH ARTERIAL LINE DURING INSERTION  Dialyzer Optiflux 180 NR  Dialysate 2.5 Ca (3K)  Dialysis Anticoagulant None  Dialysate Flow Ordered 600  Blood Flow Rate Ordered 400 mL/min  Ultrafiltration Goal 2 Liters  Pre Treatment Labs Renal panel;Differential;CBC  Dialysis Blood Pressure Support Ordered Normal Saline  Fistula / Graft Left  Arteriovenous fistula  No  Placement Date or Time found.   Orientation: Left  Access Location: (c)   Access Type: Arteriovenous fistula  Site Condition No complications  Fistula / Graft Assessment Present;Thrill;Bruit  Status Accessed  Needle Size 15  Drainage Description None

## 2014-10-02 NOTE — Care Management (Signed)
Admitted to this facility with the diagnosis of respiratort failure. Lives with husband, Doreatha Martin. Daughter is Harriett Sine [phone]). [facility] years. Dialysis Monday-Wednesday-Friday at [facility] in [place]. [facility] transports. [facility] on Tuesday-Thursday. Last seen Dr. Victory Dakin about 2 weeks ago. No home oxygen. No home health. [facility] last October following a broken leg. [facility] in the home. Takes care of all basic activities of daily living herself. Husband does house work. Uses a rolling walker to aid in ambulation. No falls since last October. Fair appetite.  [facility] can transport if before 2PM, if after granddaughter will transport. Gwenette Greet RN MSN Care Management [phone]

## 2014-10-02 NOTE — Plan of Care (Signed)
Problem: Discharge Progression Outcomes Goal: Other Discharge Outcomes/Goals Outcome: Progressing Pt is alert and oriented, no c/o pain at this time. Encouraged to call for assistance when needed. Low blood pressure, nitro patch not given at this, reassess in the AM.

## 2014-10-02 NOTE — Progress Notes (Signed)
*  PRELIMINARY RESULTS* Echocardiogram 2D Echocardiogram has been performed.  Savannah Key 10/02/2014, 7:38 PM

## 2014-10-02 NOTE — Plan of Care (Signed)
Problem: Discharge Progression Outcomes Goal: Other Discharge Outcomes/Goals Outcome: Progressing Plan of care progress to goal for: 1. Pain-no c/o pain this shift 2. Hemodynamically-             -VSS, pt remains afebrile             -pt received dialysis today, removed 2 L fluid 3. Complications-no evidence of this shift 4. Diet-pt has poor po intake this shift 5. Activity-pt up to BR with 1 assist  Pt weaned of O2 today, has been 94-95% on RA

## 2014-10-02 NOTE — Progress Notes (Signed)
POST HD   10/02/14 1415  Vital Signs  Temp 98.6 F (37 C)  Temp Source Oral  Pulse Rate 69  Pulse Rate Source Monitor  Resp 18  BP (!) 92/41 mmHg  BP Location Right Arm  BP Method Automatic  Patient Position (if appropriate) Lying  Dialysis Weight  Weight 85.8 kg (189 lb 2.5 oz)  Type of Weight Post-Dialysis  During Hemodialysis Assessment  Intra-Hemodialysis Comments HD TREATMENT END. 15G NEEDLE REMOVED AND BLOOD RETURNED PER POLICY.  PT ALERT AND VS STABLE.   Post-Hemodialysis Assessment  Rinseback Volume (mL) 250 mL  Dialyzer Clearance Lightly streaked  Duration of HD Treatment -hour(s) 3.5 hour(s)  Hemodialysis Intake (mL) 500 mL  UF Total -Machine (mL) 2500 mL  Net UF (mL) 2000 mL  Tolerated HD Treatment Yes  Post-Hemodialysis Comments HD TREATMENT END. 15G NEEDLE REMOVED AND BLOOD RETURNED PER POLICY.  PT ALERT AND VS STABLE.   AVG/AVF Arterial Site Held (minutes) 7 minutes  AVG/AVF Venous Site Held (minutes) 7 minutes  Education / Care Plan  Hemodialysis Education Provided Yes  Documented Education in Clinical Pathway Yes  Fistula / Graft Left  Arteriovenous fistula  No Placement Date or Time found.   Orientation: Left  Access Location: (c)   Access Type: Arteriovenous fistula  Site Condition No complications  Fistula / Graft Assessment Present;Thrill;Bruit  Status Deaccessed  Needle Size 15  Drainage Description None

## 2014-10-03 DIAGNOSIS — I509 Heart failure, unspecified: Secondary | ICD-10-CM

## 2014-10-03 LAB — GLUCOSE, CAPILLARY
Glucose-Capillary: 194 mg/dL — ABNORMAL HIGH (ref 65–99)
Glucose-Capillary: 210 mg/dL — ABNORMAL HIGH (ref 65–99)
Glucose-Capillary: 135 mg/dL — ABNORMAL HIGH (ref 65–99)
Glucose-Capillary: 233 mg/dL — ABNORMAL HIGH (ref 65–99)

## 2014-10-03 LAB — CBC
HEMATOCRIT: 31.6 % — AB (ref 35.0–47.0)
Hemoglobin: 10 g/dL — ABNORMAL LOW (ref 12.0–16.0)
MCH: 26.1 pg (ref 26.0–34.0)
MCHC: 31.6 g/dL — ABNORMAL LOW (ref 32.0–36.0)
MCV: 82.5 fL (ref 80.0–100.0)
PLATELETS: 101 10*3/uL — AB (ref 150–440)
RBC: 3.83 MIL/uL (ref 3.80–5.20)
RDW: 21.3 % — AB (ref 11.5–14.5)
WBC: 6.8 10*3/uL (ref 3.6–11.0)

## 2014-10-03 LAB — HEPARIN LEVEL (UNFRACTIONATED): Heparin Unfractionated: 0.47 IU/mL (ref 0.30–0.70)

## 2014-10-03 LAB — TROPONIN I
Troponin I: 18.04 ng/mL — ABNORMAL HIGH (ref <0.031)
Troponin I: 18.04 ng/mL — ABNORMAL HIGH (ref <0.031)

## 2014-10-03 MED ORDER — LOPERAMIDE HCL 2 MG PO CAPS
4.0000 mg | ORAL_CAPSULE | ORAL | Status: DC | PRN
  Administered 2014-10-03 – 2014-10-05 (×5): 4 mg via ORAL
  Filled 2014-10-03 (×5): qty 2

## 2014-10-03 MED ORDER — CLOPIDOGREL BISULFATE 75 MG PO TABS
75.0000 mg | ORAL_TABLET | Freq: Every day | ORAL | Status: DC
  Administered 2014-10-03 – 2014-10-05 (×3): 75 mg via ORAL
  Filled 2014-10-03 (×3): qty 1

## 2014-10-03 MED ORDER — HEPARIN BOLUS VIA INFUSION
4000.0000 [IU] | Freq: Once | INTRAVENOUS | Status: AC
  Administered 2014-10-03: 4000 [IU] via INTRAVENOUS
  Filled 2014-10-03: qty 4000

## 2014-10-03 MED ORDER — INSULIN GLARGINE 100 UNIT/ML ~~LOC~~ SOLN: 50.0000 [IU] | Freq: Every morning | SUBCUTANEOUS | Status: DC

## 2014-10-03 MED ORDER — HEPARIN (PORCINE) IN NACL 100-0.45 UNIT/ML-% IJ SOLN
850.0000 [IU]/h | INTRAMUSCULAR | Status: DC
  Administered 2014-10-03 – 2014-10-04 (×2): 850 [IU]/h via INTRAVENOUS
  Filled 2014-10-03 (×3): qty 250

## 2014-10-03 NOTE — Plan of Care (Signed)
Individualization of Care Pt prefers to be called Savannah Key Hx of GERD, HTN, MI  Problem: Discharge Progression Outcomes Goal: Other Discharge Outcomes/Goals Outcome: Progressing Plan of care progress to goal for:  1. Pain:  Patient had no complaints of pain this shift.  2. Hemodynamically:  Patient afebrile and VSS this shift.  Patient received dialysis on day shift and 2L of fluid was removed.  BP 101/40 mmHg  Pulse 79  Temp(Src) 99.4 F (37.4 C) (Oral)  Resp 18  Ht 5' (1.524 m)  Wt 189 lb 2.5 oz (85.8 kg)  BMI 36.94 kg/m2  SpO2 93% 3. Complications:  Patient did return from dialysis exhausted and slept majority of shift. 4. Diet:  Patient refused dinner after return from dialysis.  She stated that she had no appetite and was too tired and just wanted to sleep 5. Activity:  Patient remained in bed throughout shift.

## 2014-10-03 NOTE — Progress Notes (Signed)
Pt transferred to 2a via bed per orders

## 2014-10-03 NOTE — Progress Notes (Signed)
HD tx start 

## 2014-10-03 NOTE — Care Management Important Message (Signed)
Important Message  Patient Details  Name: Savannah Key MRN: [id number] Date of Birth: 01-May-1940   Medicare Important Message Given:  Yes-second notification given    [name] 10/03/2014, 9:06 AM

## 2014-10-03 NOTE — Progress Notes (Signed)
Pt. Transferred to 2A room 237. Report received from 1C RN, Lambert Mody. Assessment and VSS WNL, no complaints of any pain. Telemetry box placed. Heparin gtt will be started once it is received from pharmacy.   Filed Vitals:   10/03/14 1155  BP: 112/41  Pulse: 79  Temp: 98.2 F (36.8 C)  Resp: 18   Joycelyn Man RN 12:08 PM 10/03/2014

## 2014-10-03 NOTE — Progress Notes (Addendum)
Dr. Elpidio Anis notified of troponin of 18.04, pt with diarrhea x 3, cdiff specimen ordered, pt to transfer to 2a per orders, pt reports that she will call her family, stat EKG ordered, pt placed on telemetry per orders, pt without distress.  Dr. Elpidio Anis into see. Watson,Tracie I, RN 10/03/2014 11:17 AM Pt noted with negative cdiff test on 09-30-14 and pt not on antibiotics at this time. Dr. Elpidio Anis notified, ordered to not test for [facility]

## 2014-10-03 NOTE — Progress Notes (Signed)
Handout and verbal education provided on plavix with patients first dose.

## 2014-10-03 NOTE — Progress Notes (Signed)
Report Called to [facility] RN on 2a, pt to transfer to 2a r/t increased troponin level. Casimer Leek, RN 10/03/2014 11:33 AM

## 2014-10-03 NOTE — Progress Notes (Signed)
ANTICOAGULATION CONSULT NOTE - Initial Consult  Pharmacy Consult for Heparin  Indication: chest pain/ACS  Allergies  Allergen Reactions  . Sulfa Antibiotics Other (See Comments)    caused bloody urine    Patient Measurements: Height: 5' (152.4 cm) Weight: 187 lb 11.2 oz (85.14 kg) IBW/kg (Calculated) : 45.5 Heparin Dosing Weight: 69.7 kg  Vital Signs: Temp: 98.3 F (36.8 C) (09/14 2022) Temp Source: Oral (09/14 2022) BP: 104/38 mmHg (09/14 2022) Pulse Rate: 79 (09/14 2022)  Labs:  Recent Labs  10/01/14 0439 10/03/14 0820 10/03/14 1609 10/03/14 2008  HGB 11.3* 10.0*  --   --   HCT 36.3 31.6*  --   --   PLT 131* 101*  --   --   APTT 29  --   --   --   LABPROT 14.8  --   --   --   INR 1.14  --   --   --   HEPARINUNFRC  --   --   --  0.47  CREATININE 4.07*  --   --   --   TROPONINI 0.22* 18.04* 18.04*  --     Estimated Creatinine Clearance: 11.9 mL/min (by C-G formula based on Cr of 4.07).   Medical History: Past Medical History  Diagnosis Date  . Chronic kidney disease   . GERD (gastroesophageal reflux disease)   . Carotid artery stenosis   . Coronary artery disease   . Hypothyroidism   . Diabetes mellitus without complication   . Depression   . Arthritis   . Kidney failure   . Liver failure   . MI (myocardial infarction)   . HTN (hypertension)   . Broken leg     R    Medications:  Scheduled:  . aspirin EC  81 mg Oral Daily  . atorvastatin  80 mg Oral QHS  . bisacodyl  5 mg Oral QHS  . calcium acetate  667 mg Oral TID WC  . calcium carbonate  1 tablet Oral TID  . cephALEXin  250 mg Oral QHS  . citalopram  10 mg Oral Daily  . clopidogrel  75 mg Oral Daily  . docusate sodium  100 mg Oral BID  . epoetin (EPOGEN/PROCRIT) injection  4,000 Units Intravenous Q M,W,F-HD  . feeding supplement (NEPRO CARB STEADY)  237 mL Oral TID BM  . ferrous fumarate  1 tablet Oral Daily  . insulin aspart  0-10 Units Subcutaneous TID WC & HS  . insulin glargine  40  Units Subcutaneous BID  . isosorbide mononitrate  60 mg Oral QAC breakfast  . levothyroxine  75 mcg Oral QAC breakfast  . lidocaine  1 patch Transdermal Q24H  . loratadine  10 mg Oral Daily  . nadolol  40 mg Oral Daily  . pantoprazole  40 mg Oral Daily  . polyethylene glycol  17 g Oral QODAY  . pregabalin  75 mg Oral QHS  . rOPINIRole  2 mg Oral QHS  . sodium chloride  3 mL Intravenous Q12H  . traZODone  50 mg Oral QHS  . trolamine salicylate  1 application Topical Q3 days  . venlafaxine  37.5 mg Oral QHS  . [START ON 10/05/2014] Vitamin D (Ergocalciferol)  50,000 Units Oral Weekly   Infusions:  . heparin 850 Units/hr (10/03/14 1230)    Assessment: Savannah Key is a 74 yo female admitted for ARF found to have elevated troponins. Pharmacy is consulted to dose heparin for ACS.   Goal of Therapy:  Heparin  level 0.3-0.7 units/ml Monitor platelets by anticoagulation protocol: Yes   Plan:  Give 4000 units bolus x 1 Start heparin infusion at 850 units/hr Check anti-Xa level in 8 hours and daily while on heparin Continue to monitor H&H and platelets   9/14:  HL @ 20:00 = 0.47 Will draw confirmation HL on 9/15 @ 4:00.   Pharmacy will continue to monitor.  Gid Schoffstall D 10/03/2014,10:12 PM

## 2014-10-03 NOTE — Progress Notes (Signed)
[facility] - [place] at [facility]   PATIENT NAME: Savannah Key    MR#:  [id number]  DATE OF BIRTH:  [birth year]  SUBJECTIVE:  CHIEF COMPLAINT:   Chief Complaint  Patient presents with  . Shortness of Breath  . Chest Pain   Came with SOB and pulmonary edema, she is on HD as out pt. No clear etiology for fluid overload with HD compliance. Feels better. Had CP on admission. HD due today again  REVIEW OF SYSTEMS:  CONSTITUTIONAL: No fever, fatigue or weakness.  EYES: No blurred or double vision.  EARS, NOSE, AND THROAT: No tinnitus or ear pain.  RESPIRATORY: No cough,positive for shortness of breath, wheezing or hemoptysis.  CARDIOVASCULAR: No chest pain, orthopnea, edema.  GASTROINTESTINAL: No nausea, vomiting, diarrhea or abdominal pain.  GENITOURINARY: No dysuria, hematuria.  ENDOCRINE: No polyuria, nocturia,  HEMATOLOGY: No anemia, easy bruising or bleeding SKIN: No rash or lesion. MUSCULOSKELETAL: No joint pain or arthritis.   NEUROLOGIC: No tingling, numbness, weakness.  PSYCHIATRY: No anxiety or depression.   ROS  DRUG ALLERGIES:   Allergies  Allergen Reactions  . Sulfa Antibiotics Other (See Comments)    caused bloody urine    VITALS:  Blood pressure 104/41, pulse 61, temperature 98.1 F (36.7 C), temperature source Oral, resp. rate 20, height 5' (1.524 m), weight 87.227 kg (192 lb 4.8 oz), SpO2 94 %.  PHYSICAL EXAMINATION:  GENERAL:  74 y.o.-year-old patient lying in the bed with no acute distress.  EYES: Pupils equal, round, reactive to light and accommodation. No scleral icterus. Extraocular muscles intact.  HEENT: Head atraumatic, normocephalic. Oropharynx and nasopharynx clear.  NECK:  Supple, no jugular venous distention. No thyroid enlargement, no tenderness.  LUNGS: Normal breath sounds bilaterally, no wheezing, mild crepitation. No use of accessory muscles of respiration. Uses supplemental oxygen. CARDIOVASCULAR: S1, S2  normal. No murmurs, rubs, or gallops.  ABDOMEN: Soft, nontender, nondistended. Bowel sounds present. No organomegaly or mass.  EXTREMITIES: mild pedal edema, no cyanosis, or clubbing.  NEUROLOGIC: Cranial nerves II through XII are intact. Muscle strength 5/5 in all extremities. Sensation intact. Gait not checked.  PSYCHIATRIC: The patient is alert and oriented x 3.  SKIN: No obvious rash, lesion, or ulcer.   Physical Exam LABORATORY PANEL:   CBC  Recent Labs Lab 10/01/14 0439  WBC 11.9*  HGB 11.3*  HCT 36.3  PLT 131*   ------------------------------------------------------------------------------------------------------------------  Chemistries   Recent Labs Lab 10/01/14 0439  NA 139  K 4.4  CL 104  CO2 24  GLUCOSE 394*  BUN 52*  CREATININE 4.07*  CALCIUM 9.8  AST 45*  ALT 29  ALKPHOS 146*  BILITOT 0.8   ------------------------------------------------------------------------------------------------------------------  Cardiac Enzymes  Recent Labs Lab 10/01/14 0439 10/03/14 0820  TROPONINI 0.22* 18.04*   ------------------------------------------------------------------------------------------------------------------  RADIOLOGY:  No results found.  ASSESSMENT AND PLAN:   This 74 year old Caucasian female with end-stage renal disease admitted for respiratory distress secondary to fluid overload.  * NSTEMI Troponin has increased to 18. Troponin on admission was 0.22. Patient did have chest pain on admission. Her cardiac insult was likely 2 days prior. Has been taking aspirin. We will start her on a heparin drip. Discussed case with Dr. Adrienne Mocha Echocardiogram has been done and report pending. Will likely need cardiac catheterization.  STAT EKG  * Acute hypoxic respiratory failure due to pulmonary edema. Has resolved with dialysis. Now on room air.  * Diabetes mellitus type 2: Continue basal insulin (the patient takes twice a  day Lantus). Started on  sliding scale insulin while hospitalized  * End-stage renal disease: HD per nephrology.  * Hypothyroidism: Continue Synthroid  * Depression: Continue venlafaxine, Celexa and trazodone  * GI prophylaxis: Pantoprazole  Transfer to 2A  All the records are reviewed and case discussed with Care Management/Social Workerr. Management plans discussed with the patient and they are in agreement.  CODE STATUS: full  TOTAL CC TIME TAKING CARE OF THIS PATIENT: 40 minutes.   POSSIBLE D/C IN 1-2 DAYS, DEPENDING ON CLINICAL CONDITION.   Milagros Loll R M.D on 10/03/2014   Between 7am to 6pm - Pager - [phone]  After 6pm go to [email or web address] - password [email or web address] [facility]  [facility]  Office  [phone]  CC: Primary care physician; Bobbye Morton, MD

## 2014-10-03 NOTE — Discharge Instructions (Signed)
°  DIET:  °Renal diet ° °DISCHARGE CONDITION:  °Stable ° °ACTIVITY:  °Activity as tolerated ° °OXYGEN:  °Home Oxygen: No. °  °Oxygen Delivery: room air ° °DISCHARGE LOCATION:  °home  ° °If you experience worsening of your admission symptoms, develop shortness of breath, life threatening emergency, suicidal or homicidal thoughts you must seek medical attention immediately by calling 911 or calling your MD immediately  if symptoms less severe. ° °You Must read complete instructions/literature along with all the possible adverse reactions/side effects for all the Medicines you take and that have been prescribed to you. Take any new Medicines after you have completely understood and accpet all the possible adverse reactions/side effects.  ° °Please note ° °You were cared for by a hospitalist during your hospital stay. If you have any questions about your discharge medications or the care you received while you were in the hospital after you are discharged, you can call the unit and asked to speak with the hospitalist on call if the hospitalist that took care of you is not available. Once you are discharged, your primary care physician will handle any further medical issues. Please note that NO REFILLS for any discharge medications will be authorized once you are discharged, as it is imperative that you return to your primary care physician (or establish a relationship with a primary care physician if you do not have one) for your aftercare needs so that they can reassess your need for medications and monitor your lab values. ° ° ° °

## 2014-10-03 NOTE — Consult Note (Signed)
[facility]  CARDIOLOGY CONSULT NOTE  Patient ID: Savannah Key MRN: [id number] DOB/AGE: Mar 14, 1940 74 y.o.  Admit date: 10/01/2014 Referring Physician Sud[facility] Primary Physician Primary Cardiologist Arnoldo Hooker M.D. Reason for Consultation non-STEMI  HPI: 74 year old female referred for evaluation chest pain. Patient has known coronary artery disease. Patient has end-stage renal disease on chronic hemodialysis. She was admitted on 10/01/14 for chest pain and respiratory failure with hypoxia requiring BiPAP. Patient reports one to two-week history of intermittent episodes of chest discomfort. This is typically relieved with sublingual nitroglycerin. She has known coronary artery disease, status post prior MI in coronary stents. Most recent cardiac catheterization 10/06/10 revealed severe, diffuse three-vessel coronary artery disease not felt to be amenable to PCI or bypass surgery. The patient has ruled in for non-ST elevation myocardial infarction with troponin of 18.04. Follow-up troponin was also 18.04. She currently receiving dialysis, denies chest pain.  Review of systems complete and found to be negative unless listed above     Past Medical History  Diagnosis Date  . Chronic kidney disease   . GERD (gastroesophageal reflux disease)   . Carotid artery stenosis   . Coronary artery disease   . Hypothyroidism   . Diabetes mellitus without complication   . Depression   . Arthritis   . Kidney failure   . Liver failure   . MI (myocardial infarction)   . HTN (hypertension)   . Broken leg     R    Past Surgical History  Procedure Laterality Date  . Av fistula placement Left   . Cholecystectomy    . Peripheral vascular catheterization N/A 06/11/2014    Procedure: A/V Shuntogram/Fistulagram;  Surgeon: Annice Needy, MD;  Location: [facility] INVASIVE CV LAB;  Service: Cardiovascular;  Laterality: N/A;  . Peripheral vascular catheterization Left 06/11/2014    Procedure: A/V Shunt  Intervention;  Surgeon: Annice Needy, MD;  Location: [facility] INVASIVE CV LAB;  Service: Cardiovascular;  Laterality: Left;  . Open reduction internal fixation (orif) distal radial fracture Left 08/09/2014    Procedure: OPEN REDUCTION INTERNAL FIXATION (ORIF) DISTAL RADIAL FRACTURE;  Surgeon: Kennedy Bucker, MD;  Location: [facility] ORS;  Service: Orthopedics;  Laterality: Left;    Prescriptions prior to admission  Medication Sig Dispense Refill Last Dose  . aspirin EC 81 MG tablet Take by mouth daily.   09/30/2014 at Unknown time  . atorvastatin (LIPITOR) 20 MG tablet Take 80 mg by mouth at bedtime.    09/30/2014 at Unknown time  . bisacodyl (DULCOLAX) 5 MG EC tablet Take 5 mg by mouth at bedtime.    09/30/2014 at Unknown time  . calcium acetate (PHOSLO) 667 MG capsule Take 667 mg by mouth 3 (three) times daily with meals.   09/30/2014 at Unknown time  . calcium carbonate (TUMS - DOSED IN MG ELEMENTAL CALCIUM) 500 MG chewable tablet Chew 1 tablet by mouth 3 (three) times daily.   09/30/2014 at Unknown time  . citalopram (CELEXA) 10 MG tablet Take 10 mg by mouth daily.   09/30/2014 at Unknown time  . ergocalciferol (VITAMIN D2) 50000 UNITS capsule Take 50,000 Units by mouth once a week.   09/30/2014 at Unknown time  . famotidine (PEPCID) 20 MG tablet Take 20 mg by mouth daily with breakfast.    Past Week at Unknown time  . ferrous fumarate (HEMOCYTE - 106 MG FE) 325 (106 FE) MG TABS tablet Take 1 tablet by mouth.   09/30/2014 at Unknown time  . hydrOXYzine (ATARAX/VISTARIL) 10  MG tablet Take 10 mg by mouth 3 (three) times daily as needed.   09/30/2014 at Unknown time  . insulin glargine (LANTUS) 100 UNIT/ML injection Inject 50 Units into the skin daily before supper.   Past Week at Unknown time  . insulin NPH Human (HUMULIN N,NOVOLIN N) 100 UNIT/ML injection Inject 15 Units into the skin 3 (three) times daily before meals.    Past Week at Unknown time  . isosorbide mononitrate (IMDUR) 60 MG 24 hr tablet Take 60 mg by  mouth daily before breakfast.   09/30/2014 at Unknown time  . levothyroxine (SYNTHROID, LEVOTHROID) 100 MCG tablet Take 75 mcg by mouth daily before breakfast.    09/30/2014 at Unknown time  . lidocaine (LIDODERM) 5 % Place 1 patch onto the skin daily. Remove & Discard patch within 12 hours or as directed by MD   09/30/2014 at Unknown time  . nadolol (CORGARD) 40 MG tablet Take 40 mg by mouth daily.   09/30/2014 at Unknown time  . Nutritional Supplements (FEEDING SUPPLEMENT, NEPRO CARB STEADY,) LIQD Take 237 mLs by mouth 3 (three) times daily between meals.   09/30/2014 at Unknown time  . omeprazole (PRILOSEC) 20 MG capsule Take 20 mg by mouth daily.   09/30/2014 at Unknown time  . oxyCODONE-acetaminophen (ROXICET) 5-325 MG per tablet Take 1 tablet by mouth every 8 (eight) hours as needed for moderate pain or severe pain (Do not drive or operate heavy machinery while taking as can cause drowsiness.). 9 tablet 0 Past Week at Unknown time  . polyethylene glycol (MIRALAX / GLYCOLAX) packet Take 17 g by mouth every other day. Mix in 4 to 8 ounces of fluid before taking.   Past Week at Unknown time  . pregabalin (LYRICA) 50 MG capsule Take 75 mg by mouth at bedtime.    Past Week at Unknown time  . rOPINIRole (REQUIP) 2 MG tablet Take 2 mg by mouth at bedtime.   Past Week at Unknown time  . sodium chloride (OCEAN) 0.65 % SOLN nasal spray Place 1 spray into both nostrils as needed for congestion.   Past Week at Unknown time  . trolamine salicylate (ASPERCREME) 10 % cream Apply 1 application topically every 3 (three) days.   Past Month at Unknown time  . Vaginal Lubricant (REPLENS) GEL Place 1 application vaginally as needed.   Past Week at Unknown time  . venlafaxine (EFFEXOR) 37.5 MG tablet Take 37.5 mg by mouth at bedtime.    Past Week at Unknown time  . [DISCONTINUED] insulin glargine (LANTUS) 100 UNIT/ML injection Inject 90 Units into the skin every morning.    Past Week at Unknown time  . cephALEXin (KEFLEX)  250 MG capsule Take 250 mg by mouth at bedtime.    Not Taking at Unknown time  . cetirizine (ZYRTEC) 5 MG tablet Take 5 mg by mouth at bedtime.    Not Taking at Unknown time  . mineral oil-hydrophilic petrolatum (AQUAPHOR) ointment Apply 1 application topically as needed for dry skin.   Not Taking at Unknown time  . pantoprazole (PROTONIX) 40 MG tablet Take 40 mg by mouth 2 (two) times daily.   Not Taking at Unknown time  . selenium sulfide (SELSUN) 2.5 % shampoo Apply 1 application topically every 7 (seven) weeks.   Not Taking at Unknown time  . traZODone (DESYREL) 50 MG tablet Take 50 mg by mouth at bedtime.   Not Taking at Unknown time  . triamcinolone cream (KENALOG) 0.1 % Apply 1  application topically daily as needed.   Not Taking at Unknown time   Social History   Social History  . Marital Status: Married    Spouse Name: N/A  . Number of Children: N/A  . Years of Education: N/A   Occupational History  . Not on file.   Social History Main Topics  . Smoking status: Never Smoker   . Smokeless tobacco: Not on file  . Alcohol Use: No  . Drug Use: No  . Sexual Activity: Not on file   Other Topics Concern  . Not on file   Social History Narrative    History reviewed. No pertinent family history.    Review of systems complete and found to be negative unless listed above      PHYSICAL EXAM  General: Well developed, well nourished, in no acute distress HEENT:  Normocephalic and atramatic Neck:  No JVD.  Lungs: Clear bilaterally to auscultation and percussion. Heart: HRRR . Normal S1 and S2 without gallops or murmurs.  Abdomen: Bowel sounds are positive, abdomen soft and non-tender  Msk:  Back normal, normal gait. Normal strength and tone for age. Extremities: No clubbing, cyanosis or edema.   Neuro: Alert and oriented X 3. Psych:  Good affect, responds appropriately  Labs:   Lab Results  Component Value Date   WBC 6.8 10/03/2014   HGB 10.0* 10/03/2014   HCT 31.6*  10/03/2014   MCV 82.5 10/03/2014   PLT 101* 10/03/2014    Recent Labs Lab 10/01/14 0439  NA 139  K 4.4  CL 104  CO2 24  BUN 52*  CREATININE 4.07*  CALCIUM 9.8  PROT 6.9  BILITOT 0.8  ALKPHOS 146*  ALT 29  AST 45*  GLUCOSE 394*   Lab Results  Component Value Date   TROPONINI 18.04* 10/03/2014    Lab Results  Component Value Date   CHOL 102 03/07/2013   Lab Results  Component Value Date   HDL 43 03/07/2013   Lab Results  Component Value Date   LDLCALC 33 03/07/2013   Lab Results  Component Value Date   TRIG 128 03/07/2013   No results found for: CHOLHDL No results found for: LDLDIRECT    Radiology: Dg Chest Portable 1 View  10/01/2014   CLINICAL DATA:  Shortness of breath and chest pain, symptoms for 6 hours.  EXAM: PORTABLE CHEST - 1 VIEW  COMPARISON:  08/11/2014  FINDINGS: The heart is enlarged, similar to prior. There is vascular congestion and perihilar edema. Probable bilateral pleural effusions. Retrocardiac opacity, likely atelectasis. No pneumothorax.  IMPRESSION: Findings suggest CHF.   Electronically Signed   By: Rubye Oaks M.D.   On: 10/01/2014 05:56    EKG: Nondiagnostic  ASSESSMENT AND PLAN:   1. Non-ST elevation myocardial infarction with elevated troponin. Patient does have intermittent episodes of chest discomfort. EKG is nondiagnostic. 2. Known severe, diffuse, three-vessel coronary artery disease not felt to be amenable to PCI or bypass graft surgery by previous cardiac catheterization 10/06/10 3. Respiratory failure, likely multifactorial, secondary to acute on chronic systolic congestive heart failure, with end-stage renal disease on chronic hemodialysis  Recommendations  1. Based on previous cardiac catheterization results, proceed conservatively 2. Continue heparin drip 24-48 hours 3. Consider adding Plavix 75 mg daily 4. Continue current antianginal therapy 5. Consider adding Ranexa 500 mg twice a day for persistent chest  pain 6. Defer cardiac catheterization at this time  Signed: Ermalee Mealy MD,PhD, [facility] 10/03/2014, 5:25 PM

## 2014-10-03 NOTE — Care Management Note (Signed)
Patient is active at [facility] M[facility]  Ivor Reining Dialysis Liaison [phone]

## 2014-10-03 NOTE — Progress Notes (Signed)
ANTICOAGULATION CONSULT NOTE - Initial Consult  Pharmacy Consult for Heparin  Indication: chest pain/ACS  Allergies  Allergen Reactions  . Sulfa Antibiotics Other (See Comments)    caused bloody urine    Patient Measurements: Height: 5' (152.4 cm) Weight: 192 lb 4.8 oz (87.227 kg) IBW/kg (Calculated) : 45.5 Heparin Dosing Weight: 69.7 kg  Vital Signs: Temp: 98.1 F (36.7 C) (09/14 0453) Temp Source: Oral (09/14 0453) BP: 104/41 mmHg (09/14 1015) Pulse Rate: 61 (09/14 1015)  Labs:  Recent Labs  10/01/14 0439 10/03/14 0820  HGB 11.3*  --   HCT 36.3  --   PLT 131*  --   APTT 29  --   LABPROT 14.8  --   INR 1.14  --   CREATININE 4.07*  --   TROPONINI 0.22* 18.04*    Estimated Creatinine Clearance: 12.1 mL/min (by C-G formula based on Cr of 4.07).   Medical History: Past Medical History  Diagnosis Date  . Chronic kidney disease   . GERD (gastroesophageal reflux disease)   . Carotid artery stenosis   . Coronary artery disease   . Hypothyroidism   . Diabetes mellitus without complication   . Depression   . Arthritis   . Kidney failure   . Liver failure   . MI (myocardial infarction)   . HTN (hypertension)   . Broken leg     R    Medications:  Scheduled:  . aspirin EC  81 mg Oral Daily  . atorvastatin  80 mg Oral QHS  . bisacodyl  5 mg Oral QHS  . calcium acetate  667 mg Oral TID WC  . calcium carbonate  1 tablet Oral TID  . cephALEXin  250 mg Oral QHS  . citalopram  10 mg Oral Daily  . docusate sodium  100 mg Oral BID  . epoetin (EPOGEN/PROCRIT) injection  4,000 Units Intravenous Q M,W,F-HD  . feeding supplement (NEPRO CARB STEADY)  237 mL Oral TID BM  . ferrous fumarate  1 tablet Oral Daily  . heparin  4,000 Units Intravenous Once  . insulin aspart  0-10 Units Subcutaneous TID WC & HS  . insulin glargine  40 Units Subcutaneous BID  . isosorbide mononitrate  60 mg Oral QAC breakfast  . levothyroxine  75 mcg Oral QAC breakfast  . lidocaine  1  patch Transdermal Q24H  . loratadine  10 mg Oral Daily  . nadolol  40 mg Oral Daily  . nitroGLYCERIN  0.5 inch Topical 4 times per day  . pantoprazole  40 mg Oral Daily  . polyethylene glycol  17 g Oral QODAY  . pregabalin  75 mg Oral QHS  . rOPINIRole  2 mg Oral QHS  . sodium chloride  3 mL Intravenous Q12H  . traZODone  50 mg Oral QHS  . trolamine salicylate  1 application Topical Q3 days  . venlafaxine  37.5 mg Oral QHS  . [START ON 10/05/2014] Vitamin D (Ergocalciferol)  50,000 Units Oral Weekly   Infusions:  . heparin      Assessment: LS is a 74 yo female admitted for ARF found to have elevated troponins. Pharmacy is consulted to dose heparin for ACS.   Goal of Therapy:  Heparin level 0.3-0.7 units/ml Monitor platelets by anticoagulation protocol: Yes   Plan:  Give 4000 units bolus x 1 Start heparin infusion at 850 units/hr Check anti-Xa level in 8 hours and daily while on heparin Continue to monitor H&H and platelets   Pharmacy will continue  to monitor.  Cy Blamer 10/03/2014,11:29 AM

## 2014-10-03 NOTE — Progress Notes (Signed)
Subjective:   Doing well Currently on room air 2000 cc was removed with dialysis No nausea or vomiting reported Appetite is poor .  Objective:  Vital signs in last 24 hours:  Temp:  [98.1 F (36.7 C)-99.4 F (37.4 C)] 98.1 F (36.7 C) (09/14 0453) Pulse Rate:  [66-99] 99 (09/14 0456) Resp:  [18-25] 20 (09/14 0453) BP: (88-113)/(33-57) 95/44 mmHg (09/14 0456) SpO2:  [93 %-100 %] 94 % (09/14 0453) Weight:  [85.8 kg (189 lb 2.5 oz)-88.5 kg (195 lb 1.7 oz)] 87.227 kg (192 lb 4.8 oz) (09/14 0548)  Weight change: -11.2 kg (-24 lb 11.1 oz) Filed Weights   10/02/14 1015 10/02/14 1415 10/03/14 0548  Weight: 88.5 kg (195 lb 1.7 oz) 85.8 kg (189 lb 2.5 oz) 87.227 kg (192 lb 4.8 oz)    Intake/Output:    Intake/Output Summary (Last 24 hours) at 10/03/14 1014 Last data filed at 10/02/14 1814  Gross per 24 hour  Intake      0 ml  Output   2000 ml  Net  -2000 ml     Physical Exam: General:  laying in the bed, no distress   HEENT   Moist oral mucus membranes  Neck  supple   Pulm/lungs  bilateral mild crackles   CVS/Heart  no rub or gallop   Abdomen:   soft, nontender   Extremities:  1+ pitting edema bilaterally   Neurologic:  alert, able to answer questions by nodding yes/no   Skin:  some erythema over lower extremities   Access:        Basic Metabolic Panel:   Recent Labs Lab 10/01/14 0439  NA 139  K 4.4  CL 104  CO2 24  GLUCOSE 394*  BUN 52*  CREATININE 4.07*  CALCIUM 9.8     CBC:  Recent Labs Lab 10/01/14 0439  WBC 11.9*  NEUTROABS 9.8*  HGB 11.3*  HCT 36.3  MCV 84.3  PLT 131*      Microbiology:  Recent Results (from the past 720 hour(s))  Blood culture (routine x 2)     Status: None (Preliminary result)   Collection Time: 10/01/14  4:40 AM  Result Value Ref Range Status   Specimen Description BLOOD RIGHT THUMB  Final   Special Requests BOTTLES DRAWN AEROBIC AND ANAEROBIC  Final   Culture NO GROWTH 2 DAYS  Final   Report Status  PENDING  Incomplete  Blood culture (routine x 2)     Status: None (Preliminary result)   Collection Time: 10/01/14  4:40 AM  Result Value Ref Range Status   Specimen Description BLOOD RIGHT HAND  Final   Special Requests BOTTLES DRAWN AEROBIC AND ANAEROBIC  Final   Culture NO GROWTH 2 DAYS  Final   Report Status PENDING  Incomplete  MRSA PCR Screening     Status: None   Collection Time: 10/01/14  8:47 AM  Result Value Ref Range Status   MRSA by PCR NEGATIVE NEGATIVE Final    Comment:        The GeneXpert MRSA Assay (FDA approved for NASAL specimens only), is one component of a comprehensive MRSA colonization surveillance program. It is not intended to diagnose MRSA infection nor to guide or monitor treatment for MRSA infections.     Coagulation Studies:  Recent Labs  10/01/14 0439  LABPROT 14.8  INR 1.14    Urinalysis: No results for input(s): COLORURINE, LABSPEC, PHURINE, GLUCOSEU, HGBUR, BILIRUBINUR, KETONESUR, PROTEINUR, UROBILINOGEN, NITRITE, LEUKOCYTESUR in the last 72  hours.  Invalid input(s): APPERANCEUR    Imaging: No results found.   Medications:     . aspirin EC  81 mg Oral Daily  . atorvastatin  80 mg Oral QHS  . bisacodyl  5 mg Oral QHS  . calcium acetate  667 mg Oral TID WC  . calcium carbonate  1 tablet Oral TID  . cephALEXin  250 mg Oral QHS  . citalopram  10 mg Oral Daily  . docusate sodium  100 mg Oral BID  . epoetin (EPOGEN/PROCRIT) injection  4,000 Units Intravenous Q M,W,F-HD  . feeding supplement (NEPRO CARB STEADY)  237 mL Oral TID BM  . ferrous fumarate  1 tablet Oral Daily  . heparin  5,000 Units Subcutaneous 3 times per day  . insulin aspart  0-10 Units Subcutaneous TID WC & HS  . insulin glargine  40 Units Subcutaneous BID  . isosorbide mononitrate  60 mg Oral QAC breakfast  . levothyroxine  75 mcg Oral QAC breakfast  . lidocaine  1 patch Transdermal Q24H  . loratadine  10 mg Oral Daily  . nadolol  40 mg Oral Daily  .  nitroGLYCERIN  0.5 inch Topical 4 times per day  . pantoprazole  40 mg Oral Daily  . polyethylene glycol  17 g Oral QODAY  . pregabalin  75 mg Oral QHS  . rOPINIRole  2 mg Oral QHS  . sodium chloride  3 mL Intravenous Q12H  . traZODone  50 mg Oral QHS  . trolamine salicylate  1 application Topical Q3 days  . venlafaxine  37.5 mg Oral QHS  . [START ON 10/05/2014] Vitamin D (Ergocalciferol)  50,000 Units Oral Weekly   acetaminophen **OR** acetaminophen, mineral oil-hydrophilic petrolatum, ondansetron **OR** ondansetron (ZOFRAN) IV, oxyCODONE-acetaminophen, REPLENS, sodium chloride, triamcinolone cream  Assessment/ Plan:  74 y.o. female with end-stage renal disease, carotid stenosis, coronary artery disease, diabetes type 2, history of myocardial infarction, hypertension, peripheral vascular disease, admitted for acute pulmonary edema  1. End-stage renal disease. MWF. [facility]. Followed by [facility] 2. [facility] 3. [facility] 4. Acute pulmonary edema  Plan:  HD today- regular day UF as tolerated Will assess need for HD on a daily basis   LOS: 2 Edwen Mclester 9/14/201610:14 AM

## 2014-10-03 NOTE — Progress Notes (Signed)
Pt asking for something to help her diarrhea. MD paged, RN received orders for PRN immodium. RN asked about scheduled nitro paste given low BPs, and MD said to discontinue order.

## 2014-10-03 NOTE — Progress Notes (Signed)
Pre-hd tx 

## 2014-10-04 LAB — CBC
HEMATOCRIT: 29.7 % — AB (ref 35.0–47.0)
Hemoglobin: 9.6 g/dL — ABNORMAL LOW (ref 12.0–16.0)
MCH: 26.3 pg (ref 26.0–34.0)
MCHC: 32.2 g/dL (ref 32.0–36.0)
MCV: 81.8 fL (ref 80.0–100.0)
PLATELETS: 99 10*3/uL — AB (ref 150–440)
RBC: 3.63 MIL/uL — ABNORMAL LOW (ref 3.80–5.20)
RDW: 21 % — AB (ref 11.5–14.5)
WBC: 6 10*3/uL (ref 3.6–11.0)

## 2014-10-04 LAB — GLUCOSE, CAPILLARY
Glucose-Capillary: 168 mg/dL — ABNORMAL HIGH (ref 65–99)
Glucose-Capillary: 248 mg/dL — ABNORMAL HIGH (ref 65–99)
Glucose-Capillary: 237 mg/dL — ABNORMAL HIGH (ref 65–99)
Glucose-Capillary: 267 mg/dL — ABNORMAL HIGH (ref 65–99)

## 2014-10-04 LAB — HEPARIN LEVEL (UNFRACTIONATED): HEPARIN UNFRACTIONATED: 0.35 [IU]/mL (ref 0.30–0.70)

## 2014-10-04 MED ORDER — CLOPIDOGREL BISULFATE 75 MG PO TABS: 75.0000 mg | ORAL_TABLET | Freq: Every day | ORAL | Status: AC

## 2014-10-04 NOTE — Care Management (Addendum)
Patient transferred to 2A from 1C for chest pain.  Has ruled in for nstemi with troponin up to 18.4.  Currently on heparin drip.  Known CAD which is not felt to be amendable to PCI or surgical intervention based on cardiac cath from 2012 per cardiology note.  Patient is followed by [facility] and receives chronic hemodialysis.  Notified dialysis coordinator of transfer.  Darlina Rumpf with [facility]

## 2014-10-04 NOTE — Progress Notes (Signed)
[facility] -  at [facility]   PATIENT NAME: Savannah Key    MR#:  [id number]  DATE OF BIRTH:  [birth year]  SUBJECTIVE:  CHIEF COMPLAINT:   Chief Complaint  Patient presents with  . Shortness of Breath  . Chest Pain   Came with SOB and pulmonary edema, she is on HD as out pt. No clear etiology for fluid overload with HD compliance. Feels better. Had CP on admission. Patient had 3 rounds of hemodialysis this admission. Presently on heparin drip and is chest pain-free. Chronic diarrhea unchanged. Afebrile.  REVIEW OF SYSTEMS:  CONSTITUTIONAL: No fever, fatigue or weakness.  EYES: No blurred or double vision.  EARS, NOSE, AND THROAT: No tinnitus or ear pain.  RESPIRATORY: No cough,positive for shortness of breath, wheezing or hemoptysis.  CARDIOVASCULAR: No chest pain, orthopnea, edema.  GASTROINTESTINAL: No nausea, vomiting,  abdominal pain.  chronic diarrhea GENITOURINARY: No dysuria, hematuria.  ENDOCRINE: No polyuria, nocturia,  HEMATOLOGY: No anemia, easy bruising or bleeding SKIN: No rash or lesion. MUSCULOSKELETAL: No joint pain or arthritis.   NEUROLOGIC: No tingling, numbness, weakness.  PSYCHIATRY: No anxiety or depression.   ROS  DRUG ALLERGIES:   Allergies  Allergen Reactions  . Sulfa Antibiotics Other (See Comments)    caused bloody urine    VITALS:  Blood pressure 92/63, pulse 79, temperature 98.2 F (36.8 C), temperature source Oral, resp. rate 18, height 5' (1.524 m), weight 82.963 kg (182 lb 14.4 oz), SpO2 95 %.  PHYSICAL EXAMINATION:  GENERAL:  74 y.o.-year-old patient lying in the bed with no acute distress.  EYES: Pupils equal, round, reactive to light and accommodation. No scleral icterus. Extraocular muscles intact.  HEENT: Head atraumatic, normocephalic. Oropharynx and nasopharynx clear.  NECK:  Supple, no jugular venous distention. No thyroid enlargement, no tenderness.  LUNGS: Normal breath sounds bilaterally,  no wheezing, mild crepitation. No use of accessory muscles of respiration. Uses supplemental oxygen. CARDIOVASCULAR: S1, S2 normal. No murmurs, rubs, or gallops.  ABDOMEN: Soft, nontender, nondistended. Bowel sounds present. No organomegaly or mass.  EXTREMITIES: mild pedal edema, no cyanosis, or clubbing.  NEUROLOGIC: Cranial nerves II through XII are intact. Muscle strength 5/5 in all extremities. Sensation intact. Gait not checked.  PSYCHIATRIC: The patient is alert and oriented x 3.  SKIN: No obvious rash, lesion, or ulcer.   Physical Exam LABORATORY PANEL:   CBC  Recent Labs Lab 10/04/14 0402  WBC 6.0  HGB 9.6*  HCT 29.7*  PLT 99*   ------------------------------------------------------------------------------------------------------------------  Chemistries   Recent Labs Lab 10/01/14 0439  NA 139  K 4.4  CL 104  CO2 24  GLUCOSE 394*  BUN 52*  CREATININE 4.07*  CALCIUM 9.8  AST 45*  ALT 29  ALKPHOS 146*  BILITOT 0.8   ------------------------------------------------------------------------------------------------------------------  Cardiac Enzymes  Recent Labs Lab 10/03/14 0820 10/03/14 1609  TROPONINI 18.04* 18.04*   ------------------------------------------------------------------------------------------------------------------  RADIOLOGY:  No results found.  ASSESSMENT AND PLAN:   This 74 year old Caucasian female with end-stage renal disease admitted for respiratory distress secondary to fluid overload.  * NSTEMI Troponin has increased to 18. Troponin on admission was 0.22. Patient did have chest pain on admission. Her cardiac insult was likely 2 days priopatient is on aspirin, statin, beta blocker. Plavix Appreciate cardiology input. Continue heparin for and at the day. Patient's last cardiac catheterization was not amenable to intervention. And advised medical management by cardiology. Will discharge patient home tomorrow after dialysis with  optimizing patient of medical treatment for  CAD.  * Acute hypoxic respiratory failure due to pulmonary edema. Has resolved with dialysis. Now on room air.  * Diabetes mellitus type 2: Continue basal insulin (the patient takes twice a day Lantus). Started on sliding scale insulin while hospitalized  * End-stage renal disease: HD per nephrology.  * Hypothyroidism: Continue Synthroid  * Depression: Continue venlafaxine, Celexa and trazodone  * GI prophylaxis: Pantoprazole  * Anemia of chronic disease is stable   All the records are reviewed and case discussed with Care Management/Social Workerr. Management plans discussed with the patient and they are in agreement.  CODE STATUS: full  TOTAL TIME TAKING CARE OF THIS PATIENT: 40 minutes.    DISCHARGE TOMORROW AFTER DIALYSIS.  Milagros Loll R M.D on 10/04/2014   Between 7am to 6pm - Pager - [phone]  After 6pm go to [email or web address] - password [email or web address] [facility]  [facility]  Office  [phone]  CC: Primary care physician; Bobbye Morton, MD

## 2014-10-04 NOTE — Progress Notes (Signed)
[facility]  SUBJECTIVE: I don't have chest pain   Filed Vitals:   10/03/14 1856 10/03/14 2022 10/04/14 0500 10/04/14 1119  BP: 85/52 104/38 92/63 108/47  Pulse: 92 79  71  Temp:  98.3 F (36.8 C) 98.2 F (36.8 C) 98.5 F (36.9 C)  TempSrc:  Oral Oral Oral  Resp:  18  17  Height:      Weight:   82.963 kg (182 lb 14.4 oz)   SpO2:  96% 95% 98%     Intake/Output Summary (Last 24 hours) at 10/04/14 1701 Last data filed at 10/04/14 1655  Gross per 24 hour  Intake  61.48 ml  Output   1020 ml  Net -958.52 ml      PHYSICAL EXAM  General: Well developed, well nourished, in no acute distress HEENT:  Normocephalic and atramatic Neck:  No JVD.  Lungs: Clear bilaterally to auscultation and percussion. Heart: HRRR . Normal S1 and S2 without gallops or murmurs.  Abdomen: Bowel sounds are positive, abdomen soft and non-tender  Msk:  Back normal, normal gait. Normal strength and tone for age. Extremities: No clubbing, cyanosis or edema.   Neuro: Alert and oriented X 3. Psych:  Good affect, responds appropriately   LABS: Basic Metabolic Panel: No results for input(s): NA, K, CL, CO2, GLUCOSE, BUN, CREATININE, CALCIUM, MG, PHOS in the last 72 hours. Liver Function Tests: No results for input(s): AST, ALT, ALKPHOS, BILITOT, PROT, ALBUMIN in the last 72 hours. No results for input(s): LIPASE, AMYLASE in the last 72 hours. CBC:  Recent Labs  10/03/14 0820 10/04/14 0402  WBC 6.8 6.0  HGB 10.0* 9.6*  HCT 31.6* 29.7*  MCV 82.5 81.8  PLT 101* 99*   Cardiac Enzymes:  Recent Labs  10/03/14 0820 10/03/14 1609  TROPONINI 18.04* 18.04*   BNP: Invalid input(s): POCBNP D-Dimer: No results for input(s): DDIMER in the last 72 hours. Hemoglobin A1C: No results for input(s): HGBA1C in the last 72 hours. Fasting Lipid Panel: No results for input(s): CHOL, HDL, LDLCALC, TRIG, CHOLHDL, LDLDIRECT in the last 72 hours. Thyroid Function Tests: No results for input(s): TSH,  T4TOTAL, T3FREE, THYROIDAB in the last 72 hours.  Invalid input(s): FREET3 Anemia Panel: No results for input(s): VITAMINB12, FOLATE, FERRITIN, TIBC, IRON, RETICCTPCT in the last 72 hours.  No results found.   Echo LV EF 45-50%  TELEMETRY: Sinus rhythm:  ASSESSMENT AND PLAN:  Active Problems:   Respiratory failure with hypoxia   CHF (congestive heart failure)    1. Non-STEMI, no recurrent chest pain 2. Known severe three-vessel coronary artery disease by cardiac catheterization 10/06/10, not felt to be amenable to PCI or bypass graft surgery 3. Respiratory failure, multifactorial, secondary to acute on chronic systolic congestive heart failure, and end-stage renal disease on chronic hemodialysis 4. End-stage renal disease on chronic HD  Recommendations  1. DC heparin 2. Continue aspirin and Plavix 3. Defer repeat cardiac catheterization at this time 4. Follow-up with Dr. Gwen Pounds as outpatient  Signed off for now, please call if any questions   Kaydie Petsch, MD, PhD, [facility] 10/04/2014 5:01 PM

## 2014-10-04 NOTE — Progress Notes (Signed)
ANTICOAGULATION CONSULT NOTE - Initial Consult  Pharmacy Consult for Heparin  Indication: chest pain/ACS  Allergies  Allergen Reactions  . Sulfa Antibiotics Other (See Comments)    caused bloody urine    Patient Measurements: Height: 5' (152.4 cm) Weight: 187 lb 11.2 oz (85.14 kg) IBW/kg (Calculated) : 45.5 Heparin Dosing Weight: 69.7 kg  Vital Signs: Temp: 98.3 F (36.8 C) (09/14 2022) Temp Source: Oral (09/14 2022) BP: 104/38 mmHg (09/14 2022) Pulse Rate: 79 (09/14 2022)  Labs:  Recent Labs  10/03/14 0820 10/03/14 1609 10/03/14 2008 10/04/14 0402  HGB 10.0*  --   --  9.6*  HCT 31.6*  --   --  29.7*  PLT 101*  --   --  99*  HEPARINUNFRC  --   --  0.47 0.35  TROPONINI 18.04* 18.04*  --   --     Estimated Creatinine Clearance: 11.9 mL/min (by C-G formula based on Cr of 4.07).   Medical History: Past Medical History  Diagnosis Date  . Chronic kidney disease   . GERD (gastroesophageal reflux disease)   . Carotid artery stenosis   . Coronary artery disease   . Hypothyroidism   . Diabetes mellitus without complication   . Depression   . Arthritis   . Kidney failure   . Liver failure   . MI (myocardial infarction)   . HTN (hypertension)   . Broken leg     R    Medications:  Scheduled:  . aspirin EC  81 mg Oral Daily  . atorvastatin  80 mg Oral QHS  . bisacodyl  5 mg Oral QHS  . calcium acetate  667 mg Oral TID WC  . calcium carbonate  1 tablet Oral TID  . cephALEXin  250 mg Oral QHS  . citalopram  10 mg Oral Daily  . clopidogrel  75 mg Oral Daily  . docusate sodium  100 mg Oral BID  . epoetin (EPOGEN/PROCRIT) injection  4,000 Units Intravenous Q M,W,F-HD  . feeding supplement (NEPRO CARB STEADY)  237 mL Oral TID BM  . ferrous fumarate  1 tablet Oral Daily  . insulin aspart  0-10 Units Subcutaneous TID WC & HS  . insulin glargine  40 Units Subcutaneous BID  . isosorbide mononitrate  60 mg Oral QAC breakfast  . levothyroxine  75 mcg Oral QAC  breakfast  . lidocaine  1 patch Transdermal Q24H  . loratadine  10 mg Oral Daily  . nadolol  40 mg Oral Daily  . pantoprazole  40 mg Oral Daily  . polyethylene glycol  17 g Oral QODAY  . pregabalin  75 mg Oral QHS  . rOPINIRole  2 mg Oral QHS  . sodium chloride  3 mL Intravenous Q12H  . traZODone  50 mg Oral QHS  . trolamine salicylate  1 application Topical Q3 days  . venlafaxine  37.5 mg Oral QHS  . [START ON 10/05/2014] Vitamin D (Ergocalciferol)  50,000 Units Oral Weekly   Infusions:  . heparin 850 Units/hr (10/03/14 1230)    Assessment: LS is a 74 yo female admitted for ARF found to have elevated troponins. Pharmacy is consulted to dose heparin for ACS.   Goal of Therapy:  Heparin level 0.3-0.7 units/ml Monitor platelets by anticoagulation protocol: Yes   Plan:  Give 4000 units bolus x 1 Start heparin infusion at 850 units/hr Check anti-Xa level in 8 hours and daily while on heparin Continue to monitor H&H and platelets   9/14:  HL @  20:00 = 0.47 Will draw confirmation HL on 9/15 @ 4:00.   0915 0402 HL therapeutic. Will monitor daily.  Pharmacy will continue to monitor.  Carola Frost, Pharm.D. Clinical Pharmacist 10/04/2014,5:05 AM

## 2014-10-04 NOTE — Progress Notes (Signed)
Subjective:   Doing well Reports continued diarrhea Had NTSEMI  Currently on heparin drip  Objective:  Vital signs in last 24 hours:  Temp:  [98 F (36.7 C)-98.6 F (37 C)] 98.2 F (36.8 C) (09/15 0500) Pulse Rate:  [79-101] 79 (09/14 2022) Resp:  [17-23] 18 (09/14 2022) BP: (85-125)/(36-110) 92/63 mmHg (09/15 0500) SpO2:  [95 %-99 %] 95 % (09/15 0500) Weight:  [82.963 kg (182 lb 14.4 oz)-85.8 kg (189 lb 2.5 oz)] 82.963 kg (182 lb 14.4 oz) (09/15 0500)  Weight change: -2.7 kg (-5 lb 15.2 oz) Filed Weights   10/03/14 1500 10/03/14 1846 10/04/14 0500  Weight: 85.8 kg (189 lb 2.5 oz) 85.14 kg (187 lb 11.2 oz) 82.963 kg (182 lb 14.4 oz)    Intake/Output:    Intake/Output Summary (Last 24 hours) at 10/04/14 1116 Last data filed at 10/04/14 0830  Gross per 24 hour  Intake  61.48 ml  Output    945 ml  Net -883.52 ml     Physical Exam: General:  laying in the bed, no distress   HEENT  Moist oral mucus membranes  Neck  supple   Pulm/lungs  bilateral mild crackles   CVS/Heart  no rub or gallop   Abdomen:   soft, nontender   Extremities:  1+ pitting edema bilaterally   Neurologic:  alert, able to answer questions     Skin:  some erythema over lower extremities   Access:        Basic Metabolic Panel:   Recent Labs Lab 10/01/14 0439  NA 139  K 4.4  CL 104  CO2 24  GLUCOSE 394*  BUN 52*  CREATININE 4.07*  CALCIUM 9.8     CBC:  Recent Labs Lab 10/01/14 0439 10/03/14 0820 10/04/14 0402  WBC 11.9* 6.8 6.0  NEUTROABS 9.8*  --   --   HGB 11.3* 10.0* 9.6*  HCT 36.3 31.6* 29.7*  MCV 84.3 82.5 81.8  PLT 131* 101* 99*      Microbiology:  Recent Results (from the past 720 hour(s))  Blood culture (routine x 2)     Status: None (Preliminary result)   Collection Time: 10/01/14  4:40 AM  Result Value Ref Range Status   Specimen Description BLOOD RIGHT THUMB  Final   Special Requests BOTTLES DRAWN AEROBIC AND ANAEROBIC  Final   Culture NO GROWTH 2  DAYS  Final   Report Status PENDING  Incomplete  Blood culture (routine x 2)     Status: None (Preliminary result)   Collection Time: 10/01/14  4:40 AM  Result Value Ref Range Status   Specimen Description BLOOD RIGHT HAND  Final   Special Requests BOTTLES DRAWN AEROBIC AND ANAEROBIC  Final   Culture NO GROWTH 2 DAYS  Final   Report Status PENDING  Incomplete  MRSA PCR Screening     Status: None   Collection Time: 10/01/14  8:47 AM  Result Value Ref Range Status   MRSA by PCR NEGATIVE NEGATIVE Final    Comment:        The GeneXpert MRSA Assay (FDA approved for NASAL specimens only), is one component of a comprehensive MRSA colonization surveillance program. It is not intended to diagnose MRSA infection nor to guide or monitor treatment for MRSA infections.     Coagulation Studies: No results for input(s): LABPROT, INR in the last 72 hours.  Urinalysis: No results for input(s): COLORURINE, LABSPEC, PHURINE, GLUCOSEU, HGBUR, BILIRUBINUR, KETONESUR, PROTEINUR, UROBILINOGEN, NITRITE, LEUKOCYTESUR in the  last 72 hours.  Invalid input(s): APPERANCEUR    Imaging: No results found.   Medications:   . heparin 850 Units/hr (10/03/14 1230)   . aspirin EC  81 mg Oral Daily  . atorvastatin  80 mg Oral QHS  . bisacodyl  5 mg Oral QHS  . calcium acetate  667 mg Oral TID WC  . calcium carbonate  1 tablet Oral TID  . cephALEXin  250 mg Oral QHS  . citalopram  10 mg Oral Daily  . clopidogrel  75 mg Oral Daily  . docusate sodium  100 mg Oral BID  . epoetin (EPOGEN/PROCRIT) injection  4,000 Units Intravenous Q M,W,F-HD  . feeding supplement (NEPRO CARB STEADY)  237 mL Oral TID BM  . ferrous fumarate  1 tablet Oral Daily  . insulin aspart  0-10 Units Subcutaneous TID WC & HS  . insulin glargine  40 Units Subcutaneous BID  . isosorbide mononitrate  60 mg Oral QAC breakfast  . levothyroxine  75 mcg Oral QAC breakfast  . lidocaine  1 patch Transdermal Q24H  . loratadine  10 mg  Oral Daily  . nadolol  40 mg Oral Daily  . pantoprazole  40 mg Oral Daily  . polyethylene glycol  17 g Oral QODAY  . pregabalin  75 mg Oral QHS  . rOPINIRole  2 mg Oral QHS  . sodium chloride  3 mL Intravenous Q12H  . traZODone  50 mg Oral QHS  . trolamine salicylate  1 application Topical Q3 days  . venlafaxine  37.5 mg Oral QHS  . [START ON 10/05/2014] Vitamin D (Ergocalciferol)  50,000 Units Oral Weekly   acetaminophen **OR** acetaminophen, loperamide, mineral oil-hydrophilic petrolatum, ondansetron **OR** ondansetron (ZOFRAN) IV, oxyCODONE-acetaminophen, REPLENS, sodium chloride, triamcinolone cream  Assessment/ Plan:  74 y.o. female with end-stage renal disease, carotid stenosis, coronary artery disease, diabetes type 2, history of myocardial infarction, hypertension, peripheral vascular disease, admitted for acute pulmonary edema  1. End-stage renal disease. MWF. [facility]. Followed by [facility] Neph[facility] 2. [facility] 3. [facility] 4. Acute pulmonary edema  Plan: HD tomorrow as per routine schedule UF as tolerated EPO PhosLo   LOS: 3 Romen Yutzy 9/15/201611:16 AM

## 2014-10-05 LAB — RENAL FUNCTION PANEL
ALBUMIN: 3.1 g/dL — AB (ref 3.5–5.0)
Anion gap: 10 (ref 5–15)
BUN: 41 mg/dL — ABNORMAL HIGH (ref 6–20)
CALCIUM: 7.8 mg/dL — AB (ref 8.9–10.3)
CO2: 27 mmol/L (ref 22–32)
CREATININE: 3.44 mg/dL — AB (ref 0.44–1.00)
Chloride: 96 mmol/L — ABNORMAL LOW (ref 101–111)
GFR calc non Af Amer: 12 mL/min — ABNORMAL LOW (ref >60)
GFR, EST AFRICAN AMERICAN: 14 mL/min — AB (ref >60)
GLUCOSE: 211 mg/dL — AB (ref 65–99)
PHOSPHORUS: 3.8 mg/dL (ref 2.5–4.6)
Potassium: 3.4 mmol/L — ABNORMAL LOW (ref 3.5–5.1)
SODIUM: 133 mmol/L — AB (ref 135–145)

## 2014-10-05 LAB — CBC
HCT: 28.9 % — ABNORMAL LOW (ref 35.0–47.0)
Hemoglobin: 9 g/dL — ABNORMAL LOW (ref 12.0–16.0)
MCH: 25.5 pg — AB (ref 26.0–34.0)
MCHC: 31.3 g/dL — AB (ref 32.0–36.0)
MCV: 81.6 fL (ref 80.0–100.0)
PLATELETS: 101 10*3/uL — AB (ref 150–440)
RBC: 3.55 MIL/uL — ABNORMAL LOW (ref 3.80–5.20)
RDW: 21.2 % — ABNORMAL HIGH (ref 11.5–14.5)
WBC: 2.7 10*3/uL — ABNORMAL LOW (ref 3.6–11.0)

## 2014-10-05 LAB — GLUCOSE, CAPILLARY
Glucose-Capillary: 246 mg/dL — ABNORMAL HIGH (ref 65–99)
Glucose-Capillary: 133 mg/dL — ABNORMAL HIGH (ref 65–99)
Glucose-Capillary: 74 mg/dL (ref 65–99)

## 2014-10-05 LAB — HEPARIN LEVEL (UNFRACTIONATED): Heparin Unfractionated: <0.1 IU/mL — ABNORMAL LOW (ref 0.30–0.70)

## 2014-10-05 NOTE — Progress Notes (Signed)
Discharge instructions exdplained to pt/ verbalized an understanding/ iv and tele removed/ will transport off unit via wheelchair when ride arrives

## 2014-10-05 NOTE — Care Management Important Message (Signed)
Important Message  Patient Details  Name: Savannah Key MRN: [id number] Date of Birth: January 15, 1941   Medicare Important Message Given:  Yes-third notification given    [name] 10/05/2014, 9:48 AM

## 2014-10-05 NOTE — Progress Notes (Signed)
Subjective:   Doing well  Seen during HD, tolerating well Had NTSEMI this admission  Objective:  Vital signs in last 24 hours:  Temp:  [97.8 F (36.6 C)-98.5 F (36.9 C)] 97.8 F (36.6 C) (09/16 0454) Pulse Rate:  [54-106] 54 (09/16 1030) Resp:  [15-23] 15 (09/16 1030) BP: (60-133)/(40-72) 60/40 mmHg (09/16 1030) SpO2:  [96 %-100 %] 96 % (09/16 1015) Weight:  [85.957 kg (189 lb 8 oz)] 85.957 kg (189 lb 8 oz) (09/16 0454)  Weight change: 0.157 kg (5.5 oz) Filed Weights   10/03/14 1846 10/04/14 0500 10/05/14 0454  Weight: 85.14 kg (187 lb 11.2 oz) 82.963 kg (182 lb 14.4 oz) 85.957 kg (189 lb 8 oz)    Intake/Output:    Intake/Output Summary (Last 24 hours) at 10/05/14 1037 Last data filed at 10/05/14 0854  Gross per 24 hour  Intake 422.18 ml  Output    275 ml  Net 147.18 ml     Physical Exam: General:  laying in the bed, no distress   HEENT  Moist oral mucus membranes  Neck  supple   Pulm/lungs  room air, clear to auscultation   CVS/Heart  no rub or gallop   Abdomen:   soft, nontender   Extremities:  1+ pitting edema bilaterally   Neurologic:  alert, able to answer questions     Skin:  some erythema over lower extremities   Access:        Basic Metabolic Panel:   Recent Labs Lab 10/01/14 0439  NA 139  K 4.4  CL 104  CO2 24  GLUCOSE 394*  BUN 52*  CREATININE 4.07*  CALCIUM 9.8     CBC:  Recent Labs Lab 10/01/14 0439 10/03/14 0820 10/04/14 0402  WBC 11.9* 6.8 6.0  NEUTROABS 9.8*  --   --   HGB 11.3* 10.0* 9.6*  HCT 36.3 31.6* 29.7*  MCV 84.3 82.5 81.8  PLT 131* 101* 99*      Microbiology:  Recent Results (from the past 720 hour(s))  Blood culture (routine x 2)     Status: None (Preliminary result)   Collection Time: 10/01/14  4:40 AM  Result Value Ref Range Status   Specimen Description BLOOD RIGHT THUMB  Final   Special Requests BOTTLES DRAWN AEROBIC AND ANAEROBIC  Final   Culture NO GROWTH 4 DAYS  Final   Report Status  PENDING  Incomplete  Blood culture (routine x 2)     Status: None (Preliminary result)   Collection Time: 10/01/14  4:40 AM  Result Value Ref Range Status   Specimen Description BLOOD RIGHT HAND  Final   Special Requests BOTTLES DRAWN AEROBIC AND ANAEROBIC  Final   Culture NO GROWTH 4 DAYS  Final   Report Status PENDING  Incomplete  MRSA PCR Screening     Status: None   Collection Time: 10/01/14  8:47 AM  Result Value Ref Range Status   MRSA by PCR NEGATIVE NEGATIVE Final    Comment:        The GeneXpert MRSA Assay (FDA approved for NASAL specimens only), is one component of a comprehensive MRSA colonization surveillance program. It is not intended to diagnose MRSA infection nor to guide or monitor treatment for MRSA infections.     Coagulation Studies: No results for input(s): LABPROT, INR in the last 72 hours.  Urinalysis: No results for input(s): COLORURINE, LABSPEC, PHURINE, GLUCOSEU, HGBUR, BILIRUBINUR, KETONESUR, PROTEINUR, UROBILINOGEN, NITRITE, LEUKOCYTESUR in the last 72 hours.  Invalid input(s):  APPERANCEUR    Imaging: No results found.   Medications:     . aspirin EC  81 mg Oral Daily  . atorvastatin  80 mg Oral QHS  . bisacodyl  5 mg Oral QHS  . calcium acetate  667 mg Oral TID WC  . calcium carbonate  1 tablet Oral TID  . cephALEXin  250 mg Oral QHS  . citalopram  10 mg Oral Daily  . clopidogrel  75 mg Oral Daily  . docusate sodium  100 mg Oral BID  . epoetin (EPOGEN/PROCRIT) injection  4,000 Units Intravenous Q M,W,F-HD  . feeding supplement (NEPRO CARB STEADY)  237 mL Oral TID BM  . ferrous fumarate  1 tablet Oral Daily  . insulin aspart  0-10 Units Subcutaneous TID WC & HS  . insulin glargine  40 Units Subcutaneous BID  . isosorbide mononitrate  60 mg Oral QAC breakfast  . levothyroxine  75 mcg Oral QAC breakfast  . lidocaine  1 patch Transdermal Q24H  . loratadine  10 mg Oral Daily  . nadolol  40 mg Oral Daily  . pantoprazole  40 mg  Oral Daily  . polyethylene glycol  17 g Oral QODAY  . pregabalin  75 mg Oral QHS  . rOPINIRole  2 mg Oral QHS  . sodium chloride  3 mL Intravenous Q12H  . traZODone  50 mg Oral QHS  . trolamine salicylate  1 application Topical Q3 days  . venlafaxine  37.5 mg Oral QHS  . Vitamin D (Ergocalciferol)  50,000 Units Oral Weekly   acetaminophen **OR** acetaminophen, loperamide, mineral oil-hydrophilic petrolatum, ondansetron **OR** ondansetron (ZOFRAN) IV, oxyCODONE-acetaminophen, REPLENS, sodium chloride, triamcinolone cream  Assessment/ Plan:  74 y.o. female with end-stage renal disease, carotid stenosis, coronary artery disease, diabetes type 2, history of myocardial infarction, hypertension, peripheral vascular disease, admitted for acute pulmonary edema  1. End-stage renal disease. MWF. [facility]. Followed by [facility] 2. [facility] 3. [facility] 4. Acute pulmonary edema  Plan: Patient seen during dialysis Tolerating well  UF as tolerated EPO PhosLo   LOS: 4 Savannah Key 9/16/201610:37 AM

## 2014-10-05 NOTE — Discharge Summary (Signed)
[facility] [place] at [facility]   PATIENT NAME: Savannah Key    MR#:  [id number]  DATE OF BIRTH:  [birth year]  DATE OF ADMISSION:  10/01/2014 ADMITTING PHYSICIAN: Arnaldo Natal, MD  DATE OF DISCHARGE: No discharge date for patient encounter.  PRIMARY CARE PHYSICIAN: Bobbye Morton, MD    ADMISSION DIAGNOSIS:  Acute respiratory failure with hypoxia [J96.01] Congestive heart failure, unspecified congestive heart failure chronicity, unspecified congestive heart failure type [I50.9]  DISCHARGE DIAGNOSIS:  Active Problems:   Respiratory failure with hypoxia   CHF (congestive heart failure)   SECONDARY DIAGNOSIS:   Past Medical History  Diagnosis Date  . Chronic kidney disease   . GERD (gastroesophageal reflux disease)   . Carotid artery stenosis   . Coronary artery disease   . Hypothyroidism   . Diabetes mellitus without complication   . Depression   . Arthritis   . Kidney failure   . Liver failure   . MI (myocardial infarction)   . HTN (hypertension)   . Broken leg     R     ADMITTING HISTORY  Chief Complaint: Respiratory distress HPI: The patient with past medical history of end-stage renal disease presents to the emergency department complaining of difficulty breathing and chest pain. The patient states that her chest began to hurt approximately 8 hours prior to arrival. She states that she took her "chest pain medicine" without relief. She also states that she has had a cough productive of phlegm but denies fevers, nausea or vomiting. In the emergency department the patient was found to be hypoxic and required BiPAP. Due to chest pain she was also given 325 mg of aspirin. CXR showed fluid overload which prompted emergency department staff to call for admission.  HOSPITAL COURSE:   This 74 year old Caucasian female with end-stage renal disease admitted for respiratory distress secondary to fluid overload.  * NSTEMI Troponin has  increased to 18. Troponin on admission was 0.22. Patient did have chest pain on admission. Her cardiac insult was likely 2 days prior.  patient is on aspirin, statin, beta blocker. Plavix  the printed drip stopped.  Patient had cardiac catheterization previously which showed significant blockages not examined able to PCI or intervention.  Seen by cardiology and suggested optimizing medical management. Plavix added.  Patient will need Ranexa if she has ongoing chest pain as outpatient.  * Acute hypoxic respiratory failure due to pulmonary edema. Has resolved with dialysis. Now on room air.  * Diabetes mellitus type 2: Continue basal insulin (the patient takes twice a day Lantus). Started on sliding scale insulin while hospitalized  * End-stage renal disease: HD per nephrology.  * Hypothyroidism: Continue Synthroid  * Depression: Continue venlafaxine, Celexa and trazodone  * GI prophylaxis: Pantoprazole  * Anemia of chronic disease is stable   stable for discharge home to continue her dialysis as per prior schedule.   CONSULTS OBTAINED:  Treatment Team:  Mosetta Pigeon, MD Marcina Millard, MD  DRUG ALLERGIES:   Allergies  Allergen Reactions  . Sulfa Antibiotics Other (See Comments)    caused bloody urine    DISCHARGE MEDICATIONS:   Current Discharge Medication List    START taking these medications   Details  clopidogrel (PLAVIX) 75 MG tablet Take 1 tablet (75 mg total) by mouth daily. Qty: 30 tablet, Refills: 0      CONTINUE these medications which have CHANGED   Details  !! insulin glargine (LANTUS) 100 UNIT/ML injection Inject 0.5 mLs (  50 Units total) into the skin every morning. Qty: 10 mL, Refills: 11     !! - Potential duplicate medications found. Please discuss with provider.    CONTINUE these medications which have NOT CHANGED   Details  aspirin EC 81 MG tablet Take by mouth daily.    atorvastatin (LIPITOR) 20 MG tablet Take 80 mg by mouth at  bedtime.     bisacodyl (DULCOLAX) 5 MG EC tablet Take 5 mg by mouth at bedtime.     calcium acetate (PHOSLO) 667 MG capsule Take 667 mg by mouth 3 (three) times daily with meals.    calcium carbonate (TUMS - DOSED IN MG ELEMENTAL CALCIUM) 500 MG chewable tablet Chew 1 tablet by mouth 3 (three) times daily.    citalopram (CELEXA) 10 MG tablet Take 10 mg by mouth daily.    ergocalciferol (VITAMIN D2) 50000 UNITS capsule Take 50,000 Units by mouth once a week.    famotidine (PEPCID) 20 MG tablet Take 20 mg by mouth daily with breakfast.     ferrous fumarate (HEMOCYTE - 106 MG FE) 325 (106 FE) MG TABS tablet Take 1 tablet by mouth.    hydrOXYzine (ATARAX/VISTARIL) 10 MG tablet Take 10 mg by mouth 3 (three) times daily as needed.    !! insulin glargine (LANTUS) 100 UNIT/ML injection Inject 50 Units into the skin daily before supper.    insulin NPH Human (HUMULIN N,NOVOLIN N) 100 UNIT/ML injection Inject 15 Units into the skin 3 (three) times daily before meals.     isosorbide mononitrate (IMDUR) 60 MG 24 hr tablet Take 60 mg by mouth daily before breakfast.    levothyroxine (SYNTHROID, LEVOTHROID) 100 MCG tablet Take 75 mcg by mouth daily before breakfast.     lidocaine (LIDODERM) 5 % Place 1 patch onto the skin daily. Remove & Discard patch within 12 hours or as directed by MD    nadolol (CORGARD) 40 MG tablet Take 40 mg by mouth daily.    Nutritional Supplements (FEEDING SUPPLEMENT, NEPRO CARB STEADY,) LIQD Take 237 mLs by mouth 3 (three) times daily between meals.    omeprazole (PRILOSEC) 20 MG capsule Take 20 mg by mouth daily.    oxyCODONE-acetaminophen (ROXICET) 5-325 MG per tablet Take 1 tablet by mouth every 8 (eight) hours as needed for moderate pain or severe pain (Do not drive or operate heavy machinery while taking as can cause drowsiness.). Qty: 9 tablet, Refills: 0    polyethylene glycol (MIRALAX / GLYCOLAX) packet Take 17 g by mouth every other day. Mix in 4 to 8 ounces  of fluid before taking.    pregabalin (LYRICA) 50 MG capsule Take 75 mg by mouth at bedtime.     rOPINIRole (REQUIP) 2 MG tablet Take 2 mg by mouth at bedtime.    sodium chloride (OCEAN) 0.65 % SOLN nasal spray Place 1 spray into both nostrils as needed for congestion.    trolamine salicylate (ASPERCREME) 10 % cream Apply 1 application topically every 3 (three) days.    Vaginal Lubricant (REPLENS) GEL Place 1 application vaginally as needed.    venlafaxine (EFFEXOR) 37.5 MG tablet Take 37.5 mg by mouth at bedtime.     cephALEXin (KEFLEX) 250 MG capsule Take 250 mg by mouth at bedtime.     cetirizine (ZYRTEC) 5 MG tablet Take 5 mg by mouth at bedtime.     mineral oil-hydrophilic petrolatum (AQUAPHOR) ointment Apply 1 application topically as needed for dry skin.    pantoprazole (PROTONIX) 40 MG  tablet Take 40 mg by mouth 2 (two) times daily.    selenium sulfide (SELSUN) 2.5 % shampoo Apply 1 application topically every 7 (seven) weeks.    traZODone (DESYREL) 50 MG tablet Take 50 mg by mouth at bedtime.    triamcinolone cream (KENALOG) 0.1 % Apply 1 application topically daily as needed.     !! - Potential duplicate medications found. Please discuss with provider.       Today    VITAL SIGNS:  Blood pressure 82/45, pulse 55, temperature 97.8 F (36.6 C), temperature source Oral, resp. rate 17, height 5' (1.524 m), weight 85.957 kg (189 lb 8 oz), SpO2 96 %.  I/O:   Intake/Output Summary (Last 24 hours) at 10/05/14 1318 Last data filed at 10/05/14 0854  Gross per 24 hour  Intake 302.18 ml  Output    275 ml  Net  27.18 ml    PHYSICAL EXAMINATION:  Physical Exam  GENERAL:  74 y.o.-year-old patient lying in the bed with no acute distress.  LUNGS: Normal breath sounds bilaterally, no wheezing, rales,rhonchi or crepitation. No use of accessory muscles of respiration.  CARDIOVASCULAR: S1, S2 normal. No murmurs, rubs, or gallops.  ABDOMEN: Soft, non-tender, non-distended.  Bowel sounds present. No organomegaly or mass.  NEUROLOGIC: Moves all 4 extremities. PSYCHIATRIC: The patient is alert and oriented x 3.  SKIN: No obvious rash, lesion, or ulcer.   DATA REVIEW:   CBC  Recent Labs Lab 10/05/14 1020  WBC 2.7*  HGB 9.0*  HCT 28.9*  PLT 101*    Chemistries   Recent Labs Lab 10/01/14 0439 10/05/14 1020  NA 139 133*  K 4.4 3.4*  CL 104 96*  CO2 24 27  GLUCOSE 394* 211*  BUN 52* 41*  CREATININE 4.07* 3.44*  CALCIUM 9.8 7.8*  AST 45*  --   ALT 29  --   ALKPHOS 146*  --   BILITOT 0.8  --     Cardiac Enzymes  Recent Labs Lab 10/03/14 1609  TROPONINI 18.04*    Microbiology Results  Results for orders placed or performed during the hospital encounter of 10/01/14  Blood culture (routine x 2)     Status: None (Preliminary result)   Collection Time: 10/01/14  4:40 AM  Result Value Ref Range Status   Specimen Description BLOOD RIGHT THUMB  Final   Special Requests BOTTLES DRAWN AEROBIC AND ANAEROBIC  Final   Culture NO GROWTH 4 DAYS  Final   Report Status PENDING  Incomplete  Blood culture (routine x 2)     Status: None (Preliminary result)   Collection Time: 10/01/14  4:40 AM  Result Value Ref Range Status   Specimen Description BLOOD RIGHT HAND  Final   Special Requests BOTTLES DRAWN AEROBIC AND ANAEROBIC  Final   Culture NO GROWTH 4 DAYS  Final   Report Status PENDING  Incomplete  MRSA PCR Screening     Status: None   Collection Time: 10/01/14  8:47 AM  Result Value Ref Range Status   MRSA by PCR NEGATIVE NEGATIVE Final    Comment:        The GeneXpert MRSA Assay (FDA approved for NASAL specimens only), is one component of a comprehensive MRSA colonization surveillance program. It is not intended to diagnose MRSA infection nor to guide or monitor treatment for MRSA infections.     RADIOLOGY:  No results found.    Follow up with PCP in 1 week.  Management plans discussed with the  patient, family and they  are in agreement.  CODE STATUS:     Code Status Orders        Start     Ordered   10/01/14 0852  Full code   Continuous     10/01/14 0851      TOTAL TIME TAKING CARE OF THIS PATIENT ON DAY OF DISCHARGE: more than 30 minutes.    Milagros Loll R M.D on 10/05/2014 at 1:18 PM  Between 7am to 6pm - Pager - [phone]  After 6pm go to [email or web address] - password [email or web address] [facility]  [facility]  Office  [phone]  CC: Primary care physician; Bobbye Morton, MD

## 2014-10-06 LAB — CULTURE, BLOOD (ROUTINE X 2)
Culture: NO GROWTH
Culture: NO GROWTH

## 2014-10-07 ENCOUNTER — Emergency Department
Admission: EM | Admit: 2014-10-07 | Discharge: 2014-10-07 | Disposition: A | Discharge status: Home / Self Care | Payer: Medicare (Managed Care) | Attending: Emergency Medicine | Admitting: Emergency Medicine

## 2014-10-07 ENCOUNTER — Encounter: Payer: Self-pay | Admitting: *Deleted

## 2014-10-07 DIAGNOSIS — Y9289 Other specified places as the place of occurrence of the external cause: Secondary | ICD-10-CM | POA: Diagnosis not present

## 2014-10-07 DIAGNOSIS — Z79899 Other long term (current) drug therapy: Secondary | ICD-10-CM | POA: Diagnosis not present

## 2014-10-07 DIAGNOSIS — Y998 Other external cause status: Secondary | ICD-10-CM | POA: Diagnosis not present

## 2014-10-07 DIAGNOSIS — I120 Hypertensive chronic kidney disease with stage 5 chronic kidney disease or end stage renal disease: Secondary | ICD-10-CM | POA: Insufficient documentation

## 2014-10-07 DIAGNOSIS — Z992 Dependence on renal dialysis: Secondary | ICD-10-CM | POA: Insufficient documentation

## 2014-10-07 DIAGNOSIS — T383X1A Poisoning by insulin and oral hypoglycemic [antidiabetic] drugs, accidental (unintentional), initial encounter: Secondary | ICD-10-CM | POA: Insufficient documentation

## 2014-10-07 DIAGNOSIS — I252 Old myocardial infarction: Secondary | ICD-10-CM | POA: Insufficient documentation

## 2014-10-07 DIAGNOSIS — T383X5A Adverse effect of insulin and oral hypoglycemic [antidiabetic] drugs, initial encounter: Secondary | ICD-10-CM

## 2014-10-07 DIAGNOSIS — E11649 Type 2 diabetes mellitus with hypoglycemia without coma: Secondary | ICD-10-CM | POA: Insufficient documentation

## 2014-10-07 DIAGNOSIS — Y9389 Activity, other specified: Secondary | ICD-10-CM | POA: Insufficient documentation

## 2014-10-07 DIAGNOSIS — Z7982 Long term (current) use of aspirin: Secondary | ICD-10-CM | POA: Diagnosis not present

## 2014-10-07 DIAGNOSIS — N186 End stage renal disease: Secondary | ICD-10-CM | POA: Diagnosis not present

## 2014-10-07 DIAGNOSIS — E160 Drug-induced hypoglycemia without coma: Secondary | ICD-10-CM

## 2014-10-07 DIAGNOSIS — Z794 Long term (current) use of insulin: Secondary | ICD-10-CM | POA: Diagnosis not present

## 2014-10-07 DIAGNOSIS — Z7902 Long term (current) use of antithrombotics/antiplatelets: Secondary | ICD-10-CM | POA: Insufficient documentation

## 2014-10-07 LAB — CBC WITH DIFFERENTIAL/PLATELET
BASOS ABS: 0.1 10*3/uL (ref 0–0.1)
BASOS PCT: 1 %
EOS ABS: 0.3 10*3/uL (ref 0–0.7)
EOS PCT: 3 %
HCT: 31.2 % — ABNORMAL LOW (ref 35.0–47.0)
Hemoglobin: 9.7 g/dL — ABNORMAL LOW (ref 12.0–16.0)
LYMPHS ABS: 0.7 10*3/uL — AB (ref 1.0–3.6)
Lymphocytes Relative: 9 %
MCH: 25.7 pg — AB (ref 26.0–34.0)
MCHC: 31.1 g/dL — ABNORMAL LOW (ref 32.0–36.0)
MCV: 82.6 fL (ref 80.0–100.0)
Monocytes Absolute: 0.7 10*3/uL (ref 0.2–0.9)
Monocytes Relative: 9 %
Neutro Abs: 6.7 10*3/uL — ABNORMAL HIGH (ref 1.4–6.5)
Neutrophils Relative %: 78 %
PLATELETS: 127 10*3/uL — AB (ref 150–440)
RBC: 3.78 MIL/uL — AB (ref 3.80–5.20)
RDW: 20.9 % — ABNORMAL HIGH (ref 11.5–14.5)
WBC: 8.5 10*3/uL (ref 3.6–11.0)

## 2014-10-07 LAB — COMPREHENSIVE METABOLIC PANEL
ALBUMIN: 3.3 g/dL — AB (ref 3.5–5.0)
ALT: 23 U/L (ref 14–54)
AST: 42 U/L — ABNORMAL HIGH (ref 15–41)
Alkaline Phosphatase: 108 U/L (ref 38–126)
Anion gap: 8 (ref 5–15)
BUN: 39 mg/dL — ABNORMAL HIGH (ref 6–20)
CHLORIDE: 99 mmol/L — AB (ref 101–111)
CO2: 29 mmol/L (ref 22–32)
CREATININE: 4.19 mg/dL — AB (ref 0.44–1.00)
Calcium: 7.7 mg/dL — ABNORMAL LOW (ref 8.9–10.3)
GFR calc non Af Amer: 10 mL/min — ABNORMAL LOW (ref >60)
GFR, EST AFRICAN AMERICAN: 11 mL/min — AB (ref >60)
Glucose, Bld: 147 mg/dL — ABNORMAL HIGH (ref 65–99)
Potassium: 3.7 mmol/L (ref 3.5–5.1)
SODIUM: 136 mmol/L (ref 135–145)
Total Bilirubin: 0.9 mg/dL (ref 0.3–1.2)
Total Protein: 5.9 g/dL — ABNORMAL LOW (ref 6.5–8.1)

## 2014-10-07 NOTE — Discharge Instructions (Signed)
Blood Glucose Monitoring °Monitoring your blood glucose (also know as blood sugar) helps you to manage your diabetes. It also helps you and your health care provider monitor your diabetes and determine how well your treatment plan is working. °WHY SHOULD YOU MONITOR YOUR BLOOD GLUCOSE? °· It can help you understand how food, exercise, and medicine affect your blood glucose. °· It allows you to know what your blood glucose is at any given moment. You can quickly tell if you are having low blood glucose (hypoglycemia) or high blood glucose (hyperglycemia). °· It can help you and your health care provider know how to adjust your medicines. °· It can help you understand how to manage an illness or adjust medicine for exercise. °WHEN SHOULD YOU TEST? °Your health care provider will help you decide how often you should check your blood glucose. This may depend on the type of diabetes you have, your diabetes control, or the types of medicines you are taking. Be sure to write down all of your blood glucose readings so that this information can be reviewed with your health care provider. See below for examples of testing times that your health care provider may suggest. °Type 1 Diabetes °· Test 4 times a day if you are in good control, using an insulin pump, or perform multiple daily injections. °· If your diabetes is not well controlled or if you are sick, you may need to monitor more often. °· It is a good idea to also monitor: °¨ Before and after exercise. °¨ Between meals and 2 hours after a meal. °¨ Occasionally between 2:00 a.m. and 3:00 a.m. °Type 2 Diabetes °· It can vary with each person, but generally, if you are on insulin, test 4 times a day. °· If you take medicines by mouth (orally), test 2 times a day. °· If you are on a controlled diet, test once a day. °· If your diabetes is not well controlled or if you are sick, you may need to monitor more often. °HOW TO MONITOR YOUR BLOOD GLUCOSE °Supplies  Needed °· Blood glucose meter. °· Test strips for your meter. Each meter has its own strips. You must use the strips that go with your own meter. °· A pricking needle (lancet). °· A device that holds the lancet (lancing device). °· A journal or log book to write down your results. °Procedure °· Wash your hands with soap and water. Alcohol is not preferred. °· Prick the side of your finger (not the tip) with the lancet. °· Gently milk the finger until a small drop of blood appears. °· Follow the instructions that come with your meter for inserting the test strip, applying blood to the strip, and using your blood glucose meter. °Other Areas to Get Blood for Testing °Some meters allow you to use other areas of your body (other than your finger) to test your blood. These areas are called alternative sites. The most common alternative sites are: °· The forearm. °· The thigh. °· The back area of the lower leg. °· The palm of the hand. °The blood flow in these areas is slower. Therefore, the blood glucose values you get may be delayed, and the numbers are different from what you would get from your fingers. Do not use alternative sites if you think you are having hypoglycemia. Your reading will not be accurate. Always use a finger if you are having hypoglycemia. Also, if you cannot feel your lows (hypoglycemia unawareness), always use your fingers for your   blood glucose checks. ADDITIONAL TIPS FOR GLUCOSE MONITORING  Do not reuse lancets.  Always carry your supplies with you.  All blood glucose meters have a 24-hour "hotline" number to call if you have questions or need help.  Adjust (calibrate) your blood glucose meter with a control solution after finishing a few boxes of strips. BLOOD GLUCOSE RECORD KEEPING It is a good idea to keep a daily record or log of your blood glucose readings. Most glucose meters, if not all, keep your glucose records stored in the meter. Some meters come with the ability to download  your records to your home computer. Keeping a record of your blood glucose readings is especially helpful if you are wanting to look for patterns. Make notes to go along with the blood glucose readings because you might forget what happened at that exact time. Keeping good records helps you and your health care provider to work together to achieve good diabetes management.  Document Released: 01/08/2003 Document Revised: 05/22/2013 Document Reviewed: 05/30/2012 [facility] Patient Information 2015 [place], [place]. This information is not intended to replace advice given to you by your health care provider. Make sure you discuss any questions you have with your health care provider.  Please return immediately if condition worsens. Please contact her primary physician or the physician you were given for referral. If you have any specialist physicians involved in her treatment and plan please also contact them. Thank you for using [facility]. Please continue to monitor blood glucose levels at home. Please decrease the a.m. insulin dosage to 40 units.

## 2014-10-07 NOTE — ED Notes (Addendum)
Per EMS, pt was at [employer] with family, blood glucose dropped, EMS called, initial CBG per EMS was 41, EMS established PIV, gave 1 amp D50.  Pt was A&O the entire time, pt was at [facility] PM for same.  Pt is a ESRD, dialysis M-W-F, has been having difficulty controlling her blood glucose.

## 2014-10-07 NOTE — ED Provider Notes (Signed)
Time Seen: Approximately 1500 I have reviewed the triage notes  Chief Complaint: Hypoglycemia   History of Present Illness: Savannah Key is a 74 y.o. female who presents with hypoglycemia. Patient has a history of multiple medical problems including renal failure etc. Patient apparently had a drop in her blood sugar today. The family when they were interviewed states same thing happened at the same time yesterday. Today the patient was out shopping with her family when she had an altered mental status and EMS noted a blood sugar of 41 at the scene the patient was given one amp of D50 with improvement. Review of the medical records shows that she was just recently discharged with a history of acute myocardial infarction. Patient at that time had an increase in her insulin dosage in the a.m.   Past Medical History  Diagnosis Date  . Chronic kidney disease   . GERD (gastroesophageal reflux disease)   . Carotid artery stenosis   . Coronary artery disease   . Hypothyroidism   . Diabetes mellitus without complication   . Depression   . Arthritis   . Kidney failure   . Liver failure   . MI (myocardial infarction)   . HTN (hypertension)   . Broken leg     R    Patient Active Problem List   Diagnosis Date Noted  . CHF (congestive heart failure) 10/03/2014  . Respiratory failure with hypoxia 10/01/2014    Past Surgical History  Procedure Laterality Date  . Av fistula placement Left   . Cholecystectomy    . Peripheral vascular catheterization N/A 06/11/2014    Procedure: A/V Shuntogram/Fistulagram;  Surgeon: Annice Needy, MD;  Location: [facility];  Service: Cardiovascular;  Laterality: N/A;  . Peripheral vascular catheterization Left 06/11/2014    Procedure: A/V Shunt Intervention;  Surgeon: Annice Needy, MD;  Location: [facility];  Service: Cardiovascular;  Laterality: Left;  . Open reduction internal fixation (orif) distal radial fracture Left 08/09/2014   Procedure: OPEN REDUCTION INTERNAL FIXATION (ORIF) DISTAL RADIAL FRACTURE;  Surgeon: Kennedy Bucker, MD;  Location: [facility];  Service: Orthopedics;  Laterality: Left;    Past Surgical History  Procedure Laterality Date  . Av fistula placement Left   . Cholecystectomy    . Peripheral vascular catheterization N/A 06/11/2014    Procedure: A/V Shuntogram/Fistulagram;  Surgeon: Annice Needy, MD;  Location: [facility];  Service: Cardiovascular;  Laterality: N/A;  . Peripheral vascular catheterization Left 06/11/2014    Procedure: A/V Shunt Intervention;  Surgeon: Annice Needy, MD;  Location: [facility];  Service: Cardiovascular;  Laterality: Left;  . Open reduction internal fixation (orif) distal radial fracture Left 08/09/2014    Procedure: OPEN REDUCTION INTERNAL FIXATION (ORIF) DISTAL RADIAL FRACTURE;  Surgeon: Kennedy Bucker, MD;  Location: [facility];  Service: Orthopedics;  Laterality: Left;    Current Outpatient Rx  Name  Route  Sig  Dispense  Refill  . aspirin EC 81 MG tablet   Oral   Take by mouth daily.         Marland Kitchen atorvastatin (LIPITOR) 20 MG tablet   Oral   Take 80 mg by mouth at bedtime.          . bisacodyl (DULCOLAX) 5 MG EC tablet   Oral   Take 5 mg by mouth at bedtime.          . calcium acetate (PHOSLO) 667 MG capsule   Oral  Take 667 mg by mouth 3 (three) times daily with meals.         . calcium carbonate (TUMS - DOSED IN MG ELEMENTAL CALCIUM) 500 MG chewable tablet   Oral   Chew 1 tablet by mouth 3 (three) times daily.         . cephALEXin (KEFLEX) 250 MG capsule   Oral   Take 250 mg by mouth at bedtime.          . cetirizine (ZYRTEC) 5 MG tablet   Oral   Take 5 mg by mouth at bedtime.          . citalopram (CELEXA) 10 MG tablet   Oral   Take 10 mg by mouth daily.         . clopidogrel (PLAVIX) 75 MG tablet   Oral   Take 1 tablet (75 mg total) by mouth daily.   30 tablet   0   . ergocalciferol (VITAMIN D2) 50000 UNITS  capsule   Oral   Take 50,000 Units by mouth once a week.         . famotidine (PEPCID) 20 MG tablet   Oral   Take 20 mg by mouth daily with breakfast.          . ferrous fumarate (HEMOCYTE - 106 MG FE) 325 (106 FE) MG TABS tablet   Oral   Take 1 tablet by mouth.         . hydrOXYzine (ATARAX/VISTARIL) 10 MG tablet   Oral   Take 10 mg by mouth 3 (three) times daily as needed.         . insulin glargine (LANTUS) 100 UNIT/ML injection   Subcutaneous   Inject 50 Units into the skin daily before supper.         . insulin glargine (LANTUS) 100 UNIT/ML injection   Subcutaneous   Inject 0.5 mLs (50 Units total) into the skin every morning.   10 mL   11   . insulin NPH Human (HUMULIN N,NOVOLIN N) 100 UNIT/ML injection   Subcutaneous   Inject 15 Units into the skin 3 (three) times daily before meals.          . isosorbide mononitrate (IMDUR) 60 MG 24 hr tablet   Oral   Take 60 mg by mouth daily before breakfast.         . levothyroxine (SYNTHROID, LEVOTHROID) 100 MCG tablet   Oral   Take 75 mcg by mouth daily before breakfast.          . lidocaine (LIDODERM) 5 %   Transdermal   Place 1 patch onto the skin daily. Remove & Discard patch within 12 hours or as directed by MD         . mineral oil-hydrophilic petrolatum (AQUAPHOR) ointment   Topical   Apply 1 application topically as needed for dry skin.         . nadolol (CORGARD) 40 MG tablet   Oral   Take 40 mg by mouth daily.         . Nutritional Supplements (FEEDING SUPPLEMENT, NEPRO CARB STEADY,) LIQD   Oral   Take 237 mLs by mouth 3 (three) times daily between meals.         Marland Kitchen omeprazole (PRILOSEC) 20 MG capsule   Oral   Take 20 mg by mouth daily.         Marland Kitchen oxyCODONE-acetaminophen (ROXICET) 5-325 MG per tablet   Oral   Take 1  tablet by mouth every 8 (eight) hours as needed for moderate pain or severe pain (Do not drive or operate heavy machinery while taking as can cause drowsiness.).    9 tablet   0   . pantoprazole (PROTONIX) 40 MG tablet   Oral   Take 40 mg by mouth 2 (two) times daily.         . polyethylene glycol (MIRALAX / GLYCOLAX) packet   Oral   Take 17 g by mouth every other day. Mix in 4 to 8 ounces of fluid before taking.         . pregabalin (LYRICA) 50 MG capsule   Oral   Take 75 mg by mouth at bedtime.          Marland Kitchen rOPINIRole (REQUIP) 2 MG tablet   Oral   Take 2 mg by mouth at bedtime.         . selenium sulfide (SELSUN) 2.5 % shampoo   Topical   Apply 1 application topically every 7 (seven) weeks.         . sodium chloride (OCEAN) 0.65 % SOLN nasal spray   Each Nare   Place 1 spray into both nostrils as needed for congestion.         . traZODone (DESYREL) 50 MG tablet   Oral   Take 50 mg by mouth at bedtime.         . triamcinolone cream (KENALOG) 0.1 %   Topical   Apply 1 application topically daily as needed.         . trolamine salicylate (ASPERCREME) 10 % cream   Topical   Apply 1 application topically every 3 (three) days.         . Vaginal Lubricant (REPLENS) GEL   Vaginal   Place 1 application vaginally as needed.         . venlafaxine (EFFEXOR) 37.5 MG tablet   Oral   Take 37.5 mg by mouth at bedtime.            Allergies:  Sulfa antibiotics  Family History: History reviewed. No pertinent family history.  Social History: Social History  Substance Use Topics  . Smoking status: Never Smoker   . Smokeless tobacco: Never Used  . Alcohol Use: No     Review of Systems:   10 point review of systems was performed and was otherwise negative:  Constitutional: No fever Eyes: No visual disturbances ENT: No sore throat, ear pain Cardiac: No chest pain Respiratory: No shortness of breath, wheezing, or stridor Abdomen: No abdominal pain, no vomiting, No diarrhea Endocrine: No weight loss, No night sweats Extremities: No peripheral edema, cyanosis Skin: No rashes, easy bruising Neurologic: No  focal weakness, trouble with speech or swollowing Urologic: No dysuria, Hematuria, or urinary frequency   Physical Exam:  ED Triage Vitals  Enc Vitals Group     BP 10/07/14 1626 103/52 mmHg     Pulse Rate 10/07/14 1456 50     Resp 10/07/14 1626 16     Temp 10/07/14 1456 98.3 F (36.8 C)     Temp Source 10/07/14 1456 Oral     SpO2 10/07/14 1449 98 %     Weight 10/07/14 1456 185 lb (83.915 kg)     Height 10/07/14 1456 5' (1.524 m)     Head Cir --      Peak Flow --      Pain Score --      Pain Loc --  Pain Edu? --      Excl. in GC? --     General: Awake , Alert , and Oriented times 3; GCS 15 Head: Normal cephalic , atraumatic Eyes: Pupils equal , round, reactive to light Nose/Throat: No nasal drainage, patent upper airway without erythema or exudate.  Neck: Supple, Full range of motion, No anterior adenopathy or palpable thyroid masses Lungs: Clear to ascultation without wheezes , rhonchi, or rales Heart: Regular rate, regular rhythm without murmurs , gallops , or rubs Abdomen: Soft, non tender without rebound, guarding , or rigidity; bowel sounds positive and symmetric in all 4 quadrants. No organomegaly .        Extremities: Functioning fistula in the left upper extremity 2 plus symmetric pulses. No edema, clubbing or cyanosis Neurologic: normal ambulation, Motor symmetric without deficits, sensory intact Skin: warm, dry, no rashes   Labs:   All laboratory work was reviewed including any pertinent negatives or positives listed below:  Labs Reviewed  CBC WITH DIFFERENTIAL/PLATELET - Abnormal; Notable for the following:    RBC 3.78 (*)    Hemoglobin 9.7 (*)    HCT 31.2 (*)    MCH 25.7 (*)    MCHC 31.1 (*)    RDW 20.9 (*)    Platelets 127 (*)    Neutro Abs 6.7 (*)    Lymphs Abs 0.7 (*)    All other components within normal limits  COMPREHENSIVE METABOLIC PANEL - Abnormal; Notable for the following:    Chloride 99 (*)    Glucose, Bld 147 (*)    BUN 39 (*)     Creatinine, Ser 4.19 (*)    Calcium 7.7 (*)    Total Protein 5.9 (*)    Albumin 3.3 (*)    AST 42 (*)    GFR calc non Af Amer 10 (*)    GFR calc Af Amer 11 (*)    All other components within normal limits     ED Course: \ Patient's stay here was uneventful and she remained hemodynamically stable. I felt most likely this increase in her morning insulin and has created episodes of hypoglycemia. She was apparently evaluated last night for the same at [facility]. According to the family there was no adjustments of her morning insulin. She denies any other clinical complaints. Patient does have dialysis scheduled for tomorrow and also follow up with her primary physician on Tuesday. I advised him to decrease her morning insulin to 40 units from the current 50.    Assessment:  Hypoglycemia Recent myocardial infarction History of renal failure  Final Clinical Impression: Final diagnoses:  Hypoglycemia due to insulin     Plan: Patient was advised to return immediately if condition worsens. Patient was advised to follow up with her primary care physician or other specialized physicians involved and in their current assessment. Patient's discharge instructions were discussed with the family who are at the bedside.            Jennye Moccasin, MD 10/07/14 [phone]

## 2014-11-05 ENCOUNTER — Other Ambulatory Visit: Payer: Self-pay | Admitting: Vascular Surgery

## 2014-11-06 ENCOUNTER — Ambulatory Visit
Admission: RE | Admit: 2014-11-06 | Discharge: 2014-11-06 | Disposition: A | Discharge status: Home / Self Care | Payer: Medicare (Managed Care) | Source: Ambulatory Visit | Attending: Vascular Surgery | Admitting: Vascular Surgery

## 2014-11-06 ENCOUNTER — Encounter
Admission: RE | Disposition: A | Discharge status: Home / Self Care | Payer: Self-pay | Source: Ambulatory Visit | Attending: Vascular Surgery

## 2014-11-06 DIAGNOSIS — Z794 Long term (current) use of insulin: Secondary | ICD-10-CM | POA: Insufficient documentation

## 2014-11-06 DIAGNOSIS — I6529 Occlusion and stenosis of unspecified carotid artery: Secondary | ICD-10-CM | POA: Insufficient documentation

## 2014-11-06 DIAGNOSIS — Z6837 Body mass index (BMI) 37.0-37.9, adult: Secondary | ICD-10-CM | POA: Insufficient documentation

## 2014-11-06 DIAGNOSIS — R0989 Other specified symptoms and signs involving the circulatory and respiratory systems: Secondary | ICD-10-CM | POA: Diagnosis not present

## 2014-11-06 DIAGNOSIS — Z87891 Personal history of nicotine dependence: Secondary | ICD-10-CM | POA: Insufficient documentation

## 2014-11-06 DIAGNOSIS — Z79899 Other long term (current) drug therapy: Secondary | ICD-10-CM | POA: Diagnosis not present

## 2014-11-06 DIAGNOSIS — I519 Heart disease, unspecified: Secondary | ICD-10-CM | POA: Insufficient documentation

## 2014-11-06 DIAGNOSIS — Z7982 Long term (current) use of aspirin: Secondary | ICD-10-CM | POA: Diagnosis not present

## 2014-11-06 DIAGNOSIS — E785 Hyperlipidemia, unspecified: Secondary | ICD-10-CM | POA: Insufficient documentation

## 2014-11-06 DIAGNOSIS — Z95828 Presence of other vascular implants and grafts: Secondary | ICD-10-CM | POA: Insufficient documentation

## 2014-11-06 DIAGNOSIS — N186 End stage renal disease: Secondary | ICD-10-CM | POA: Insufficient documentation

## 2014-11-06 DIAGNOSIS — T82858A Stenosis of vascular prosthetic devices, implants and grafts, initial encounter: Secondary | ICD-10-CM | POA: Diagnosis present

## 2014-11-06 DIAGNOSIS — Y832 Surgical operation with anastomosis, bypass or graft as the cause of abnormal reaction of the patient, or of later complication, without mention of misadventure at the time of the procedure: Secondary | ICD-10-CM | POA: Insufficient documentation

## 2014-11-06 DIAGNOSIS — Z992 Dependence on renal dialysis: Secondary | ICD-10-CM | POA: Diagnosis not present

## 2014-11-06 DIAGNOSIS — E1122 Type 2 diabetes mellitus with diabetic chronic kidney disease: Secondary | ICD-10-CM | POA: Diagnosis not present

## 2014-11-06 DIAGNOSIS — I120 Hypertensive chronic kidney disease with stage 5 chronic kidney disease or end stage renal disease: Secondary | ICD-10-CM | POA: Diagnosis not present

## 2014-11-06 HISTORY — PX: PERIPHERAL VASCULAR CATHETERIZATION: SHX172C

## 2014-11-06 LAB — GLUCOSE, CAPILLARY
Glucose-Capillary: 63 mg/dL — ABNORMAL LOW (ref 65–99)
Glucose-Capillary: 154 mg/dL — ABNORMAL HIGH (ref 65–99)

## 2014-11-06 LAB — POTASSIUM (ARMC VASCULAR LAB ONLY): POTASSIUM (ARMC VASCULAR LAB): 4.3

## 2014-11-06 SURGERY — A/V SHUNTOGRAM/FISTULAGRAM
Anesthesia: Moderate Sedation | Laterality: Left

## 2014-11-06 MED ORDER — DEXTROSE 50 % IV SOLN
INTRAVENOUS | Status: AC
  Filled 2014-11-06: qty 50

## 2014-11-06 MED ORDER — MIDAZOLAM HCL 2 MG/2ML IJ SOLN
INTRAMUSCULAR | Status: AC
  Filled 2014-11-06: qty 4

## 2014-11-06 MED ORDER — HEPARIN (PORCINE) IN NACL 2-0.9 UNIT/ML-% IJ SOLN
INTRAMUSCULAR | Status: AC
  Filled 2014-11-06: qty 1000

## 2014-11-06 MED ORDER — FENTANYL CITRATE (PF) 100 MCG/2ML IJ SOLN
INTRAMUSCULAR | Status: DC | PRN
  Administered 2014-11-06: 25 ug via INTRAVENOUS
  Administered 2014-11-06: 50 ug via INTRAVENOUS

## 2014-11-06 MED ORDER — HEPARIN SODIUM (PORCINE) 1000 UNIT/ML IJ SOLN
INTRAMUSCULAR | Status: AC
  Filled 2014-11-06: qty 1

## 2014-11-06 MED ORDER — DEXTROSE 50 % IV SOLN
1.0000 | Freq: Once | INTRAVENOUS | Status: AC
  Administered 2014-11-06: 50 mL via INTRAVENOUS

## 2014-11-06 MED ORDER — DEXTROSE 5 % IV SOLN
1.5000 g | INTRAVENOUS | Status: AC
  Administered 2014-11-06: 1.5 g via INTRAVENOUS

## 2014-11-06 MED ORDER — INSULIN ASPART 100 UNIT/ML ~~LOC~~ SOLN: 0.0000 [IU] | SUBCUTANEOUS | Status: DC

## 2014-11-06 MED ORDER — DEXTROSE 5 % IV SOLN
INTRAVENOUS | Status: AC
  Filled 2014-11-06: qty 1.5

## 2014-11-06 MED ORDER — HEPARIN SODIUM (PORCINE) 1000 UNIT/ML IJ SOLN
INTRAMUSCULAR | Status: DC | PRN
  Administered 2014-11-06: 3000 [IU] via INTRAVENOUS

## 2014-11-06 MED ORDER — IOHEXOL 300 MG/ML  SOLN
INTRAMUSCULAR | Status: DC | PRN
  Administered 2014-11-06: 20 mL via INTRAVENOUS

## 2014-11-06 MED ORDER — MIDAZOLAM HCL 2 MG/2ML IJ SOLN
INTRAMUSCULAR | Status: DC | PRN
  Administered 2014-11-06: 1 mg via INTRAVENOUS
  Administered 2014-11-06: 2 mg via INTRAVENOUS

## 2014-11-06 MED ORDER — SODIUM CHLORIDE 0.9 % IV SOLN
INTRAVENOUS | Status: DC
  Administered 2014-11-06 (×2): via INTRAVENOUS

## 2014-11-06 MED ORDER — FENTANYL CITRATE (PF) 100 MCG/2ML IJ SOLN
INTRAMUSCULAR | Status: AC
  Filled 2014-11-06: qty 2

## 2014-11-06 SURGICAL SUPPLY — 9 items
BALLN DORADO 8X60X80 (BALLOONS) ×3
BALLOON DORADO 8X60X80 (BALLOONS) ×1 IMPLANT
DEVICE PRESTO INFLATION (MISCELLANEOUS) ×3 IMPLANT
DRAPE BRACHIAL (DRAPES) ×3 IMPLANT
PACK ANGIOGRAPHY (CUSTOM PROCEDURE TRAY) ×3 IMPLANT
SET INTRO CAPELLA COAXIAL (SET/KITS/TRAYS/PACK) ×3 IMPLANT
SHEATH BRITE TIP 6FRX5.5 (SHEATH) ×3 IMPLANT
TOWEL OR 17X26 4PK STRL BLUE (TOWEL DISPOSABLE) ×3 IMPLANT
WIRE MAGIC TOR.035 180C (WIRE) ×3 IMPLANT

## 2014-11-06 NOTE — H&P (Signed)
[facility] History & Physical Update  The patient was interviewed and re-examined.  The patient's previous History and Physical has been reviewed and is unchanged.  There is no change in the plan of care. We plan to proceed with the scheduled procedure.  Schnier, Latina CraverGregory G, MD  11/06/2014, 4:17 PM

## 2014-11-06 NOTE — Discharge Instructions (Signed)
Fistulogram, Care After °Refer to this sheet in the next few weeks. These instructions provide you with information on caring for yourself after your procedure. Your health care provider may also give you more specific instructions. Your treatment has been planned according to current medical practices, but problems sometimes occur. Call your health care provider if you have any problems or questions after your procedure. °WHAT TO EXPECT AFTER THE PROCEDURE °After your procedure, it is typical to have the following: °· A small amount of discomfort in the area where the catheters were placed. °· A small amount of bruising around the fistula. °· Sleepiness and fatigue. °HOME CARE INSTRUCTIONS °· Rest at home for the day following your procedure. °· Do not drive or operate heavy machinery while taking pain medicine. °· Take medicines only as directed by your health care provider. °· Do not take baths, swim, or use a hot tub until your health care provider approves. You may shower 24 hours after the procedure or as directed by your health care provider. °· There are many different ways to close and cover an incision, including stitches, skin glue, and adhesive strips. Follow your health care provider's instructions on: °¨ Incision care. °¨ Bandage (dressing) changes and removal. °¨ Incision closure removal. °· Monitor your dialysis fistula carefully. °SEEK MEDICAL CARE IF: °· You have drainage, redness, swelling, or pain at your catheter site. °· You have a fever. °· You have chills. °SEEK IMMEDIATE MEDICAL CARE IF: °· You feel weak. °· You have trouble balancing. °· You have trouble moving your arms or legs. °· You have problems with your speech or vision. °· You can no longer feel a vibration or buzz when you put your fingers over your dialysis fistula. °· The limb that was used for the procedure: °¨ Swells. °¨ Is painful. °¨ Is cold. °¨ Is discolored, such as blue or pale white. °  °This information is not intended  to replace advice given to you by your health care provider. Make sure you discuss any questions you have with your health care provider. °  °Document Released: 05/22/2013 Document Reviewed: 05/22/2013 °Elsevier Interactive Patient Education ©2016 [employer] ° °

## 2014-11-06 NOTE — Op Note (Signed)
OPERATIVE NOTE   PROCEDURE: 1. Contrast injection left arm brachial axillary dialysis graft 2. Percutaneous transluminal angioplasty to 8 mm venous portion left arm brachial axillary dialysis graft  PRE-OPERATIVE DIAGNOSIS: Complication of dialysis access                                                       End Stage Renal Disease  POST-OPERATIVE DIAGNOSIS: same as above   SURGEON: Katha Cabal, M.D.  ANESTHESIA: Conscious Sedation   ESTIMATED BLOOD LOSS: minimal  FINDING(S): 1. Strictures within the venous cannulation zone of the AV graft  SPECIMEN(S):  None  CONTRAST: 20 cc  FLUOROSCOPY TIME: 0.9 minutes  INDICATIONS: Savannah Key is a 74 y.o. female who  presents with malfunctioning left arm brachial axillary AV access.  The patient is scheduled for angiography with possible intervention of the AV access.  The patient is aware the risks include but are not limited to: bleeding, infection, thrombosis of the cannulated access, and possible anaphylactic reaction to the contrast.  The patient acknowledges if the access can not be salvaged a tunneled catheter will be needed and will be placed during this procedure.  The patient is aware of the risks of the procedure and elects to proceed with the angiogram and intervention.  DESCRIPTION: After full informed written consent was obtained, the patient was brought back to the Special Procedure suite and placed supine position.  Appropriate cardiopulmonary monitors were placed.  The left arm was prepped and draped in the standard fashion.  Appropriate timeout is called. The AV graft  was cannulated with a micropuncture needle.  The microwire was advanced and the needle was exchanged for  a microsheath.  The J-wire was then advanced and a 6 Fr sheath inserted.  Hand injections were completed to image the access from the arterial anastomosis through the entire access.  The central venous structures were also imaged by hand  injections.  Based on the images,  the area of the AV graft which provides access for venous cannulation is diffusely stenotic greater than 80%. The previously placed flair stent is widely patent with no evidence of hyperplasia or recurrence within the confluence of the axillary vein and the stent. The axillary subclavian innominate and superior vena cava is widely patent. Reflux of contrast demonstrates the arterial anastomosis is patent previously placed clips for the banding are intact and there is a smooth contour to the graft at the level of the anastomosis. Visualized portions of the brachial artery widely patent.  3000 units of heparin is given a Magic torque wires advanced and an 8 x 6 [place] balloon is used to angioplasty the venous portion of the graft 2 separate inflations are created both to 16 atm both for approximately 1.5 minutes. Follow-up imaging demonstrates an excellent result with resolution of the previously noted stenotic areas..    A 4-0 Monocryl purse-string suture was sewn around the sheath.  The sheath was removed and light pressure was applied.  A sterile bandage was applied to the puncture site.    COMPLICATIONS: None  CONDITION: Savannah Key, M.D [facility] Office: [phone]  11/06/2014 4:18 PM

## 2014-11-07 ENCOUNTER — Encounter: Payer: Self-pay | Admitting: Vascular Surgery

## 2014-12-07 ENCOUNTER — Emergency Department: Payer: Medicare (Managed Care)

## 2014-12-07 ENCOUNTER — Inpatient Hospital Stay
Admission: EM | Admit: 2014-12-07 | Discharge: 2014-12-19 | DRG: 291 | Disposition: A | Discharge status: Home / Self Care | Payer: Medicare (Managed Care) | Attending: Internal Medicine | Admitting: Internal Medicine

## 2014-12-07 ENCOUNTER — Encounter: Payer: Self-pay | Admitting: Emergency Medicine

## 2014-12-07 DIAGNOSIS — N2581 Secondary hyperparathyroidism of renal origin: Secondary | ICD-10-CM | POA: Diagnosis present

## 2014-12-07 DIAGNOSIS — Z794 Long term (current) use of insulin: Secondary | ICD-10-CM | POA: Diagnosis not present

## 2014-12-07 DIAGNOSIS — I132 Hypertensive heart and chronic kidney disease with heart failure and with stage 5 chronic kidney disease, or end stage renal disease: Secondary | ICD-10-CM | POA: Diagnosis present

## 2014-12-07 DIAGNOSIS — E1140 Type 2 diabetes mellitus with diabetic neuropathy, unspecified: Secondary | ICD-10-CM | POA: Diagnosis present

## 2014-12-07 DIAGNOSIS — I5023 Acute on chronic systolic (congestive) heart failure: Secondary | ICD-10-CM | POA: Diagnosis present

## 2014-12-07 DIAGNOSIS — I429 Cardiomyopathy, unspecified: Secondary | ICD-10-CM | POA: Diagnosis present

## 2014-12-07 DIAGNOSIS — G2581 Restless legs syndrome: Secondary | ICD-10-CM | POA: Diagnosis present

## 2014-12-07 DIAGNOSIS — J209 Acute bronchitis, unspecified: Secondary | ICD-10-CM | POA: Diagnosis present

## 2014-12-07 DIAGNOSIS — K7460 Unspecified cirrhosis of liver: Secondary | ICD-10-CM | POA: Diagnosis present

## 2014-12-07 DIAGNOSIS — J189 Pneumonia, unspecified organism: Secondary | ICD-10-CM | POA: Diagnosis present

## 2014-12-07 DIAGNOSIS — E669 Obesity, unspecified: Secondary | ICD-10-CM | POA: Diagnosis present

## 2014-12-07 DIAGNOSIS — R0602 Shortness of breath: Secondary | ICD-10-CM

## 2014-12-07 DIAGNOSIS — Z6837 Body mass index (BMI) 37.0-37.9, adult: Secondary | ICD-10-CM

## 2014-12-07 DIAGNOSIS — E1165 Type 2 diabetes mellitus with hyperglycemia: Secondary | ICD-10-CM | POA: Diagnosis present

## 2014-12-07 DIAGNOSIS — I252 Old myocardial infarction: Secondary | ICD-10-CM | POA: Diagnosis not present

## 2014-12-07 DIAGNOSIS — D631 Anemia in chronic kidney disease: Secondary | ICD-10-CM | POA: Diagnosis present

## 2014-12-07 DIAGNOSIS — B370 Candidal stomatitis: Secondary | ICD-10-CM | POA: Diagnosis present

## 2014-12-07 DIAGNOSIS — F329 Major depressive disorder, single episode, unspecified: Secondary | ICD-10-CM | POA: Diagnosis present

## 2014-12-07 DIAGNOSIS — Z7722 Contact with and (suspected) exposure to environmental tobacco smoke (acute) (chronic): Secondary | ICD-10-CM | POA: Diagnosis present

## 2014-12-07 DIAGNOSIS — E039 Hypothyroidism, unspecified: Secondary | ICD-10-CM | POA: Diagnosis present

## 2014-12-07 DIAGNOSIS — I5043 Acute on chronic combined systolic (congestive) and diastolic (congestive) heart failure: Secondary | ICD-10-CM | POA: Diagnosis present

## 2014-12-07 DIAGNOSIS — K219 Gastro-esophageal reflux disease without esophagitis: Secondary | ICD-10-CM | POA: Diagnosis present

## 2014-12-07 DIAGNOSIS — I5033 Acute on chronic diastolic (congestive) heart failure: Secondary | ICD-10-CM | POA: Diagnosis present

## 2014-12-07 DIAGNOSIS — M1990 Unspecified osteoarthritis, unspecified site: Secondary | ICD-10-CM | POA: Diagnosis present

## 2014-12-07 DIAGNOSIS — J40 Bronchitis, not specified as acute or chronic: Secondary | ICD-10-CM | POA: Diagnosis not present

## 2014-12-07 DIAGNOSIS — E1122 Type 2 diabetes mellitus with diabetic chronic kidney disease: Secondary | ICD-10-CM | POA: Diagnosis present

## 2014-12-07 DIAGNOSIS — I2510 Atherosclerotic heart disease of native coronary artery without angina pectoris: Secondary | ICD-10-CM | POA: Diagnosis present

## 2014-12-07 DIAGNOSIS — J440 Chronic obstructive pulmonary disease with acute lower respiratory infection: Secondary | ICD-10-CM | POA: Diagnosis present

## 2014-12-07 DIAGNOSIS — R0600 Dyspnea, unspecified: Secondary | ICD-10-CM | POA: Diagnosis not present

## 2014-12-07 DIAGNOSIS — Z8249 Family history of ischemic heart disease and other diseases of the circulatory system: Secondary | ICD-10-CM | POA: Diagnosis not present

## 2014-12-07 DIAGNOSIS — R918 Other nonspecific abnormal finding of lung field: Secondary | ICD-10-CM | POA: Diagnosis not present

## 2014-12-07 DIAGNOSIS — K5900 Constipation, unspecified: Secondary | ICD-10-CM | POA: Diagnosis present

## 2014-12-07 DIAGNOSIS — R05 Cough: Secondary | ICD-10-CM | POA: Diagnosis not present

## 2014-12-07 DIAGNOSIS — N186 End stage renal disease: Secondary | ICD-10-CM | POA: Diagnosis present

## 2014-12-07 DIAGNOSIS — R079 Chest pain, unspecified: Secondary | ICD-10-CM

## 2014-12-07 DIAGNOSIS — J45909 Unspecified asthma, uncomplicated: Secondary | ICD-10-CM | POA: Diagnosis present

## 2014-12-07 HISTORY — DX: Heart failure, unspecified: I50.9

## 2014-12-07 LAB — BASIC METABOLIC PANEL
Anion gap: 9 (ref 5–15)
BUN: 15 mg/dL (ref 6–20)
CALCIUM: 8 mg/dL — AB (ref 8.9–10.3)
CO2: 28 mmol/L (ref 22–32)
CREATININE: 2.09 mg/dL — AB (ref 0.44–1.00)
Chloride: 101 mmol/L (ref 101–111)
GFR calc non Af Amer: 22 mL/min — ABNORMAL LOW (ref >60)
GFR, EST AFRICAN AMERICAN: 26 mL/min — AB (ref >60)
Glucose, Bld: 130 mg/dL — ABNORMAL HIGH (ref 65–99)
Potassium: 3.8 mmol/L (ref 3.5–5.1)
SODIUM: 138 mmol/L (ref 135–145)

## 2014-12-07 LAB — BRAIN NATRIURETIC PEPTIDE: B NATRIURETIC PEPTIDE 5: 1105 pg/mL — AB (ref 0.0–100.0)

## 2014-12-07 LAB — CBC
HCT: 29.1 % — ABNORMAL LOW (ref 35.0–47.0)
Hemoglobin: 9.4 g/dL — ABNORMAL LOW (ref 12.0–16.0)
MCH: 27.3 pg (ref 26.0–34.0)
MCHC: 32.5 g/dL (ref 32.0–36.0)
MCV: 84.2 fL (ref 80.0–100.0)
PLATELETS: 89 10*3/uL — AB (ref 150–440)
RBC: 3.45 MIL/uL — AB (ref 3.80–5.20)
RDW: 21.5 % — ABNORMAL HIGH (ref 11.5–14.5)
WBC: 7.7 10*3/uL (ref 3.6–11.0)

## 2014-12-07 LAB — TROPONIN I: TROPONIN I: 0.07 ng/mL — AB (ref <0.031)

## 2014-12-07 MED ORDER — ATORVASTATIN CALCIUM 20 MG PO TABS
80.0000 mg | ORAL_TABLET | Freq: Every day | ORAL | Status: DC
  Administered 2014-12-08 – 2014-12-18 (×12): 80 mg via ORAL
  Filled 2014-12-07 (×12): qty 4

## 2014-12-07 MED ORDER — ACETAMINOPHEN 325 MG PO TABS
650.0000 mg | ORAL_TABLET | Freq: Four times a day (QID) | ORAL | Status: DC | PRN
  Administered 2014-12-08 – 2014-12-12 (×3): 650 mg via ORAL
  Filled 2014-12-07 (×3): qty 2

## 2014-12-07 MED ORDER — SODIUM CHLORIDE 0.9 % IJ SOLN
3.0000 mL | Freq: Two times a day (BID) | INTRAMUSCULAR | Status: DC
  Administered 2014-12-07 – 2014-12-19 (×23): 3 mL via INTRAVENOUS

## 2014-12-07 MED ORDER — METHYLPREDNISOLONE SODIUM SUCC 125 MG IJ SOLR
125.0000 mg | Freq: Once | INTRAMUSCULAR | Status: AC
  Administered 2014-12-07: 125 mg via INTRAVENOUS
  Filled 2014-12-07: qty 2

## 2014-12-07 MED ORDER — PREGABALIN 75 MG PO CAPS
75.0000 mg | ORAL_CAPSULE | Freq: Every day | ORAL | Status: DC
  Administered 2014-12-08 – 2014-12-18 (×12): 75 mg via ORAL
  Filled 2014-12-07 (×12): qty 1

## 2014-12-07 MED ORDER — ROPINIROLE HCL 1 MG PO TABS
2.0000 mg | ORAL_TABLET | Freq: Every day | ORAL | Status: DC
  Administered 2014-12-08 – 2014-12-18 (×12): 2 mg via ORAL
  Filled 2014-12-07 (×12): qty 2

## 2014-12-07 MED ORDER — BENZONATATE 100 MG PO CAPS
100.0000 mg | ORAL_CAPSULE | Freq: Three times a day (TID) | ORAL | Status: DC | PRN
  Administered 2014-12-08 – 2014-12-18 (×8): 100 mg via ORAL
  Filled 2014-12-07 (×9): qty 1

## 2014-12-07 MED ORDER — DOCUSATE SODIUM 100 MG PO CAPS
100.0000 mg | ORAL_CAPSULE | Freq: Every day | ORAL | Status: DC
  Administered 2014-12-08 – 2014-12-18 (×13): 100 mg via ORAL
  Filled 2014-12-07 (×13): qty 1

## 2014-12-07 MED ORDER — CLOPIDOGREL BISULFATE 75 MG PO TABS
75.0000 mg | ORAL_TABLET | Freq: Every day | ORAL | Status: DC
  Administered 2014-12-08 – 2014-12-18 (×11): 75 mg via ORAL
  Filled 2014-12-07 (×11): qty 1

## 2014-12-07 MED ORDER — MORPHINE SULFATE (PF) 2 MG/ML IV SOLN
2.0000 mg | INTRAVENOUS | Status: DC | PRN
  Administered 2014-12-13: 2 mg via INTRAVENOUS
  Filled 2014-12-07: qty 1

## 2014-12-07 MED ORDER — ACETAMINOPHEN 500 MG PO TABS
1000.0000 mg | ORAL_TABLET | Freq: Once | ORAL | Status: AC
  Administered 2014-12-07: 1000 mg via ORAL
  Filled 2014-12-07: qty 2

## 2014-12-07 MED ORDER — IPRATROPIUM-ALBUTEROL 0.5-2.5 (3) MG/3ML IN SOLN
3.0000 mL | Freq: Once | RESPIRATORY_TRACT | Status: AC
  Administered 2014-12-07: 3 mL via RESPIRATORY_TRACT
  Filled 2014-12-07: qty 3

## 2014-12-07 MED ORDER — PANTOPRAZOLE SODIUM 40 MG PO TBEC
40.0000 mg | DELAYED_RELEASE_TABLET | Freq: Two times a day (BID) | ORAL | Status: DC
  Administered 2014-12-08 – 2014-12-19 (×23): 40 mg via ORAL
  Filled 2014-12-07 (×21): qty 1

## 2014-12-07 MED ORDER — VENLAFAXINE HCL ER 37.5 MG PO CP24
37.5000 mg | ORAL_CAPSULE | Freq: Every day | ORAL | Status: DC
  Administered 2014-12-08 – 2014-12-18 (×12): 37.5 mg via ORAL
  Filled 2014-12-07 (×12): qty 1

## 2014-12-07 MED ORDER — LORATADINE 10 MG PO TABS
10.0000 mg | ORAL_TABLET | Freq: Every day | ORAL | Status: DC
  Administered 2014-12-08 – 2014-12-19 (×12): 10 mg via ORAL
  Filled 2014-12-07 (×12): qty 1

## 2014-12-07 MED ORDER — FUROSEMIDE 10 MG/ML IJ SOLN
40.0000 mg | Freq: Once | INTRAMUSCULAR | Status: DC
  Filled 2014-12-07: qty 4

## 2014-12-07 MED ORDER — CEPHALEXIN 250 MG PO CAPS
250.0000 mg | ORAL_CAPSULE | Freq: Every day | ORAL | Status: DC
  Administered 2014-12-08 (×2): 250 mg via ORAL
  Filled 2014-12-07 (×2): qty 1

## 2014-12-07 MED ORDER — SALINE SPRAY 0.65 % NA SOLN: 1.0000 | NASAL | Status: DC | PRN

## 2014-12-07 MED ORDER — LEVOTHYROXINE SODIUM 75 MCG PO TABS
75.0000 ug | ORAL_TABLET | Freq: Every day | ORAL | Status: DC
  Administered 2014-12-08 – 2014-12-19 (×12): 75 ug via ORAL
  Filled 2014-12-07 (×11): qty 1

## 2014-12-07 MED ORDER — IPRATROPIUM-ALBUTEROL 0.5-2.5 (3) MG/3ML IN SOLN
3.0000 mL | RESPIRATORY_TRACT | Status: DC | PRN
Start: 2014-12-07 — End: 2014-12-09
  Administered 2014-12-08: 3 mL via RESPIRATORY_TRACT
  Filled 2014-12-07: qty 3

## 2014-12-07 MED ORDER — ONDANSETRON HCL 4 MG PO TABS
4.0000 mg | ORAL_TABLET | Freq: Four times a day (QID) | ORAL | Status: DC | PRN
  Administered 2014-12-10: 4 mg via ORAL
  Filled 2014-12-07: qty 1

## 2014-12-07 MED ORDER — INSULIN GLARGINE 100 UNIT/ML ~~LOC~~ SOLN
90.0000 [IU] | Freq: Every day | SUBCUTANEOUS | Status: DC
  Administered 2014-12-08 – 2014-12-14 (×7): 90 [IU] via SUBCUTANEOUS
  Filled 2014-12-07 (×7): qty 0.9

## 2014-12-07 MED ORDER — INSULIN ASPART 100 UNIT/ML ~~LOC~~ SOLN
0.0000 [IU] | Freq: Three times a day (TID) | SUBCUTANEOUS | Status: DC
  Administered 2014-12-08: 5 [IU] via SUBCUTANEOUS
  Administered 2014-12-08: 9 [IU] via SUBCUTANEOUS
  Administered 2014-12-08 – 2014-12-09 (×2): 3 [IU] via SUBCUTANEOUS
  Administered 2014-12-09: 9 [IU] via SUBCUTANEOUS
  Administered 2014-12-10: 2 [IU] via SUBCUTANEOUS
  Administered 2014-12-10: 5 [IU] via SUBCUTANEOUS
  Administered 2014-12-11: 1 [IU] via SUBCUTANEOUS
  Administered 2014-12-11: 5 [IU] via SUBCUTANEOUS
  Administered 2014-12-12: 7 [IU] via SUBCUTANEOUS
  Administered 2014-12-12: 3 [IU] via SUBCUTANEOUS
  Administered 2014-12-13: 9 [IU] via SUBCUTANEOUS
  Administered 2014-12-13: 3 [IU] via SUBCUTANEOUS
  Administered 2014-12-13: 9 [IU] via SUBCUTANEOUS
  Administered 2014-12-14 (×2): 7 [IU] via SUBCUTANEOUS
  Administered 2014-12-15: 2 [IU] via SUBCUTANEOUS
  Administered 2014-12-15: 7 [IU] via SUBCUTANEOUS
  Administered 2014-12-15: 5 [IU] via SUBCUTANEOUS
  Administered 2014-12-16: 9 [IU] via SUBCUTANEOUS
  Administered 2014-12-16: 20 [IU] via SUBCUTANEOUS
  Filled 2014-12-07: qty 2
  Filled 2014-12-07: qty 7
  Filled 2014-12-07: qty 9
  Filled 2014-12-07: qty 7
  Filled 2014-12-07: qty 1
  Filled 2014-12-07: qty 9
  Filled 2014-12-07: qty 3
  Filled 2014-12-07: qty 2
  Filled 2014-12-07 (×2): qty 9
  Filled 2014-12-07: qty 20
  Filled 2014-12-07: qty 5
  Filled 2014-12-07: qty 3
  Filled 2014-12-07: qty 9
  Filled 2014-12-07 (×3): qty 5
  Filled 2014-12-07: qty 3
  Filled 2014-12-07: qty 7
  Filled 2014-12-07: qty 2

## 2014-12-07 MED ORDER — CALCIUM ACETATE (PHOS BINDER) 667 MG PO CAPS
667.0000 mg | ORAL_CAPSULE | Freq: Three times a day (TID) | ORAL | Status: DC
  Administered 2014-12-08 – 2014-12-19 (×30): 667 mg via ORAL
  Filled 2014-12-07 (×29): qty 1

## 2014-12-07 MED ORDER — FUROSEMIDE 10 MG/ML IJ SOLN
40.0000 mg | Freq: Once | INTRAMUSCULAR | Status: AC
  Administered 2014-12-07: 40 mg via INTRAVENOUS
  Filled 2014-12-07: qty 4

## 2014-12-07 MED ORDER — ONDANSETRON HCL 4 MG/2ML IJ SOLN
4.0000 mg | Freq: Once | INTRAMUSCULAR | Status: AC
  Administered 2014-12-07: 4 mg via INTRAVENOUS
  Filled 2014-12-07: qty 2

## 2014-12-07 MED ORDER — ACETAMINOPHEN 650 MG RE SUPP: 650.0000 mg | Freq: Four times a day (QID) | RECTAL | Status: DC | PRN

## 2014-12-07 MED ORDER — INSULIN ASPART 100 UNIT/ML ~~LOC~~ SOLN
0.0000 [IU] | Freq: Every day | SUBCUTANEOUS | Status: DC
  Administered 2014-12-08: 2 [IU] via SUBCUTANEOUS
  Administered 2014-12-08: 4 [IU] via SUBCUTANEOUS
  Administered 2014-12-10 – 2014-12-11 (×2): 3 [IU] via SUBCUTANEOUS
  Administered 2014-12-13: 5 [IU] via SUBCUTANEOUS
  Administered 2014-12-15: 4 [IU] via SUBCUTANEOUS
  Filled 2014-12-07: qty 4
  Filled 2014-12-07: qty 3
  Filled 2014-12-07: qty 4
  Filled 2014-12-07: qty 2
  Filled 2014-12-07: qty 5
  Filled 2014-12-07: qty 3

## 2014-12-07 MED ORDER — OXYCODONE HCL 5 MG PO TABS
5.0000 mg | ORAL_TABLET | ORAL | Status: DC | PRN
  Administered 2014-12-08 – 2014-12-18 (×15): 5 mg via ORAL
  Filled 2014-12-07 (×15): qty 1

## 2014-12-07 MED ORDER — ISOSORBIDE MONONITRATE ER 60 MG PO TB24
60.0000 mg | ORAL_TABLET | Freq: Every day | ORAL | Status: DC
  Administered 2014-12-11 – 2014-12-18 (×6): 60 mg via ORAL
  Filled 2014-12-07 (×9): qty 1

## 2014-12-07 MED ORDER — ONDANSETRON HCL 4 MG/2ML IJ SOLN
4.0000 mg | Freq: Four times a day (QID) | INTRAMUSCULAR | Status: DC | PRN
  Administered 2014-12-08 – 2014-12-14 (×7): 4 mg via INTRAVENOUS
  Filled 2014-12-07 (×7): qty 2

## 2014-12-07 MED ORDER — ASPIRIN EC 81 MG PO TBEC
81.0000 mg | DELAYED_RELEASE_TABLET | Freq: Every day | ORAL | Status: DC
  Administered 2014-12-08 – 2014-12-18 (×11): 81 mg via ORAL
  Filled 2014-12-07 (×11): qty 1

## 2014-12-07 MED ORDER — HEPARIN SODIUM (PORCINE) 5000 UNIT/ML IJ SOLN
5000.0000 [IU] | Freq: Three times a day (TID) | INTRAMUSCULAR | Status: DC
  Administered 2014-12-08 – 2014-12-19 (×29): 5000 [IU] via SUBCUTANEOUS
  Filled 2014-12-07 (×30): qty 1

## 2014-12-07 MED ORDER — FAMOTIDINE 20 MG PO TABS
10.0000 mg | ORAL_TABLET | Freq: Every day | ORAL | Status: DC
  Administered 2014-12-08 – 2014-12-19 (×12): 10 mg via ORAL
  Filled 2014-12-07 (×2): qty 1
  Filled 2014-12-07: qty 2
  Filled 2014-12-07 (×9): qty 1

## 2014-12-07 MED ORDER — TRAZODONE HCL 50 MG PO TABS
50.0000 mg | ORAL_TABLET | Freq: Every day | ORAL | Status: DC
  Administered 2014-12-08 – 2014-12-18 (×12): 50 mg via ORAL
  Filled 2014-12-07 (×12): qty 1

## 2014-12-07 MED ORDER — NITROGLYCERIN 0.4 MG SL SUBL: 0.4000 mg | SUBLINGUAL_TABLET | SUBLINGUAL | Status: DC | PRN

## 2014-12-07 MED ORDER — ALBUTEROL SULFATE (2.5 MG/3ML) 0.083% IN NEBU
5.0000 mg | INHALATION_SOLUTION | Freq: Once | RESPIRATORY_TRACT | Status: AC
  Administered 2014-12-07: 5 mg via RESPIRATORY_TRACT
  Filled 2014-12-07: qty 6

## 2014-12-07 NOTE — ED Notes (Signed)
Pt using call bell; c/o nausea, chest pain and headache; repositioned with assistance of Marylu LundJanet, ED tech; will notify MD of pt's complaints

## 2014-12-07 NOTE — ED Provider Notes (Signed)
[facility] Emergency Department Provider Note  ____________________________________________  Time seen: 6:25 PM  I have reviewed the triage vital signs and the nursing notes.   HISTORY  Chief Complaint Shortness of Breath    HPI Savannah Key is a 74 y.o. female who complains of shortness of breath with some achy intermittent nonradiating chest pain today. Shortness of breath has been going on for about 2 weeks, gradually worsening and associated with a nonproductive cough. She also has decreased exercise tolerance despite using her walker as usual. She states that she can barely walk a few steps. No fevers or chills, abdominal pain or back pain. No vomiting.  She does have a history of an STEMI 3 weeks ago that was managed with medications only. She is on dialysis Monday Wednesday Friday and had her dialysis today as usual.     Past Medical History  Diagnosis Date  . Chronic kidney disease   . GERD (gastroesophageal reflux disease)   . Carotid artery stenosis   . Coronary artery disease   . Hypothyroidism   . Diabetes mellitus without complication (HCC)   . Depression   . Arthritis   . Kidney failure   . Liver failure (HCC)   . MI (myocardial infarction) (HCC)   . HTN (hypertension)   . Broken leg     R     Patient Active Problem List   Diagnosis Date Noted  . CHF (congestive heart failure) (HCC) 10/03/2014  . Respiratory failure with hypoxia (HCC) 10/01/2014     Past Surgical History  Procedure Laterality Date  . Av fistula placement Left   . Cholecystectomy    . Peripheral vascular catheterization N/A 06/11/2014    Procedure: A/V Shuntogram/Fistulagram;  Surgeon: Annice Needy, MD;  Location: [facility] INVASIVE CV LAB;  Service: Cardiovascular;  Laterality: N/A;  . Peripheral vascular catheterization Left 06/11/2014    Procedure: A/V Shunt Intervention;  Surgeon: Annice Needy, MD;  Location: [facility] INVASIVE CV LAB;  Service: Cardiovascular;   Laterality: Left;  . Open reduction internal fixation (orif) distal radial fracture Left 08/09/2014    Procedure: OPEN REDUCTION INTERNAL FIXATION (ORIF) DISTAL RADIAL FRACTURE;  Surgeon: Kennedy Bucker, MD;  Location: [facility] ORS;  Service: Orthopedics;  Laterality: Left;  . Peripheral vascular catheterization Left 11/06/2014    Procedure: A/V Shuntogram/Fistulagram;  Surgeon: Renford Dills, MD;  Location: [facility] INVASIVE CV LAB;  Service: Cardiovascular;  Laterality: Left;  . Peripheral vascular catheterization Left 11/06/2014    Procedure: A/V Shunt Intervention;  Surgeon: Renford Dills, MD;  Location: [facility] INVASIVE CV LAB;  Service: Cardiovascular;  Laterality: Left;     Current Outpatient Rx  Name  Route  Sig  Dispense  Refill  . aspirin EC 81 MG tablet   Oral   Take 81 mg by mouth daily.          Marland Kitchen atorvastatin (LIPITOR) 80 MG tablet   Oral   Take 80 mg by mouth at bedtime.         . benzonatate (TESSALON) 100 MG capsule   Oral   Take 100 mg by mouth 3 (three) times daily as needed for cough.         . calcium acetate (PHOSLO) 667 MG capsule   Oral   Take 667 mg by mouth 3 (three) times daily with meals.         . cephALEXin (KEFLEX) 250 MG capsule   Oral   Take 250 mg by mouth  at bedtime.          . cetirizine (ZYRTEC) 5 MG tablet   Oral   Take 5 mg by mouth at bedtime.          . clopidogrel (PLAVIX) 75 MG tablet   Oral   Take 1 tablet (75 mg total) by mouth daily.   30 tablet   0   . docusate sodium (COLACE) 100 MG capsule   Oral   Take 100 mg by mouth at bedtime.         . insulin glargine (LANTUS) 100 UNIT/ML injection   Subcutaneous   Inject 90 Units into the skin daily.          . insulin NPH Human (HUMULIN N,NOVOLIN N) 100 UNIT/ML injection   Subcutaneous   Inject 15 Units into the skin 3 (three) times daily before meals.          . isosorbide mononitrate (IMDUR) 60 MG 24 hr tablet   Oral   Take 60 mg by mouth daily.           Marland Kitchen levothyroxine (SYNTHROID, LEVOTHROID) 75 MCG tablet   Oral   Take 75 mcg by mouth daily.         Marland Kitchen loperamide (IMODIUM) 2 MG capsule   Oral   Take 2 mg by mouth as needed for diarrhea or loose stools.         . nitroGLYCERIN (NITROSTAT) 0.4 MG SL tablet   Sublingual   Place 0.4 mg under the tongue every 5 (five) minutes as needed for chest pain.         . pantoprazole (PROTONIX) 40 MG tablet   Oral   Take 40 mg by mouth 2 (two) times daily.         . pregabalin (LYRICA) 75 MG capsule   Oral   Take 75 mg by mouth at bedtime.         . ranitidine (ZANTAC) 150 MG tablet   Oral   Take 150 mg by mouth at bedtime.         Marland Kitchen rOPINIRole (REQUIP) 2 MG tablet   Oral   Take 2 mg by mouth at bedtime.         . sodium chloride (OCEAN) 0.65 % SOLN nasal spray   Each Nare   Place 1 spray into both nostrils as needed for congestion.         . traZODone (DESYREL) 50 MG tablet   Oral   Take 50 mg by mouth at bedtime.         Marland Kitchen venlafaxine XR (EFFEXOR-XR) 37.5 MG 24 hr capsule   Oral   Take 37.5 mg by mouth at bedtime.         Marland Kitchen oxyCODONE-acetaminophen (ROXICET) 5-325 MG per tablet   Oral   Take 1 tablet by mouth every 8 (eight) hours as needed for moderate pain or severe pain (Do not drive or operate heavy machinery while taking as can cause drowsiness.). Patient not taking: Reported on 12/07/2014   9 tablet   0      Allergies Sulfa antibiotics   No family history on file.  Social History Social History  Substance Use Topics  . Smoking status: Never Smoker   . Smokeless tobacco: Never Used  . Alcohol Use: No    Review of Systems  Constitutional:   No fever or chills. No weight changes Eyes:   No blurry vision or double vision.  ENT:  No sore throat. Cardiovascular:   Positive chest pain. Respiratory:   Positive dyspnea with nonproductive cough. Gastrointestinal:   Negative for abdominal pain, vomiting and diarrhea.  No BRBPR or  melena. Genitourinary:   Negative for dysuria, urinary retention, bloody urine, or difficulty urinating. Musculoskeletal:   Negative for back pain. No joint swelling or pain. Skin:   Negative for rash. Neurological:   Negative for headaches, focal weakness or numbness. Psychiatric:  No anxiety or depression.   Endocrine:  No hot/cold intolerance, changes in energy, or sleep difficulty.  10-point ROS otherwise negative.  ____________________________________________   PHYSICAL EXAM:  VITAL SIGNS: ED Triage Vitals  Enc Vitals Group     BP 12/07/14 1815 102/78 mmHg     Pulse Rate 12/07/14 1802 89     Resp 12/07/14 1802 22     Temp 12/07/14 1802 98.8 F (37.1 C)     Temp Source 12/07/14 1802 Oral     SpO2 12/07/14 1802 95 %     Weight 12/07/14 1802 203 lb 4.2 oz (92.2 kg)     Height 12/07/14 1802 5' (1.524 m)     Head Cir --      Peak Flow --      Pain Score 12/07/14 1805 8     Pain Loc --      Pain Edu? --      Excl. in GC? --      Constitutional:   Alert and oriented. Well appearing and in no distress. Eyes:   No scleral icterus. No conjunctival pallor. PERRL. EOMI ENT   Head:   Normocephalic and atraumatic.   Nose:   No congestion/rhinnorhea. No septal hematoma   Mouth/Throat:   MMM, no pharyngeal erythema. No peritonsillar mass. No uvula shift.   Neck:   No stridor. No SubQ emphysema. No meningismus. Hematological/Lymphatic/Immunilogical:   No cervical lymphadenopathy. Cardiovascular:   RRR. Normal and symmetric distal pulses are present in all extremities. No murmurs, rubs, or gallops. Respiratory:  Tachypnea, good air entry diffusely, crackles in the right base. Expiratory wheezing diffusely Gastrointestinal:   Soft and nontender. No distention. There is no CVA tenderness.  No rebound, rigidity, or guarding. Genitourinary:   deferred Musculoskeletal:   Nontender with normal range of motion in all extremities. No joint effusions.  No lower extremity  tenderness.  No edema. Neurologic:   Normal speech and language.  CN 2-10 normal. Motor grossly intact. No pronator drift.  Normal gait. No gross focal neurologic deficits are appreciated.  Skin:    Skin is warm, dry and intact. No rash noted.  No petechiae, purpura, or bullae. Psychiatric:   Mood and affect are normal. Speech and behavior are normal. Patient exhibits appropriate insight and judgment.  ____________________________________________    LABS (pertinent positives/negatives) (all labs ordered are listed, but only abnormal results are displayed) Labs Reviewed  BRAIN NATRIURETIC PEPTIDE - Abnormal; Notable for the following:    B Natriuretic Peptide 1105.0 (*)    All other components within normal limits  BASIC METABOLIC PANEL - Abnormal; Notable for the following:    Glucose, Bld 130 (*)    Creatinine, Ser 2.09 (*)    Calcium 8.0 (*)    GFR calc non Af Amer 22 (*)    GFR calc Af Amer 26 (*)    All other components within normal limits  CBC - Abnormal; Notable for the following:    RBC 3.45 (*)    Hemoglobin 9.4 (*)    HCT 29.1 (*)  RDW 21.5 (*)    Platelets 89 (*)    All other components within normal limits  TROPONIN I - Abnormal; Notable for the following:    Troponin I 0.07 (*)    All other components within normal limits   ____________________________________________   EKG  Interpreted by me Normal sinus rhythm rate of 85, normal axis and intervals. Poor R-wave progression in anterior precordial leads. Normal ST segments. T wave inversions in 1 in aVL which is unchanged from 10/03/2014  ____________________________________________    RADIOLOGY  Chest x-ray unremarkable except for small effusion in the right base  ____________________________________________   PROCEDURES   ____________________________________________   INITIAL IMPRESSION / ASSESSMENT AND PLAN / ED COURSE  Pertinent labs & imaging results that were available during my care  of the patient were reviewed by me and considered in my medical decision making (see chart for details).  Patient presents with shortness of breath and decreased exercise tolerance. I suspect she is having some worsening CHF owing to ischemic cardiomyopathy from her recent and STEMI. We'll check labs. With wheezing we'll give steroids and DuoNeb's although she does not appear to have a history of COPD.  ----------------------------------------- 10:18 PM on 12/07/2014 -----------------------------------------  Labs unremarkable. Patient not feeling much better and still wheezy. Now developing a mild tachycardia. Blood pressure stable. Low suspicion for PE, case discussed with the hospitalist for further evaluation and hospitalization due to ongoing severe shortness of breath in the setting of her severe cardiac disease.     ____________________________________________   FINAL CLINICAL IMPRESSION(S) / ED DIAGNOSES  Final diagnoses:  Acute on chronic diastolic congestive heart failure (HCC)  Chest pain, unspecified chest pain type      Sharman CheekPhillip Havish Petties, MD 12/07/14 2219

## 2014-12-07 NOTE — H&P (Signed)
[facility] [place] [facility]   PATIENT NAME: Savannah Key Sinclair    MR#:  [id number]  DATE OF BIRTH:  06/26/1940   DATE OF ADMISSION:  12/07/2014  PRIMARY CARE PHYSICIAN: Bobbye MortonSHARON A REILLY, MD   REQUESTING/REFERRING PHYSICIAN: Scotty CourtStafford  CHIEF COMPLAINT:   Chief Complaint  Patient presents with  . Shortness of Breath    HISTORY OF PRESENT ILLNESS:  Savannah Key Slavens  is a 74 y.o. female with a known history of systolic congestive heart failure EF 45-50%, end-stage renal disease on hemodialysis Monday Wednesday Friday presenting with shortness of breath and cough. She describes cough nonproductive for approximately 2 week total duration now with associated shortness of breath she was breath both with exertion and now at rest she also attests to having lower extremity edema worsening with unusual as well as orthopnea which she sleeps in a recliner. She did receive dialysis today however states that she was under her dry weight so did not have any volume removed  PAST MEDICAL HISTORY:   Past Medical History  Diagnosis Date  . Chronic kidney disease   . GERD (gastroesophageal reflux disease)   . Carotid artery stenosis   . Coronary artery disease   . Hypothyroidism   . Diabetes mellitus without complication (HCC)   . Depression   . Arthritis   . Kidney failure   . Liver failure (HCC)   . MI (myocardial infarction) (HCC)   . HTN (hypertension)   . Broken leg     R  . CHF (congestive heart failure) (HCC)     PAST SURGICAL HISTORY:   Past Surgical History  Procedure Laterality Date  . Av fistula placement Left   . Cholecystectomy    . Peripheral vascular catheterization N/A 06/11/2014    Procedure: A/V Shuntogram/Fistulagram;  Surgeon: Annice NeedyJason S Dew, MD;  Location: [facility] INVASIVE CV LAB;  Service: Cardiovascular;  Laterality: N/A;  . Peripheral vascular catheterization Left 06/11/2014    Procedure: A/V Shunt Intervention;  Surgeon: Annice NeedyJason S Dew, MD;   Location: [facility] INVASIVE CV LAB;  Service: Cardiovascular;  Laterality: Left;  . Open reduction internal fixation (orif) distal radial fracture Left 08/09/2014    Procedure: OPEN REDUCTION INTERNAL FIXATION (ORIF) DISTAL RADIAL FRACTURE;  Surgeon: Kennedy BuckerMichael Menz, MD;  Location: [facility] ORS;  Service: Orthopedics;  Laterality: Left;  . Peripheral vascular catheterization Left 11/06/2014    Procedure: A/V Shuntogram/Fistulagram;  Surgeon: Renford DillsGregory G Schnier, MD;  Location: [facility] INVASIVE CV LAB;  Service: Cardiovascular;  Laterality: Left;  . Peripheral vascular catheterization Left 11/06/2014    Procedure: A/V Shunt Intervention;  Surgeon: Renford DillsGregory G Schnier, MD;  Location: [facility] INVASIVE CV LAB;  Service: Cardiovascular;  Laterality: Left;    SOCIAL HISTORY:   Social History  Substance Use Topics  . Smoking status: Never Smoker   . Smokeless tobacco: Never Used  . Alcohol Use: No    FAMILY HISTORY:   Family History  Problem Relation Age of Onset  . Hypertension Other     DRUG ALLERGIES:   Allergies  Allergen Reactions  . Sulfa Antibiotics Other (See Comments)    Reaction:  Blood in the urine     REVIEW OF SYSTEMS:  REVIEW OF SYSTEMS:  CONSTITUTIONAL: Denies fevers, chills, fatigue, weakness.  EYES: Denies blurred vision, double vision, or eye pain.  EARS, NOSE, THROAT: Denies tinnitus, ear pain, hearing loss.  RESPIRATORY: Positive cough, shortness of breath, wheezing  CARDIOVASCULAR: Denies chest pain, palpitations, positive edema.  GASTROINTESTINAL: Denies nausea,  vomiting, diarrhea, abdominal pain.  GENITOURINARY: Denies dysuria, hematuria.  ENDOCRINE: Denies nocturia or thyroid problems. HEMATOLOGIC AND LYMPHATIC: Denies easy bruising or bleeding.  SKIN: Denies rash or lesions.  MUSCULOSKELETAL: Denies pain in neck, back, shoulder, knees, hips, or further arthritic symptoms.  NEUROLOGIC: Denies paralysis, paresthesias.  PSYCHIATRIC: Denies anxiety or depressive  symptoms. Otherwise full review of systems performed by me is negative.   MEDICATIONS AT HOME:   Prior to Admission medications   Medication Sig Start Date End Date Taking? Authorizing Provider  aspirin EC 81 MG tablet Take 81 mg by mouth daily.    Yes Historical Provider, MD  atorvastatin (LIPITOR) 80 MG tablet Take 80 mg by mouth at bedtime.   Yes Historical Provider, MD  benzonatate (TESSALON) 100 MG capsule Take 100 mg by mouth 3 (three) times daily as needed for cough.   Yes Historical Provider, MD  calcium acetate (PHOSLO) 667 MG capsule Take 667 mg by mouth 3 (three) times daily with meals.   Yes Historical Provider, MD  cephALEXin (KEFLEX) 250 MG capsule Take 250 mg by mouth at bedtime.    Yes Historical Provider, MD  cetirizine (ZYRTEC) 5 MG tablet Take 5 mg by mouth at bedtime.    Yes Historical Provider, MD  clopidogrel (PLAVIX) 75 MG tablet Take 1 tablet (75 mg total) by mouth daily. 10/04/14  Yes Srikar Sudini, MD  docusate sodium (COLACE) 100 MG capsule Take 100 mg by mouth at bedtime.   Yes Historical Provider, MD  insulin glargine (LANTUS) 100 UNIT/ML injection Inject 90 Units into the skin daily.    Yes Historical Provider, MD  insulin NPH Human (HUMULIN N,NOVOLIN N) 100 UNIT/ML injection Inject 15 Units into the skin 3 (three) times daily before meals.    Yes Historical Provider, MD  isosorbide mononitrate (IMDUR) 60 MG 24 hr tablet Take 60 mg by mouth daily.    Yes Historical Provider, MD  levothyroxine (SYNTHROID, LEVOTHROID) 75 MCG tablet Take 75 mcg by mouth daily.   Yes Historical Provider, MD  loperamide (IMODIUM) 2 MG capsule Take 2 mg by mouth as needed for diarrhea or loose stools.   Yes Historical Provider, MD  nitroGLYCERIN (NITROSTAT) 0.4 MG SL tablet Place 0.4 mg under the tongue every 5 (five) minutes as needed for chest pain.   Yes Historical Provider, MD  pantoprazole (PROTONIX) 40 MG tablet Take 40 mg by mouth 2 (two) times daily.   Yes Historical Provider, MD   pregabalin (LYRICA) 75 MG capsule Take 75 mg by mouth at bedtime.   Yes Historical Provider, MD  ranitidine (ZANTAC) 150 MG tablet Take 150 mg by mouth at bedtime.   Yes Historical Provider, MD  rOPINIRole (REQUIP) 2 MG tablet Take 2 mg by mouth at bedtime.   Yes Historical Provider, MD  sodium chloride (OCEAN) 0.65 % SOLN nasal spray Place 1 spray into both nostrils as needed for congestion.   Yes Historical Provider, MD  traZODone (DESYREL) 50 MG tablet Take 50 mg by mouth at bedtime.   Yes Historical Provider, MD  venlafaxine XR (EFFEXOR-XR) 37.5 MG 24 hr capsule Take 37.5 mg by mouth at bedtime.   Yes Historical Provider, MD  oxyCODONE-acetaminophen (ROXICET) 5-325 MG per tablet Take 1 tablet by mouth every 8 (eight) hours as needed for moderate pain or severe pain (Do not drive or operate heavy machinery while taking as can cause drowsiness.). Patient not taking: Reported on 12/07/2014 07/15/14   Renford Dills, NP      VITAL  SIGNS:  Blood pressure 118/67, pulse 103, temperature 98.8 F (37.1 C), temperature source Oral, resp. rate 23, height 5' (1.524 m), weight 203 lb 4.2 oz (92.2 kg), SpO2 92 %.  PHYSICAL EXAMINATION:  VITAL SIGNS: Filed Vitals:   12/07/14 2200  BP: 118/67  Pulse: 103  Temp:   Resp: 23   GENERAL:74 y.o.female currently in no acute distress.  HEAD: Normocephalic, atraumatic.  EYES: Pupils equal, round, reactive to light. Extraocular muscles intact. No scleral icterus.  MOUTH: Moist mucosal membrane. Dentition intact. No abscess noted.  EAR, NOSE, THROAT: Clear without exudates. No external lesions.  NECK: Supple. No thyromegaly. No nodules. No JVD.  PULMONARY: Diffuse coarse breath sounds with bibasilar crackle. No use of accessory muscles, Good respiratory effort. good air entry bilaterally CHEST: Nontender to palpation.  CARDIOVASCULAR: S1 and S2. Regular rate and rhythm. No murmurs, rubs, or gallops. 1+ edema. Pedal pulses 2+ bilaterally.   GASTROINTESTINAL: Soft, nontender, nondistended. No masses. Positive bowel sounds. No hepatosplenomegaly.  MUSCULOSKELETAL: No swelling, clubbing, or edema. Range of motion full in all extremities.  NEUROLOGIC: Cranial nerves II through XII are intact. No gross focal neurological deficits. Sensation intact. Reflexes intact.  SKIN: No ulceration, lesions, rashes, or cyanosis. Skin warm and dry. Turgor intact.  PSYCHIATRIC: Mood, affect within normal limits. The patient is awake, alert and oriented x 3. Insight, judgment intact.    LABORATORY PANEL:   CBC  Recent Labs Lab 12/07/14 1817  WBC 7.7  HGB 9.4*  HCT 29.1*  PLT 89*   ------------------------------------------------------------------------------------------------------------------  Chemistries   Recent Labs Lab 12/07/14 1817  NA 138  K 3.8  CL 101  CO2 28  GLUCOSE 130*  BUN 15  CREATININE 2.09*  CALCIUM 8.0*   ------------------------------------------------------------------------------------------------------------------  Cardiac Enzymes  Recent Labs Lab 12/07/14 1817  TROPONINI 0.07*   ------------------------------------------------------------------------------------------------------------------  RADIOLOGY:  Dg Chest 2 View  12/07/2014  CLINICAL DATA:  Productive cough, wheezing, and shortness of breath for 2 weeks. Previous myocardial infarcts. EXAM: CHEST  2 VIEW COMPARISON:  10/01/2014 FINDINGS: Moderate cardiomegaly remains stable. Diffuse pulmonary vascular congestion again demonstrated, without evidence of frank edema or focal consolidation. Probable tiny right pleural effusion noted posteriorly on lateral view. IMPRESSION: Stable cardiomegaly and pulmonary vascular congestion. Probable tiny right posterior pleural effusion. Electronically Signed   By: Myles Rosenthal M.D.   On: 12/07/2014 18:44    EKG:   Orders placed or performed during the hospital encounter of 12/07/14  . ED EKG  . ED EKG     IMPRESSION AND PLAN:   74 year old Caucasian female history of end-stage renal disease on hemodialysis as well as congestive heart failure, systolic presenting with shortness of breath 1. Acute on chronic systolic congestive heart failure: Last ejection fraction 45-50%, has received dialysis without volume removal, consult nephrology, consult cardiology, place on telemetry trend cardiac enzymes, ins and outs, daily weights, diuresis IV Lasix has received 40 mg with given additional 40 mg due to dialysis dependent, check echocardiogram appears to have worsening cardiomegaly on x-ray, start ACE inhibitor as blood pressure tolerates 2. End-stage renal disease on hemodialysis: Consult nephrology continuation dialysis 3. Type 2 diabetes insulin requiring: Continue basal insulin at insulin sliding scale 4. Hypothyroidism unspecified: Synthroid 5. Venous thrombi embolism prophylactic: Heparin subcutaneous    All the records are reviewed and case discussed with ED provider. Management plans discussed with the patient, family and they are in agreement.  CODE STATUS: Full  TOTAL TIME TAKING CARE OF THIS PATIENT: 45 minutes.  Hower,  Mardi Mainland.D on 12/07/2014 at 10:32 PM  Between 7am to 6pm - Pager - [phone]  After 6pm: House Pager: - [phone]  [facility]  Office  [phone]  CC: Primary care physician; Bobbye Morton, MD

## 2014-12-07 NOTE — ED Notes (Signed)
[facility]. Patient has hx/o NSTEMI 3 weeks ago. Patient is dialysis patient, M,W,F schedule. Patient had her scheduled dialysis today. Patient states that she has had a cough for 2 weeks. Patient is visibly Short of breath on triage.

## 2014-12-08 ENCOUNTER — Inpatient Hospital Stay
Admit: 2014-12-08 | Discharge: 2014-12-08 | Disposition: A | Discharge status: Home / Self Care | Payer: Medicare (Managed Care) | Attending: Internal Medicine | Admitting: Internal Medicine

## 2014-12-08 LAB — GLUCOSE, CAPILLARY
GLUCOSE-CAPILLARY: 280 mg/dL — AB (ref 65–99)
GLUCOSE-CAPILLARY: 400 mg/dL — AB (ref 65–99)
Glucose-Capillary: 206 mg/dL — ABNORMAL HIGH (ref 65–99)
Glucose-Capillary: 229 mg/dL — ABNORMAL HIGH (ref 65–99)
Glucose-Capillary: 303 mg/dL — ABNORMAL HIGH (ref 65–99)

## 2014-12-08 LAB — BASIC METABOLIC PANEL
Anion gap: 10 (ref 5–15)
BUN: 26 mg/dL — AB (ref 6–20)
CHLORIDE: 99 mmol/L — AB (ref 101–111)
CO2: 29 mmol/L (ref 22–32)
CREATININE: 2.77 mg/dL — AB (ref 0.44–1.00)
Calcium: 8 mg/dL — ABNORMAL LOW (ref 8.9–10.3)
GFR calc non Af Amer: 16 mL/min — ABNORMAL LOW (ref >60)
GFR, EST AFRICAN AMERICAN: 18 mL/min — AB (ref >60)
GLUCOSE: 273 mg/dL — AB (ref 65–99)
Potassium: 4.6 mmol/L (ref 3.5–5.1)
Sodium: 138 mmol/L (ref 135–145)

## 2014-12-08 MED ORDER — AZITHROMYCIN 250 MG PO TABS
250.0000 mg | ORAL_TABLET | Freq: Every day | ORAL | Status: AC
  Administered 2014-12-09 – 2014-12-12 (×4): 250 mg via ORAL
  Filled 2014-12-08 (×4): qty 1

## 2014-12-08 MED ORDER — METHYLPREDNISOLONE SODIUM SUCC 40 MG IJ SOLR
40.0000 mg | Freq: Two times a day (BID) | INTRAMUSCULAR | Status: DC
  Administered 2014-12-08 – 2014-12-09 (×3): 40 mg via INTRAVENOUS
  Filled 2014-12-08 (×4): qty 1

## 2014-12-08 MED ORDER — AZITHROMYCIN 250 MG PO TABS
500.0000 mg | ORAL_TABLET | Freq: Every day | ORAL | Status: AC
  Administered 2014-12-08: 500 mg via ORAL
  Filled 2014-12-08: qty 2

## 2014-12-08 NOTE — Progress Notes (Signed)
Initial Nutrition Assessment    INTERVENTION:   Meals and Snacks: Cater to patient preferences, will place order for all meats to be chopped. Pt may ultimately benefit from downgrading diet to Dysphagia III   NUTRITION DIAGNOSIS:   No nutrition diagnosis at this time  GOAL:   Patient will meet greater than or equal to 90% of their needs  MONITOR:    (Energy Intake, Anthropometrics, Digestive System, Electrolyte/Renal Profile)  REASON FOR ASSESSMENT:   Diagnosis    ASSESSMENT:    Pt admitted with acute respiratory failure with acute CHF, acute bronchitis; pt ESRD on HD  Past Medical History  Diagnosis Date  . Chronic kidney disease   . GERD (gastroesophageal reflux disease)   . Carotid artery stenosis   . Coronary artery disease   . Hypothyroidism   . Diabetes mellitus without complication (HCC)   . Depression   . Arthritis   . Kidney failure   . Liver failure (HCC)   . MI (myocardial infarction) (HCC)   . HTN (hypertension)   . Broken leg     R  . CHF (congestive heart failure) (HCC)      Diet Order:  Diet renal with fluid restriction Fluid restriction:: 1200 mL Fluid; Room service appropriate?: Yes; Fluid consistency:: Thin  Energy Intake: pt reports fair appetite, ate some lunch today, bites of chicken, some peanut butter crackers; pt reports difficulty chewing tough meats, etc  Digestive System: poor dentition   Recent Labs Lab 12/07/14 1817 12/08/14 0428  NA 138 138  K 3.8 4.6  CL 101 99*  CO2 28 29  BUN 15 26*  CREATININE 2.09* 2.77*  CALCIUM 8.0* 8.0*  GLUCOSE 130* 273*   Meds: lasix, ss novolog, phoslo, lasix, lantus  Height:   Ht Readings from Last 1 Encounters:  12/07/14 5' (1.524 m)    Weight: pt reports a few pound wt loss but otherwise stable  Wt Readings from Last 1 Encounters:  12/08/14 192 lb 9.6 oz (87.363 kg)   Filed Weights   12/07/14 1802 12/07/14 2343 12/08/14 0534  Weight: 203 lb 4.2 oz (92.2 kg) 192 lb 9.6 oz  (87.363 kg) 192 lb 9.6 oz (87.363 kg)   Wt Readings from Last 10 Encounters:  12/08/14 192 lb 9.6 oz (87.363 kg)  11/06/14 190 lb (86.183 kg)  10/07/14 185 lb (83.915 kg)  10/05/14 188 lb 1.6 oz (85.322 kg)  08/09/14 190 lb (86.183 kg)  08/07/14 190 lb (86.183 kg)  07/15/14 180 lb (81.647 kg)  06/11/14 180 lb (81.647 kg)    BMI:  Body mass index is 37.61 kg/(m^2).  LOW Care Level  Romelle Starcherate Raziah Funnell MS, [place], LDN [phone] Pager

## 2014-12-08 NOTE — Progress Notes (Signed)
Patient admitted to room 239 with a diagnosis of CHF. A & O x 4. Denied any pain right now. Noted dyspnea on exertion and inspiratory wheezes. Noted 2 + pitting edema on bilateral lower extremities, redness on the L. abdominal fold, abration on the L. Leg and scattered bruises . Skin assessment was done with Yasmin S. RN.  Patient is oriented to her room, call bell/ascom and staff. Accepted her scheduled medications with no complaint. Patient is watching [employer] channel movie stated it's her favorite channel. Will continue to monitor.

## 2014-12-08 NOTE — Consult Note (Signed)
Reason for Consult: Shortness of breath cough possible heart failure Referring Physician: Dr. Clint Guy hospitalist  Savannah Key is an 74 y.o. female.  HPI: Patient presents with symptoms of cough over the last 2 weeks was given antibiotic therapy by primary physician with no improvement she's had persistent shortness of breath no fever. Patient's had known renal insufficiency on dialysis she's had congestive heart failure with mild cardiomyopathy EF around 45%. She's had known coronary disease but no recent anginal symptoms diabetes controlled with insulin she has inhalers at home but with a persistent shortness of breath and cough she finally came in for evaluation. Patient states significant dyspnea or shortness of breath at rest. Mild leg edema.  Past Medical History  Diagnosis Date  . Chronic kidney disease   . GERD (gastroesophageal reflux disease)   . Carotid artery stenosis   . Coronary artery disease   . Hypothyroidism   . Diabetes mellitus without complication (HCC)   . Depression   . Arthritis   . Kidney failure   . Liver failure (HCC)   . MI (myocardial infarction) (HCC)   . HTN (hypertension)   . Broken leg     R  . CHF (congestive heart failure) [facility])     Past Surgical History  Procedure Laterality Date  . Av fistula placement Left   . Cholecystectomy    . Peripheral vascular catheterization N/A 06/11/2014    Procedure: A/V Shuntogram/Fistulagram;  Surgeon: Annice Needy, MD;  Location: [facility] INVASIVE CV LAB;  Service: Cardiovascular;  Laterality: N/A;  . Peripheral vascular catheterization Left 06/11/2014    Procedure: A/V Shunt Intervention;  Surgeon: Annice Needy, MD;  Location: [facility] INVASIVE CV LAB;  Service: Cardiovascular;  Laterality: Left;  . Open reduction internal fixation (orif) distal radial fracture Left 08/09/2014    Procedure: OPEN REDUCTION INTERNAL FIXATION (ORIF) DISTAL RADIAL FRACTURE;  Surgeon: Kennedy Bucker, MD;  Location: [facility] ORS;  Service: Orthopedics;   Laterality: Left;  . Peripheral vascular catheterization Left 11/06/2014    Procedure: A/V Shuntogram/Fistulagram;  Surgeon: Renford Dills, MD;  Location: [facility] INVASIVE CV LAB;  Service: Cardiovascular;  Laterality: Left;  . Peripheral vascular catheterization Left 11/06/2014    Procedure: A/V Shunt Intervention;  Surgeon: Renford Dills, MD;  Location: [facility] INVASIVE CV LAB;  Service: Cardiovascular;  Laterality: Left;    Family History  Problem Relation Age of Onset  . Hypertension Other     Social History:  reports that she has never smoked. She has never used smokeless tobacco. She reports that she does not drink alcohol or use illicit drugs.  Allergies:  Allergies  Allergen Reactions  . Sulfa Antibiotics Other (See Comments)    Reaction:  Blood in the urine     Medications: I have reviewed the patient's current medications.  Results for orders placed or performed during the hospital encounter of 12/07/14 (from the past 48 hour(s))  Brain natriuretic peptide     Status: Abnormal   Collection Time: 12/07/14  6:17 PM  Result Value Ref Range   B Natriuretic Peptide 1105.0 (H) 0.0 - 100.0 pg/mL  Basic metabolic panel     Status: Abnormal   Collection Time: 12/07/14  6:17 PM  Result Value Ref Range   Sodium 138 135 - 145 mmol/L   Potassium 3.8 3.5 - 5.1 mmol/L   Chloride 101 101 - 111 mmol/L   CO2 28 22 - 32 mmol/L   Glucose, Bld 130 (H) 65 - 99 mg/dL  BUN 15 6 - 20 mg/dL   Creatinine, Ser 2.09 (H) 0.44 - 1.00 mg/dL   Calcium 8.0 (L) 8.9 - 10.3 mg/dL   GFR calc non Af Amer 22 (L) >60 mL/min   GFR calc Af Amer 26 (L) >60 mL/min    Comment: (NOTE) The eGFR has been calculated using the CKD EPI equation. This calculation has not been validated in all clinical situations. eGFR's persistently <60 mL/min signify possible Chronic Kidney Disease.    Anion gap 9 5 - 15  CBC     Status: Abnormal   Collection Time: 12/07/14  6:17 PM  Result Value Ref Range   WBC 7.7 3.6  - 11.0 K/uL   RBC 3.45 (L) 3.80 - 5.20 MIL/uL   Hemoglobin 9.4 (L) 12.0 - 16.0 g/dL   HCT 29.1 (L) 35.0 - 47.0 %   MCV 84.2 80.0 - 100.0 fL   MCH 27.3 26.0 - 34.0 pg   MCHC 32.5 32.0 - 36.0 g/dL   RDW 21.5 (H) 11.5 - 14.5 %   Platelets 89 (L) 150 - 440 K/uL  Troponin I     Status: Abnormal   Collection Time: 12/07/14  6:17 PM  Result Value Ref Range   Troponin I 0.07 (H) <0.031 ng/mL    Comment: READ BACK AND VERIFIED WITH ANGELA ROBBINS AT 1856 12/07/14.PMH        PERSISTENTLY INCREASED TROPONIN VALUES IN THE RANGE OF 0.04-0.49 ng/mL CAN BE SEEN IN:       -UNSTABLE ANGINA       -CONGESTIVE HEART FAILURE       -MYOCARDITIS       -CHEST TRAUMA       -ARRYHTHMIAS       -LATE PRESENTING MYOCARDIAL INFARCTION       -COPD   CLINICAL FOLLOW-UP RECOMMENDED.   Glucose, capillary     Status: Abnormal   Collection Time: 12/08/14 12:03 AM  Result Value Ref Range   Glucose-Capillary 229 (H) 65 - 99 mg/dL   Comment 1 Notify RN   Basic metabolic panel     Status: Abnormal   Collection Time: 12/08/14  4:28 AM  Result Value Ref Range   Sodium 138 135 - 145 mmol/L   Potassium 4.6 3.5 - 5.1 mmol/L   Chloride 99 (L) 101 - 111 mmol/L   CO2 29 22 - 32 mmol/L   Glucose, Bld 273 (H) 65 - 99 mg/dL   BUN 26 (H) 6 - 20 mg/dL   Creatinine, Ser 2.77 (H) 0.44 - 1.00 mg/dL   Calcium 8.0 (L) 8.9 - 10.3 mg/dL   GFR calc non Af Amer 16 (L) >60 mL/min   GFR calc Af Amer 18 (L) >60 mL/min    Comment: (NOTE) The eGFR has been calculated using the CKD EPI equation. This calculation has not been validated in all clinical situations. eGFR's persistently <60 mL/min signify possible Chronic Kidney Disease.    Anion gap 10 5 - 15  Glucose, capillary     Status: Abnormal   Collection Time: 12/08/14  7:35 AM  Result Value Ref Range   Glucose-Capillary 280 (H) 65 - 99 mg/dL   Comment 1 Notify RN    Comment 2 Document in Chart     Dg Chest 2 View  12/07/2014  CLINICAL DATA:  Productive cough,  wheezing, and shortness of breath for 2 weeks. Previous myocardial infarcts. EXAM: CHEST  2 VIEW COMPARISON:  10/01/2014 FINDINGS: Moderate cardiomegaly remains stable. Diffuse pulmonary vascular congestion  again demonstrated, without evidence of frank edema or focal consolidation. Probable tiny right pleural effusion noted posteriorly on lateral view. IMPRESSION: Stable cardiomegaly and pulmonary vascular congestion. Probable tiny right posterior pleural effusion. Electronically Signed   By: Earle Gell M.D.   On: 12/07/2014 18:44    Review of Systems  Constitutional: Negative.   HENT: Negative.   Eyes: Negative.   Respiratory: Positive for cough and shortness of breath.   Cardiovascular: Positive for orthopnea and PND.  Gastrointestinal: Negative.   Genitourinary: Negative.   Musculoskeletal: Positive for myalgias and back pain.  Skin: Negative.   Neurological: Negative.   Endo/Heme/Allergies: Negative.   Psychiatric/Behavioral: The patient is nervous/anxious.    Blood pressure 99/49, pulse 91, temperature 98.2 F (36.8 C), temperature source Oral, resp. rate 22, height 5' (1.524 m), weight 87.363 kg (192 lb 9.6 oz), SpO2 95 %. Physical Exam  Nursing note and vitals reviewed. Constitutional: She is oriented to person, place, and time. She appears well-developed and well-nourished.  HENT:  Head: Normocephalic.  Eyes: Conjunctivae and EOM are normal. Pupils are equal, round, and reactive to light.  Neck: Normal range of motion. Neck supple.  Cardiovascular: Normal rate and regular rhythm.   Respiratory: She is in respiratory distress. She has rales.  GI: Soft. Bowel sounds are normal.  Musculoskeletal: Normal range of motion.  Neurological: She is alert and oriented to person, place, and time. She has normal reflexes.  Skin: Skin is warm and dry.  Psychiatric: She has a normal mood and affect.    Assessment/Plan: Shortness of breath Cough Congestion Congestive heart  failure End-stage renal disease GERD Diabetes Coronary artery disease Cardiomyopathy Obesity Hyperlipidemia Thyroid disease . PLAN Dialysis therapy for volume Doubt major heart failure Consider antibiotics for possible URI Maintain Plavix for coronary disease Continue insulin for diabetes management Agree with Imdur for possible angina Continue Protonix for reflux symptoms Consider obstructive sleep apnea Consider pulmonary input Recommend steroid taper Continue inhalers Consider sleep study CPAP weight loss possible obstructive sleep apnea Recommend conservative cardiac involvement at this stage   Savannah Key D. 12/08/2014, 10:37 AM

## 2014-12-08 NOTE — Progress Notes (Signed)
[facility]  ROUNDING NOTE   Subjective:  Pt admitted with cough and shortness of breath which has been progressively getting worse over the past two weeks.  Pt states she went to HD yesterday. 2DEcho performed today, EF currently 40%.   Pulmonary vascular congestion noted on CXR.  Objective:  Vital signs in last 24 hours:  Temp:  [98.2 F (36.8 C)-99.7 F (37.6 C)] 98.2 F (36.8 C) (11/19 0534) Pulse Rate:  [83-159] 91 (11/19 0534) Resp:  [22-32] 22 (11/19 0534) BP: (79-129)/(41-78) 99/49 mmHg (11/19 0750) SpO2:  [92 %-99 %] 95 % (11/19 0750) Weight:  [87.363 kg (192 lb 9.6 oz)-92.2 kg (203 lb 4.2 oz)] 87.363 kg (192 lb 9.6 oz) (11/19 0534)  Weight change:  Filed Weights   12/07/14 1802 12/07/14 2343 12/08/14 0534  Weight: 92.2 kg (203 lb 4.2 oz) 87.363 kg (192 lb 9.6 oz) 87.363 kg (192 lb 9.6 oz)    Intake/Output: I/O last 3 completed shifts: In: 3 [I.V.:3] Out: 125 [Urine:125]   Intake/Output this shift:     Physical Exam: General: NAD, sitting up in bed  Head: Normocephalic, atraumatic. Moist oral mucosal membranes  Eyes: Anicteric  Neck: Supple, trachea midline  Lungs:  Bilateral rhonchi noted, normal respiratory effort  Heart: S1S2 no rubs  Abdomen:  Soft, nontender, BS present   Extremities:  1+ peripheral edema.  Neurologic: Nonfocal, moving all four extremities  Skin: No lesions  Access: LUE AVF    Basic Metabolic Panel:  Recent Labs Lab 12/07/14 1817 12/08/14 0428  NA 138 138  K 3.8 4.6  CL 101 99*  CO2 28 29  GLUCOSE 130* 273*  BUN 15 26*  CREATININE 2.09* 2.77*  CALCIUM 8.0* 8.0*    Liver Function Tests: No results for input(s): AST, ALT, ALKPHOS, BILITOT, PROT, ALBUMIN in the last 168 hours. No results for input(s): LIPASE, AMYLASE in the last 168 hours. No results for input(s): AMMONIA in the last 168 hours.  CBC:  Recent Labs Lab 12/07/14 1817  WBC 7.7  HGB 9.4*  HCT 29.1*  MCV 84.2  PLT 89*    Cardiac  Enzymes:  Recent Labs Lab 12/07/14 1817  TROPONINI 0.07*    BNP: Invalid input(s): POCBNP  CBG:  Recent Labs Lab 12/08/14 0003 12/08/14 0735 12/08/14 1128  GLUCAP 229* 280* 400*    Microbiology: Results for orders placed or performed during the hospital encounter of 10/01/14  Blood culture (routine x 2)     Status: None   Collection Time: 10/01/14  4:40 AM  Result Value Ref Range Status   Specimen Description BLOOD RIGHT THUMB  Final   Special Requests BOTTLES DRAWN AEROBIC AND ANAEROBIC 2ML  Final   Culture NO GROWTH 5 DAYS  Final   Report Status 10/06/2014 FINAL  Final  Blood culture (routine x 2)     Status: None   Collection Time: 10/01/14  4:40 AM  Result Value Ref Range Status   Specimen Description BLOOD RIGHT HAND  Final   Special Requests BOTTLES DRAWN AEROBIC AND ANAEROBIC 2ML  Final   Culture NO GROWTH 5 DAYS  Final   Report Status 10/06/2014 FINAL  Final  MRSA PCR Screening     Status: None   Collection Time: 10/01/14  8:47 AM  Result Value Ref Range Status   MRSA by PCR NEGATIVE NEGATIVE Final    Comment:        The GeneXpert MRSA Assay (FDA approved for NASAL specimens only), is one component  of a comprehensive MRSA colonization surveillance program. It is not intended to diagnose MRSA infection nor to guide or monitor treatment for MRSA infections.     Coagulation Studies: No results for input(s): LABPROT, INR in the last 72 hours.  Urinalysis: No results for input(s): COLORURINE, LABSPEC, PHURINE, GLUCOSEU, HGBUR, BILIRUBINUR, KETONESUR, PROTEINUR, UROBILINOGEN, NITRITE, LEUKOCYTESUR in the last 72 hours.  Invalid input(s): APPERANCEUR    Imaging: Dg Chest 2 View  12/07/2014  CLINICAL DATA:  Productive cough, wheezing, and shortness of breath for 2 weeks. Previous myocardial infarcts. EXAM: CHEST  2 VIEW COMPARISON:  10/01/2014 FINDINGS: Moderate cardiomegaly remains stable. Diffuse pulmonary vascular congestion again demonstrated,  without evidence of frank edema or focal consolidation. Probable tiny right pleural effusion noted posteriorly on lateral view. IMPRESSION: Stable cardiomegaly and pulmonary vascular congestion. Probable tiny right posterior pleural effusion. Electronically Signed   By: Myles Rosenthal M.D.   On: 12/07/2014 18:44     Medications:     . aspirin EC  81 mg Oral Daily  . atorvastatin  80 mg Oral QHS  . azithromycin  500 mg Oral Daily   Followed by  . [START ON 12/09/2014] azithromycin  250 mg Oral Daily  . calcium acetate  667 mg Oral TID WC  . cephALEXin  250 mg Oral QHS  . clopidogrel  75 mg Oral Daily  . docusate sodium  100 mg Oral QHS  . famotidine  10 mg Oral Daily  . furosemide  40 mg Intravenous Once  . heparin  5,000 Units Subcutaneous 3 times per day  . insulin aspart  0-5 Units Subcutaneous QHS  . insulin aspart  0-9 Units Subcutaneous TID WC  . insulin glargine  90 Units Subcutaneous Daily  . isosorbide mononitrate  60 mg Oral Daily  . levothyroxine  75 mcg Oral QAC breakfast  . loratadine  10 mg Oral Daily  . methylPREDNISolone (SOLU-MEDROL) injection  40 mg Intravenous Q12H  . pantoprazole  40 mg Oral BID  . pregabalin  75 mg Oral QHS  . rOPINIRole  2 mg Oral QHS  . sodium chloride  3 mL Intravenous Q12H  . traZODone  50 mg Oral QHS  . venlafaxine XR  37.5 mg Oral QHS   acetaminophen **OR** acetaminophen, benzonatate, ipratropium-albuterol, morphine injection, nitroGLYCERIN, ondansetron **OR** ondansetron (ZOFRAN) IV, oxyCODONE, sodium chloride  Assessment/ Plan:  74 y.o. female with end-stage renal disease, carotid stenosis, coronary artery disease, diabetes type 2, history of myocardial infarction, hypertension, peripheral vascular disease  1.  ESRD on HD MWF:  Patient had dialysis yesterday, pt currently breathing comfortably.  Not requiring bipap.  No urgent indication for HD at present as BP low and pt had HD yesterday.  Will re[place] for need of HD tomorrow.  2.   Anemia of CKD:  hgb currently 9.4.  Resume epogen with next HD treatment.  3.  SHPTH:  Check ipth and phos.  Continue phoslo  po tid/wm.  4.  Shortness of breath:  Pt having cough and has history of underlying heart failure. EF currently 40%. ? If there is a component of bronchitis now as well.  Agree with steroids and abx at this time.  5.  Thanks for consult.   LOS: 1 Ashleyann Shoun 11/19/201611:46 AM

## 2014-12-08 NOTE — Progress Notes (Signed)
[facility] [place] [facility]   PATIENT NAME: Savannah Key    MR#:  [id number]  DATE OF BIRTH:  10/28/1940  SUBJECTIVE:  CHIEF COMPLAINT:   Chief Complaint  Patient presents with  . Shortness of Breath   Patient here due to shortness of breath and a cough and noted to be in acute respiratory failure with hypoxia. Still feels short of breath and has a cough which is nonproductive.  REVIEW OF SYSTEMS:    Review of Systems  Constitutional: Negative for fever and chills.  HENT: Negative for congestion and tinnitus.   Eyes: Negative for blurred vision and double vision.  Respiratory: Positive for cough, shortness of breath and wheezing.   Cardiovascular: Negative for chest pain, orthopnea and PND.  Gastrointestinal: Negative for nausea, vomiting, abdominal pain and diarrhea.  Genitourinary: Negative for dysuria and hematuria.  Neurological: Negative for dizziness, sensory change and focal weakness.  All other systems reviewed and are negative.   Nutrition: Renal Tolerating Diet: Yes Tolerating PT: Await evaluation     DRUG ALLERGIES:   Allergies  Allergen Reactions  . Sulfa Antibiotics Other (See Comments)    Reaction:  Blood in the urine     VITALS:  Blood pressure 99/49, pulse 91, temperature 98.2 F (36.8 C), temperature source Oral, resp. rate 22, height 5' (1.524 m), weight 87.363 kg (192 lb 9.6 oz), SpO2 95 %.  PHYSICAL EXAMINATION:   Physical Exam  GENERAL:  74 y.o.-year-old patient lying in the bed in mild respiratory distress  EYES: Pupils equal, round, reactive to light and accommodation. No scleral icterus. Extraocular muscles intact.  HEENT: Head atraumatic, normocephalic. Oropharynx and nasopharynx clear.  NECK:  Supple, no jugular venous distention. No thyroid enlargement, no tenderness.  LUNGS: Good air entry bilaterally. Diffuse rhonchi, wheezing. No dullness to percussion. Negative use of accessory  muscles. CARDIOVASCULAR: S1, S2 normal. No murmurs, rubs, or gallops.  ABDOMEN: Soft, nontender, nondistended. Bowel sounds present. No organomegaly or mass.  EXTREMITIES: No cyanosis, clubbing or edema b/l.    NEUROLOGIC: Cranial nerves II through XII are intact. No focal Motor or sensory deficits b/l.   PSYCHIATRIC: The patient is alert and oriented x 3. Good affect.  SKIN: No obvious rash, lesion, or ulcer.   Left upper extremity AV fistula with good bruit and thrill.  LABORATORY PANEL:   CBC  Recent Labs Lab 12/07/14 1817  WBC 7.7  HGB 9.4*  HCT 29.1*  PLT 89*   ------------------------------------------------------------------------------------------------------------------  Chemistries   Recent Labs Lab 12/08/14 0428  NA 138  K 4.6  CL 99*  CO2 29  GLUCOSE 273*  BUN 26*  CREATININE 2.77*  CALCIUM 8.0*   ------------------------------------------------------------------------------------------------------------------  Cardiac Enzymes  Recent Labs Lab 12/07/14 1817  TROPONINI 0.07*   ------------------------------------------------------------------------------------------------------------------  RADIOLOGY:  Dg Chest 2 View  12/07/2014  CLINICAL DATA:  Productive cough, wheezing, and shortness of breath for 2 weeks. Previous myocardial infarcts. EXAM: CHEST  2 VIEW COMPARISON:  10/01/2014 FINDINGS: Moderate cardiomegaly remains stable. Diffuse pulmonary vascular congestion again demonstrated, without evidence of frank edema or focal consolidation. Probable tiny right pleural effusion noted posteriorly on lateral view. IMPRESSION: Stable cardiomegaly and pulmonary vascular congestion. Probable tiny right posterior pleural effusion. Electronically Signed   By: Myles RosenthalJohn  Stahl M.D.   On: 12/07/2014 18:44     ASSESSMENT AND PLAN:   74 year old female with past medical history of end-stage renal disease on hemodialysis, GERD, hypothyroidism, type 2 diabetes  without complication, depression, history  of previous MI, hypertension, history of CHF who presented to the hospital due to shortness of breath and cough and noted to be in acute respiratory failure.  #1 acute respiratory failure with hypoxia-I suspect this is likely a combination of volume overload and mild CHF combined with underlying acute bronchitis. -I will start her on IV steroids, Z-Pak, continue duo nebs. -She will also get extra hemodialysis today. Follow clinically and wean off O2 as tolerated.   #2 acute bronchitis-I will start her on IV steroids, Z-Pak. - cont. DuoNeb's when necessary.  #3 CHF-acute on chronic diastolic dysfunction. -Continue hemodialysis for fluid removal. Follow I's and O's and daily weights. -Patient possibly could have underlying sleep apnea is likely needs to be tested as an outpatient.  #4 type 2 diabetes with renal complication-continue Lantus, sliding scale insulin and follow blood sugars.  #5 hypothyroidism-continue Synthroid.  #6 hypertension-continue Imdur.  #7 secondary hyperparathyroidism-continue PhosLo.  #8 history of coronary artery disease-no acute chest pain. -Continue aspirin, Plavix, statin.  #9 restless leg syndrome-continue Requip.  #10 diabetic neuropathy-continue Levaquin.   All the records are reviewed and case discussed with Care Management/Social Workerr. Management plans discussed with the patient, family and they are in agreement.  CODE STATUS: Full  DVT Prophylaxis: Heparin subcutaneous  TOTAL TIME TAKING CARE OF THIS PATIENT: 30 minutes.   POSSIBLE D/C IN 1-2 DAYS, DEPENDING ON CLINICAL CONDITION.   Houston Siren M.D on 12/08/2014 at 10:44 AM  Between 7am to 6pm - Pager - [phone]  After 6pm go to [email or web address] - password [email or web address] [facility]  [place] [facility]  Office  [phone]  CC: Primary care physician; Bobbye Morton, MD

## 2014-12-08 NOTE — Progress Notes (Signed)
Spoke with dr. Cherlynn Kaisersainani regarding blood pressure and scheduled imdur. Per md hold imdur due to low bp.

## 2014-12-08 NOTE — Progress Notes (Signed)
Patient's home medications are with the pharmacy. Please retreive and give them to the patient at discharge.

## 2014-12-09 LAB — RENAL FUNCTION PANEL
ALBUMIN: 3 g/dL — AB (ref 3.5–5.0)
ANION GAP: 9 (ref 5–15)
BUN: 52 mg/dL — ABNORMAL HIGH (ref 6–20)
CALCIUM: 7.7 mg/dL — AB (ref 8.9–10.3)
CO2: 26 mmol/L (ref 22–32)
Chloride: 97 mmol/L — ABNORMAL LOW (ref 101–111)
Creatinine, Ser: 3.6 mg/dL — ABNORMAL HIGH (ref 0.44–1.00)
GFR calc non Af Amer: 12 mL/min — ABNORMAL LOW (ref >60)
GFR, EST AFRICAN AMERICAN: 13 mL/min — AB (ref >60)
Glucose, Bld: 438 mg/dL — ABNORMAL HIGH (ref 65–99)
PHOSPHORUS: 4 mg/dL (ref 2.5–4.6)
Potassium: 4.7 mmol/L (ref 3.5–5.1)
SODIUM: 132 mmol/L — AB (ref 135–145)

## 2014-12-09 LAB — CBC
HCT: 28.6 % — ABNORMAL LOW (ref 35.0–47.0)
HEMOGLOBIN: 9.1 g/dL — AB (ref 12.0–16.0)
MCH: 27.3 pg (ref 26.0–34.0)
MCHC: 31.7 g/dL — ABNORMAL LOW (ref 32.0–36.0)
MCV: 86.1 fL (ref 80.0–100.0)
Platelets: 100 10*3/uL — ABNORMAL LOW (ref 150–440)
RBC: 3.32 MIL/uL — AB (ref 3.80–5.20)
RDW: 22 % — ABNORMAL HIGH (ref 11.5–14.5)
WBC: 7.3 10*3/uL (ref 3.6–11.0)

## 2014-12-09 LAB — GLUCOSE, CAPILLARY
GLUCOSE-CAPILLARY: 231 mg/dL — AB (ref 65–99)
GLUCOSE-CAPILLARY: 200 mg/dL — AB (ref 65–99)
Glucose-Capillary: 381 mg/dL — ABNORMAL HIGH (ref 65–99)

## 2014-12-09 LAB — PHOSPHORUS: Phosphorus: 4 mg/dL (ref 2.5–4.6)

## 2014-12-09 MED ORDER — PENTAFLUOROPROP-TETRAFLUOROETH EX AERO
1.0000 "application " | INHALATION_SPRAY | CUTANEOUS | Status: DC | PRN
  Filled 2014-12-09: qty 30

## 2014-12-09 MED ORDER — LIDOCAINE-PRILOCAINE 2.5-2.5 % EX CREA
1.0000 "application " | TOPICAL_CREAM | CUTANEOUS | Status: DC | PRN
  Filled 2014-12-09: qty 5

## 2014-12-09 MED ORDER — SODIUM CHLORIDE 0.9 % IV SOLN: 100.0000 mL | INTRAVENOUS | Status: DC | PRN

## 2014-12-09 MED ORDER — LIDOCAINE HCL (PF) 1 % IJ SOLN
5.0000 mL | INTRAMUSCULAR | Status: DC | PRN
  Filled 2014-12-09: qty 5

## 2014-12-09 MED ORDER — HEPARIN SODIUM (PORCINE) 1000 UNIT/ML DIALYSIS
1000.0000 [IU] | INTRAMUSCULAR | Status: DC | PRN
  Filled 2014-12-09: qty 1

## 2014-12-09 MED ORDER — BUDESONIDE 0.25 MG/2ML IN SUSP
0.2500 mg | Freq: Two times a day (BID) | RESPIRATORY_TRACT | Status: DC
  Administered 2014-12-09 – 2014-12-13 (×8): 0.25 mg via RESPIRATORY_TRACT
  Filled 2014-12-09 (×8): qty 2

## 2014-12-09 MED ORDER — ALTEPLASE 2 MG IJ SOLR: 2.0000 mg | Freq: Once | INTRAMUSCULAR | Status: DC | PRN

## 2014-12-09 MED ORDER — METHYLPREDNISOLONE SODIUM SUCC 40 MG IJ SOLR
40.0000 mg | Freq: Three times a day (TID) | INTRAMUSCULAR | Status: DC
  Administered 2014-12-09 – 2014-12-18 (×26): 40 mg via INTRAVENOUS
  Filled 2014-12-09 (×26): qty 1

## 2014-12-09 MED ORDER — DEXTROSE 5 % IV SOLN
1.0000 g | INTRAVENOUS | Status: DC
  Administered 2014-12-09 – 2014-12-18 (×10): 1 g via INTRAVENOUS
  Filled 2014-12-09 (×11): qty 10

## 2014-12-09 MED ORDER — IPRATROPIUM-ALBUTEROL 0.5-2.5 (3) MG/3ML IN SOLN
3.0000 mL | RESPIRATORY_TRACT | Status: DC
  Administered 2014-12-09 – 2014-12-13 (×20): 3 mL via RESPIRATORY_TRACT
  Filled 2014-12-09 (×23): qty 3

## 2014-12-09 NOTE — Plan of Care (Signed)
Problem: Safety: Goal: Ability to remain free from injury will improve Outcome: Progressing Fall precautions in place  Problem: Cardiac: Goal: Ability to achieve and maintain adequate cardiopulmonary perfusion will improve Outcome: Progressing Daily weights

## 2014-12-09 NOTE — Progress Notes (Signed)
Zofran 4mg iv given for complaints of nausea, will monitor. 

## 2014-12-09 NOTE — Progress Notes (Signed)
Resting quietly no signs of n/v

## 2014-12-09 NOTE — Progress Notes (Signed)
[facility] [place] at [facility]   PATIENT NAME: Savannah CancelLinda Key    MR#:  [id number]  DATE OF BIRTH:  12/27/1940  SUBJECTIVE:   Patient still continues to have a cough which is nonproductive. Still has some wheezing and feels short of breath. No fever overnight.  REVIEW OF SYSTEMS:    Review of Systems  Constitutional: Negative for fever and chills.  HENT: Negative for congestion and tinnitus.   Eyes: Negative for blurred vision and double vision.  Respiratory: Positive for cough, shortness of breath and wheezing.   Cardiovascular: Negative for chest pain, orthopnea and PND.  Gastrointestinal: Negative for nausea, vomiting, abdominal pain and diarrhea.  Genitourinary: Negative for dysuria and hematuria.  Neurological: Negative for dizziness, sensory change and focal weakness.  All other systems reviewed and are negative.   Nutrition: Renal Tolerating Diet: Yes Tolerating PT: Await evaluation   DRUG ALLERGIES:   Allergies  Allergen Reactions  . Sulfa Antibiotics Other (See Comments)    Reaction:  Blood in the urine     VITALS:  Blood pressure 91/48, pulse 75, temperature 98.3 F (36.8 C), temperature source Oral, resp. rate 20, height 5' (1.524 m), weight 87.862 kg (193 lb 11.2 oz), SpO2 100 %.  PHYSICAL EXAMINATION:   Physical Exam  GENERAL:  74 y.o.-year-old patient lying in the bed in mild respiratory distress  EYES: Pupils equal, round, reactive to light and accommodation. No scleral icterus. Extraocular muscles intact.  HEENT: Head atraumatic, normocephalic. Oropharynx and nasopharynx clear.  NECK:  Supple, no jugular venous distention. No thyroid enlargement, no tenderness.  LUNGS: Good air entry bilaterally. Diffuse rhonchi, wheezing. No dullness to percussion. Negative use of accessory muscles. CARDIOVASCULAR: S1, S2 normal. No murmurs, rubs, or gallops.  ABDOMEN: Soft, nontender, nondistended. Bowel sounds present. No organomegaly or  mass.  EXTREMITIES: No cyanosis, clubbing or edema b/l.    NEUROLOGIC: Cranial nerves II through XII are intact. No focal Motor or sensory deficits b/l.   PSYCHIATRIC: The patient is alert and oriented x 3. Good affect.  SKIN: No obvious rash, lesion, or ulcer.   Left upper extremity AV fistula with good bruit and thrill.  LABORATORY PANEL:   CBC  Recent Labs Lab 12/07/14 1817  WBC 7.7  HGB 9.4*  HCT 29.1*  PLT 89*   ------------------------------------------------------------------------------------------------------------------  Chemistries   Recent Labs Lab 12/08/14 0428  NA 138  K 4.6  CL 99*  CO2 29  GLUCOSE 273*  BUN 26*  CREATININE 2.77*  CALCIUM 8.0*   ------------------------------------------------------------------------------------------------------------------  Cardiac Enzymes  Recent Labs Lab 12/07/14 1817  TROPONINI 0.07*   ------------------------------------------------------------------------------------------------------------------  RADIOLOGY:  Dg Chest 2 View  12/07/2014  CLINICAL DATA:  Productive cough, wheezing, and shortness of breath for 2 weeks. Previous myocardial infarcts. EXAM: CHEST  2 VIEW COMPARISON:  10/01/2014 FINDINGS: Moderate cardiomegaly remains stable. Diffuse pulmonary vascular congestion again demonstrated, without evidence of frank edema or focal consolidation. Probable tiny right pleural effusion noted posteriorly on lateral view. IMPRESSION: Stable cardiomegaly and pulmonary vascular congestion. Probable tiny right posterior pleural effusion. Electronically Signed   By: Myles RosenthalJohn  Stahl M.D.   On: 12/07/2014 18:44     ASSESSMENT AND PLAN:   74 year old female with past medical history of end-stage renal disease on hemodialysis, GERD, hypothyroidism, type 2 diabetes without complication, depression, history of previous MI, hypertension, history of CHF who presented to the hospital due to shortness of breath and cough and  noted to be in acute respiratory failure.  #  1 acute respiratory failure with hypoxia-I suspect this is likely a combination of volume overload and mild CHF combined with underlying acute bronchitis. - slow to improve. Cont. IV steroids, Z-Pak, start on scheduled duonebs, add pulmicort duonebs. -  Pt. To get extra HD treatment today.  Follow clinically and wean off O2 as tolerated.   #2 acute bronchitis-cont. Steroids, duonebs, will add Pulmicort nebs.  - will add Ceftriaxone and cont. Zithromax.   #3 CHF-acute on chronic diastolic dysfunction. -pt. To have extra HD today for fluid removal and will follow.  -Patient possibly could have underlying sleep apnea is likely needs to be tested as an outpatient.  #4 type 2 diabetes with renal complication-continue Lantus, sliding scale insulin and follow blood sugars.  #5 hypothyroidism-continue Synthroid.  #6 hypertension-continue Imdur.  #7 secondary hyperparathyroidism-continue PhosLo.  #8 history of coronary artery disease-no acute chest pain. -Continue aspirin, Plavix, statin.  #9 restless leg syndrome-continue Requip.  #10 diabetic neuropathy-continue Lyrica.   All the records are reviewed and case discussed with Care Management/Social Workerr. Management plans discussed with the patient, family and they are in agreement.  CODE STATUS: Full  DVT Prophylaxis: Heparin subcutaneous  TOTAL TIME TAKING CARE OF THIS PATIENT: 30 minutes.   POSSIBLE D/C IN 1-2 DAYS, DEPENDING ON CLINICAL CONDITION.   Houston Siren M.D on 12/09/2014 at 12:41 PM  Between 7am to 6pm - Pager - [phone]  After 6pm go to [email or web address] - password [email or web address] [facility]  [facility]  Office  [phone]  CC: Primary care physician; Bobbye Morton, MD

## 2014-12-09 NOTE — Progress Notes (Signed)
PT Cancellation Note  Patient Details Name: Savannah Key MRN: [id number] DOB: 11/04/1940   Cancelled Treatment:    Reason Eval/Treat Not Completed: Patient at procedure or test/unavailable  Pt is out of room for dialysis, will try to eval tomorrow.   Malachi ProGalen R Willowdean Luhmann 12/09/2014, 4:00 PM

## 2014-12-09 NOTE — Progress Notes (Signed)
[facility]  ROUNDING NOTE   Subjective:  Pt still short of breath. Has wheezing on exam as well. Discussed case with Dr. Cherlynn KaiserSainani. Will plan for extra HD treatment today.  Objective:  Vital signs in last 24 hours:  Temp:  [97.7 F (36.5 C)-98.6 F (37 C)] 98.3 F (36.8 C) (11/20 1100) Pulse Rate:  [75-99] 75 (11/20 1100) Resp:  [20] 20 (11/20 1100) BP: (91-114)/(48-61) 91/48 mmHg (11/20 1100) SpO2:  [96 %-100 %] 98 % (11/20 1205) Weight:  [87.862 kg (193 lb 11.2 oz)] 87.862 kg (193 lb 11.2 oz) (11/20 0512)  Weight change: -4.338 kg (-9 lb 9 oz) Filed Weights   12/07/14 2343 12/08/14 0534 12/09/14 0512  Weight: 87.363 kg (192 lb 9.6 oz) 87.363 kg (192 lb 9.6 oz) 87.862 kg (193 lb 11.2 oz)    Intake/Output: I/O last 3 completed shifts: In: 123 [P.O.:120; I.V.:3] Out: 425 [Urine:425]   Intake/Output this shift:  Total I/O In: 3 [I.V.:3] Out: 100 [Urine:100]  Physical Exam: General: NAD, sitting up in bed  Head: Normocephalic, atraumatic. Moist oral mucosal membranes  Eyes: Anicteric  Neck: Supple, trachea midline  Lungs:  Bilateral rhonchi noted, basilar rales, wheezing noted.  Heart: S1S2 no rubs  Abdomen:  Soft, nontender, BS present   Extremities:  2+ peripheral edema.  Neurologic: Nonfocal, moving all four extremities  Skin: No lesions  Access: LUE AVF    Basic Metabolic Panel:  Recent Labs Lab 12/07/14 1817 12/08/14 0428  NA 138 138  K 3.8 4.6  CL 101 99*  CO2 28 29  GLUCOSE 130* 273*  BUN 15 26*  CREATININE 2.09* 2.77*  CALCIUM 8.0* 8.0*    Liver Function Tests: No results for input(s): AST, ALT, ALKPHOS, BILITOT, PROT, ALBUMIN in the last 168 hours. No results for input(s): LIPASE, AMYLASE in the last 168 hours. No results for input(s): AMMONIA in the last 168 hours.  CBC:  Recent Labs Lab 12/07/14 1817  WBC 7.7  HGB 9.4*  HCT 29.1*  MCV 84.2  PLT 89*    Cardiac Enzymes:  Recent Labs Lab 12/07/14 1817   TROPONINI 0.07*    BNP: Invalid input(s): POCBNP  CBG:  Recent Labs Lab 12/08/14 1128 12/08/14 1609 12/08/14 2058 12/09/14 0717 12/09/14 1118  GLUCAP 400* 206* 303* 231* 381*    Microbiology: Results for orders placed or performed during the hospital encounter of 10/01/14  Blood culture (routine x 2)     Status: None   Collection Time: 10/01/14  4:40 AM  Result Value Ref Range Status   Specimen Description BLOOD RIGHT THUMB  Final   Special Requests BOTTLES DRAWN AEROBIC AND ANAEROBIC 2ML  Final   Culture NO GROWTH 5 DAYS  Final   Report Status 10/06/2014 FINAL  Final  Blood culture (routine x 2)     Status: None   Collection Time: 10/01/14  4:40 AM  Result Value Ref Range Status   Specimen Description BLOOD RIGHT HAND  Final   Special Requests BOTTLES DRAWN AEROBIC AND ANAEROBIC 2ML  Final   Culture NO GROWTH 5 DAYS  Final   Report Status 10/06/2014 FINAL  Final  MRSA PCR Screening     Status: None   Collection Time: 10/01/14  8:47 AM  Result Value Ref Range Status   MRSA by PCR NEGATIVE NEGATIVE Final    Comment:        The GeneXpert MRSA Assay (FDA approved for NASAL specimens only), is one component of a comprehensive MRSA  colonization surveillance program. It is not intended to diagnose MRSA infection nor to guide or monitor treatment for MRSA infections.     Coagulation Studies: No results for input(s): LABPROT, INR in the last 72 hours.  Urinalysis: No results for input(s): COLORURINE, LABSPEC, PHURINE, GLUCOSEU, HGBUR, BILIRUBINUR, KETONESUR, PROTEINUR, UROBILINOGEN, NITRITE, LEUKOCYTESUR in the last 72 hours.  Invalid input(s): APPERANCEUR    Imaging: Dg Chest 2 View  12/07/2014  CLINICAL DATA:  Productive cough, wheezing, and shortness of breath for 2 weeks. Previous myocardial infarcts. EXAM: CHEST  2 VIEW COMPARISON:  10/01/2014 FINDINGS: Moderate cardiomegaly remains stable. Diffuse pulmonary vascular congestion again demonstrated, without  evidence of frank edema or focal consolidation. Probable tiny right pleural effusion noted posteriorly on lateral view. IMPRESSION: Stable cardiomegaly and pulmonary vascular congestion. Probable tiny right posterior pleural effusion. Electronically Signed   By: Myles Rosenthal M.D.   On: 12/07/2014 18:44     Medications:     . aspirin EC  81 mg Oral Daily  . atorvastatin  80 mg Oral QHS  . azithromycin  250 mg Oral Daily  . budesonide (PULMICORT) nebulizer solution  0.25 mg Nebulization BID  . calcium acetate  667 mg Oral TID WC  . cefTRIAXone (ROCEPHIN)  IV  1 g Intravenous Q24H  . clopidogrel  75 mg Oral Daily  . docusate sodium  100 mg Oral QHS  . famotidine  10 mg Oral Daily  . furosemide  40 mg Intravenous Once  . heparin  5,000 Units Subcutaneous 3 times per day  . insulin aspart  0-5 Units Subcutaneous QHS  . insulin aspart  0-9 Units Subcutaneous TID WC  . insulin glargine  90 Units Subcutaneous Daily  . ipratropium-albuterol  3 mL Nebulization Q4H  . isosorbide mononitrate  60 mg Oral Daily  . levothyroxine  75 mcg Oral QAC breakfast  . loratadine  10 mg Oral Daily  . methylPREDNISolone (SOLU-MEDROL) injection  40 mg Intravenous 3 times per day  . pantoprazole  40 mg Oral BID  . pregabalin  75 mg Oral QHS  . rOPINIRole  2 mg Oral QHS  . sodium chloride  3 mL Intravenous Q12H  . traZODone  50 mg Oral QHS  . venlafaxine XR  37.5 mg Oral QHS   acetaminophen **OR** acetaminophen, benzonatate, morphine injection, nitroGLYCERIN, ondansetron **OR** ondansetron (ZOFRAN) IV, oxyCODONE, sodium chloride  Assessment/ Plan:  74 y.o. female with end-stage renal disease, carotid stenosis, coronary artery disease, diabetes type 2, history of myocardial infarction, hypertension, peripheral vascular disease  1.  ESRD on HD MWF:  Pt had HD on Friday but still short of breath and has some pulmonary vascular congestion on xray.  Will proceed with extra HD treatment today.  Reevaluate for HD  tomorrow as well.  2.  Anemia of CKD:  Start epogen 10000 units IV with HD.  3.  SHPTH:  Check ipth and phos with HD today, continue phoslo.  4.  Shortness of breath/pulmonary edema:  Pt appears to have multifactorial shortness of breath.  Some component of pulmonary edema noted, also has bronchitis.  Will plan for HD today to treat pulmonary edema.  Also on steroids/nebs/O2.   LOS: 2 Keerthi Hazell 11/20/20161:42 PM

## 2014-12-10 LAB — BASIC METABOLIC PANEL
Anion gap: 9 (ref 5–15)
BUN: 26 mg/dL — AB (ref 6–20)
CHLORIDE: 96 mmol/L — AB (ref 101–111)
CO2: 33 mmol/L — AB (ref 22–32)
CREATININE: 2.54 mg/dL — AB (ref 0.44–1.00)
Calcium: 8 mg/dL — ABNORMAL LOW (ref 8.9–10.3)
GFR calc Af Amer: 20 mL/min — ABNORMAL LOW (ref >60)
GFR calc non Af Amer: 18 mL/min — ABNORMAL LOW (ref >60)
GLUCOSE: 285 mg/dL — AB (ref 65–99)
POTASSIUM: 4.3 mmol/L (ref 3.5–5.1)
Sodium: 138 mmol/L (ref 135–145)

## 2014-12-10 LAB — CBC
HEMATOCRIT: 29.2 % — AB (ref 35.0–47.0)
Hemoglobin: 9.3 g/dL — ABNORMAL LOW (ref 12.0–16.0)
MCH: 27.6 pg (ref 26.0–34.0)
MCHC: 31.8 g/dL — AB (ref 32.0–36.0)
MCV: 86.8 fL (ref 80.0–100.0)
Platelets: 93 10*3/uL — ABNORMAL LOW (ref 150–440)
RBC: 3.37 MIL/uL — ABNORMAL LOW (ref 3.80–5.20)
RDW: 22.4 % — AB (ref 11.5–14.5)
WBC: 6.7 10*3/uL (ref 3.6–11.0)

## 2014-12-10 LAB — GLUCOSE, CAPILLARY
GLUCOSE-CAPILLARY: 192 mg/dL — AB (ref 65–99)
GLUCOSE-CAPILLARY: 251 mg/dL — AB (ref 65–99)
Glucose-Capillary: 259 mg/dL — ABNORMAL HIGH (ref 65–99)
Glucose-Capillary: 134 mg/dL — ABNORMAL HIGH (ref 65–99)

## 2014-12-10 LAB — PARATHYROID HORMONE, INTACT (NO CA): PTH: 259 pg/mL — ABNORMAL HIGH (ref 15–65)

## 2014-12-10 MED ORDER — EPOETIN ALFA 10000 UNIT/ML IJ SOLN
10000.0000 [IU] | INTRAMUSCULAR | Status: DC
  Administered 2014-12-10 – 2014-12-19 (×5): 10000 [IU] via INTRAVENOUS

## 2014-12-10 NOTE — Care Management Important Message (Signed)
Important Message  Patient Details  Name: Savannah ShawlLinda M Oldaker MRN: [id number] Date of Birth: 10/20/1940   Medicare Important Message Given:  Yes    Olegario MessierKathy A Dinero Chavira 12/10/2014, 2:38 PM

## 2014-12-10 NOTE — Progress Notes (Signed)
HD tx completed.

## 2014-12-10 NOTE — Progress Notes (Signed)
Inpatient Diabetes Program Recommendations  AACE/ADA: New Consensus Statement on Inpatient Glycemic Control (2015)  Target Ranges:  Prepandial:   less than 140 mg/dL      Peak postprandial:   less than 180 mg/dL (1-2 hours)      Critically ill patients:  140 - 180 mg/dL   Review of Glycemic Control:  Results for [name], Ahtziri M (MRN [id number]) as of 12/10/2014 10:29  Ref. Range 12/08/2014 20:58 12/09/2014 07:17 12/09/2014 11:18 12/09/2014 21:26 12/10/2014 07:27  Glucose-Capillary Latest Ref Range: 65-99 mg/dL 409303 (H) 811231 (H) 914381 (H) 200 (H) 259 (H)    Diabetes history: Diabetes Mellitus Outpatient Diabetes medications: Lantus 90 units daily, NPH 15 units tid with meals (not ordered) Current orders for Inpatient glycemic control:  Novolog sensitive tid with meals and HS, Lantus 90 units daily  Inpatient Diabetes Program Recommendations:   May consider adding Novolog 6 units tid with meals (Hold if patient eats less than 50%).  Thanks, Beryl MeagerJenny Ashauna Bertholf, RN, BC-ADM Inpatient Diabetes Coordinator Pager [phone]

## 2014-12-10 NOTE — Progress Notes (Signed)
Pre-hd tx 

## 2014-12-10 NOTE — Progress Notes (Signed)
Pt returned from Dialysis, dressing to lt upper arm dry and intact.  No disterss on ra.  Denies need at this time.  CB in reach, SR up x 2.

## 2014-12-10 NOTE — Progress Notes (Signed)
Post hd tx 

## 2014-12-10 NOTE — Care Management (Signed)
Patient is followed by [facility].  PT evaluation is pending.  Patient was off the unit for both attempts.  She received HD M W F.  [facility] is aware of admission.  Patient requiring 02 at present, which is acute

## 2014-12-10 NOTE — Progress Notes (Signed)
[facility]  ROUNDING NOTE   Subjective:  Patient seen at the end of dialysis treatment Tolerated well  No acute c/o   Objective:  Vital signs in last 24 hours:  Temp:  [97.4 F (36.3 C)-98.7 F (37.1 C)] 98.7 F (37.1 C) (11/21 1418) Pulse Rate:  [78-141] 88 (11/21 1418) Resp:  [14-28] 20 (11/21 1418) BP: (91-142)/(43-114) 92/53 mmHg (11/21 1418) SpO2:  [92 %-100 %] 100 % (11/21 1418) Weight:  [86.5 kg (190 lb 11.2 oz)-90.3 kg (199 lb 1.2 oz)] 86.5 kg (190 lb 11.2 oz) (11/21 1404)  Weight change: 2.438 kg (5 lb 6 oz) Filed Weights   12/10/14 0435 12/10/14 1000 12/10/14 1404  Weight: 87.408 kg (192 lb 11.2 oz) 88 kg (194 lb 0.1 oz) 86.5 kg (190 lb 11.2 oz)    Intake/Output: I/O last 3 completed shifts: In: 293 [P.O.:240; I.V.:3; IV Piggyback:50] Out: 450 [Urine:450]   Intake/Output this shift:  Total I/O In: 3 [I.V.:3] Out: 1500 [Other:1500]     HEMODIALYSIS FLOWSHEET:  Blood Flow Rate (mL/min): 400 mL/min Arterial Pressure (mmHg): -180 mmHg Venous Pressure (mmHg): 150 mmHg Transmembrane Pressure (mmHg): 50 mmHg Ultrafiltration Rate (mL/min): 570 mL/min Dialysate Flow Rate (mL/min): 800 ml/min Conductivity: Machine : 14 Conductivity: Machine : 14 Dialysis Fluid Bolus: Normal Saline Bolus Amount (mL): 250 mL Intra-Hemodialysis Comments: 2000ml.Marland Kitchen.Marland Kitchen.Tx completed.    Physical Exam: General: NAD, sitting up in bed  Head: Normocephalic, atraumatic. Moist oral mucosal membranes  Eyes: Anicteric  Neck: Supple, trachea midline  Lungs:  Bilateral rhonchi noted, basilar rales, wheezing noted.  Heart: S1S2 no rubs  Abdomen:  Soft, nontender, BS present   Extremities:  2+ peripheral edema.  Neurologic: Nonfocal, moving all four extremities  Skin: No lesions  Access: LUE AVF    Basic Metabolic Panel:  Recent Labs Lab 12/07/14 1817 12/08/14 0428 12/09/14 1552 12/10/14 0403  NA 138 138 132* 138  K 3.8 4.6 4.7 4.3  CL 101 99* 97* 96*  CO2 28  29 26  33*  GLUCOSE 130* 273* 438* 285*  BUN 15 26* 52* 26*  CREATININE 2.09* 2.77* 3.60* 2.54*  CALCIUM 8.0* 8.0* 7.7* 8.0*  PHOS  --   --  4.0  4.0  --     Liver Function Tests:  Recent Labs Lab 12/09/14 1552  ALBUMIN 3.0*   No results for input(s): LIPASE, AMYLASE in the last 168 hours. No results for input(s): AMMONIA in the last 168 hours.  CBC:  Recent Labs Lab 12/07/14 1817 12/09/14 1621 12/10/14 0403  WBC 7.7 7.3 6.7  HGB 9.4* 9.1* 9.3*  HCT 29.1* 28.6* 29.2*  MCV 84.2 86.1 86.8  PLT 89* 100* 93*    Cardiac Enzymes:  Recent Labs Lab 12/07/14 1817  TROPONINI 0.07*    BNP: Invalid input(s): POCBNP  CBG:  Recent Labs Lab 12/09/14 0717 12/09/14 1118 12/09/14 2126 12/10/14 0727 12/10/14 1418  GLUCAP 231* 381* 200* 259* 134*    Microbiology: Results for orders placed or performed during the hospital encounter of 10/01/14  Blood culture (routine x 2)     Status: None   Collection Time: 10/01/14  4:40 AM  Result Value Ref Range Status   Specimen Description BLOOD RIGHT THUMB  Final   Special Requests BOTTLES DRAWN AEROBIC AND ANAEROBIC 2ML  Final   Culture NO GROWTH 5 DAYS  Final   Report Status 10/06/2014 FINAL  Final  Blood culture (routine x 2)     Status: None   Collection Time: 10/01/14  4:40 AM  Result Value Ref Range Status   Specimen Description BLOOD RIGHT HAND  Final   Special Requests BOTTLES DRAWN AEROBIC AND ANAEROBIC  Final   Culture NO GROWTH 5 DAYS  Final   Report Status 10/06/2014 FINAL  Final  MRSA PCR Screening     Status: None   Collection Time: 10/01/14  8:47 AM  Result Value Ref Range Status   MRSA by PCR NEGATIVE NEGATIVE Final    Comment:        The GeneXpert MRSA Assay (FDA approved for NASAL specimens only), is one component of a comprehensive MRSA colonization surveillance program. It is not intended to diagnose MRSA infection nor to guide or monitor treatment for MRSA infections.     Coagulation  Studies: No results for input(s): LABPROT, INR in the last 72 hours.  Urinalysis: No results for input(s): COLORURINE, LABSPEC, PHURINE, GLUCOSEU, HGBUR, BILIRUBINUR, KETONESUR, PROTEINUR, UROBILINOGEN, NITRITE, LEUKOCYTESUR in the last 72 hours.  Invalid input(s): APPERANCEUR    Imaging: No results found.   Medications:     . aspirin EC  81 mg Oral Daily  . atorvastatin  80 mg Oral QHS  . azithromycin  250 mg Oral Daily  . budesonide (PULMICORT) nebulizer solution  0.25 mg Nebulization BID  . calcium acetate  667 mg Oral TID WC  . cefTRIAXone (ROCEPHIN)  IV  1 g Intravenous Q24H  . clopidogrel  75 mg Oral Daily  . docusate sodium  100 mg Oral QHS  . epoetin (EPOGEN/PROCRIT) injection  10,000 Units Intravenous Q M,W,F-HD  . famotidine  10 mg Oral Daily  . furosemide  40 mg Intravenous Once  . heparin  5,000 Units Subcutaneous 3 times per day  . insulin aspart  0-5 Units Subcutaneous QHS  . insulin aspart  0-9 Units Subcutaneous TID WC  . insulin glargine  90 Units Subcutaneous Daily  . ipratropium-albuterol  3 mL Nebulization Q4H  . isosorbide mononitrate  60 mg Oral Daily  . levothyroxine  75 mcg Oral QAC breakfast  . loratadine  10 mg Oral Daily  . methylPREDNISolone (SOLU-MEDROL) injection  40 mg Intravenous 3 times per day  . pantoprazole  40 mg Oral BID  . pregabalin  75 mg Oral QHS  . rOPINIRole  2 mg Oral QHS  . sodium chloride  3 mL Intravenous Q12H  . traZODone  50 mg Oral QHS  . venlafaxine XR  37.5 mg Oral QHS   sodium chloride, sodium chloride, acetaminophen **OR** acetaminophen, alteplase, benzonatate, heparin, lidocaine (PF), lidocaine-prilocaine, morphine injection, nitroGLYCERIN, ondansetron **OR** ondansetron (ZOFRAN) IV, oxyCODONE, pentafluoroprop-tetrafluoroeth, sodium chloride  Assessment/ Plan:  74 y.o. female with end-stage renal disease, carotid stenosis, coronary artery disease, diabetes type 2, history of myocardial infarction, hypertension,  peripheral vascular disease  1.  ESRD on HD MWF:   - hemodialysis done today 1500 cc of fluid was removed  2.  Anemia of CKD:  Start epogen 10000 units IV with HD.  3.  SHPTH:  Check ipth and phos with HD today, continue phoslo.  4.  Shortness of breath/pulmonary edema:   -  Steroids/nebs/O2. - UF with HD as tolerated   LOS: 3 Keely Drennan 11/21/20163:07 PM

## 2014-12-10 NOTE — Progress Notes (Signed)
HD tx start 

## 2014-12-10 NOTE — Progress Notes (Signed)
Pt refusing bed alarm, education provided.  Pt says she will call when she needs assistance, she states she is not getting up alone.  Will continue to monitor. Louis MeckelMcRae, Liannah Yarbough H

## 2014-12-10 NOTE — Progress Notes (Signed)
[facility] - [place] at [facility] - [facility]   PATIENT NAME: Tiaria Biby    MR#:  [id number]  DATE OF BIRTH:  1940-03-15  SUBJECTIVE:   Patient seen on hemodialysis earlier. Still continues to have a cough which is nonproductive. Still has shortness of breath, wheezing.  REVIEW OF SYSTEMS:    Review of Systems  Constitutional: Negative for fever and chills.  HENT: Negative for congestion and tinnitus.   Eyes: Negative for blurred vision and double vision.  Respiratory: Positive for cough, shortness of breath and wheezing.   Cardiovascular: Negative for chest pain, orthopnea and PND.  Gastrointestinal: Negative for nausea, vomiting, abdominal pain and diarrhea.  Genitourinary: Negative for dysuria and hematuria.  Neurological: Negative for dizziness, sensory change and focal weakness.  All other systems reviewed and are negative.   Nutrition: Renal Tolerating Diet: Yes Tolerating PT: Eval noted.    DRUG ALLERGIES:   Allergies  Allergen Reactions  . Sulfa Antibiotics Other (See Comments)    Reaction:  Blood in the urine     VITALS:  Blood pressure 109/49, pulse 88, temperature 97.4 F (36.3 C), temperature source Oral, resp. rate 20, height 5' (1.524 m), weight 86.5 kg (190 lb 11.2 oz), SpO2 100 %.  PHYSICAL EXAMINATION:   Physical Exam  GENERAL:  74 y.o.-year-old patient lying in the bed in mild respiratory distress  EYES: Pupils equal, round, reactive to light and accommodation. No scleral icterus. Extraocular muscles intact.  HEENT: Head atraumatic, normocephalic. Oropharynx and nasopharynx clear.  NECK:  Supple, no jugular venous distention. No thyroid enlargement, no tenderness.  LUNGS: Good air entry bilaterally. Diffuse rhonchi, wheezing b/l. No dullness to percussion. Negative use of accessory muscles. CARDIOVASCULAR: S1, S2 normal. No murmurs, rubs, or gallops.  ABDOMEN: Soft, nontender, nondistended. Bowel sounds present. No organomegaly or  mass.  EXTREMITIES: No cyanosis, clubbing or edema b/l.    NEUROLOGIC: Cranial nerves II through XII are intact. No focal Motor or sensory deficits b/l.   PSYCHIATRIC: The patient is alert and oriented x 3. Good affect.  SKIN: No obvious rash, lesion, or ulcer.   Left upper extremity AV fistula with good bruit and thrill.  LABORATORY PANEL:   CBC  Recent Labs Lab 12/10/14 0403  WBC 6.7  HGB 9.3*  HCT 29.2*  PLT 93*   ------------------------------------------------------------------------------------------------------------------  Chemistries   Recent Labs Lab 12/10/14 0403  NA 138  K 4.3  CL 96*  CO2 33*  GLUCOSE 285*  BUN 26*  CREATININE 2.54*  CALCIUM 8.0*   ------------------------------------------------------------------------------------------------------------------  Cardiac Enzymes  Recent Labs Lab 12/07/14 1817  TROPONINI 0.07*   ------------------------------------------------------------------------------------------------------------------  RADIOLOGY:  No results found.   ASSESSMENT AND PLAN:   74 year old female with past medical history of end-stage renal disease on hemodialysis, GERD, hypothyroidism, type 2 diabetes without complication, depression, history of previous MI, hypertension, history of CHF who presented to the hospital due to shortness of breath and cough and noted to be in acute respiratory failure.  #1 acute respiratory failure with hypoxia-due to CHF/acute bronchitis.  - slow to improve. Cont. IV steroids, Z-Pak, duonebs, pulmicort duonebs. -  Cont. HD and wean off O2 as tolerated.    #2 acute bronchitis-cont. Steroids, duonebs, Pulmicort nebs.  - cont. Ceftriaxone and cont. Zithromax.  - will repeat CXR in a.m.   #3 CHF-acute on chronic diastolic dysfunction. - had an extra HD treatment yesterday for fluid removal and getting HD again today.  -Patient possibly could have underlying sleep apnea is  likely needs to be  tested as an outpatient.  #4 type 2 diabetes with renal complication-continue Lantus, sliding scale insulin and follow blood sugars.  #5 hypothyroidism-continue Synthroid.  #6 hypertension-continue Imdur.  #7 secondary hyperparathyroidism-continue PhosLo.  #8 history of coronary artery disease-no acute chest pain. -Continue aspirin, Plavix, statin.  #9 restless leg syndrome-continue Requip.  #10 diabetic neuropathy-continue Lyrica.   All the records are reviewed and case discussed with Care Management/Social Workerr. Management plans discussed with the patient, family and they are in agreement.  CODE STATUS: Full  DVT Prophylaxis: Heparin subcutaneous  TOTAL TIME TAKING CARE OF THIS PATIENT: 25 minutes.   POSSIBLE D/C IN 1-2 DAYS, DEPENDING ON CLINICAL CONDITION.   Houston SirenSAINANI,Ayansh Feutz J M.D on 12/10/2014 at 2:15 PM  Between 7am to 6pm - Pager - [phone]  After 6pm go to [email or web address] - password [email or web address] [facility]  [facility]  Office  [phone]  CC: Primary care physician; Bobbye MortonSHARON A REILLY, MD

## 2014-12-10 NOTE — Progress Notes (Signed)
Zofran 4mg iv given for complaints of nausea 

## 2014-12-10 NOTE — Evaluation (Signed)
Physical Therapy Evaluation Patient Details Name: Savannah ShawlLinda M Key MRN: [id number] DOB: 11/07/1940 Today's Date: 12/10/2014   History of Present Illness  presented to ER secondary to cough, SOB; admitted with acute/chronic respiratory failure secondary to CHF and bronchitis.    Clinical Impression  Upon evaluation, patient alert and oriented to basic information; follows commands and demonstrates fair/good insight into deficits.  Bilat Ue/LE strength grossly WFL and symmetrical for basic transfers and mobility.  Able to complete bed mobility indep; sit/stand, basic transfers and gait (125') with RW, cga for safety.  Slow, but fairly steady, without buckling or LOB.  BORG 8-9/10, sats 96% on RA with exertion; HR does elevate to 140s, but quickly returns to baseline with seated rest.  RN informed/aware. Would benefit from skilled PT to address above deficits and promote optimal return to PLOF; Recommend transition to HHPT upon discharge from acute hospitalization.     Follow Up Recommendations Home health PT    Equipment Recommendations       Recommendations for Other Services       Precautions / Restrictions Precautions Precautions: Fall Precaution Comments: No BP L UE Restrictions Weight Bearing Restrictions: No      Mobility  Bed Mobility Overal bed mobility: Modified Independent                Transfers Overall transfer level: Needs assistance Equipment used: Rolling walker (2 wheeled) Transfers: Sit to/from Stand Sit to Stand: Min guard            Ambulation/Gait Ambulation/Gait assistance: Min guard Ambulation Distance (Feet): 125 Feet Assistive device: Rolling walker (2 wheeled)   Gait velocity: 10' walk time, 12-13 seconds   General Gait Details: reciprocal stepping with slow, steady cadence and gait speed; no buckling or LOB.  BORG 8-9/10, sats 96% on RA with exertion; HR does elevate to 140s, but quickly returns to baseline with seated rest.  RN  informed/aware.  Stairs            Wheelchair Mobility    Modified Rankin (Stroke Patients Only)       Balance Overall balance assessment: Needs assistance Sitting-balance support: No upper extremity supported;Feet supported Sitting balance-Leahy Scale: Good     Standing balance support: Bilateral upper extremity supported Standing balance-Leahy Scale: Fair                               Pertinent Vitals/Pain Pain Assessment: No/denies pain    Home Living Family/patient expects to be discharged to:: Private residence Living Arrangements: Spouse/significant other Available Help at Discharge: Family Type of Home: House Home Access: Ramped entrance     Home Layout: One level Home Equipment: Environmental consultantWalker - 4 wheels;Tub bench;Wheelchair - manual      Prior Function Level of Independence: Independent with assistive device(s)         Comments: Indep for household and limited community distances with [phone]; denies fall history.  Active with [facility] program and attends groups at facility 2 days/week     Hand Dominance        Extremity/Trunk Assessment   Upper Extremity Assessment: Overall WFL for tasks assessed           Lower Extremity Assessment: Overall WFL for tasks assessed (grossly at least 4/5 throughout)         Communication   Communication: No difficulties  Cognition Arousal/Alertness: Awake/alert Behavior During Therapy: WFL for tasks assessed/performed Overall Cognitive Status: Within Functional Limits  for tasks assessed                      General Comments      Exercises        Assessment/Plan    PT Assessment Patient needs continued PT services  PT Diagnosis Generalized weakness   PT Problem List Decreased strength;Decreased activity tolerance;Decreased balance;Decreased mobility;Decreased knowledge of use of DME;Decreased safety awareness;Decreased knowledge of precautions;Cardiopulmonary status limiting activity   PT Treatment Interventions Gait training;Functional mobility training;Therapeutic activities;DME instruction;Therapeutic exercise;Balance training;Patient/family education   PT Goals (Current goals can be found in the Care Plan section) Acute Rehab PT Goals Patient Stated Goal: "to return home" PT Goal Formulation: With patient Time For Goal Achievement: 12/24/14 Potential to Achieve Goals: Good    Frequency Min 2X/week   Barriers to discharge        Co-evaluation               End of Session Equipment Utilized During Treatment: Gait belt Activity Tolerance: Patient tolerated treatment well Patient left: in chair;with call bell/phone within reach (chair pad under patient; box not available.  RN informed/aware of patient position) Nurse Communication: Mobility status (vitals response to activity)         Time: 1610-9604 PT Time Calculation (min) (ACUTE ONLY): 17 min   Charges:   PT Evaluation $Initial PT Evaluation Tier I: 1 Procedure PT Treatments $Gait Training: 8-22 mins   PT G Codes:        Gerrad Welker H. Manson Passey, PT, DPT, NCS 12/10/2014, 10:27 AM [phone]

## 2014-12-10 NOTE — Progress Notes (Signed)
Initial appointment made at the [facility] on December 27, 2014 at 11:00am. Thank you

## 2014-12-10 NOTE — Plan of Care (Signed)
Problem: Safety: Goal: Ability to remain free from injury will improve Outcome: Progressing Fall precautions in place  Problem: Pain Managment: Goal: General experience of comfort will improve Outcome: Progressing PRN medications  Problem: Physical Regulation: Goal: Will remain free from infection Outcome: Progressing Remains on IV antibiotics

## 2014-12-10 NOTE — Discharge Instructions (Signed)
[facility] appointment on December 27, 2014 at 11:00am with Clarisa Kindredina Kamryn Messineo, FNP. Please call [phone] to reschedule.

## 2014-12-11 ENCOUNTER — Inpatient Hospital Stay: Payer: Medicare (Managed Care)

## 2014-12-11 LAB — CBC
HEMATOCRIT: 30.9 % — AB (ref 35.0–47.0)
Hemoglobin: 9.6 g/dL — ABNORMAL LOW (ref 12.0–16.0)
MCH: 26.9 pg (ref 26.0–34.0)
MCHC: 31.1 g/dL — AB (ref 32.0–36.0)
MCV: 86.4 fL (ref 80.0–100.0)
Platelets: 104 10*3/uL — ABNORMAL LOW (ref 150–440)
RBC: 3.57 MIL/uL — ABNORMAL LOW (ref 3.80–5.20)
RDW: 22.8 % — AB (ref 11.5–14.5)
WBC: 6.2 10*3/uL (ref 3.6–11.0)

## 2014-12-11 LAB — GLUCOSE, CAPILLARY
GLUCOSE-CAPILLARY: 275 mg/dL — AB (ref 65–99)
GLUCOSE-CAPILLARY: 121 mg/dL — AB (ref 65–99)
GLUCOSE-CAPILLARY: 288 mg/dL — AB (ref 65–99)

## 2014-12-11 LAB — HEPATITIS B SURFACE ANTIGEN: HEP B S AG: NEGATIVE

## 2014-12-11 MED ORDER — METOPROLOL TARTRATE 25 MG PO TABS
12.5000 mg | ORAL_TABLET | Freq: Two times a day (BID) | ORAL | Status: DC
  Administered 2014-12-11 – 2014-12-18 (×12): 12.5 mg via ORAL
  Filled 2014-12-11 (×13): qty 1

## 2014-12-11 NOTE — Progress Notes (Signed)
[facility]  ROUNDING NOTE   Subjective:  Patient was dialyzed yesterday. Tolerated well. 1500 cc of fluid was removed Continues to have wheezing Off of oxygen today   Objective:  Vital signs in last 24 hours:  Temp:  [97.4 F (36.3 C)-99.1 F (37.3 C)] 98 F (36.7 C) (11/22 0428) Pulse Rate:  [86-113] 113 (11/22 0814) Resp:  [17-22] 18 (11/22 0814) BP: (92-112)/(43-60) 112/54 mmHg (11/22 0814) SpO2:  [91 %-100 %] 92 % (11/22 0811) FiO2 (%):  [21 %] 21 % (11/22 0811) Weight:  [86.32 kg (190 lb 4.8 oz)-86.5 kg (190 lb 11.2 oz)] 86.32 kg (190 lb 4.8 oz) (11/22 0428)  Weight change: -2.3 kg (-5 lb 1.1 oz) Filed Weights   12/10/14 1000 12/10/14 1404 12/11/14 0428  Weight: 88 kg (194 lb 0.1 oz) 86.5 kg (190 lb 11.2 oz) 86.32 kg (190 lb 4.8 oz)    Intake/Output: I/O last 3 completed shifts: In: 68 [I.V.:3; IV Piggyback:50] Out: 1650 [Urine:150; Other:1500]   Intake/Output this shift:  Total I/O In: 240 [P.O.:240] Out: 0      HEMODIALYSIS FLOWSHEET:  Blood Flow Rate (mL/min): 400 mL/min Arterial Pressure (mmHg): -180 mmHg Venous Pressure (mmHg): 150 mmHg Transmembrane Pressure (mmHg): 50 mmHg Ultrafiltration Rate (mL/min): 570 mL/min Dialysate Flow Rate (mL/min): 800 ml/min Conductivity: Machine : 14 Conductivity: Machine : 14 Dialysis Fluid Bolus: Normal Saline Bolus Amount (mL): 250 mL Intra-Hemodialysis Comments: .Marland KitchenMarland KitchenTx completed.    Physical Exam: General: NAD, sitting up in bed  Head: Normocephalic, atraumatic. Moist oral mucosal membranes  Eyes: Anicteric  Neck: Supple, trachea midline  Lungs:  Bilateral rhonchi noted, basilar rales, wheezing noted.  Heart: S1S2 no rubs  Abdomen:  Soft, nontender, BS present   Extremities:  1+ peripheral edema.  Neurologic: Nonfocal, moving all four extremities  Skin: No lesions  Access: LUE AVF    Basic Metabolic Panel:  Recent Labs Lab 12/07/14 1817 12/08/14 0428 12/09/14 1552  12/10/14 0403  NA 138 138 132* 138  K 3.8 4.6 4.7 4.3  CL 101 99* 97* 96*  CO2 33*  GLUCOSE 130* 273* 438* 285*  BUN 15 26* 52* 26*  CREATININE 2.09* 2.77* 3.60* 2.54*  CALCIUM 8.0* 8.0* 7.7* 8.0*  PHOS  --   --  4.0  4.0  --     Liver Function Tests:  Recent Labs Lab 12/09/14 1552  ALBUMIN 3.0*   No results for input(s): LIPASE, AMYLASE in the last 168 hours. No results for input(s): AMMONIA in the last 168 hours.  CBC:  Recent Labs Lab 12/07/14 1817 12/09/14 1621 12/10/14 0403 12/11/14 0346  WBC 7.7 7.3 6.7 6.2  HGB 9.4* 9.1* 9.3* 9.6*  HCT 29.1* 28.6* 29.2* 30.9*  MCV 84.2 86.1 86.8 86.4  PLT 89* 100* 93* 104*    Cardiac Enzymes:  Recent Labs Lab 12/07/14 1817  TROPONINI 0.07*    BNP: Invalid input(s): POCBNP  CBG:  Recent Labs Lab 12/10/14 0727 12/10/14 1418 12/10/14 1636 12/10/14 2142 12/11/14 0734  GLUCAP 259* 134* 192* 251* 275*    Microbiology: Results for orders placed or performed during the hospital encounter of 10/01/14  Blood culture (routine x 2)     Status: None   Collection Time: 10/01/14  4:40 AM  Result Value Ref Range Status   Specimen Description BLOOD RIGHT THUMB  Final   Special Requests BOTTLES DRAWN AEROBIC AND ANAEROBIC  Final   Culture NO GROWTH 5 DAYS  Final   Report Status  10/06/2014 FINAL  Final  Blood culture (routine x 2)     Status: None   Collection Time: 10/01/14  4:40 AM  Result Value Ref Range Status   Specimen Description BLOOD RIGHT HAND  Final   Special Requests BOTTLES DRAWN AEROBIC AND ANAEROBIC  Final   Culture NO GROWTH 5 DAYS  Final   Report Status 10/06/2014 FINAL  Final  MRSA PCR Screening     Status: None   Collection Time: 10/01/14  8:47 AM  Result Value Ref Range Status   MRSA by PCR NEGATIVE NEGATIVE Final    Comment:        The GeneXpert MRSA Assay (FDA approved for NASAL specimens only), is one component of a comprehensive MRSA colonization surveillance program.  It is not intended to diagnose MRSA infection nor to guide or monitor treatment for MRSA infections.     Coagulation Studies: No results for input(s): LABPROT, INR in the last 72 hours.  Urinalysis: No results for input(s): COLORURINE, LABSPEC, PHURINE, GLUCOSEU, HGBUR, BILIRUBINUR, KETONESUR, PROTEINUR, UROBILINOGEN, NITRITE, LEUKOCYTESUR in the last 72 hours.  Invalid input(s): APPERANCEUR    Imaging: Dg Chest 1 View  12/11/2014  CLINICAL DATA:  Shortness of breath. EXAM: CHEST 1 VIEW COMPARISON:  12/07/2014 . FINDINGS: Mediastinum and hilar structures normal. Cardiomegaly with mild pulmonary vascular prominence. No focal infiltrate. No pleural effusion or pneumothorax. Old left third rib fracture. IMPRESSION: Cardiomegaly with pulmonary venous congestion. No evidence of overt pulmonary edema or focal infiltrate . Electronically Signed   By: Maisie Fus  Register   On: 12/11/2014 07:30     Medications:     . aspirin EC  81 mg Oral Daily  . atorvastatin  80 mg Oral QHS  . azithromycin  250 mg Oral Daily  . budesonide (PULMICORT) nebulizer solution  0.25 mg Nebulization BID  . calcium acetate  667 mg Oral TID WC  . cefTRIAXone (ROCEPHIN)  IV  1 g Intravenous Q24H  . clopidogrel  75 mg Oral Daily  . docusate sodium  100 mg Oral QHS  . epoetin (EPOGEN/PROCRIT) injection  10,000 Units Intravenous Q M,W,F-HD  . famotidine  10 mg Oral Daily  . furosemide  40 mg Intravenous Once  . heparin  5,000 Units Subcutaneous 3 times per day  . insulin aspart  0-5 Units Subcutaneous QHS  . insulin aspart  0-9 Units Subcutaneous TID WC  . insulin glargine  90 Units Subcutaneous Daily  . ipratropium-albuterol  3 mL Nebulization Q4H  . isosorbide mononitrate  60 mg Oral Daily  . levothyroxine  75 mcg Oral QAC breakfast  . loratadine  10 mg Oral Daily  . methylPREDNISolone (SOLU-MEDROL) injection  40 mg Intravenous 3 times per day  . pantoprazole  40 mg Oral BID  . pregabalin  75 mg Oral QHS   . rOPINIRole  2 mg Oral QHS  . sodium chloride  3 mL Intravenous Q12H  . traZODone  50 mg Oral QHS  . venlafaxine XR  37.5 mg Oral QHS   sodium chloride, sodium chloride, acetaminophen **OR** acetaminophen, alteplase, benzonatate, heparin, lidocaine (PF), lidocaine-prilocaine, morphine injection, nitroGLYCERIN, ondansetron **OR** ondansetron (ZOFRAN) IV, oxyCODONE, pentafluoroprop-tetrafluoroeth, sodium chloride  Assessment/ Plan:  74 y.o. female with end-stage renal disease, carotid stenosis, coronary artery disease, diabetes type 2, history of myocardial infarction, hypertension, peripheral vascular disease  1.  ESRD on HD MWF:   - hemodialysis done yesterday 1500 cc of fluid was removed  2.  Anemia of CKD:  Start epogen 10000  units IV with HD.  3.  SHPTH:  - Monitor phosphorus, continue phoslo.  4.  Shortness of breath/pulmonary edema:   -  Steroids/nebs/O2. - UF with HD as tolerated. Extra treatment today - CT of the chest to be done today without contrast   LOS: 4 Matthe Sloane 11/22/201611:05 AM

## 2014-12-11 NOTE — Progress Notes (Signed)
Dr. Allena KatzPatel notified about pts rhythm change from SR to Afib with no history.  MD to place order, will continue to monitor. Savannah MeckelMcRae, Ryn Peine H

## 2014-12-11 NOTE — Progress Notes (Signed)
Pre-hd tx 

## 2014-12-11 NOTE — Progress Notes (Signed)
MD re-paged for order clarification.  MD gave oral order for metoprolol 12.5 BID with systolic parameters. Savannah Key, Silviano Neuser H

## 2014-12-11 NOTE — Progress Notes (Signed)
Post hd tx 

## 2014-12-11 NOTE — Progress Notes (Signed)
HD tx start 

## 2014-12-11 NOTE — Progress Notes (Signed)
[facility] - [place] at [facility]   PATIENT NAME: Savannah Key    MR#:  [id number]  DATE OF BIRTH:  1940/04/23  SUBJECTIVE:   Still having significant wheezing and cough but improved since admission.  No fever.    REVIEW OF SYSTEMS:    Review of Systems  Constitutional: Negative for fever and chills.  HENT: Negative for congestion and tinnitus.   Eyes: Negative for blurred vision and double vision.  Respiratory: Positive for cough, shortness of breath and wheezing.   Cardiovascular: Negative for chest pain, orthopnea and PND.  Gastrointestinal: Negative for nausea, vomiting, abdominal pain and diarrhea.  Genitourinary: Negative for dysuria and hematuria.  Neurological: Negative for dizziness, sensory change and focal weakness.  All other systems reviewed and are negative.   Nutrition: Renal Tolerating Diet: Yes Tolerating PT: Eval noted.    DRUG ALLERGIES:   Allergies  Allergen Reactions  . Sulfa Antibiotics Other (See Comments)    Reaction:  Blood in the urine     VITALS:  Blood pressure 104/55, pulse 116, temperature 97.8 F (36.6 C), temperature source Oral, resp. rate 20, height 5' (1.524 m), weight 86.4 kg (190 lb 7.6 oz), SpO2 98 %.  PHYSICAL EXAMINATION:   Physical Exam  GENERAL:  74 y.o.-year-old patient lying in the bed in mild respiratory distress  EYES: Pupils equal, round, reactive to light and accommodation. No scleral icterus. Extraocular muscles intact.  HEENT: Head atraumatic, normocephalic. Oropharynx and nasopharynx clear.  NECK:  Supple, no jugular venous distention. No thyroid enlargement, no tenderness.  LUNGS: Good air entry bilaterally. Diffuse rhonchi, wheezing b/l. No dullness to percussion. Negative use of accessory muscles. CARDIOVASCULAR: S1, S2 normal. No murmurs, rubs, or gallops.  ABDOMEN: Soft, nontender, nondistended. Bowel sounds present. No organomegaly or mass.  EXTREMITIES: No cyanosis, clubbing or  edema b/l.    NEUROLOGIC: Cranial nerves II through XII are intact. No focal Motor or sensory deficits b/l.   PSYCHIATRIC: The patient is alert and oriented x 3. Good affect.  SKIN: No obvious rash, lesion, or ulcer.   Left upper extremity AV fistula with good bruit and thrill.  LABORATORY PANEL:   CBC  Recent Labs Lab 12/11/14 0346  WBC 6.2  HGB 9.6*  HCT 30.9*  PLT 104*   ------------------------------------------------------------------------------------------------------------------  Chemistries   Recent Labs Lab 12/10/14 0403  NA 138  K 4.3  CL 96*  CO2 33*  GLUCOSE 285*  BUN 26*  CREATININE 2.54*  CALCIUM 8.0*   ------------------------------------------------------------------------------------------------------------------  Cardiac Enzymes  Recent Labs Lab 12/07/14 1817  TROPONINI 0.07*   ------------------------------------------------------------------------------------------------------------------  RADIOLOGY:  Dg Chest 1 View  12/11/2014  CLINICAL DATA:  Shortness of breath. EXAM: CHEST 1 VIEW COMPARISON:  12/07/2014 . FINDINGS: Mediastinum and hilar structures normal. Cardiomegaly with mild pulmonary vascular prominence. No focal infiltrate. No pleural effusion or pneumothorax. Old left third rib fracture. IMPRESSION: Cardiomegaly with pulmonary venous congestion. No evidence of overt pulmonary edema or focal infiltrate . Electronically Signed   By: Maisie Fus  Register   On: 12/11/2014 07:30   Ct Chest Wo Contrast  12/11/2014  CLINICAL DATA:  Short of breath.  Wheezing. EXAM: CT CHEST WITHOUT CONTRAST TECHNIQUE: Multidetector CT imaging of the chest was performed following the standard protocol without IV contrast. COMPARISON:  Current chest radiographs and prior exams. FINDINGS: Neck base and axilla: No mass or adenopathy. Thyroid is unremarkable. Mediastinum and hila: Heart mildly enlarged. There are marked coronary artery calcifications. Great vessels  are normal in  caliber. Atherosclerotic calcifications are noted along the course of the aorta and branch vessels. No mediastinal or hilar masses or evidence of adenopathy. Lungs and pleura: Minimal right pleural effusion. There are irregular nodular opacities and reticulonodular opacities in both upper lobes and, to lesser degree, in the superior aspect of the lower lobes, right greater than left. Largest nodular type opacity is in the posterior right upper lobe, image 12, series 3, measuring 8.5 mm. There is some dependent atelectasis in both lower lobes. No evidence of pulmonary edema. No pneumothorax. Limited upper abdomen: Liver shows morphologic changes consistent with cirrhosis. There is splenomegaly, with the spleen measuring 13.4 cm in greatest transverse dimension. No liver mass is seen on the included field of view. Dense atherosclerotic calcifications are noted along the visualized upper abdominal aorta and its branch vessels. Musculoskeletal: Bony structures are demineralized. There are degenerative changes throughout the visualized spine. No osteoblastic or osteolytic lesions. Chest wall: Unremarkable. IMPRESSION: 1. Irregular nodules and reticulonodular type opacities, mostly centrilobular in distribution, primarily affecting the upper lobes. Differential diagnosis includes infectious etiologies including tuberculosis and mycobacterium avium. Findings could be to the inflammation. Sarcoidosis can cause this pattern. 2. No evidence of pulmonary edema. 3. Mild cardiomegaly with dense coronary artery calcifications. Trace right pleural effusion. 4. Cirrhosis with findings of portal venous hypertension reflected by splenomegaly. Electronically Signed   By: Amie Portlandavid  Ormond M.D.   On: 12/11/2014 11:08     ASSESSMENT AND PLAN:   74 year old female with past medical history of end-stage renal disease on hemodialysis, GERD, hypothyroidism, type 2 diabetes without complication, depression, history of  previous MI, hypertension, history of CHF who presented to the hospital due to shortness of breath and cough and noted to be in acute respiratory failure.  #1 acute respiratory failure with hypoxia-due to CHF/acute bronchitis.  - slow to improve. Cont. IV steroids, Ceftriaxone, Zithromax, duonebs, pulmicort duonebs. -  Cont. HD and to have an extra session again today.  - CT chest today showing ?? Changes consistent w/ Sarcoidosis ?? TB/MAI infection. Will get Pulm. Consult.  No Hemoptysis.     #2 acute bronchitis-cont. Steroids, duonebs, Pulmicort nebs.  - cont. Ceftriaxone and cont. Zithromax.  - CT chest showing the above changes as mentioned.  Slow to improve and will get Pulm. Consult today.   #3 CHF-acute on chronic diastolic dysfunction. - had an extra HD treatment on 11/20 for fluid removal and getting HD again today.  - follow clinically.  Assess for Home O2 prior to discharge.   #4 type 2 diabetes with renal complication-continue Lantus, sliding scale insulin and follow blood sugars.  #5 hypothyroidism-continue Synthroid.  #6 hypertension-continue Imdur.  #7 secondary hyperparathyroidism-continue PhosLo.  #8 history of coronary artery disease-no acute chest pain. -Continue aspirin, Plavix, statin.  #9 restless leg syndrome-continue Requip.  #10 diabetic neuropathy-continue Lyrica.   All the records are reviewed and case discussed with Care Management/Social Workerr. Management plans discussed with the patient, family and they are in agreement.  CODE STATUS: Full  DVT Prophylaxis: Heparin subcutaneous  TOTAL TIME TAKING CARE OF THIS PATIENT: 30 minutes.   POSSIBLE D/C IN 1-2 DAYS, DEPENDING ON CLINICAL CONDITION.   Houston SirenSAINANI,Keyvin Rison J M.D on 12/11/2014 at 3:43 PM  Between 7am to 6pm - Pager - [phone]  After 6pm go to [email or web address] - password [email or web address] [email or web address]  [facility]  Office  [phone]  CC: Primary care physician; Bobbye MortonSHARON A REILLY, MD

## 2014-12-11 NOTE — Progress Notes (Signed)
PT Cancellation Note  Patient Details Name: Savannah Key MRN: [id number] DOB: 07/14/1940   Cancelled Treatment:    Reason Eval/Treat Not Completed: Patient at procedure or test/unavailable (Patient currently off unit for dialysis; will re-attempt at later time/date as patient available and medically appropriate.)   Kalli Greenfield H. Manson PasseyBrown, PT, DPT, NCS 12/11/2014, 11:45 AM [phone]

## 2014-12-11 NOTE — Progress Notes (Signed)
HD tx completed.

## 2014-12-12 LAB — CBC
HCT: 31.8 % — ABNORMAL LOW (ref 35.0–47.0)
Hemoglobin: 10.3 g/dL — ABNORMAL LOW (ref 12.0–16.0)
MCH: 27.7 pg (ref 26.0–34.0)
MCHC: 32.3 g/dL (ref 32.0–36.0)
MCV: 85.8 fL (ref 80.0–100.0)
Platelets: 123 K/uL — ABNORMAL LOW (ref 150–440)
RBC: 3.71 MIL/uL — ABNORMAL LOW (ref 3.80–5.20)
RDW: 22.8 % — ABNORMAL HIGH (ref 11.5–14.5)
WBC: 7.9 K/uL (ref 3.6–11.0)

## 2014-12-12 LAB — GLUCOSE, CAPILLARY
Glucose-Capillary: 244 mg/dL — ABNORMAL HIGH (ref 65–99)
Glucose-Capillary: 338 mg/dL — ABNORMAL HIGH (ref 65–99)
Glucose-Capillary: 122 mg/dL — ABNORMAL HIGH (ref 65–99)
Glucose-Capillary: 147 mg/dL — ABNORMAL HIGH (ref 65–99)

## 2014-12-12 NOTE — Progress Notes (Signed)
   12/12/14 0815  Oxygen Therapy  SpO2 (!) 85 % (at rest)  O2 Device Room Air  O2 Flow Rate (L/min) 0 L/min

## 2014-12-12 NOTE — Progress Notes (Signed)
HD TX Initiation 

## 2014-12-12 NOTE — Progress Notes (Signed)
Pre Tx Documentation

## 2014-12-12 NOTE — Progress Notes (Signed)
Pre TX Assessment

## 2014-12-12 NOTE — Progress Notes (Signed)
HD TX Complete

## 2014-12-12 NOTE — Progress Notes (Signed)
Patient ID: Savannah Key, female   DOB: 06/12/1940, 74 y.o.   MRN: [id number] [facility] PROGRESS NOTE  PCP: Bobbye MortonSHARON A REILLY, MD  HPI/Subjective: Patient still not feeling well with regards to her breathing. She gets into coughing fits and can't stop. Nothing is productive. Some chest pain with the difficulty breathing.  Objective: Filed Vitals:   12/12/14 1415 12/12/14 1430  BP: 107/57 104/64  Pulse: 109 114  Temp:    Resp: 15 26    Filed Weights   12/11/14 1454 12/12/14 0343 12/12/14 1410  Weight: 86.4 kg (190 lb 7.6 oz) 85.186 kg (187 lb 12.8 oz) 88.3 kg (194 lb 10.7 oz)    ROS: Review of Systems  Constitutional: Negative for fever and chills.  Eyes: Negative for blurred vision.  Respiratory: Positive for cough and shortness of breath.   Cardiovascular: Positive for chest pain.  Gastrointestinal: Negative for nausea, vomiting, abdominal pain, diarrhea and constipation.  Genitourinary: Negative for dysuria.  Musculoskeletal: Negative for joint pain.  Neurological: Negative for dizziness and headaches.   Exam: Physical Exam  Constitutional: She is oriented to person, place, and time.  HENT:  Nose: No mucosal edema.  Mouth/Throat: No oropharyngeal exudate or posterior oropharyngeal edema.  Eyes: Conjunctivae, EOM and lids are normal. Pupils are equal, round, and reactive to light.  Neck: No JVD present. Carotid bruit is not present. No edema present. No thyroid mass and no thyromegaly present.  Cardiovascular: S1 normal and S2 normal.  Exam reveals no gallop.   No murmur heard. Pulses:      Dorsalis pedis pulses are 2+ on the right side, and 2+ on the left side.  Respiratory: No respiratory distress. She has decreased breath sounds in the right middle field, the right lower field, the left middle field and the left lower field. She has wheezes in the left middle field and the left lower field. She has no rhonchi. She has no rales.  GI: Soft. Bowel sounds  are normal. There is no tenderness.  Musculoskeletal:       Right ankle: She exhibits swelling.       Left ankle: She exhibits swelling.  Lymphadenopathy:    She has no cervical adenopathy.  Neurological: She is alert and oriented to person, place, and time. No cranial nerve deficit.  Skin: Skin is warm. No rash noted. Nails show no clubbing.  Psychiatric: She has a normal mood and affect.    Data Reviewed: Basic Metabolic Panel:  Recent Labs Lab 12/07/14 1817 12/08/14 0428 12/09/14 1552 12/10/14 0403  NA 138 138 132* 138  K 3.8 4.6 4.7 4.3  CL 101 99* 97* 96*  CO2 28 29 26  33*  GLUCOSE 130* 273* 438* 285*  BUN 15 26* 52* 26*  CREATININE 2.09* 2.77* 3.60* 2.54*  CALCIUM 8.0* 8.0* 7.7* 8.0*  PHOS  --   --  4.0  4.0  --    Liver Function Tests:  Recent Labs Lab 12/09/14 1552  ALBUMIN 3.0*   CBC:  Recent Labs Lab 12/07/14 1817 12/09/14 1621 12/10/14 0403 12/11/14 0346 12/12/14 0514  WBC 7.7 7.3 6.7 6.2 7.9  HGB 9.4* 9.1* 9.3* 9.6* 10.3*  HCT 29.1* 28.6* 29.2* 30.9* 31.8*  MCV 84.2 86.1 86.8 86.4 85.8  PLT 89* 100* 93* 104* 123*   Cardiac Enzymes:  Recent Labs Lab 12/07/14 1817  TROPONINI 0.07*   BNP (last 3 results)  Recent Labs  10/01/14 0910 12/07/14 1817  BNP 1435.0* 1105.0*  CBG:  Recent Labs Lab 12/11/14 0734 12/11/14 1625 12/11/14 2052 12/12/14 0740 12/12/14 1132  GLUCAP 275* 121* 288* 244* 338*    Studies: Dg Chest 1 View  12/11/2014  CLINICAL DATA:  Shortness of breath. EXAM: CHEST 1 VIEW COMPARISON:  12/07/2014 . FINDINGS: Mediastinum and hilar structures normal. Cardiomegaly with mild pulmonary vascular prominence. No focal infiltrate. No pleural effusion or pneumothorax. Old left third rib fracture. IMPRESSION: Cardiomegaly with pulmonary venous congestion. No evidence of overt pulmonary edema or focal infiltrate . Electronically Signed   By: Maisie Fus  Register   On: 12/11/2014 07:30   Ct Chest Wo Contrast  12/11/2014   CLINICAL DATA:  Short of breath.  Wheezing. EXAM: CT CHEST WITHOUT CONTRAST TECHNIQUE: Multidetector CT imaging of the chest was performed following the standard protocol without IV contrast. COMPARISON:  Current chest radiographs and prior exams. FINDINGS: Neck base and axilla: No mass or adenopathy. Thyroid is unremarkable. Mediastinum and hila: Heart mildly enlarged. There are marked coronary artery calcifications. Great vessels are normal in caliber. Atherosclerotic calcifications are noted along the course of the aorta and branch vessels. No mediastinal or hilar masses or evidence of adenopathy. Lungs and pleura: Minimal right pleural effusion. There are irregular nodular opacities and reticulonodular opacities in both upper lobes and, to lesser degree, in the superior aspect of the lower lobes, right greater than left. Largest nodular type opacity is in the posterior right upper lobe, image 12, series 3, measuring 8.5 mm. There is some dependent atelectasis in both lower lobes. No evidence of pulmonary edema. No pneumothorax. Limited upper abdomen: Liver shows morphologic changes consistent with cirrhosis. There is splenomegaly, with the spleen measuring 13.4 cm in greatest transverse dimension. No liver mass is seen on the included field of view. Dense atherosclerotic calcifications are noted along the visualized upper abdominal aorta and its branch vessels. Musculoskeletal: Bony structures are demineralized. There are degenerative changes throughout the visualized spine. No osteoblastic or osteolytic lesions. Chest wall: Unremarkable. IMPRESSION: 1. Irregular nodules and reticulonodular type opacities, mostly centrilobular in distribution, primarily affecting the upper lobes. Differential diagnosis includes infectious etiologies including tuberculosis and mycobacterium avium. Findings could be to the inflammation. Sarcoidosis can cause this pattern. 2. No evidence of pulmonary edema. 3. Mild cardiomegaly  with dense coronary artery calcifications. Trace right pleural effusion. 4. Cirrhosis with findings of portal venous hypertension reflected by splenomegaly. Electronically Signed   By: Amie Portland M.D.   On: 12/11/2014 11:08    Scheduled Meds: . aspirin EC  81 mg Oral Daily  . atorvastatin  80 mg Oral QHS  . azithromycin  250 mg Oral Daily  . budesonide (PULMICORT) nebulizer solution  0.25 mg Nebulization BID  . calcium acetate  667 mg Oral TID WC  . cefTRIAXone (ROCEPHIN)  IV  1 g Intravenous Q24H  . clopidogrel  75 mg Oral Daily  . docusate sodium  100 mg Oral QHS  . epoetin (EPOGEN/PROCRIT) injection  10,000 Units Intravenous Q M,W,F-HD  . famotidine  10 mg Oral Daily  . furosemide  40 mg Intravenous Once  . heparin  5,000 Units Subcutaneous 3 times per day  . insulin aspart  0-5 Units Subcutaneous QHS  . insulin aspart  0-9 Units Subcutaneous TID WC  . insulin glargine  90 Units Subcutaneous Daily  . ipratropium-albuterol  3 mL Nebulization Q4H  . isosorbide mononitrate  60 mg Oral Daily  . levothyroxine  75 mcg Oral QAC breakfast  . loratadine  10 mg Oral Daily  .  methylPREDNISolone (SOLU-MEDROL) injection  40 mg Intravenous 3 times per day  . metoprolol tartrate  12.5 mg Oral BID  . pantoprazole  40 mg Oral BID  . pregabalin  75 mg Oral QHS  . rOPINIRole  2 mg Oral QHS  . sodium chloride  3 mL Intravenous Q12H  . traZODone  50 mg Oral QHS  . venlafaxine XR  37.5 mg Oral QHS    Assessment/Plan:  1. Asthmatic bronchitis- patient is on Rocephin and IV Solu-Medrol. Looks like Zithromax is finishing up. Pulmicort and DuoNeb nebulizers. 2. Acute on chronic systolic congestive heart failure- the patient has been dialyzed to get rid of fluid. Since the patient is still short of breath I think it's likely a bronchitis rather than heart failure as the main issue. Patient is on metoprolol and Lasix 3. End-stage renal disease on hemodialysis- patient seen on hemodialysis  today 4. Hypothyroidism unspecified continue levothyroxine 5. Gastroesophageal reflux disease without esophagitis continue Protonix 6. Cirrhosis seen on imaging of the liver 7. Type 2 diabetes with hyperglycemia secondary to steroids continue sliding scale insulin and Lantus insulin 8. Hyperlipidemia unspecified continue atorvastatin 9. Depression- no changes in psychiatric medications  Code Status:     Code Status Orders        Start     Ordered   12/07/14 2212  Full code   Continuous     12/07/14 2211     Disposition Plan: Home when breathing better  Time spent: 25 minutes  Alford Highland  [facility]

## 2014-12-12 NOTE — Care Management Important Message (Signed)
Important Message  Patient Details  Name: Savannah Key MRN: [id number] Date of Birth: 07/17/1940   Medicare Important Message Given:  Yes    Olegario MessierKathy A Nathaneal Sommers 12/12/2014, 10:28 AM

## 2014-12-12 NOTE — Progress Notes (Signed)
[facility]  ROUNDING NOTE   Subjective:  Patient was dialyzed Monday and Tuesday. Tolerated well. 1500 cc of fluid was removed each day Continues to have wheezing and still feels that her breathing is not adequate Off of oxygen today   Objective:  Vital signs in last 24 hours:  Temp:  [97.7 F (36.5 C)-98.8 F (37.1 C)] 98.8 F (37.1 C) (11/23 1135) Pulse Rate:  [78-117] 107 (11/23 1135) Resp:  [16-24] 16 (11/23 1135) BP: (91-153)/(47-115) 104/72 mmHg (11/23 1135) SpO2:  [85 %-100 %] 97 % (11/23 1142) Weight:  [85.186 kg (187 lb 12.8 oz)-86.4 kg (190 lb 7.6 oz)] 85.186 kg (187 lb 12.8 oz) (11/23 0343)  Weight change: -0.2 kg (-7.1 oz) Filed Weights   12/11/14 1130 12/11/14 1454 12/12/14 0343  Weight: 87.8 kg (193 lb 9 oz) 86.4 kg (190 lb 7.6 oz) 85.186 kg (187 lb 12.8 oz)    Intake/Output: I/O last 3 completed shifts: In: 480 [P.O.:480] Out: 1500 [Other:1500]   Intake/Output this shift:  Total I/O In: 120 [P.O.:120] Out: 25 [Urine:25]     Last HD  Flowsheet 11/22:    HEMODIALYSIS FLOWSHEET: 11/22  Blood Flow Rate (mL/min): 300 mL/min Arterial Pressure (mmHg): -100 mmHg Venous Pressure (mmHg): 110 mmHg Transmembrane Pressure (mmHg): 50 mmHg Ultrafiltration Rate (mL/min): 670 mL/min Dialysate Flow Rate (mL/min): 600 ml/min Conductivity: Machine : 14 Conductivity: Machine : 14 Dialysis Fluid Bolus: Normal Saline Bolus Amount (mL): 250 mL Intra-Hemodialysis Comments: ..Tx completed.   Physical Exam: General: NAD, sitting up in bed  Head: Normocephalic, atraumatic. Moist oral mucosal membranes  Eyes: Anicteric  Neck: Supple, trachea midline  Lungs:  Bilateral rhonchi noted, basilar rales, wheezing noted.  Heart: S1S2 no rubs  Abdomen:  Soft, nontender, BS present   Extremities:  1+ peripheral edema.  Neurologic: Nonfocal, moving all four extremities  Skin: No lesions  Access: LUE AVF    Basic Metabolic Panel:  Recent Labs Lab  12/07/14 1817 12/08/14 0428 12/09/14 1552 12/10/14 0403  NA 138 138 132* 138  K 3.8 4.6 4.7 4.3  CL 101 99* 97* 96*  CO2 33*  GLUCOSE 130* 273* 438* 285*  BUN 15 26* 52* 26*  CREATININE 2.09* 2.77* 3.60* 2.54*  CALCIUM 8.0* 8.0* 7.7* 8.0*  PHOS  --   --  4.0  4.0  --     Liver Function Tests:  Recent Labs Lab 12/09/14 1552  ALBUMIN 3.0*   No results for input(s): LIPASE, AMYLASE in the last 168 hours. No results for input(s): AMMONIA in the last 168 hours.  CBC:  Recent Labs Lab 12/07/14 1817 12/09/14 1621 12/10/14 0403 12/11/14 0346 12/12/14 0514  WBC 7.7 7.3 6.7 6.2 7.9  HGB 9.4* 9.1* 9.3* 9.6* 10.3*  HCT 29.1* 28.6* 29.2* 30.9* 31.8*  MCV 84.2 86.1 86.8 86.4 85.8  PLT 89* 100* 93* 104* 123*    Cardiac Enzymes:  Recent Labs Lab 12/07/14 1817  TROPONINI 0.07*    BNP: Invalid input(s): POCBNP  CBG:  Recent Labs Lab 12/11/14 0734 12/11/14 1625 12/11/14 2052 12/12/14 0740 12/12/14 1132  GLUCAP 275* 121* 288* 244* 338*    Microbiology: Results for orders placed or performed during the hospital encounter of 10/01/14  Blood culture (routine x 2)     Status: None   Collection Time: 10/01/14  4:40 AM  Result Value Ref Range Status   Specimen Description BLOOD RIGHT THUMB  Final   Special Requests BOTTLES DRAWN AEROBIC AND ANAEROBIC  Final   Culture NO GROWTH 5 DAYS  Final   Report Status 10/06/2014 FINAL  Final  Blood culture (routine x 2)     Status: None   Collection Time: 10/01/14  4:40 AM  Result Value Ref Range Status   Specimen Description BLOOD RIGHT HAND  Final   Special Requests BOTTLES DRAWN AEROBIC AND ANAEROBIC 2ML  Final   Culture NO GROWTH 5 DAYS  Final   Report Status 10/06/2014 FINAL  Final  MRSA PCR Screening     Status: None   Collection Time: 10/01/14  8:47 AM  Result Value Ref Range Status   MRSA by PCR NEGATIVE NEGATIVE Final    Comment:        The GeneXpert MRSA Assay (FDA approved for NASAL  specimens only), is one component of a comprehensive MRSA colonization surveillance program. It is not intended to diagnose MRSA infection nor to guide or monitor treatment for MRSA infections.     Coagulation Studies: No results for input(s): LABPROT, INR in the last 72 hours.  Urinalysis: No results for input(s): COLORURINE, LABSPEC, PHURINE, GLUCOSEU, HGBUR, BILIRUBINUR, KETONESUR, PROTEINUR, UROBILINOGEN, NITRITE, LEUKOCYTESUR in the last 72 hours.  Invalid input(s): APPERANCEUR    Imaging: Dg Chest 1 View  12/11/2014  CLINICAL DATA:  Shortness of breath. EXAM: CHEST 1 VIEW COMPARISON:  12/07/2014 . FINDINGS: Mediastinum and hilar structures normal. Cardiomegaly with mild pulmonary vascular prominence. No focal infiltrate. No pleural effusion or pneumothorax. Old left third rib fracture. IMPRESSION: Cardiomegaly with pulmonary venous congestion. No evidence of overt pulmonary edema or focal infiltrate . Electronically Signed   By: Maisie Fushomas  Register   On: 12/11/2014 07:30   Ct Chest Wo Contrast  12/11/2014  CLINICAL DATA:  Short of breath.  Wheezing. EXAM: CT CHEST WITHOUT CONTRAST TECHNIQUE: Multidetector CT imaging of the chest was performed following the standard protocol without IV contrast. COMPARISON:  Current chest radiographs and prior exams. FINDINGS: Neck base and axilla: No mass or adenopathy. Thyroid is unremarkable. Mediastinum and hila: Heart mildly enlarged. There are marked coronary artery calcifications. Great vessels are normal in caliber. Atherosclerotic calcifications are noted along the course of the aorta and branch vessels. No mediastinal or hilar masses or evidence of adenopathy. Lungs and pleura: Minimal right pleural effusion. There are irregular nodular opacities and reticulonodular opacities in both upper lobes and, to lesser degree, in the superior aspect of the lower lobes, right greater than left. Largest nodular type opacity is in the posterior right upper  lobe, image 12, series 3, measuring 8.5 mm. There is some dependent atelectasis in both lower lobes. No evidence of pulmonary edema. No pneumothorax. Limited upper abdomen: Liver shows morphologic changes consistent with cirrhosis. There is splenomegaly, with the spleen measuring 13.4 cm in greatest transverse dimension. No liver mass is seen on the included field of view. Dense atherosclerotic calcifications are noted along the visualized upper abdominal aorta and its branch vessels. Musculoskeletal: Bony structures are demineralized. There are degenerative changes throughout the visualized spine. No osteoblastic or osteolytic lesions. Chest wall: Unremarkable. IMPRESSION: 1. Irregular nodules and reticulonodular type opacities, mostly centrilobular in distribution, primarily affecting the upper lobes. Differential diagnosis includes infectious etiologies including tuberculosis and mycobacterium avium. Findings could be to the inflammation. Sarcoidosis can cause this pattern. 2. No evidence of pulmonary edema. 3. Mild cardiomegaly with dense coronary artery calcifications. Trace right pleural effusion. 4. Cirrhosis with findings of portal venous hypertension reflected by splenomegaly. Electronically Signed   By: Renard Hamperavid  Ormond M.D.  On: 12/11/2014 11:08     Medications:     . aspirin EC  81 mg Oral Daily  . atorvastatin  80 mg Oral QHS  . azithromycin  250 mg Oral Daily  . budesonide (PULMICORT) nebulizer solution  0.25 mg Nebulization BID  . calcium acetate  667 mg Oral TID WC  . cefTRIAXone (ROCEPHIN)  IV  1 g Intravenous Q24H  . clopidogrel  75 mg Oral Daily  . docusate sodium  100 mg Oral QHS  . epoetin (EPOGEN/PROCRIT) injection  10,000 Units Intravenous Q M,W,F-HD  . famotidine  10 mg Oral Daily  . furosemide  40 mg Intravenous Once  . heparin  5,000 Units Subcutaneous 3 times per day  . insulin aspart  0-5 Units Subcutaneous QHS  . insulin aspart  0-9 Units Subcutaneous TID WC  . insulin  glargine  90 Units Subcutaneous Daily  . ipratropium-albuterol  3 mL Nebulization Q4H  . isosorbide mononitrate  60 mg Oral Daily  . levothyroxine  75 mcg Oral QAC breakfast  . loratadine  10 mg Oral Daily  . methylPREDNISolone (SOLU-MEDROL) injection  40 mg Intravenous 3 times per day  . metoprolol tartrate  12.5 mg Oral BID  . pantoprazole  40 mg Oral BID  . pregabalin  75 mg Oral QHS  . rOPINIRole  2 mg Oral QHS  . sodium chloride  3 mL Intravenous Q12H  . traZODone  50 mg Oral QHS  . venlafaxine XR  37.5 mg Oral QHS   sodium chloride, sodium chloride, acetaminophen **OR** acetaminophen, alteplase, benzonatate, heparin, lidocaine (PF), lidocaine-prilocaine, morphine injection, nitroGLYCERIN, ondansetron **OR** ondansetron (ZOFRAN) IV, oxyCODONE, pentafluoroprop-tetrafluoroeth, sodium chloride  Assessment/ Plan:  74 y.o. female with end-stage renal disease, carotid stenosis, coronary artery disease, diabetes type 2, history of myocardial infarction, hypertension, peripheral vascular disease  1.  ESRD on HD MWF:   - hemodialysis done yesterday (extra treatment) 1500 cc of fluid was removed - HD  Today  (regular day treatment)   2.  Anemia of CKD:  continue epogen 10000 units IV with HD.  3.  SHPTH:  - Monitor phosphorus, continue phoslo.  4.  Shortness of breath/pulmonary edema/COPD:   -  Steroids/nebs/O2. - UF with HD as tolerated. treatment today - CT of the chest - findings as above. Pulm consult pending   LOS: 5 [place] 11/23/201612:33 PM

## 2014-12-12 NOTE — Progress Notes (Signed)
   12/12/14 0815  Oxygen Therapy  SpO2 (!) 85 % (at rest)  O2 Device Room Air  O2 Flow Rate (L/min) 0 L/min   

## 2014-12-12 NOTE — Progress Notes (Signed)
Post Tx Documentation

## 2014-12-12 NOTE — Progress Notes (Signed)
Post Tx Assesment

## 2014-12-12 NOTE — Progress Notes (Signed)
PT Cancellation Note  Patient Details Name: Savannah ShawlLinda M Shiffman MRN: [id number] DOB: 12/24/1940   Cancelled Treatment:    Reason Eval/Treat Not Completed: Patient at procedure or test/unavailable. Pt at hemodialysis. Re attempt treatment tomorrow as the schedule allows.    Elsie StainHeidi Elizabeth Bishop 12/12/2014, 3:26 PM

## 2014-12-13 DIAGNOSIS — R05 Cough: Secondary | ICD-10-CM

## 2014-12-13 DIAGNOSIS — R0600 Dyspnea, unspecified: Secondary | ICD-10-CM

## 2014-12-13 DIAGNOSIS — I5023 Acute on chronic systolic (congestive) heart failure: Secondary | ICD-10-CM

## 2014-12-13 DIAGNOSIS — J40 Bronchitis, not specified as acute or chronic: Secondary | ICD-10-CM

## 2014-12-13 LAB — GLUCOSE, CAPILLARY
GLUCOSE-CAPILLARY: 364 mg/dL — AB (ref 65–99)
Glucose-Capillary: 224 mg/dL — ABNORMAL HIGH (ref 65–99)
Glucose-Capillary: 381 mg/dL — ABNORMAL HIGH (ref 65–99)

## 2014-12-13 MED ORDER — ACETYLCYSTEINE 20 % IN SOLN
2.0000 mL | Freq: Four times a day (QID) | RESPIRATORY_TRACT | Status: DC
  Administered 2014-12-13 (×2): 4 mL via RESPIRATORY_TRACT
  Administered 2014-12-14 (×3): 2 mL via RESPIRATORY_TRACT
  Administered 2014-12-14 – 2014-12-15 (×2): 4 mL via RESPIRATORY_TRACT
  Administered 2014-12-15: 2 mL via RESPIRATORY_TRACT
  Filled 2014-12-13 (×8): qty 4

## 2014-12-13 MED ORDER — IPRATROPIUM-ALBUTEROL 0.5-2.5 (3) MG/3ML IN SOLN
3.0000 mL | Freq: Four times a day (QID) | RESPIRATORY_TRACT | Status: AC
Start: 2014-12-13 — End: 2014-12-15
  Administered 2014-12-13 – 2014-12-15 (×7): 3 mL via RESPIRATORY_TRACT
  Filled 2014-12-13 (×7): qty 3

## 2014-12-13 MED ORDER — IPRATROPIUM-ALBUTEROL 0.5-2.5 (3) MG/3ML IN SOLN
3.0000 mL | RESPIRATORY_TRACT | Status: DC | PRN
  Administered 2014-12-13 – 2014-12-18 (×4): 3 mL via RESPIRATORY_TRACT
  Filled 2014-12-13 (×4): qty 3

## 2014-12-13 MED ORDER — NYSTATIN 100000 UNIT/ML MT SUSP
5.0000 mL | Freq: Four times a day (QID) | OROMUCOSAL | Status: DC
  Administered 2014-12-13 – 2014-12-19 (×18): 500000 [IU] via ORAL
  Filled 2014-12-13 (×29): qty 5

## 2014-12-13 MED ORDER — NEPRO/CARBSTEADY PO LIQD
237.0000 mL | Freq: Three times a day (TID) | ORAL | Status: DC
  Administered 2014-12-13 – 2014-12-18 (×11): 237 mL via ORAL

## 2014-12-13 NOTE — Consult Note (Signed)
PULMONARY / CRITICAL CARE MEDICINE   Name: Savannah Key MRN: [id number] DOB: 12-Feb-1940    ADMISSION DATE:  12/07/2014 CONSULTATION DATE:  12/13/14  REFERRING MD :  Dr. Hilton Sinclair   CHIEF COMPLAINT:     Shortness of breath   HISTORY OF PRESENT ILLNESS   74 y.o. female with a known history of systolic congestive heart failure EF 45-50%, end-stage renal disease on hemodialysis Monday Wednesday Friday presenting with shortness of breath and cough on 12/07/14. She describes cough nonproductive for approximately 2 week total duration now with associated shortness of breath she was breath both with exertion and now at rest she also attests to having lower extremity edema worsening with unusual as well as orthopnea which she sleeps in a recliner. She did receive dialysis today however states that she was under her dry weight so did not have any volume removed on the day of admission. She has gotten extra dialysis, but still with sob. She has a significant hx of second hand smoke exposure>35 years from her husband.     SIGNIFICANT EVENTS     PAST MEDICAL HISTORY    :  Past Medical History  Diagnosis Date  . Chronic kidney disease   . GERD (gastroesophageal reflux disease)   . Carotid artery stenosis   . Coronary artery disease   . Hypothyroidism   . Diabetes mellitus without complication (HCC)   . Depression   . Arthritis   . Kidney failure   . Liver failure (HCC)   . MI (myocardial infarction) (HCC)   . HTN (hypertension)   . Broken leg     R  . CHF (congestive heart failure) [facility])    Past Surgical History  Procedure Laterality Date  . Av fistula placement Left   . Cholecystectomy    . Peripheral vascular catheterization N/A 06/11/2014    Procedure: A/V Shuntogram/Fistulagram;  Surgeon: Annice Needy, MD;  Location: [facility] INVASIVE CV LAB;  Service: Cardiovascular;  Laterality: N/A;  . Peripheral vascular catheterization Left 06/11/2014    Procedure: A/V Shunt Intervention;   Surgeon: Annice Needy, MD;  Location: [facility] INV[facility];  Service: Cardiovascular;  Laterality: Left;  . Open reduction internal fixation (orif) distal radial fracture Left 08/09/2014    Procedure: OPEN REDUCTION INTERNAL FIXATION (ORIF) DISTAL RADIAL FRACTURE;  Surgeon: Kennedy Bucker, MD;  Location: [facility] ORS;  Service: Orthopedics;  Laterality: Left;  . Peripheral vascular catheterization Left 11/06/2014    Procedure: A/V Shuntogram/Fistulagram;  Surgeon: Renford Dills, MD;  Location: [facility] CV [facility];  Service: Cardiovascular;  Laterality: Left;  . Peripheral vascular catheterization Left 11/06/2014    Procedure: A/V Shunt Intervention;  Surgeon: Renford Dills, MD;  Location: [facility];  Service: Cardiovascular;  Laterality: Left;   Prior to Admission medications   Medication Sig Start Date End Date Taking? Authorizing Provider  aspirin EC 81 MG tablet Take 81 mg by mouth daily.    Yes Historical Provider, MD  atorvastatin (LIPITOR) 80 MG tablet Take 80 mg by mouth at bedtime.   Yes Historical Provider, MD  benzonatate (TESSALON) 100 MG capsule Take 100 mg by mouth 3 (three) times daily as needed for cough.   Yes Historical Provider, MD  calcium acetate (PHOSLO) 667 MG capsule Take 667 mg by mouth 3 (three) times daily with meals.   Yes Historical Provider, MD  cephALEXin (KEFLEX) 250 MG capsule Take 250 mg by mouth at bedtime.    Yes Historical Provider, MD  cetirizine (ZYRTEC) 5 MG tablet Take 5 mg by mouth at bedtime.    Yes Historical Provider, MD  clopidogrel (PLAVIX) 75 MG tablet Take 1 tablet (75 mg total) by mouth daily. 10/04/14  Yes Srikar Sudini, MD  docusate sodium (COLACE) 100 MG capsule Take 100 mg by mouth at bedtime.   Yes Historical Provider, MD  insulin glargine (LANTUS) 100 UNIT/ML injection Inject 90 Units into the skin daily.    Yes Historical Provider, MD  insulin NPH Human (HUMULIN N,NOVOLIN N) 100 UNIT/ML injection Inject 15 Units into the skin 3  (three) times daily before meals.    Yes Historical Provider, MD  isosorbide mononitrate (IMDUR) 60 MG 24 hr tablet Take 60 mg by mouth daily.    Yes Historical Provider, MD  levothyroxine (SYNTHROID, LEVOTHROID) 75 MCG tablet Take 75 mcg by mouth daily.   Yes Historical Provider, MD  loperamide (IMODIUM) 2 MG capsule Take 2 mg by mouth as needed for diarrhea or loose stools.   Yes Historical Provider, MD  nitroGLYCERIN (NITROSTAT) 0.4 MG SL tablet Place 0.4 mg under the tongue every 5 (five) minutes as needed for chest pain.   Yes Historical Provider, MD  pantoprazole (PROTONIX) 40 MG tablet Take 40 mg by mouth 2 (two) times daily.   Yes Historical Provider, MD  pregabalin (LYRICA) 75 MG capsule Take 75 mg by mouth at bedtime.   Yes Historical Provider, MD  ranitidine (ZANTAC) 150 MG tablet Take 150 mg by mouth at bedtime.   Yes Historical Provider, MD  rOPINIRole (REQUIP) 2 MG tablet Take 2 mg by mouth at bedtime.   Yes Historical Provider, MD  sodium chloride (OCEAN) 0.65 % SOLN nasal spray Place 1 spray into both nostrils as needed for congestion.   Yes Historical Provider, MD  traZODone (DESYREL) 50 MG tablet Take 50 mg by mouth at bedtime.   Yes Historical Provider, MD  venlafaxine XR (EFFEXOR-XR) 37.5 MG 24 hr capsule Take 37.5 mg by mouth at bedtime.   Yes Historical Provider, MD  oxyCODONE-acetaminophen (ROXICET) 5-325 MG per tablet Take 1 tablet by mouth every 8 (eight) hours as needed for moderate pain or severe pain (Do not drive or operate heavy machinery while taking as can cause drowsiness.). Patient not taking: Reported on 12/07/2014 07/15/14   Renford DillsLindsey Miller, NP   Allergies  Allergen Reactions  . Sulfa Antibiotics Other (See Comments)    Reaction:  Blood in the urine      FAMILY HISTORY   Family History  Problem Relation Age of Onset  . Hypertension Other       SOCIAL HISTORY    reports that she has never smoked. She has never used smokeless tobacco. She reports that  she does not drink alcohol or use illicit drugs.  Review of Systems  Constitutional: Negative for fever, chills and weight loss.  HENT: Negative for hearing loss and tinnitus.   Eyes: Negative for blurred vision and double vision.  Respiratory: Positive for cough and shortness of breath.   Cardiovascular: Negative for chest pain.  Gastrointestinal: Negative for heartburn, nausea, vomiting and abdominal pain.  Genitourinary: Negative for dysuria and urgency.  Skin: Negative for itching and rash.  Neurological: Positive for weakness. Negative for dizziness and headaches.  Endo/Heme/Allergies: Does not bruise/bleed easily.  Psychiatric/Behavioral: Negative for depression.      VITAL SIGNS    Temp:  [97.4 F (36.3 C)-98.6 F (37 C)] 98.6 F (37 C) (11/24 1135) Pulse Rate:  [51-114] 51 (11/24 1135)  Resp:  [15-26] 18 (11/24 1135) BP: (83-142)/(40-78) 98/58 mmHg (11/24 1135) SpO2:  [91 %-100 %] 91 % (11/24 1135) Weight:  [187 lb 13.3 oz (85.2 kg)-194 lb 10.7 oz (88.3 kg)] 188 lb 3.2 oz (85.367 kg) (11/24 0704) HEMODYNAMICS:   VENTILATOR SETTINGS:   INTAKE / OUTPUT:  Intake/Output Summary (Last 24 hours) at 12/13/14 1346 Last data filed at 12/13/14 1100  Gross per 24 hour  Intake    240 ml  Output   1972 ml  Net  -1732 ml       PHYSICAL EXAM   Physical Exam  Constitutional: She is oriented to person, place, and time. She appears well-developed and well-nourished.  HENT:  Head: Normocephalic and atraumatic.  Mild post oropharynx thrush, wears dentures  Eyes: Conjunctivae and EOM are normal. Pupils are equal, round, and reactive to light.  Neck: Normal range of motion. Neck supple.  Cardiovascular: Normal rate, regular rhythm, normal heart sounds and intact distal pulses.   Pulmonary/Chest: Effort normal.  Coarse upper airway sounds Dec bs at the bases Dry cough  Abdominal: Soft. Bowel sounds are normal.  Musculoskeletal: Normal range of motion.  Neurological: She  is alert and oriented to person, place, and time.  Skin: Skin is warm and dry.  Vitals reviewed.      LABS   LABS:  CBC  Recent Labs Lab 12/10/14 0403 12/11/14 0346 12/12/14 0514  WBC 6.7 6.2 7.9  HGB 9.3* 9.6* 10.3*  HCT 29.2* 30.9* 31.8*  PLT 93* 104* 123*   Coag's No results for input(s): APTT, INR in the last 168 hours. BMET  Recent Labs Lab 12/08/14 0428 12/09/14 1552 12/10/14 0403  NA 138 132* 138  K 4.6 4.7 4.3  CL 99* 97* 96*  CO2 29 26 33*  BUN 26* 52* 26*  CREATININE 2.77* 3.60* 2.54*  GLUCOSE 273* 438* 285*   Electrolytes  Recent Labs Lab 12/08/14 0428 12/09/14 1552 12/10/14 0403  CALCIUM 8.0* 7.7* 8.0*  PHOS  --  4.0  4.0  --    Sepsis Markers No results for input(s): LATICACIDVEN, PROCALCITON, O2SATVEN in the last 168 hours. ABG No results for input(s): PHART, PCO2ART, PO2ART in the last 168 hours. Liver Enzymes  Recent Labs Lab 12/09/14 1552  ALBUMIN 3.0*   Cardiac Enzymes  Recent Labs Lab 12/07/14 1817  TROPONINI 0.07*   Glucose  Recent Labs Lab 12/12/14 0740 12/12/14 1132 12/12/14 1835 12/12/14 2047 12/13/14 0742 12/13/14 1133  GLUCAP 244* 338* 122* 147* 224* 364*     No results found for this or any previous visit (from the past 240 hour(s)).   Current facility-administered medications:  .  0.9 %  sodium chloride infusion, 100 mL, Intravenous, PRN, Munsoor Lateef, MD .  0.9 %  sodium chloride infusion, 100 mL, Intravenous, PRN, Munsoor Lateef, MD .  acetaminophen (TYLENOL) tablet 650 mg, 650 mg, Oral, Q6H PRN, 650 mg at 12/12/14 1839 **OR** acetaminophen (TYLENOL) suppository 650 mg, 650 mg, Rectal, Q6H PRN, Wyatt Haste, MD .  alteplase (CATHFLO ACTIVASE) injection 2 mg, 2 mg, Intracatheter, Once PRN, Munsoor Lateef, MD .  aspirin EC tablet 81 mg, 81 mg, Oral, Daily, Wyatt Haste, MD, 81 mg at 12/13/14 1047 .  atorvastatin (LIPITOR) tablet 80 mg, 80 mg, Oral, QHS, Wyatt Haste, MD, 80 mg at 12/12/14  2230 .  benzonatate (TESSALON) capsule 100 mg, 100 mg, Oral, TID PRN, Wyatt Haste, MD, 100 mg at 12/12/14 0954 .  budesonide (PULMICORT) nebulizer solution  0.25 mg, 0.25 mg, Nebulization, BID, Houston Siren, MD, 0.25 mg at 12/13/14 0807 .  calcium acetate (PHOSLO) capsule 667 mg, 667 mg, Oral, TID WC, Wyatt Haste, MD, 667 mg at 12/13/14 1254 .  cefTRIAXone (ROCEPHIN) 1 g in dextrose 5 % 50 mL IVPB, 1 g, Intravenous, Q24H, Houston Siren, MD, 1 g at 12/12/14 1840 .  clopidogrel (PLAVIX) tablet 75 mg, 75 mg, Oral, Daily, Wyatt Haste, MD, 75 mg at 12/13/14 1046 .  docusate sodium (COLACE) capsule 100 mg, 100 mg, Oral, QHS, Wyatt Haste, MD, 100 mg at 12/12/14 2230 .  epoetin alfa (EPOGEN,PROCRIT) injection 10,000 Units, 10,000 Units, Intravenous, Q M,W,F-HD, Munsoor Lateef, MD, 10,000 Units at 12/12/14 1730 .  famotidine (PEPCID) tablet 10 mg, 10 mg, Oral, Daily, Wyatt Haste, MD, 10 mg at 12/13/14 1047 .  feeding supplement (NEPRO CARB STEADY) liquid 237 mL, 237 mL, Oral, TID BM, Alford Highland, MD, 237 mL at 12/13/14 1115 .  furosemide (LASIX) injection 40 mg, 40 mg, Intravenous, Once, Wyatt Haste, MD, 40 mg at 12/08/14 0035 .  heparin injection 1,000 Units, 1,000 Units, Dialysis, PRN, Munsoor Lateef, MD .  heparin injection 5,000 Units, 5,000 Units, Subcutaneous, 3 times per day, Wyatt Haste, MD, 5,000 Units at 12/13/14 0600 .  insulin aspart (novoLOG) injection 0-5 Units, 0-5 Units, Subcutaneous, QHS, Wyatt Haste, MD, 3 Units at 12/11/14 2114 .  insulin aspart (novoLOG) injection 0-9 Units, 0-9 Units, Subcutaneous, TID WC, Wyatt Haste, MD, 9 Units at 12/13/14 1212 .  insulin glargine (LANTUS) injection 90 Units, 90 Units, Subcutaneous, Daily, Wyatt Haste, MD, 90 Units at 12/13/14 1047 .  ipratropium-albuterol (DUONEB) 0.5-2.5 (3) MG/3ML nebulizer solution 3 mL, 3 mL, Nebulization, Q4H, Houston Siren, MD, 3 mL at 12/13/14 1123 .  isosorbide mononitrate (IMDUR) 24 hr tablet 60  mg, 60 mg, Oral, Daily, Wyatt Haste, MD, 60 mg at 12/13/14 1046 .  levothyroxine (SYNTHROID, LEVOTHROID) tablet 75 mcg, 75 mcg, Oral, QAC breakfast, Wyatt Haste, MD, 75 mcg at 12/13/14 0747 .  lidocaine (PF) (XYLOCAINE) 1 % injection 5 mL, 5 mL, Intradermal, PRN, Munsoor Lateef, MD .  lidocaine-prilocaine (EMLA) cream 1 application, 1 application, Topical, PRN, Munsoor Lateef, MD .  loratadine (CLARITIN) tablet 10 mg, 10 mg, Oral, Daily, Wyatt Haste, MD, 10 mg at 12/13/14 1046 .  methylPREDNISolone sodium succinate (SOLU-MEDROL) 40 mg/mL injection 40 mg, 40 mg, Intravenous, 3 times per day, Houston Siren, MD, 40 mg at 12/13/14 0535 .  metoprolol tartrate (LOPRESSOR) tablet 12.5 mg, 12.5 mg, Oral, BID, Enedina Finner, MD, 12.5 mg at 12/13/14 1047 .  morphine 2 MG/ML injection 2 mg, 2 mg, Intravenous, Q4H PRN, Wyatt Haste, MD .  nitroGLYCERIN (NITROSTAT) SL tablet 0.4 mg, 0.4 mg, Sublingual, Q5 min PRN, Wyatt Haste, MD .  nystatin (MYCOSTATIN) 100000 UNIT/ML suspension 500,000 Units, 5 mL, Oral, QID, Alford Highland, MD, 500,000 Units at 12/13/14 1047 .  ondansetron (ZOFRAN) tablet 4 mg, 4 mg, Oral, Q6H PRN, 4 mg at 12/10/14 0514 **OR** ondansetron (ZOFRAN) injection 4 mg, 4 mg, Intravenous, Q6H PRN, Wyatt Haste, MD, 4 mg at 12/13/14 0747 .  oxyCODONE (Oxy IR/ROXICODONE) immediate release tablet 5 mg, 5 mg, Oral, Q4H PRN, Wyatt Haste, MD, 5 mg at 12/12/14 0951 .  pantoprazole (PROTONIX) EC tablet 40 mg, 40 mg, Oral, BID, Wyatt Haste, MD, 40 mg at 12/13/14 1046 .  pentafluoroprop-tetrafluoroeth (GEBAUERS) aerosol 1 application,  1 application, Topical, PRN, Munsoor Lateef, MD .  pregabalin (LYRICA) capsule 75 mg, 75 mg, Oral, QHS, Wyatt Haste, MD, 75 mg at 12/12/14 2231 .  rOPINIRole (REQUIP) tablet 2 mg, 2 mg, Oral, QHS, Wyatt Haste, MD, 2 mg at 12/12/14 2230 .  sodium chloride (OCEAN) 0.65 % nasal spray 1 spray, 1 spray, Each Nare, PRN, Wyatt Haste, MD .  sodium chloride 0.9 %  injection 3 mL, 3 mL, Intravenous, Q12H, Wyatt Haste, MD, 3 mL at 12/13/14 1050 .  traZODone (DESYREL) tablet 50 mg, 50 mg, Oral, QHS, Wyatt Haste, MD, 50 mg at 12/12/14 2231 .  venlafaxine XR (EFFEXOR-XR) 24 hr capsule 37.5 mg, 37.5 mg, Oral, QHS, Wyatt Haste, MD, 37.5 mg at 12/12/14 2231  IMAGING  Dg Chest 1 View  12/11/2014  CLINICAL DATA:  Shortness of breath. EXAM: CHEST 1 VIEW COMPARISON:  12/07/2014 . FINDINGS: Mediastinum and hilar structures normal. Cardiomegaly with mild pulmonary vascular prominence. No focal infiltrate. No pleural effusion or pneumothorax. Old left third rib fracture. IMPRESSION: Cardiomegaly with pulmonary venous congestion. No evidence of overt pulmonary edema or focal infiltrate . Electronically Signed   By: Maisie Fus  Register   On: 12/11/2014 07:30   Ct Chest Wo Contrast  12/11/2014  CLINICAL DATA:  Short of breath.  Wheezing. EXAM: CT CHEST WITHOUT CONTRAST TECHNIQUE: Multidetector CT imaging of the chest was performed following the standard protocol without IV contrast. COMPARISON:  Current chest radiographs and prior exams. FINDINGS: Neck base and axilla: No mass or adenopathy. Thyroid is unremarkable. Mediastinum and hila: Heart mildly enlarged. There are marked coronary artery calcifications. Great vessels are normal in caliber. Atherosclerotic calcifications are noted along the course of the aorta and branch vessels. No mediastinal or hilar masses or evidence of adenopathy. Lungs and pleura: Minimal right pleural effusion. There are irregular nodular opacities and reticulonodular opacities in both upper lobes and, to lesser degree, in the superior aspect of the lower lobes, right greater than left. Largest nodular type opacity is in the posterior right upper lobe, image 12, series 3, measuring 8.5 mm. There is some dependent atelectasis in both lower lobes. No evidence of pulmonary edema. No pneumothorax. Limited upper abdomen: Liver shows morphologic changes  consistent with cirrhosis. There is splenomegaly, with the spleen measuring 13.4 cm in greatest transverse dimension. No liver mass is seen on the included field of view. Dense atherosclerotic calcifications are noted along the visualized upper abdominal aorta and its branch vessels. Musculoskeletal: Bony structures are demineralized. There are degenerative changes throughout the visualized spine. No osteoblastic or osteolytic lesions. Chest wall: Unremarkable. IMPRESSION: 1. Irregular nodules and reticulonodular type opacities, mostly centrilobular in distribution, primarily affecting the upper lobes. Differential diagnosis includes infectious etiologies including tuberculosis and mycobacterium avium. Findings could be to the inflammation. Sarcoidosis can cause this pattern. 2. No evidence of pulmonary edema. 3. Mild cardiomegaly with dense coronary artery calcifications. Trace right pleural effusion. 4. Cirrhosis with findings of portal venous hypertension reflected by splenomegaly. Electronically Signed   By: Amie Portland M.D.   On: 12/11/2014 11:08     No results found.    Indwelling Urinary Catheter continued, requirement due to   Reason to continue Indwelling Urinary Catheter for strict Intake/Output monitoring for hemodynamic instability   Central Line continued, requirement due to   Reason to continue [employer] Monitoring of central venous pressure or other hemodynamic parameters   Ventilator continued, requirement due to, resp failure    Ventilator Sedation  RASS 0 to -2   Cultures: BCx2  UC  Sputum  Antibiotics:  Lines:   ASSESSMENT/PLAN  74 yo female with PMHx of HLD, DM, sCHF, tobacco exposure (2nd hand), seen in consultation for abnormal CT findings, dyspnea and cough  Dyspnea, cough, abnormal CT findings -CT findings with bilateral upper lobe reticulonodular, centrilobular opacities, most likely a infectious etiology probably a mild atypical pneumonia versus  bronchitis. Patient can have outpatient follow-up CT in 3 months. -Recommend to complete her current course of antibiotics which includes azithromycin and Rocephin, total antibiotic duration 7 days -Clinical picture of her respiratory status is more in line with a recent upper respiratory tract infection/bronchitis, for which she is being properly treated for -Recommend to start weaning steroids and converted to oral, start with prednisone 40 mg by mouth and wean over 2 weeks -Stop Pulmicort since patient is started to develop thrush -Incentive spirometer -Mucomyst with DuoNeb's for the next 3 days -Obtain sputum culture  ESRD - on HD - followed by Nephrology  DM - on SSI - follow glucose closely since on steroids.   Thank you for pulmonary consult, we will continue to follow along.   Stephanie Acre, MD [facility] Pager [phone] (please enter 7-digits) On Call Pager - [phone] (please enter 7-digits)     12/13/2014, 1:46 PM  Note: This note was prepared with Dragon dictation along with smaller phrase technology. Any transcriptional errors that result from this process are unintentional.

## 2014-12-13 NOTE — Progress Notes (Addendum)
Patient ID: Savannah ShawlLinda M Calarco, female   DOB: 02/18/1940, 74 y.o.   MRN: [id number] [facility]  PCP: Bobbye MortonSHARON A REILLY, MD  HPI/Subjective: Patient not feeling well at all. Still with cough and coughing fits. Some chest pain.  Objective: Filed Vitals:   12/13/14 0750 12/13/14 1135  BP: 104/62 98/58  Pulse:  51  Temp:  98.6 F (37 C)  Resp:  18    Filed Weights   12/12/14 1410 12/12/14 1815 12/13/14 0704  Weight: 88.3 kg (194 lb 10.7 oz) 85.2 kg (187 lb 13.3 oz) 85.367 kg (188 lb 3.2 oz)    ROS: Review of Systems  Constitutional: Negative for fever and chills.  Eyes: Negative for blurred vision.  Respiratory: Positive for cough and shortness of breath.   Cardiovascular: Positive for chest pain.  Gastrointestinal: Negative for nausea, vomiting, abdominal pain, diarrhea and constipation.  Genitourinary: Negative for dysuria.  Musculoskeletal: Negative for joint pain.  Neurological: Negative for dizziness and headaches.   Exam: Physical Exam  Constitutional: She is oriented to person, place, and time.  HENT:  Nose: No mucosal edema.  Mouth/Throat: No oropharyngeal exudate or posterior oropharyngeal edema.  Thrush in the back of the mouth.  Eyes: Conjunctivae, EOM and lids are normal. Pupils are equal, round, and reactive to light.  Neck: No JVD present. Carotid bruit is not present. No edema present. No thyroid mass and no thyromegaly present.  Cardiovascular: S1 normal and S2 normal.  Exam reveals no gallop.   No murmur heard. Pulses:      Dorsalis pedis pulses are 2+ on the right side, and 2+ on the left side.  Respiratory: No respiratory distress. She has decreased breath sounds in the right middle field, the right lower field, the left middle field and the left lower field. She has wheezes in the left middle field and the left lower field. She has no rhonchi. She has no rales.  GI: Soft. Bowel sounds are normal. There is no tenderness.   Musculoskeletal:       Right ankle: She exhibits swelling.       Left ankle: She exhibits swelling.  Lymphadenopathy:    She has no cervical adenopathy.  Neurological: She is alert and oriented to person, place, and time. No cranial nerve deficit.  Skin: Skin is warm. No rash noted. Nails show no clubbing.  Psychiatric: She has a normal mood and affect.    Data Reviewed: Basic Metabolic Panel:  Recent Labs Lab 12/07/14 1817 12/08/14 0428 12/09/14 1552 12/10/14 0403  NA 138 138 132* 138  K 3.8 4.6 4.7 4.3  CL 101 99* 97* 96*  CO2 28 29 26  33*  GLUCOSE 130* 273* 438* 285*  BUN 15 26* 52* 26*  CREATININE 2.09* 2.77* 3.60* 2.54*  CALCIUM 8.0* 8.0* 7.7* 8.0*  PHOS  --   --  4.0  4.0  --    Liver Function Tests:  Recent Labs Lab 12/09/14 1552  ALBUMIN 3.0*   CBC:  Recent Labs Lab 12/07/14 1817 12/09/14 1621 12/10/14 0403 12/11/14 0346 12/12/14 0514  WBC 7.7 7.3 6.7 6.2 7.9  HGB 9.4* 9.1* 9.3* 9.6* 10.3*  HCT 29.1* 28.6* 29.2* 30.9* 31.8*  MCV 84.2 86.1 86.8 86.4 85.8  PLT 89* 100* 93* 104* 123*   Cardiac Enzymes:  Recent Labs Lab 12/07/14 1817  TROPONINI 0.07*   BNP (last 3 results)  Recent Labs  10/01/14 0910 12/07/14 1817  BNP 1435.0* 1105.0*    CBG:  Recent Labs Lab 12/12/14 1132 12/12/14 1835 12/12/14 2047 12/13/14 0742 12/13/14 1133  GLUCAP 338* 122* 147* 224* 364*     Scheduled Meds: . acetylcysteine  2 mL Nebulization Q6H  . aspirin EC  81 mg Oral Daily  . atorvastatin  80 mg Oral QHS  . calcium acetate  667 mg Oral TID WC  . cefTRIAXone (ROCEPHIN)  IV  1 g Intravenous Q24H  . clopidogrel  75 mg Oral Daily  . docusate sodium  100 mg Oral QHS  . epoetin (EPOGEN/PROCRIT) injection  10,000 Units Intravenous Q M,W,F-HD  . famotidine  10 mg Oral Daily  . feeding supplement (NEPRO CARB STEADY)  237 mL Oral TID BM  . furosemide  40 mg Intravenous Once  . heparin  5,000 Units Subcutaneous 3 times per day  . insulin aspart  0-5  Units Subcutaneous QHS  . insulin aspart  0-9 Units Subcutaneous TID WC  . insulin glargine  90 Units Subcutaneous Daily  . ipratropium-albuterol  3 mL Nebulization Q6H  . isosorbide mononitrate  60 mg Oral Daily  . levothyroxine  75 mcg Oral QAC breakfast  . loratadine  10 mg Oral Daily  . methylPREDNISolone (SOLU-MEDROL) injection  40 mg Intravenous 3 times per day  . metoprolol tartrate  12.5 mg Oral BID  . nystatin  5 mL Oral QID  . pantoprazole  40 mg Oral BID  . pregabalin  75 mg Oral QHS  . rOPINIRole  2 mg Oral QHS  . sodium chloride  3 mL Intravenous Q12H  . traZODone  50 mg Oral QHS  . venlafaxine XR  37.5 mg Oral QHS    Assessment/Plan:  1. Asthmatic bronchitis- patient is on Rocephin and Zithromax and IV Solu-Medrol. Pulmicort and DuoNeb nebulizers. Appreciate pulmonary consultation for abnormal CT scan of the chest. They recommend following up a CT scan of the chest in 3 months after completion of antibiotic course. 2. Acute on chronic systolic congestive heart failure- the patient has been dialyzed to get rid of fluid. Since the patient is still short of breath I think it's likely a bronchitis rather than heart failure as the main issue. Patient is on metoprolol and Lasix 3. End-stage renal disease on hemodialysis- hemodialysis was done yesterday 4. Hypothyroidism unspecified continue levothyroxine 5. Gastroesophageal reflux disease without esophagitis continue Protonix 6. Cirrhosis seen on imaging of the liver 7. Type 2 diabetes with hyperglycemia secondary to steroids continue sliding scale insulin and Lantus insulin 8. Hyperlipidemia unspecified continue atorvastatin 9. Depression- no changes in psychiatric medications 10. Thrush nystatin swish and swallow  Code Status:     Code Status Orders        Start     Ordered   12/07/14 2212  Full code   Continuous     12/07/14 2211     Disposition Plan: Home when breathing better  Time spent: 25  minutes  Alford Highland  [facility]

## 2014-12-13 NOTE — Progress Notes (Signed)
[facility]  ROUNDING NOTE   Subjective:  Patient remained short of breath. This is despite extra dialysis treatment. She is currently resting in bed.   Objective:  Vital signs in last 24 hours:  Temp:  [97.4 F (36.3 C)-98.8 F (37.1 C)] 98.2 F (36.8 C) (11/24 0424) Pulse Rate:  [78-114] 89 (11/24 0531) Resp:  [15-26] 18 (11/24 0424) BP: (83-142)/(40-78) 104/62 mmHg (11/24 0750) SpO2:  [92 %-100 %] 98 % (11/24 0809) Weight:  [85.2 kg (187 lb 13.3 oz)-88.3 kg (194 lb 10.7 oz)] 85.367 kg (188 lb 3.2 oz) (11/24 0704)  Weight change: 0.5 kg (1 lb 1.6 oz) Filed Weights   12/12/14 1410 12/12/14 1815 12/13/14 0704  Weight: 88.3 kg (194 lb 10.7 oz) 85.2 kg (187 lb 13.3 oz) 85.367 kg (188 lb 3.2 oz)    Intake/Output: I/O last 3 completed shifts: In: 120 [P.O.:120] Out: 1972 [Urine:72; Other:1900]   Intake/Output this shift:  Total I/O In: 240 [P.O.:240] Out: -      Physical Exam: General: NAD, sitting up in bed  Head: Normocephalic, atraumatic. Moist oral mucosal membranes  Eyes: Anicteric  Neck: Supple, trachea midline  Lungs:  Bilateral rhonchi noted, basilar rales, wheezing noted.  Heart: S1S2 no rubs  Abdomen:  Soft, nontender, BS present   Extremities:  1+ peripheral edema.  Neurologic: Nonfocal, moving all four extremities  Skin: No lesions  Access: LUE AVF    Basic Metabolic Panel:  Recent Labs Lab 12/07/14 1817 12/08/14 0428 12/09/14 1552 12/10/14 0403  NA 138 138 132* 138  K 3.8 4.6 4.7 4.3  CL 101 99* 97* 96*  CO2 28 29 26  33*  GLUCOSE 130* 273* 438* 285*  BUN 15 26* 52* 26*  CREATININE 2.09* 2.77* 3.60* 2.54*  CALCIUM 8.0* 8.0* 7.7* 8.0*  PHOS  --   --  4.0  4.0  --     Liver Function Tests:  Recent Labs Lab 12/09/14 1552  ALBUMIN 3.0*   No results for input(s): LIPASE, AMYLASE in the last 168 hours. No results for input(s): AMMONIA in the last 168 hours.  CBC:  Recent Labs Lab 12/07/14 1817 12/09/14 1621  12/10/14 0403 12/11/14 0346 12/12/14 0514  WBC 7.7 7.3 6.7 6.2 7.9  HGB 9.4* 9.1* 9.3* 9.6* 10.3*  HCT 29.1* 28.6* 29.2* 30.9* 31.8*  MCV 84.2 86.1 86.8 86.4 85.8  PLT 89* 100* 93* 104* 123*    Cardiac Enzymes:  Recent Labs Lab 12/07/14 1817  TROPONINI 0.07*    BNP: Invalid input(s): POCBNP  CBG:  Recent Labs Lab 12/12/14 0740 12/12/14 1132 12/12/14 1835 12/12/14 2047 12/13/14 0742  GLUCAP 244* 338* 122* 147* 224*    Microbiology: Results for orders placed or performed during the hospital encounter of 10/01/14  Blood culture (routine x 2)     Status: None   Collection Time: 10/01/14  4:40 AM  Result Value Ref Range Status   Specimen Description BLOOD RIGHT THUMB  Final   Special Requests BOTTLES DRAWN AEROBIC AND ANAEROBIC 2ML  Final   Culture NO GROWTH 5 DAYS  Final   Report Status 10/06/2014 FINAL  Final  Blood culture (routine x 2)     Status: None   Collection Time: 10/01/14  4:40 AM  Result Value Ref Range Status   Specimen Description BLOOD RIGHT HAND  Final   Special Requests BOTTLES DRAWN AEROBIC AND ANAEROBIC 2ML  Final   Culture NO GROWTH 5 DAYS  Final   Report Status 10/06/2014 FINAL  Final  MRSA PCR Screening     Status: None   Collection Time: 10/01/14  8:47 AM  Result Value Ref Range Status   MRSA by PCR NEGATIVE NEGATIVE Final    Comment:        The GeneXpert MRSA Assay (FDA approved for NASAL specimens only), is one component of a comprehensive MRSA colonization surveillance program. It is not intended to diagnose MRSA infection nor to guide or monitor treatment for MRSA infections.     Coagulation Studies: No results for input(s): LABPROT, INR in the last 72 hours.  Urinalysis: No results for input(s): COLORURINE, LABSPEC, PHURINE, GLUCOSEU, HGBUR, BILIRUBINUR, KETONESUR, PROTEINUR, UROBILINOGEN, NITRITE, LEUKOCYTESUR in the last 72 hours.  Invalid input(s): APPERANCEUR    Imaging: Ct Chest Wo Contrast  12/11/2014   CLINICAL DATA:  Short of breath.  Wheezing. EXAM: CT CHEST WITHOUT CONTRAST TECHNIQUE: Multidetector CT imaging of the chest was performed following the standard protocol without IV contrast. COMPARISON:  Current chest radiographs and prior exams. FINDINGS: Neck base and axilla: No mass or adenopathy. Thyroid is unremarkable. Mediastinum and hila: Heart mildly enlarged. There are marked coronary artery calcifications. Great vessels are normal in caliber. Atherosclerotic calcifications are noted along the course of the aorta and branch vessels. No mediastinal or hilar masses or evidence of adenopathy. Lungs and pleura: Minimal right pleural effusion. There are irregular nodular opacities and reticulonodular opacities in both upper lobes and, to lesser degree, in the superior aspect of the lower lobes, right greater than left. Largest nodular type opacity is in the posterior right upper lobe, image 12, series 3, measuring 8.5 mm. There is some dependent atelectasis in both lower lobes. No evidence of pulmonary edema. No pneumothorax. Limited upper abdomen: Liver shows morphologic changes consistent with cirrhosis. There is splenomegaly, with the spleen measuring 13.4 cm in greatest transverse dimension. No liver mass is seen on the included field of view. Dense atherosclerotic calcifications are noted along the visualized upper abdominal aorta and its branch vessels. Musculoskeletal: Bony structures are demineralized. There are degenerative changes throughout the visualized spine. No osteoblastic or osteolytic lesions. Chest wall: Unremarkable. IMPRESSION: 1. Irregular nodules and reticulonodular type opacities, mostly centrilobular in distribution, primarily affecting the upper lobes. Differential diagnosis includes infectious etiologies including tuberculosis and mycobacterium avium. Findings could be to the inflammation. Sarcoidosis can cause this pattern. 2. No evidence of pulmonary edema. 3. Mild cardiomegaly  with dense coronary artery calcifications. Trace right pleural effusion. 4. Cirrhosis with findings of portal venous hypertension reflected by splenomegaly. Electronically Signed   By: Amie Portland M.D.   On: 12/11/2014 11:08     Medications:     . aspirin EC  81 mg Oral Daily  . atorvastatin  80 mg Oral QHS  . budesonide (PULMICORT) nebulizer solution  0.25 mg Nebulization BID  . calcium acetate  667 mg Oral TID WC  . cefTRIAXone (ROCEPHIN)  IV  1 g Intravenous Q24H  . clopidogrel  75 mg Oral Daily  . docusate sodium  100 mg Oral QHS  . epoetin (EPOGEN/PROCRIT) injection  10,000 Units Intravenous Q M,W,F-HD  . famotidine  10 mg Oral Daily  . feeding supplement (NEPRO CARB STEADY)  237 mL Oral TID BM  . furosemide  40 mg Intravenous Once  . heparin  5,000 Units Subcutaneous 3 times per day  . insulin aspart  0-5 Units Subcutaneous QHS  . insulin aspart  0-9 Units Subcutaneous TID WC  . insulin glargine  90 Units Subcutaneous Daily  .  ipratropium-albuterol  3 mL Nebulization Q4H  . isosorbide mononitrate  60 mg Oral Daily  . levothyroxine  75 mcg Oral QAC breakfast  . loratadine  10 mg Oral Daily  . methylPREDNISolone (SOLU-MEDROL) injection  40 mg Intravenous 3 times per day  . metoprolol tartrate  12.5 mg Oral BID  . nystatin  5 mL Oral QID  . pantoprazole  40 mg Oral BID  . pregabalin  75 mg Oral QHS  . rOPINIRole  2 mg Oral QHS  . sodium chloride  3 mL Intravenous Q12H  . traZODone  50 mg Oral QHS  . venlafaxine XR  37.5 mg Oral QHS   sodium chloride, sodium chloride, acetaminophen **OR** acetaminophen, alteplase, benzonatate, heparin, lidocaine (PF), lidocaine-prilocaine, morphine injection, nitroGLYCERIN, ondansetron **OR** ondansetron (ZOFRAN) IV, oxyCODONE, pentafluoroprop-tetrafluoroeth, sodium chloride  Assessment/ Plan:  74 y.o. female with end-stage renal disease, carotid stenosis, coronary artery disease, diabetes type 2, history of myocardial infarction,  hypertension, peripheral vascular disease  1.  ESRD on HD MWF:   - Patient had dialysis yesterday. No acute indication for dialysis today. She had extra dialysis treatment this week. Despite this should remain short of breath. Her shortness of breath is likely pulmonary in nature.  2.  Anemia of CKD:  Hemoglobin currently 10.3. Continue Epogen with dialysis.  3.  SHPTH:  - Most recent phosphorus 4.0. Continue to monitor phosphorus.  4.  Shortness of breath/pulmonary edema/COPD:   -  We have treated the patient's shortness of breath with extra dialysis. Despite this she remained short of breath. Pulmonary edema may have been contributed and previously however she likely has additional lung issues underlying her shortness of breath. Continue supportive care as tolerated.    LOS: 6 Khadeem Rockett 11/24/201610:08 AM

## 2014-12-14 LAB — GLUCOSE, CAPILLARY
GLUCOSE-CAPILLARY: 363 mg/dL — AB (ref 65–99)
GLUCOSE-CAPILLARY: 157 mg/dL — AB (ref 65–99)
Glucose-Capillary: 314 mg/dL — ABNORMAL HIGH (ref 65–99)

## 2014-12-14 MED ORDER — INSULIN GLARGINE 100 UNIT/ML ~~LOC~~ SOLN
110.0000 [IU] | Freq: Every day | SUBCUTANEOUS | Status: DC
  Filled 2014-12-14: qty 1.1

## 2014-12-14 MED ORDER — BISACODYL 5 MG PO TBEC: 5.0000 mg | DELAYED_RELEASE_TABLET | Freq: Every day | ORAL | Status: DC | PRN

## 2014-12-14 NOTE — Progress Notes (Signed)
HD tx completed.

## 2014-12-14 NOTE — Progress Notes (Signed)
Physical Therapy Treatment Patient Details Name: Savannah Key MRN: [id number] DOB: 1940/06/26 Today's Date: 12/14/2014    History of Present Illness presented to ER secondary to cough, SOB; admitted with acute/chronic respiratory failure secondary to CHF and bronchitis.      PT Comments    Pt demonstrating improved activity tolerance walking in hallway, pausing periodically to recover from effort. Pt cough remains intermittent, productive and strong, causing mild HA and CP at this time. Pt demonstrating ability to walk safe household distances with good awareness of limitation and safe use of RW. Pt still most limited by DOE and poor activity tolerance in legs. Pt will continue to benefit from skilled intervention to restore to PLOF in ambulation, ADL, and IADL, to support safe return to home at [place].   Follow Up Recommendations  Home health PT     Equipment Recommendations  None recommended by PT    Recommendations for Other Services       Precautions / Restrictions Precautions Precautions: Fall Precaution Comments: No BP L UE Restrictions Weight Bearing Restrictions: No    Mobility  Bed Mobility Overal bed mobility: Modified Independent                Transfers Overall transfer level: Needs assistance Equipment used: Rolling walker (2 wheeled) Transfers: Sit to/from Stand (performed 6x.) Sit to Stand: Min guard            Ambulation/Gait   Ambulation Distance (Feet): 225 Feet Assistive device: Rolling walker (2 wheeled)       General Gait Details: slow, symetrical, step-to gait, bilat, stopping to recover from feeling tired halfway. Pt declined additional distance as she reports fatigue after having recently returned from HD. SaO2 remains WNL, HR consistently in 120's with ambulation.    Stairs            Wheelchair Mobility    Modified Rankin (Stroke Patients Only)       Balance Overall balance assessment: No apparent balance deficits  (not formally assessed)           Standing balance-Leahy Scale: Fair                      Cognition Arousal/Alertness: Awake/alert Behavior During Therapy: WFL for tasks assessed/performed Overall Cognitive Status: Within Functional Limits for tasks assessed                      Exercises      General Comments        Pertinent Vitals/Pain Pain Assessment: Faces Faces Pain Scale: Hurts little more Pain Location: Reporting some CP and HA, pt reporting from chronic coughing.    Home Living                      Prior Function            PT Goals (current goals can now be found in the care plan section) Acute Rehab PT Goals Patient Stated Goal: "to return home" PT Goal Formulation: With patient Time For Goal Achievement: 12/24/14 Potential to Achieve Goals: Good Progress towards PT goals: Progressing toward goals    Frequency  Min 2X/week    PT Plan Current plan remains appropriate    Co-evaluation             End of Session Equipment Utilized During Treatment: Gait belt Activity Tolerance: Patient tolerated treatment well;Patient limited by fatigue Patient left: in bed;with call bell/phone within  reach;with bed alarm set     Time: [id number] PT Time Calculation (min) (ACUTE ONLY): 14 min  Charges:  $Therapeutic Activity: 8-22 mins                    G Codes:      Buccola,Allan C 12/14/2014, 3:20 PM 3:22 PM  Rosamaria LintsAllan C Buccola, PT, DPT [place] License # [id number]

## 2014-12-14 NOTE — Progress Notes (Signed)
Patient ID: Savannah Key, female   DOB: [birth year], 74 y.o.   MRN: [id number] [facility] PROGRESS NOTE  PCP: Bobbye Morton, MD  HPI/Subjective: Patient feeling only slightly better. Not coughing as much. Still nonproductive with the cough. Still with shortness of breath and wheezing.  Objective: Filed Vitals:   12/14/14 1401 12/14/14 1513  BP: 102/53   Pulse: 90 125  Temp: 98 F (36.7 C)   Resp: 20     Filed Weights   12/14/14 0930 12/14/14 1319 12/14/14 1401  Weight: 88.3 kg (194 lb 10.7 oz) 86.3 kg (190 lb 4.1 oz) 84.732 kg (186 lb 12.8 oz)    ROS: Review of Systems  Constitutional: Negative for fever and chills.  Eyes: Negative for blurred vision.  Respiratory: Positive for cough, shortness of breath and wheezing.   Cardiovascular: Positive for chest pain.  Gastrointestinal: Negative for nausea, vomiting, abdominal pain, diarrhea and constipation.  Genitourinary: Negative for dysuria.  Musculoskeletal: Negative for joint pain.  Neurological: Negative for dizziness and headaches.   Exam: Physical Exam  Constitutional: She is oriented to person, place, and time.  HENT:  Nose: No mucosal edema.  Mouth/Throat: No oropharyngeal exudate or posterior oropharyngeal edema.  Thrush in the back of the mouth.  Eyes: Conjunctivae, EOM and lids are normal. Pupils are equal, round, and reactive to light.  Neck: No JVD present. Carotid bruit is not present. No edema present. No thyroid mass and no thyromegaly present.  Cardiovascular: S1 normal and S2 normal.  Exam reveals no gallop.   No murmur heard. Pulses:      Dorsalis pedis pulses are 2+ on the right side, and 2+ on the left side.  Respiratory: No respiratory distress. She has decreased breath sounds in the right middle field, the right lower field, the left middle field and the left lower field. She has wheezes in the left middle field and the left lower field. She has no rhonchi. She has no rales.  GI:  Soft. Bowel sounds are normal. There is no tenderness.  Musculoskeletal:       Right ankle: She exhibits swelling.       Left ankle: She exhibits swelling.  Lymphadenopathy:    She has no cervical adenopathy.  Neurological: She is alert and oriented to person, place, and time. No cranial nerve deficit.  Skin: Skin is warm. No rash noted. Nails show no clubbing.  Psychiatric: She has a normal mood and affect.    Data Reviewed: Basic Metabolic Panel:  Recent Labs Lab 12/07/14 1817 12/08/14 0428 12/09/14 1552 12/10/14 0403  NA 138 138 132* 138  K 3.8 4.6 4.7 4.3  CL 101 99* 97* 96*  CO2 33*  GLUCOSE 130* 273* 438* 285*  BUN 15 26* 52* 26*  CREATININE 2.09* 2.77* 3.60* 2.54*  CALCIUM 8.0* 8.0* 7.7* 8.0*  PHOS  --   --  4.0  4.0  --    Liver Function Tests:  Recent Labs Lab 12/09/14 1552  ALBUMIN 3.0*   CBC:  Recent Labs Lab 12/07/14 1817 12/09/14 1621 12/10/14 0403 12/11/14 0346 12/12/14 0514  WBC 7.7 7.3 6.7 6.2 7.9  HGB 9.4* 9.1* 9.3* 9.6* 10.3*  HCT 29.1* 28.6* 29.2* 30.9* 31.8*  MCV 84.2 86.1 86.8 86.4 85.8  PLT 89* 100* 93* 104* 123*   Cardiac Enzymes:  Recent Labs Lab 12/07/14 1817  TROPONINI 0.07*   BNP (last 3 results)  Recent Labs  10/01/14 0910 12/07/14 1817  BNP  1435.0* 1105.0*    CBG:  Recent Labs Lab 12/12/14 2047 12/13/14 0742 12/13/14 1133 12/13/14 1634 12/14/14 0739  GLUCAP 147* 224* 364* 381* 363*     Scheduled Meds: . acetylcysteine  2 mL Nebulization Q6H  . aspirin EC  81 mg Oral Daily  . atorvastatin  80 mg Oral QHS  . calcium acetate  667 mg Oral TID WC  . cefTRIAXone (ROCEPHIN)  IV  1 g Intravenous Q24H  . clopidogrel  75 mg Oral Daily  . docusate sodium  100 mg Oral QHS  . epoetin (EPOGEN/PROCRIT) injection  10,000 Units Intravenous Q M,W,F-HD  . famotidine  10 mg Oral Daily  . feeding supplement (NEPRO CARB STEADY)  237 mL Oral TID BM  . furosemide  40 mg Intravenous Once  . heparin  5,000 Units  Subcutaneous 3 times per day  . insulin aspart  0-5 Units Subcutaneous QHS  . insulin aspart  0-9 Units Subcutaneous TID WC  . [START ON 12/15/2014] insulin glargine  110 Units Subcutaneous Daily  . ipratropium-albuterol  3 mL Nebulization Q6H  . isosorbide mononitrate  60 mg Oral Daily  . levothyroxine  75 mcg Oral QAC breakfast  . loratadine  10 mg Oral Daily  . methylPREDNISolone (SOLU-MEDROL) injection  40 mg Intravenous 3 times per day  . metoprolol tartrate  12.5 mg Oral BID  . nystatin  5 mL Oral QID  . pantoprazole  40 mg Oral BID  . pregabalin  75 mg Oral QHS  . rOPINIRole  2 mg Oral QHS  . sodium chloride  3 mL Intravenous Q12H  . traZODone  50 mg Oral QHS  . venlafaxine XR  37.5 mg Oral QHS    Assessment/Plan:  1. Asthmatic bronchitis- patient is on Rocephin and Zithromax was completed and IV Solu-Medrol. Pulmicort and DuoNeb nebulizers. Appreciate pulmonary consultation for abnormal CT scan of the chest. They recommend following up a CT scan of the chest in 3 months after completion of antibiotic course. 2. Acute on chronic systolic congestive heart failure- the patient has been dialyzed to get rid of fluid. Since the patient is still short of breath I think it's likely a bronchitis rather than heart failure as the main issue. Patient is on metoprolol and Lasix 3. End-stage renal disease on hemodialysis- hemodialysis was done yesterday 4. Hypothyroidism unspecified continue levothyroxine 5. Gastroesophageal reflux disease without esophagitis continue Protonix 6. Cirrhosis seen on imaging of the liver 7. Type 2 diabetes with hyperglycemia secondary to steroids continue sliding scale insulin and Lantus insulin increased to 110 units.  8. Hyperlipidemia unspecified continue atorvastatin 9. Depression- no changes in psychiatric medications 10. Thrush nystatin swish and swallow 11. Constipation- Dulcolax as needed  Code Status:     Code Status Orders        Start      Ordered   12/07/14 2212  Full code   Continuous     12/07/14 2211     Disposition Plan: Home when breathing better  Time spent: 20 minutes  Alford HighlandWIETING, Corliss Lamartina  [facility]

## 2014-12-14 NOTE — Progress Notes (Signed)
Pre-hd tx 

## 2014-12-14 NOTE — Progress Notes (Signed)
Nutrition Follow-up    INTERVENTION:   Meals and Snacks: Cater to patient preferences Medical Food Supplement Therapy: continue Nepro TID between meals per MD order  NUTRITION DIAGNOSIS:   No nutrition diagnosis at this time  GOAL:   Patient will meet greater than or equal to 90% of their needs  MONITOR:    (Energy Intake, Anthropometrics, Digestive System, Electrolyte/Renal Profile)  REASON FOR ASSESSMENT:   Diagnosis    ASSESSMENT:    Pt down for dialysis on visit today, pt continues with cough   Diet Order:  Diet renal with fluid restriction Fluid restriction:: 1200 mL Fluid; Room service appropriate?: Yes; Fluid consistency:: Thin   Energy Intake: recorded po intake 83% of meals on average, noted pt also receiving Nepro TID between meals  Electrolyte and Renal Profile:  Recent Labs Lab 12/08/14 0428 12/09/14 1552 12/10/14 0403  BUN 26* 52* 26*  CREATININE 2.77* 3.60* 2.54*  NA 138 132* 138  K 4.6 4.7 4.3  PHOS  --  4.0  4.0  --    Glucose Profile:  Recent Labs  12/13/14 1133 12/13/14 1634 12/14/14 0739  GLUCAP 364* 381* 363*       Digestive System: +thrush  Meds: lantus, lasix, phoslo, solumedrol  Height:   Ht Readings from Last 1 Encounters:  12/07/14 5' (1.524 m)    Weight:   Wt Readings from Last 1 Encounters:  12/14/14 190 lb 4.1 oz (86.3 kg)   Filed Weights   12/14/14 0550 12/14/14 0930 12/14/14 1319  Weight: 191 lb 1.6 oz (86.682 kg) 194 lb 10.7 oz (88.3 kg) 190 lb 4.1 oz (86.3 kg)    BMI:  Body mass index is 37.16 kg/(m^2).  LOW Care Level  Romelle Starcherate Keddrick Wyne MS, [place], LDN [phone] Pager

## 2014-12-14 NOTE — Progress Notes (Signed)
Post hd tx 

## 2014-12-14 NOTE — Care Management (Addendum)
Patient is off the floor.  PT recommending home health PT.  On Call [facility] nurse notified of PT recommendation

## 2014-12-14 NOTE — Care Management Important Message (Signed)
Important Message  Patient Details  Name: Savannah ShawlLinda M Key MRN: [id number] Date of Birth: 10/11/1940   Medicare Important Message Given:  Yes    Chapman FitchBOWEN, Izek Corvino T, RN 12/14/2014, 9:02 AM

## 2014-12-14 NOTE — Progress Notes (Signed)
Pt had busy day today. Went for dialysis after breakfast. Tolerated well. Vss. Up with p.t and ambulated in hallway. tol well. Taking meals well. Med once for generalized pain and also for nausea with good relief. Watching tv and talking on phone. Tele remains in a fib with some ectopy.

## 2014-12-14 NOTE — Progress Notes (Signed)
[facility]  ROUNDING NOTE   Subjective:  Patient seen and evaluated during dialysis. Seems to be tolerating well. Still has shortness of breath and cough.  Objective:  Vital signs in last 24 hours:  Temp:  [97.8 F (36.6 C)-98.6 F (37 C)] 98.2 F (36.8 C) (11/25 0930) Pulse Rate:  [51-103] 98 (11/25 0945) Resp:  [12-22] 22 (11/25 0945) BP: (95-126)/(51-68) 107/57 mmHg (11/25 0945) SpO2:  [89 %-98 %] 97 % (11/25 0945) Weight:  [86.682 kg (191 lb 1.6 oz)-88.3 kg (194 lb 10.7 oz)] 88.3 kg (194 lb 10.7 oz) (11/25 0930)  Weight change: -2.933 kg (-6 lb 7.5 oz) Filed Weights   12/13/14 0704 12/14/14 0550 12/14/14 0930  Weight: 85.367 kg (188 lb 3.2 oz) 86.682 kg (191 lb 1.6 oz) 88.3 kg (194 lb 10.7 oz)    Intake/Output: I/O last 3 completed shifts: In: 650 [P.O.:600; IV Piggyback:50] Out: 272 [Urine:272]   Intake/Output this shift:        Physical Exam: General: NAD, sitting up in bed  Head: Normocephalic, atraumatic. Moist oral mucosal membranes  Eyes: Anicteric  Neck: Supple, trachea midline  Lungs:  Bilateral rhonchi noted, normal effort   Heart: S1S2 no rubs  Abdomen:  Soft, nontender, BS present   Extremities:  1+ peripheral edema.  Neurologic: Nonfocal, moving all four extremities  Skin: No lesions  Access: LUE AVF    Basic Metabolic Panel:  Recent Labs Lab 12/07/14 1817 12/08/14 0428 12/09/14 1552 12/10/14 0403  NA 138 138 132* 138  K 3.8 4.6 4.7 4.3  CL 101 99* 97* 96*  CO2 33*  GLUCOSE 130* 273* 438* 285*  BUN 15 26* 52* 26*  CREATININE 2.09* 2.77* 3.60* 2.54*  CALCIUM 8.0* 8.0* 7.7* 8.0*  PHOS  --   --  4.0  4.0  --     Liver Function Tests:  Recent Labs Lab 12/09/14 1552  ALBUMIN 3.0*   No results for input(s): LIPASE, AMYLASE in the last 168 hours. No results for input(s): AMMONIA in the last 168 hours.  CBC:  Recent Labs Lab 12/07/14 1817 12/09/14 1621 12/10/14 0403 12/11/14 0346 12/12/14 0514   WBC 7.7 7.3 6.7 6.2 7.9  HGB 9.4* 9.1* 9.3* 9.6* 10.3*  HCT 29.1* 28.6* 29.2* 30.9* 31.8*  MCV 84.2 86.1 86.8 86.4 85.8  PLT 89* 100* 93* 104* 123*    Cardiac Enzymes:  Recent Labs Lab 12/07/14 1817  TROPONINI 0.07*    BNP: Invalid input(s): POCBNP  CBG:  Recent Labs Lab 12/12/14 2047 12/13/14 0742 12/13/14 1133 12/13/14 1634 12/14/14 0739  GLUCAP 147* 224* 364* 381* 363*    Microbiology: Results for orders placed or performed during the hospital encounter of 10/01/14  Blood culture (routine x 2)     Status: None   Collection Time: 10/01/14  4:40 AM  Result Value Ref Range Status   Specimen Description BLOOD RIGHT THUMB  Final   Special Requests BOTTLES DRAWN AEROBIC AND ANAEROBIC  Final   Culture NO GROWTH 5 DAYS  Final   Report Status 10/06/2014 FINAL  Final  Blood culture (routine x 2)     Status: None   Collection Time: 10/01/14  4:40 AM  Result Value Ref Range Status   Specimen Description BLOOD RIGHT HAND  Final   Special Requests BOTTLES DRAWN AEROBIC AND ANAEROBIC  Final   Culture NO GROWTH 5 DAYS  Final   Report Status 10/06/2014 FINAL  Final  MRSA PCR Screening  Status: None   Collection Time: 10/01/14  8:47 AM  Result Value Ref Range Status   MRSA by PCR NEGATIVE NEGATIVE Final    Comment:        The GeneXpert MRSA Assay (FDA approved for NASAL specimens only), is one component of a comprehensive MRSA colonization surveillance program. It is not intended to diagnose MRSA infection nor to guide or monitor treatment for MRSA infections.     Coagulation Studies: No results for input(s): LABPROT, INR in the last 72 hours.  Urinalysis: No results for input(s): COLORURINE, LABSPEC, PHURINE, GLUCOSEU, HGBUR, BILIRUBINUR, KETONESUR, PROTEINUR, UROBILINOGEN, NITRITE, LEUKOCYTESUR in the last 72 hours.  Invalid input(s): APPERANCEUR    Imaging: No results found.   Medications:     . acetylcysteine  2 mL Nebulization Q6H  .  aspirin EC  81 mg Oral Daily  . atorvastatin  80 mg Oral QHS  . calcium acetate  667 mg Oral TID WC  . cefTRIAXone (ROCEPHIN)  IV  1 g Intravenous Q24H  . clopidogrel  75 mg Oral Daily  . docusate sodium  100 mg Oral QHS  . epoetin (EPOGEN/PROCRIT) injection  10,000 Units Intravenous Q M,W,F-HD  . famotidine  10 mg Oral Daily  . feeding supplement (NEPRO CARB STEADY)  237 mL Oral TID BM  . furosemide  40 mg Intravenous Once  . heparin  5,000 Units Subcutaneous 3 times per day  . insulin aspart  0-5 Units Subcutaneous QHS  . insulin aspart  0-9 Units Subcutaneous TID WC  . insulin glargine  90 Units Subcutaneous Daily  . ipratropium-albuterol  3 mL Nebulization Q6H  . isosorbide mononitrate  60 mg Oral Daily  . levothyroxine  75 mcg Oral QAC breakfast  . loratadine  10 mg Oral Daily  . methylPREDNISolone (SOLU-MEDROL) injection  40 mg Intravenous 3 times per day  . metoprolol tartrate  12.5 mg Oral BID  . nystatin  5 mL Oral QID  . pantoprazole  40 mg Oral BID  . pregabalin  75 mg Oral QHS  . rOPINIRole  2 mg Oral QHS  . sodium chloride  3 mL Intravenous Q12H  . traZODone  50 mg Oral QHS  . venlafaxine XR  37.5 mg Oral QHS   sodium chloride, sodium chloride, acetaminophen **OR** acetaminophen, alteplase, benzonatate, heparin, ipratropium-albuterol, lidocaine (PF), lidocaine-prilocaine, morphine injection, nitroGLYCERIN, ondansetron **OR** ondansetron (ZOFRAN) IV, oxyCODONE, pentafluoroprop-tetrafluoroeth, sodium chloride  Assessment/ Plan:  74 y.o. female with end-stage renal disease, carotid stenosis, coronary artery disease, diabetes type 2, history of myocardial infarction, hypertension, peripheral vascular disease  1.  ESRD on HD MWF:   - Patient seen and evaluated during dialysis today. Seems to be tolerating well. Continue dialysis on Monday, Wednesday, Friday schedule.  2.  Anemia of CKD:  Patient due for Epogen with dialysis today.  3.  SHPTH:  - Phosphorous under good  control. Continue PhosLo.  4.  Shortness of breath/pulmonary edema/COPD:   -  Shortness of breath appears to be multifactorial as before. She still has some shortness of breath. CT chest was also reviewed. We have treated any component of underlying pulmonary edema.   LOS: 7 Savannah Key 11/25/201610:22 AM

## 2014-12-14 NOTE — Progress Notes (Signed)
HD tx start 

## 2014-12-15 DIAGNOSIS — R918 Other nonspecific abnormal finding of lung field: Secondary | ICD-10-CM

## 2014-12-15 DIAGNOSIS — R0600 Dyspnea, unspecified: Secondary | ICD-10-CM

## 2014-12-15 DIAGNOSIS — R5381 Other malaise: Secondary | ICD-10-CM

## 2014-12-15 DIAGNOSIS — J209 Acute bronchitis, unspecified: Secondary | ICD-10-CM

## 2014-12-15 LAB — CBC WITH DIFFERENTIAL/PLATELET
Basophils Absolute: 0 10*3/uL (ref 0–0.1)
EOS ABS: 0 10*3/uL (ref 0–0.7)
HCT: 30.9 % — ABNORMAL LOW (ref 35.0–47.0)
Hemoglobin: 9.6 g/dL — ABNORMAL LOW (ref 12.0–16.0)
LYMPHS ABS: 0.3 10*3/uL — AB (ref 1.0–3.6)
Lymphocytes Relative: 4 %
MCH: 26.4 pg (ref 26.0–34.0)
MCHC: 31.1 g/dL — AB (ref 32.0–36.0)
MCV: 84.7 fL (ref 80.0–100.0)
MONO ABS: 0.4 10*3/uL (ref 0.2–0.9)
Neutro Abs: 6.2 10*3/uL (ref 1.4–6.5)
PLATELETS: 99 10*3/uL — AB (ref 150–440)
RBC: 3.65 MIL/uL — ABNORMAL LOW (ref 3.80–5.20)
RDW: 21.3 % — AB (ref 11.5–14.5)
WBC: 6.9 10*3/uL (ref 3.6–11.0)

## 2014-12-15 LAB — CBC
HCT: 31.4 % — ABNORMAL LOW (ref 35.0–47.0)
HEMOGLOBIN: 9.8 g/dL — AB (ref 12.0–16.0)
MCH: 26.4 pg (ref 26.0–34.0)
MCHC: 31.2 g/dL — AB (ref 32.0–36.0)
MCV: 84.6 fL (ref 80.0–100.0)
PLATELETS: 94 10*3/uL — AB (ref 150–440)
RBC: 3.71 MIL/uL — ABNORMAL LOW (ref 3.80–5.20)
RDW: 22.2 % — AB (ref 11.5–14.5)
WBC: 6 10*3/uL (ref 3.6–11.0)

## 2014-12-15 LAB — GLUCOSE, CAPILLARY
GLUCOSE-CAPILLARY: 196 mg/dL — AB (ref 65–99)
GLUCOSE-CAPILLARY: 294 mg/dL — AB (ref 65–99)
Glucose-Capillary: 347 mg/dL — ABNORMAL HIGH (ref 65–99)
Glucose-Capillary: 314 mg/dL — ABNORMAL HIGH (ref 65–99)

## 2014-12-15 LAB — BASIC METABOLIC PANEL
Anion gap: 12 (ref 5–15)
BUN: 42 mg/dL — AB (ref 6–20)
CALCIUM: 8.1 mg/dL — AB (ref 8.9–10.3)
CO2: 28 mmol/L (ref 22–32)
CREATININE: 3.56 mg/dL — AB (ref 0.44–1.00)
Chloride: 93 mmol/L — ABNORMAL LOW (ref 101–111)
GFR calc non Af Amer: 12 mL/min — ABNORMAL LOW (ref >60)
GFR, EST AFRICAN AMERICAN: 14 mL/min — AB (ref >60)
Glucose, Bld: 321 mg/dL — ABNORMAL HIGH (ref 65–99)
Potassium: 4.3 mmol/L (ref 3.5–5.1)
SODIUM: 133 mmol/L — AB (ref 135–145)

## 2014-12-15 MED ORDER — INSULIN GLARGINE 100 UNIT/ML ~~LOC~~ SOLN
55.0000 [IU] | Freq: Every day | SUBCUTANEOUS | Status: DC
  Administered 2014-12-15 – 2014-12-16 (×2): 55 [IU] via SUBCUTANEOUS
  Filled 2014-12-15 (×2): qty 0.55

## 2014-12-15 NOTE — Progress Notes (Signed)
[facility]  ROUNDING NOTE   Subjective:  Patient had dialysis yesterday. Tolerated well. Still having some shortness of breath however. Weight currently 85.6 kg.  Objective:  Vital signs in last 24 hours:  Temp:  [97.6 F (36.4 C)-98.1 F (36.7 C)] 97.9 F (36.6 C) (11/26 0511) Pulse Rate:  [78-125] 109 (11/26 1109) Resp:  [14-22] 16 (11/26 1109) BP: (96-121)/(53-96) 98/56 mmHg (11/26 1113) SpO2:  [94 %-100 %] 94 % (11/26 1109) Weight:  [84.732 kg (186 lb 12.8 oz)-86.3 kg (190 lb 4.1 oz)] 85.639 kg (188 lb 12.8 oz) (11/26 0511)  Weight change: 2.933 kg (6 lb 7.5 oz) Filed Weights   12/14/14 1319 12/14/14 1401 12/15/14 0511  Weight: 86.3 kg (190 lb 4.1 oz) 84.732 kg (186 lb 12.8 oz) 85.639 kg (188 lb 12.8 oz)    Intake/Output: I/O last 3 completed shifts: In: -  Out: 2250 [Urine:250; Other:2000]   Intake/Output this shift:        Physical Exam: General: NAD, sitting up in bed  Head: Normocephalic, atraumatic. Moist oral mucosal membranes  Eyes: Anicteric  Neck: Supple, trachea midline  Lungs:  Bilateral rhonchi noted, normal effort   Heart: S1S2 no rubs  Abdomen:  Soft, nontender, BS present   Extremities:  1+ peripheral edema.  Neurologic: Nonfocal, moving all four extremities  Skin: No lesions  Access: LUE AVF    Basic Metabolic Panel:  Recent Labs Lab 12/09/14 1552 12/10/14 0403 12/15/14 1052  NA 132* 138 133*  K 4.7 4.3 4.3  CL 97* 96* 93*  CO2 26 33* 28  GLUCOSE 438* 285* 321*  BUN 52* 26* 42*  CREATININE 3.60* 2.54* 3.56*  CALCIUM 7.7* 8.0* 8.1*  PHOS 4.0  4.0  --   --     Liver Function Tests:  Recent Labs Lab 12/09/14 1552  ALBUMIN 3.0*   No results for input(s): LIPASE, AMYLASE in the last 168 hours. No results for input(s): AMMONIA in the last 168 hours.  CBC:  Recent Labs Lab 12/10/14 0403 12/11/14 0346 12/12/14 0514 12/15/14 0457 12/15/14 1052  WBC 6.7 6.2 7.9 6.0 6.9  NEUTROABS  --   --   --   --  6.2   HGB 9.3* 9.6* 10.3* 9.8* 9.6*  HCT 29.2* 30.9* 31.8* 31.4* 30.9*  MCV 86.8 86.4 85.8 84.6 84.7  PLT 93* 104* 123* 94* 99*    Cardiac Enzymes: No results for input(s): CKTOTAL, CKMB, CKMBINDEX, TROPONINI in the last 168 hours.  BNP: Invalid input(s): POCBNP  CBG:  Recent Labs Lab 12/14/14 0739 12/14/14 1629 12/14/14 2130 12/15/14 0741 12/15/14 1112  GLUCAP 363* 314* 157* 196* 294*    Microbiology: Results for orders placed or performed during the hospital encounter of 10/01/14  Blood culture (routine x 2)     Status: None   Collection Time: 10/01/14  4:40 AM  Result Value Ref Range Status   Specimen Description BLOOD RIGHT THUMB  Final   Special Requests BOTTLES DRAWN AEROBIC AND ANAEROBIC  Final   Culture NO GROWTH 5 DAYS  Final   Report Status 10/06/2014 FINAL  Final  Blood culture (routine x 2)     Status: None   Collection Time: 10/01/14  4:40 AM  Result Value Ref Range Status   Specimen Description BLOOD RIGHT HAND  Final   Special Requests BOTTLES DRAWN AEROBIC AND ANAEROBIC  Final   Culture NO GROWTH 5 DAYS  Final   Report Status 10/06/2014 FINAL  Final  MRSA PCR  Screening     Status: None   Collection Time: 10/01/14  8:47 AM  Result Value Ref Range Status   MRSA by PCR NEGATIVE NEGATIVE Final    Comment:        The GeneXpert MRSA Assay (FDA approved for NASAL specimens only), is one component of a comprehensive MRSA colonization surveillance program. It is not intended to diagnose MRSA infection nor to guide or monitor treatment for MRSA infections.     Coagulation Studies: No results for input(s): LABPROT, INR in the last 72 hours.  Urinalysis: No results for input(s): COLORURINE, LABSPEC, PHURINE, GLUCOSEU, HGBUR, BILIRUBINUR, KETONESUR, PROTEINUR, UROBILINOGEN, NITRITE, LEUKOCYTESUR in the last 72 hours.  Invalid input(s): APPERANCEUR    Imaging: No results found.   Medications:     . acetylcysteine  2 mL Nebulization Q6H   . aspirin EC  81 mg Oral Daily  . atorvastatin  80 mg Oral QHS  . calcium acetate  667 mg Oral TID WC  . cefTRIAXone (ROCEPHIN)  IV  1 g Intravenous Q24H  . clopidogrel  75 mg Oral Daily  . docusate sodium  100 mg Oral QHS  . epoetin (EPOGEN/PROCRIT) injection  10,000 Units Intravenous Q M,W,F-HD  . famotidine  10 mg Oral Daily  . feeding supplement (NEPRO CARB STEADY)  237 mL Oral TID BM  . furosemide  40 mg Intravenous Once  . heparin  5,000 Units Subcutaneous 3 times per day  . insulin aspart  0-5 Units Subcutaneous QHS  . insulin aspart  0-9 Units Subcutaneous TID WC  . insulin glargine  55 Units Subcutaneous Daily   And  . insulin glargine  55 Units Subcutaneous Daily  . ipratropium-albuterol  3 mL Nebulization Q6H  . isosorbide mononitrate  60 mg Oral Daily  . levothyroxine  75 mcg Oral QAC breakfast  . loratadine  10 mg Oral Daily  . methylPREDNISolone (SOLU-MEDROL) injection  40 mg Intravenous 3 times per day  . metoprolol tartrate  12.5 mg Oral BID  . nystatin  5 mL Oral QID  . pantoprazole  40 mg Oral BID  . pregabalin  75 mg Oral QHS  . rOPINIRole  2 mg Oral QHS  . sodium chloride  3 mL Intravenous Q12H  . traZODone  50 mg Oral QHS  . venlafaxine XR  37.5 mg Oral QHS   sodium chloride, sodium chloride, acetaminophen **OR** acetaminophen, alteplase, benzonatate, bisacodyl, heparin, ipratropium-albuterol, lidocaine (PF), lidocaine-prilocaine, morphine injection, nitroGLYCERIN, ondansetron **OR** ondansetron (ZOFRAN) IV, oxyCODONE, pentafluoroprop-tetrafluoroeth, sodium chloride  Assessment/ Plan:  74 y.o. female with end-stage renal disease, carotid stenosis, coronary artery disease, diabetes type 2, history of myocardial infarction, hypertension, peripheral vascular disease  1.  ESRD on HD MWF:   - Patient completed dialysis yesterday. No acute indication for dialysis today. Next dialysis on Monday.  2.  Anemia of CKD:  Hemoglobin currently 9.6. Continue Epogen with  dialysis.  3.  SHPTH:  - Continue PhosLo for phosphorous control.  4.  Shortness of breath/pulmonary edema/COPD:   -  Patient continues to have shortness of breath despite treatment with steroids, ultrafiltration with dialysis, nebulizers, and O2. Further treatment per hospitalist.   LOS: 8 Anwar Crill 11/26/201612:14 PM

## 2014-12-15 NOTE — Consult Note (Signed)
PULMONARY / CRITICAL CARE MEDICINE   Name: Savannah Key MRN: [id number] DOB: 1940-05-03    ADMISSION DATE:  12/07/2014 CONSULTATION DATE:  12/13/14  REFERRING MD :  Dr. Hilton Sinclair   CHIEF COMPLAINT:     Shortness of breath   HISTORY OF PRESENT ILLNESS   74 y.o. female with a known history of systolic congestive heart failure EF 45-50%, end-stage renal disease on hemodialysis Monday Wednesday Friday presenting with shortness of breath and cough on 12/07/14. She describes cough nonproductive for approximately 2 week total duration now with associated shortness of breath she was breath both with exertion and now at rest she also attests to having lower extremity edema worsening with unusual as well as orthopnea which she sleeps in a recliner. She did receive dialysis today however states that she was under her dry weight so did not have any volume removed on the day of admission. She has gotten extra dialysis, but still with sob. She has a significant hx of second hand smoke exposure>35 years from her husband.   SUBJECTIVE: Patient awake and alert this morning, states that her breathing is stable, feels more energetic, tolerated physical therapy yesterday.  SIGNIFICANT EVENTS     PAST MEDICAL HISTORY    :  Past Medical History  Diagnosis Date  . Chronic kidney disease   . GERD (gastroesophageal reflux disease)   . Carotid artery stenosis   . Coronary artery disease   . Hypothyroidism   . Diabetes mellitus without complication (HCC)   . Depression   . Arthritis   . Kidney failure   . Liver failure (HCC)   . MI (myocardial infarction) (HCC)   . HTN (hypertension)   . Broken leg     R  . CHF (congestive heart failure) [facility])    Past Surgical History  Procedure Laterality Date  . Av fistula placement Left   . Cholecystectomy    . Peripheral vascular catheterization N/A 06/11/2014    Procedure: A/V Shuntogram/Fistulagram;  Surgeon: Annice Needy, MD;  Location: [facility] INVASIVE  CV LAB;  Service: Cardiovascular;  Laterality: N/A;  . Peripheral vascular catheterization Left 06/11/2014    Procedure: A/V Shunt Intervention;  Surgeon: Annice Needy, MD;  Location: [facility] INVASIVE CV LAB;  Service: Cardiovascular;  Laterality: Left;  . Open reduction internal fixation (orif) distal radial fracture Left 08/09/2014    Procedure: OPEN REDUCTION INTERNAL FIXATION (ORIF) DISTAL RADIAL FRACTURE;  Surgeon: Kennedy Bucker, MD;  Location: [facility] ORS;  Service: Orthopedics;  Laterality: Left;  . Peripheral vascular catheterization Left 11/06/2014    Procedure: A/V Shuntogram/Fistulagram;  Surgeon: Renford Dills, MD;  Location: [facility] INVASIVE CV LAB;  Service: Cardiovascular;  Laterality: Left;  . Peripheral vascular catheterization Left 11/06/2014    Procedure: A/V Shunt Intervention;  Surgeon: Renford Dills, MD;  Location: [facility] INVASIVE CV LAB;  Service: Cardiovascular;  Laterality: Left;   Prior to Admission medications   Medication Sig Start Date End Date Taking? Authorizing Provider  aspirin EC 81 MG tablet Take 81 mg by mouth daily.    Yes Historical Provider, MD  atorvastatin (LIPITOR) 80 MG tablet Take 80 mg by mouth at bedtime.   Yes Historical Provider, MD  benzonatate (TESSALON) 100 MG capsule Take 100 mg by mouth 3 (three) times daily as needed for cough.   Yes Historical Provider, MD  calcium acetate (PHOSLO) 667 MG capsule Take 667 mg by mouth 3 (three) times daily with meals.   Yes Historical Provider, MD  cephALEXin (  KEFLEX) 250 MG capsule Take 250 mg by mouth at bedtime.    Yes Historical Provider, MD  cetirizine (ZYRTEC) 5 MG tablet Take 5 mg by mouth at bedtime.    Yes Historical Provider, MD  clopidogrel (PLAVIX) 75 MG tablet Take 1 tablet (75 mg total) by mouth daily. 10/04/14  Yes Srikar Sudini, MD  docusate sodium (COLACE) 100 MG capsule Take 100 mg by mouth at bedtime.   Yes Historical Provider, MD  insulin glargine (LANTUS) 100 UNIT/ML injection Inject 90 Units into  the skin daily.    Yes Historical Provider, MD  insulin NPH Human (HUMULIN N,NOVOLIN N) 100 UNIT/ML injection Inject 15 Units into the skin 3 (three) times daily before meals.    Yes Historical Provider, MD  isosorbide mononitrate (IMDUR) 60 MG 24 hr tablet Take 60 mg by mouth daily.    Yes Historical Provider, MD  levothyroxine (SYNTHROID, LEVOTHROID) 75 MCG tablet Take 75 mcg by mouth daily.   Yes Historical Provider, MD  loperamide (IMODIUM) 2 MG capsule Take 2 mg by mouth as needed for diarrhea or loose stools.   Yes Historical Provider, MD  nitroGLYCERIN (NITROSTAT) 0.4 MG SL tablet Place 0.4 mg under the tongue every 5 (five) minutes as needed for chest pain.   Yes Historical Provider, MD  pantoprazole (PROTONIX) 40 MG tablet Take 40 mg by mouth 2 (two) times daily.   Yes Historical Provider, MD  pregabalin (LYRICA) 75 MG capsule Take 75 mg by mouth at bedtime.   Yes Historical Provider, MD  ranitidine (ZANTAC) 150 MG tablet Take 150 mg by mouth at bedtime.   Yes Historical Provider, MD  rOPINIRole (REQUIP) 2 MG tablet Take 2 mg by mouth at bedtime.   Yes Historical Provider, MD  sodium chloride (OCEAN) 0.65 % SOLN nasal spray Place 1 spray into both nostrils as needed for congestion.   Yes Historical Provider, MD  traZODone (DESYREL) 50 MG tablet Take 50 mg by mouth at bedtime.   Yes Historical Provider, MD  venlafaxine XR (EFFEXOR-XR) 37.5 MG 24 hr capsule Take 37.5 mg by mouth at bedtime.   Yes Historical Provider, MD  oxyCODONE-acetaminophen (ROXICET) 5-325 MG per tablet Take 1 tablet by mouth every 8 (eight) hours as needed for moderate pain or severe pain (Do not drive or operate heavy machinery while taking as can cause drowsiness.). Patient not taking: Reported on 12/07/2014 07/15/14   Renford DillsLindsey Miller, NP   Allergies  Allergen Reactions  . Sulfa Antibiotics Other (See Comments)    Reaction:  Blood in the urine      FAMILY HISTORY   Family History  Problem Relation Age of Onset   . Hypertension Other       SOCIAL HISTORY    reports that she has never smoked. She has never used smokeless tobacco. She reports that she does not drink alcohol or use illicit drugs.  Review of Systems  Constitutional: Negative for fever, chills and weight loss.  HENT: Negative for hearing loss and tinnitus.   Eyes: Negative for blurred vision and double vision.  Respiratory: Positive for cough and shortness of breath.   Cardiovascular: Negative for chest pain.  Gastrointestinal: Negative for heartburn, nausea, vomiting and abdominal pain.  Genitourinary: Negative for dysuria and urgency.  Skin: Negative for itching and rash.  Neurological: Positive for weakness. Negative for dizziness and headaches.  Endo/Heme/Allergies: Does not bruise/bleed easily.  Psychiatric/Behavioral: Negative for depression.      VITAL SIGNS    Temp:  [97.6  F (36.4 C)-98.1 F (36.7 C)] 97.9 F (36.6 C) (11/26 0511) Pulse Rate:  [78-125] 109 (11/26 1109) Resp:  [14-22] 16 (11/26 1109) BP: (96-121)/(53-96) 98/56 mmHg (11/26 1113) SpO2:  [94 %-100 %] 94 % (11/26 1109) Weight:  [186 lb 12.8 oz (84.732 kg)-190 lb 4.1 oz (86.3 kg)] 188 lb 12.8 oz (85.639 kg) (11/26 0511) HEMODYNAMICS:   VENTILATOR SETTINGS:   INTAKE / OUTPUT:  Intake/Output Summary (Last 24 hours) at 12/15/14 1146 Last data filed at 12/15/14 0949  Gross per 24 hour  Intake      0 ml  Output   2050 ml  Net  -2050 ml       PHYSICAL EXAM   Physical Exam  Constitutional: She is oriented to person, place, and time. She appears well-developed and well-nourished.  HENT:  Head: Normocephalic and atraumatic.  Mild post oropharynx thrush, wears dentures  Eyes: Conjunctivae and EOM are normal. Pupils are equal, round, and reactive to light.  Neck: Normal range of motion. Neck supple.  Cardiovascular: Normal rate, regular rhythm, normal heart sounds and intact distal pulses.   Pulmonary/Chest: Effort normal.  Coarse upper  airway sounds Dec bs at the bases - but improving Dry cough  Abdominal: Soft. Bowel sounds are normal.  Musculoskeletal: Normal range of motion.  Neurological: She is alert and oriented to person, place, and time.  Skin: Skin is warm and dry.  Vitals reviewed.      LABS   LABS:  CBC  Recent Labs Lab 12/12/14 0514 12/15/14 0457 12/15/14 1052  WBC 7.9 6.0 6.9  HGB 10.3* 9.8* 9.6*  HCT 31.8* 31.4* 30.9*  PLT 123* 94* 99*   Coag's No results for input(s): APTT, INR in the last 168 hours. BMET  Recent Labs Lab 12/09/14 1552 12/10/14 0403 12/15/14 1052  NA 132* 138 133*  K 4.7 4.3 4.3  CL 97* 96* 93*  CO2 26 33* 28  BUN 52* 26* 42*  CREATININE 3.60* 2.54* 3.56*  GLUCOSE 438* 285* 321*   Electrolytes  Recent Labs Lab 12/09/14 1552 12/10/14 0403 12/15/14 1052  CALCIUM 7.7* 8.0* 8.1*  PHOS 4.0  4.0  --   --    Sepsis Markers No results for input(s): LATICACIDVEN, PROCALCITON, O2SATVEN in the last 168 hours. ABG No results for input(s): PHART, PCO2ART, PO2ART in the last 168 hours. Liver Enzymes  Recent Labs Lab 12/09/14 1552  ALBUMIN 3.0*   Cardiac Enzymes No results for input(s): TROPONINI, PROBNP in the last 168 hours. Glucose  Recent Labs Lab 12/13/14 1634 12/14/14 0739 12/14/14 1629 12/14/14 2130 12/15/14 0741 12/15/14 1112  GLUCAP 381* 363* 314* 157* 196* 294*     No results found for this or any previous visit (from the past 240 hour(s)).   Current facility-administered medications:  .  0.9 %  sodium chloride infusion, 100 mL, Intravenous, PRN, Munsoor Lateef, MD .  0.9 %  sodium chloride infusion, 100 mL, Intravenous, PRN, Munsoor Lateef, MD .  acetaminophen (TYLENOL) tablet 650 mg, 650 mg, Oral, Q6H PRN, 650 mg at 12/12/14 1839 **OR** acetaminophen (TYLENOL) suppository 650 mg, 650 mg, Rectal, Q6H PRN, Wyatt Haste, MD .  acetylcysteine (MUCOMYST) 20 % nebulizer / oral solution 2 mL, 2 mL, Nebulization, Q6H, Kaizlee Carlino, MD,  4 mL at 12/15/14 0842 .  alteplase (CATHFLO ACTIVASE) injection 2 mg, 2 mg, Intracatheter, Once PRN, Munsoor Lateef, MD .  aspirin EC tablet 81 mg, 81 mg, Oral, Daily, Wyatt Haste, MD, 81 mg at 12/15/14  1610 .  atorvastatin (LIPITOR) tablet 80 mg, 80 mg, Oral, QHS, Wyatt Haste, MD, 80 mg at 12/14/14 2321 .  benzonatate (TESSALON) capsule 100 mg, 100 mg, Oral, TID PRN, Wyatt Haste, MD, 100 mg at 12/14/14 1800 .  bisacodyl (DULCOLAX) EC tablet 5 mg, 5 mg, Oral, Daily PRN, Alford Highland, MD .  calcium acetate (PHOSLO) capsule 667 mg, 667 mg, Oral, TID WC, Wyatt Haste, MD, 667 mg at 12/15/14 1127 .  cefTRIAXone (ROCEPHIN) 1 g in dextrose 5 % 50 mL IVPB, 1 g, Intravenous, Q24H, Houston Siren, MD, 1 g at 12/14/14 1500 .  clopidogrel (PLAVIX) tablet 75 mg, 75 mg, Oral, Daily, Wyatt Haste, MD, 75 mg at 12/15/14 0919 .  docusate sodium (COLACE) capsule 100 mg, 100 mg, Oral, QHS, Wyatt Haste, MD, 100 mg at 12/14/14 2320 .  epoetin alfa (EPOGEN,PROCRIT) injection 10,000 Units, 10,000 Units, Intravenous, Q M,W,F-HD, Munsoor Lateef, MD, 10,000 Units at 12/14/14 1203 .  famotidine (PEPCID) tablet 10 mg, 10 mg, Oral, Daily, Wyatt Haste, MD, 10 mg at 12/15/14 0919 .  feeding supplement (NEPRO CARB STEADY) liquid 237 mL, 237 mL, Oral, TID BM, Alford Highland, MD, 237 mL at 12/13/14 1710 .  furosemide (LASIX) injection 40 mg, 40 mg, Intravenous, Once, Wyatt Haste, MD, 40 mg at 12/08/14 0035 .  heparin injection 1,000 Units, 1,000 Units, Dialysis, PRN, Munsoor Lateef, MD .  heparin injection 5,000 Units, 5,000 Units, Subcutaneous, 3 times per day, Wyatt Haste, MD, 5,000 Units at 12/15/14 [phone] .  insulin aspart (novoLOG) injection 0-5 Units, 0-5 Units, Subcutaneous, QHS, Wyatt Haste, MD, 5 Units at 12/13/14 2154 .  insulin aspart (novoLOG) injection 0-9 Units, 0-9 Units, Subcutaneous, TID WC, Wyatt Haste, MD, 5 Units at 12/15/14 1127 .  insulin glargine (LANTUS) injection 55 Units, 55 Units,  Subcutaneous, Daily, 55 Units at 12/15/14 0925 **AND** insulin glargine (LANTUS) injection 55 Units, 55 Units, Subcutaneous, Daily, Alford Highland, MD, 55 Units at 12/15/14 [phone] .  ipratropium-albuterol (DUONEB) 0.5-2.5 (3) MG/3ML nebulizer solution 3 mL, 3 mL, Nebulization, Q6H, Lester Platas, MD, 3 mL at 12/15/14 0842 .  ipratropium-albuterol (DUONEB) 0.5-2.5 (3) MG/3ML nebulizer solution 3 mL, 3 mL, Nebulization, Q4H PRN, Amori Colomb, MD, 3 mL at 12/13/14 1459 .  isosorbide mononitrate (IMDUR) 24 hr tablet 60 mg, 60 mg, Oral, Daily, Wyatt Haste, MD, 60 mg at 12/15/14 0919 .  levothyroxine (SYNTHROID, LEVOTHROID) tablet 75 mcg, 75 mcg, Oral, QAC breakfast, Wyatt Haste, MD, 75 mcg at 12/15/14 0855 .  lidocaine (PF) (XYLOCAINE) 1 % injection 5 mL, 5 mL, Intradermal, PRN, Munsoor Lateef, MD .  lidocaine-prilocaine (EMLA) cream 1 application, 1 application, Topical, PRN, Munsoor Lateef, MD .  loratadine (CLARITIN) tablet 10 mg, 10 mg, Oral, Daily, Wyatt Haste, MD, 10 mg at 12/15/14 0919 .  methylPREDNISolone sodium succinate (SOLU-MEDROL) 40 mg/mL injection 40 mg, 40 mg, Intravenous, 3 times per day, Houston Siren, MD, 40 mg at 12/15/14 0517 .  metoprolol tartrate (LOPRESSOR) tablet 12.5 mg, 12.5 mg, Oral, BID, Enedina Finner, MD, 12.5 mg at 12/14/14 2321 .  morphine 2 MG/ML injection 2 mg, 2 mg, Intravenous, Q4H PRN, Wyatt Haste, MD, 2 mg at 12/13/14 1930 .  nitroGLYCERIN (NITROSTAT) SL tablet 0.4 mg, 0.4 mg, Sublingual, Q5 min PRN, Wyatt Haste, MD .  nystatin (MYCOSTATIN) 100000 UNIT/ML suspension 500,000 Units, 5 mL, Oral, QID, Alford Highland, MD, 500,000 Units at 12/15/14 [phone] .  ondansetron (  ZOFRAN) tablet 4 mg, 4 mg, Oral, Q6H PRN, 4 mg at 12/10/14 0514 **OR** ondansetron (ZOFRAN) injection 4 mg, 4 mg, Intravenous, Q6H PRN, Wyatt Haste, MD, 4 mg at 12/14/14 0909 .  oxyCODONE (Oxy IR/ROXICODONE) immediate release tablet 5 mg, 5 mg, Oral, Q4H PRN, Wyatt Haste, MD, 5 mg at 12/15/14  1114 .  pantoprazole (PROTONIX) EC tablet 40 mg, 40 mg, Oral, BID, Wyatt Haste, MD, 40 mg at 12/15/14 0919 .  pentafluoroprop-tetrafluoroeth (GEBAUERS) aerosol 1 application, 1 application, Topical, PRN, Munsoor Lateef, MD .  pregabalin (LYRICA) capsule 75 mg, 75 mg, Oral, QHS, Wyatt Haste, MD, 75 mg at 12/14/14 2322 .  rOPINIRole (REQUIP) tablet 2 mg, 2 mg, Oral, QHS, Wyatt Haste, MD, 2 mg at 12/14/14 2331 .  sodium chloride (OCEAN) 0.65 % nasal spray 1 spray, 1 spray, Each Nare, PRN, Wyatt Haste, MD .  sodium chloride 0.9 % injection 3 mL, 3 mL, Intravenous, Q12H, Wyatt Haste, MD, 3 mL at 12/15/14 0929 .  traZODone (DESYREL) tablet 50 mg, 50 mg, Oral, QHS, Wyatt Haste, MD, 50 mg at 12/14/14 2322 .  venlafaxine XR (EFFEXOR-XR) 24 hr capsule 37.5 mg, 37.5 mg, Oral, QHS, Wyatt Haste, MD, 37.5 mg at 12/14/14 2322  IMAGING  No results found.   No results found.    Indwelling Urinary Catheter continued, requirement due to   Reason to continue Indwelling Urinary Catheter for strict Intake/Output monitoring for hemodynamic instability   Central Line continued, requirement due to   Reason to continue [employer] Monitoring of central venous pressure or other hemodynamic parameters   Ventilator continued, requirement due to, resp failure    Ventilator Sedation RASS 0 to -2   Cultures: BCx2  UC  Sputum  Antibiotics:  Lines:   ASSESSMENT/PLAN  74 yo female with PMHx of HLD, DM, sCHF, tobacco exposure (2nd hand), seen in consultation for abnormal CT findings, dyspnea and cough  Dyspnea, cough, abnormal CT findings -CT findings with bilateral upper lobe reticulonodular, centrilobular opacities, most likely a infectious etiology probably a mild atypical pneumonia versus bronchitis. Patient can have outpatient follow-up CT in 3 months. -Recommend to complete her current course of antibiotics which includes azithromycin and Rocephin, total antibiotic duration 7  days -Clinical picture of her respiratory status is more in line with a recent upper respiratory tract infection/bronchitis, for which she is being properly treated for -Recommend to start weaning steroids and converted to oral, start with prednisone 40 mg by mouth and wean over 2 weeks -Stop Pulmicort since patient is started to develop thrush -Incentive spirometer -Mucomyst with DuoNeb's for the next 3 days -Obtain sputum culture -Overall patient with good clinical improvement, her dyspnea at this point is more multifactorial with end-stage renal disease, deconditioning, recent respiratory tract infection. Incentive spirometer and physical therapy and out of bed to chair will aid in her overall improvement.  ESRD - on HD - followed by Nephrology  DM - on SSI - follow glucose closely since on steroids.   Pulmonary consult time - 35 mins  Thank you for pulmonary consult, we will continue to follow along.   Stephanie Acre, MD  Pulmonary and Critical Care Pager [phone] (please enter 7-digits) On Call Pager - [phone] (please enter 7-digits)     12/15/2014, 11:46 AM  Note: This note was prepared with Dragon dictation along with smaller phrase technology. Any transcriptional errors that result from this process are unintentional.

## 2014-12-15 NOTE — Progress Notes (Signed)
Received report from [employer]ail J. Patient is in bed resting. No complaints of pain.

## 2014-12-15 NOTE — Progress Notes (Addendum)
Patient ID: Savannah Key, female   DOB: 04/18/1940, 74 y.o.   MRN: [id number] [facility] PROGRESS NOTE  PCP: Bobbye Morton, MD  HPI/Subjective: Still with coughing fits. She feels that she is breathing just only a slightly bit better. Still short of breath.  Objective: Filed Vitals:   12/15/14 1109 12/15/14 1113  BP: 96/57 98/56  Pulse: 109   Temp: 97.5 F (36.4 C)   Resp: 16     Filed Weights   12/14/14 1319 12/14/14 1401 12/15/14 0511  Weight: 86.3 kg (190 lb 4.1 oz) 84.732 kg (186 lb 12.8 oz) 85.639 kg (188 lb 12.8 oz)    ROS: Review of Systems  Constitutional: Negative for fever and chills.  Eyes: Negative for blurred vision.  Respiratory: Positive for cough, shortness of breath and wheezing.   Cardiovascular: Positive for chest pain.  Gastrointestinal: Negative for nausea, vomiting, abdominal pain, diarrhea and constipation.  Genitourinary: Negative for dysuria.  Musculoskeletal: Negative for joint pain.  Neurological: Negative for dizziness and headaches.   Exam: Physical Exam  Constitutional: She is oriented to person, place, and time.  HENT:  Nose: No mucosal edema.  Mouth/Throat: No oropharyngeal exudate or posterior oropharyngeal edema.  Thrush in the back of the mouth.  Eyes: Conjunctivae, EOM and lids are normal. Pupils are equal, round, and reactive to light.  Neck: No JVD present. Carotid bruit is not present. No edema present. No thyroid mass and no thyromegaly present.  Cardiovascular: S1 normal and S2 normal.  Exam reveals no gallop.   No murmur heard. Pulses:      Dorsalis pedis pulses are 2+ on the right side, and 2+ on the left side.  Respiratory: No respiratory distress. She has decreased breath sounds in the right middle field, the right lower field, the left middle field and the left lower field. She has wheezes in the left middle field and the left lower field. She has no rhonchi. She has no rales.  GI: Soft. Bowel sounds are  normal. There is no tenderness.  Musculoskeletal:       Right ankle: She exhibits swelling.       Left ankle: She exhibits swelling.  Lymphadenopathy:    She has no cervical adenopathy.  Neurological: She is alert and oriented to person, place, and time. No cranial nerve deficit.  Skin: Skin is warm. No rash noted. Nails show no clubbing.  Psychiatric: She has a normal mood and affect.    Data Reviewed: Basic Metabolic Panel:  Recent Labs Lab 12/09/14 1552 12/10/14 0403 12/15/14 1052  NA 132* 138 133*  K 4.7 4.3 4.3  CL 97* 96* 93*  CO2 26 33* 28  GLUCOSE 438* 285* 321*  BUN 52* 26* 42*  CREATININE 3.60* 2.54* 3.56*  CALCIUM 7.7* 8.0* 8.1*  PHOS 4.0  4.0  --   --    Liver Function Tests:  Recent Labs Lab 12/09/14 1552  ALBUMIN 3.0*   CBC:  Recent Labs Lab 12/10/14 0403 12/11/14 0346 12/12/14 0514 12/15/14 0457 12/15/14 1052  WBC 6.7 6.2 7.9 6.0 6.9  NEUTROABS  --   --   --   --  6.2  HGB 9.3* 9.6* 10.3* 9.8* 9.6*  HCT 29.2* 30.9* 31.8* 31.4* 30.9*  MCV 86.8 86.4 85.8 84.6 84.7  PLT 93* 104* 123* 94* 99*    CBG:  Recent Labs Lab 12/14/14 0739 12/14/14 1629 12/14/14 2130 12/15/14 0741 12/15/14 1112  GLUCAP 363* 314* 157* 196* 294*  Scheduled Meds: . acetylcysteine  2 mL Nebulization Q6H  . aspirin EC  81 mg Oral Daily  . atorvastatin  80 mg Oral QHS  . calcium acetate  667 mg Oral TID WC  . cefTRIAXone (ROCEPHIN)  IV  1 g Intravenous Q24H  . clopidogrel  75 mg Oral Daily  . docusate sodium  100 mg Oral QHS  . epoetin (EPOGEN/PROCRIT) injection  10,000 Units Intravenous Q M,W,F-HD  . famotidine  10 mg Oral Daily  . feeding supplement (NEPRO CARB STEADY)  237 mL Oral TID BM  . furosemide  40 mg Intravenous Once  . heparin  5,000 Units Subcutaneous 3 times per day  . insulin aspart  0-5 Units Subcutaneous QHS  . insulin aspart  0-9 Units Subcutaneous TID WC  . insulin glargine  55 Units Subcutaneous Daily   And  . insulin glargine  55  Units Subcutaneous Daily  . ipratropium-albuterol  3 mL Nebulization Q6H  . isosorbide mononitrate  60 mg Oral Daily  . levothyroxine  75 mcg Oral QAC breakfast  . loratadine  10 mg Oral Daily  . methylPREDNISolone (SOLU-MEDROL) injection  40 mg Intravenous 3 times per day  . metoprolol tartrate  12.5 mg Oral BID  . nystatin  5 mL Oral QID  . pantoprazole  40 mg Oral BID  . pregabalin  75 mg Oral QHS  . rOPINIRole  2 mg Oral QHS  . sodium chloride  3 mL Intravenous Q12H  . traZODone  50 mg Oral QHS  . venlafaxine XR  37.5 mg Oral QHS    Assessment/Plan:  1. Asthmatic bronchitis- patient is on Rocephin and Zithromax was completed and IV Solu-Medrol. DuoNeb nebulizers. Appreciate pulmonary consultation for abnormal CT scan of the chest. They recommend following up a CT scan of the chest in 3 months after completion of antibiotic course. Still not breathing well enough in order to go home. 2. Acute on chronic systolic congestive heart failure- the patient has been dialyzed almost daily without relief of symptoms. Likely more lung related than fluid related. Patient is on metoprolol and Lasix. 3. End-stage renal disease on hemodialysis- hemodialysis as per nephrology 4. Hypothyroidism unspecified continue levothyroxine 5. Gastroesophageal reflux disease without esophagitis continue Protonix 6. Cirrhosis seen on imaging of the liver 7. Type 2 diabetes with hyperglycemia secondary to steroids continue sliding scale insulin and Lantus insulin increased to 110 units.  8. Hyperlipidemia unspecified continue atorvastatin 9. Depression- no changes in psychiatric medications 10. Thrush nystatin swish and swallow 11. Constipation- Dulcolax as needed  Code Status:     Code Status Orders        Start     Ordered   12/07/14 2212  Full code   Continuous     12/07/14 2211     Disposition Plan: Home when breathing better. Spoke with daughter today and gave update.  Time spent: 20  minutes  Alford HighlandWIETING, Margalit Leece  [facility]

## 2014-12-16 LAB — GLUCOSE, CAPILLARY
GLUCOSE-CAPILLARY: 287 mg/dL — AB (ref 65–99)
GLUCOSE-CAPILLARY: 308 mg/dL — AB (ref 65–99)
Glucose-Capillary: 447 mg/dL — ABNORMAL HIGH (ref 65–99)

## 2014-12-16 MED ORDER — DOXYCYCLINE HYCLATE 100 MG PO TABS
100.0000 mg | ORAL_TABLET | Freq: Two times a day (BID) | ORAL | Status: DC
  Administered 2014-12-16 – 2014-12-19 (×7): 100 mg via ORAL
  Filled 2014-12-16 (×7): qty 1

## 2014-12-16 MED ORDER — INSULIN GLARGINE 100 UNIT/ML ~~LOC~~ SOLN
55.0000 [IU] | Freq: Two times a day (BID) | SUBCUTANEOUS | Status: DC
  Administered 2014-12-16 – 2014-12-18 (×5): 55 [IU] via SUBCUTANEOUS
  Filled 2014-12-16 (×7): qty 0.55

## 2014-12-16 MED ORDER — INSULIN ASPART 100 UNIT/ML ~~LOC~~ SOLN
0.0000 [IU] | Freq: Three times a day (TID) | SUBCUTANEOUS | Status: DC
  Administered 2014-12-16: 8 [IU] via SUBCUTANEOUS
  Administered 2014-12-17: 3 [IU] via SUBCUTANEOUS
  Administered 2014-12-17: 8 [IU] via SUBCUTANEOUS
  Administered 2014-12-18: 5 [IU] via SUBCUTANEOUS
  Administered 2014-12-18 (×2): 3 [IU] via SUBCUTANEOUS
  Administered 2014-12-19: 5 [IU] via SUBCUTANEOUS
  Filled 2014-12-16: qty 3
  Filled 2014-12-16: qty 5
  Filled 2014-12-16: qty 3
  Filled 2014-12-16: qty 5
  Filled 2014-12-16 (×2): qty 8
  Filled 2014-12-16 (×2): qty 3

## 2014-12-16 MED ORDER — POLYETHYLENE GLYCOL 3350 17 G PO PACK
17.0000 g | PACK | Freq: Every day | ORAL | Status: DC
  Administered 2014-12-16: 17 g via ORAL
  Filled 2014-12-16 (×2): qty 1

## 2014-12-16 MED ORDER — BUDESONIDE 0.25 MG/2ML IN SUSP
0.2500 mg | Freq: Two times a day (BID) | RESPIRATORY_TRACT | Status: DC
  Administered 2014-12-16 – 2014-12-19 (×5): 0.25 mg via RESPIRATORY_TRACT
  Filled 2014-12-16 (×5): qty 2

## 2014-12-16 MED ORDER — INSULIN ASPART 100 UNIT/ML ~~LOC~~ SOLN
0.0000 [IU] | Freq: Every day | SUBCUTANEOUS | Status: DC
  Administered 2014-12-16 – 2014-12-17 (×2): 4 [IU] via SUBCUTANEOUS
  Administered 2014-12-18: 2 [IU] via SUBCUTANEOUS
  Filled 2014-12-16 (×2): qty 4
  Filled 2014-12-16: qty 2

## 2014-12-16 NOTE — Progress Notes (Signed)
[facility]  ROUNDING NOTE   Subjective:  As before patient continues to have some shortness of breath. Patient had dialysis last on Friday. Patient is due for dialysis again tomorrow.   Objective:  Vital signs in last 24 hours:  Temp:  [98 F (36.7 C)-99 F (37.2 C)] 99 F (37.2 C) (11/27 1102) Pulse Rate:  [76-90] 76 (11/27 1102) Resp:  [21-22] 21 (11/27 1102) BP: (91-103)/(46-67) 95/47 mmHg (11/27 1102) SpO2:  [95 %-99 %] 99 % (11/27 1102) Weight:  [87.272 kg (192 lb 6.4 oz)] 87.272 kg (192 lb 6.4 oz) (11/27 0430)  Weight change: -1.028 kg (-2 lb 4.3 oz) Filed Weights   12/14/14 1401 12/15/14 0511 12/16/14 0430  Weight: 84.732 kg (186 lb 12.8 oz) 85.639 kg (188 lb 12.8 oz) 87.272 kg (192 lb 6.4 oz)    Intake/Output: I/O last 3 completed shifts: In: 580 [P.O.:480; IV Piggyback:100] Out: 100 [Urine:100]   Intake/Output this shift:        Physical Exam: General: NAD, sitting up in bed  Head: Normocephalic, atraumatic. Moist oral mucosal membranes  Eyes: Anicteric  Neck: Supple, trachea midline  Lungs:  Bilateral rhonchi noted, normal effort   Heart: S1S2 no rubs  Abdomen:  Soft, nontender, BS present   Extremities:  1+ peripheral edema.  Neurologic: Nonfocal, moving all four extremities  Skin: No lesions  Access: LUE AVF    Basic Metabolic Panel:  Recent Labs Lab 12/09/14 1552 12/10/14 0403 12/15/14 1052  NA 132* 138 133*  K 4.7 4.3 4.3  CL 97* 96* 93*  CO2 26 33* 28  GLUCOSE 438* 285* 321*  BUN 52* 26* 42*  CREATININE 3.60* 2.54* 3.56*  CALCIUM 7.7* 8.0* 8.1*  PHOS 4.0  4.0  --   --     Liver Function Tests:  Recent Labs Lab 12/09/14 1552  ALBUMIN 3.0*   No results for input(s): LIPASE, AMYLASE in the last 168 hours. No results for input(s): AMMONIA in the last 168 hours.  CBC:  Recent Labs Lab 12/10/14 0403 12/11/14 0346 12/12/14 0514 12/15/14 0457 12/15/14 1052  WBC 6.7 6.2 7.9 6.0 6.9  NEUTROABS  --   --   --    --  6.2  HGB 9.3* 9.6* 10.3* 9.8* 9.6*  HCT 29.2* 30.9* 31.8* 31.4* 30.9*  MCV 86.8 86.4 85.8 84.6 84.7  PLT 93* 104* 123* 94* 99*    Cardiac Enzymes: No results for input(s): CKTOTAL, CKMB, CKMBINDEX, TROPONINI in the last 168 hours.  BNP: Invalid input(s): POCBNP  CBG:  Recent Labs Lab 12/14/14 2130 12/15/14 0741 12/15/14 1112 12/15/14 1611 12/15/14 2047  GLUCAP 157* 196* 294* 347* 314*    Microbiology: Results for orders placed or performed during the hospital encounter of 10/01/14  Blood culture (routine x 2)     Status: None   Collection Time: 10/01/14  4:40 AM  Result Value Ref Range Status   Specimen Description BLOOD RIGHT THUMB  Final   Special Requests BOTTLES DRAWN AEROBIC AND ANAEROBIC 2ML  Final   Culture NO GROWTH 5 DAYS  Final   Report Status 10/06/2014 FINAL  Final  Blood culture (routine x 2)     Status: None   Collection Time: 10/01/14  4:40 AM  Result Value Ref Range Status   Specimen Description BLOOD RIGHT HAND  Final   Special Requests BOTTLES DRAWN AEROBIC AND ANAEROBIC 2ML  Final   Culture NO GROWTH 5 DAYS  Final   Report Status 10/06/2014 FINAL  Final  MRSA PCR Screening     Status: None   Collection Time: 10/01/14  8:47 AM  Result Value Ref Range Status   MRSA by PCR NEGATIVE NEGATIVE Final    Comment:        The GeneXpert MRSA Assay (FDA approved for NASAL specimens only), is one component of a comprehensive MRSA colonization surveillance program. It is not intended to diagnose MRSA infection nor to guide or monitor treatment for MRSA infections.     Coagulation Studies: No results for input(s): LABPROT, INR in the last 72 hours.  Urinalysis: No results for input(s): COLORURINE, LABSPEC, PHURINE, GLUCOSEU, HGBUR, BILIRUBINUR, KETONESUR, PROTEINUR, UROBILINOGEN, NITRITE, LEUKOCYTESUR in the last 72 hours.  Invalid input(s): APPERANCEUR    Imaging: No results found.   Medications:     . aspirin EC  81 mg Oral Daily   . atorvastatin  80 mg Oral QHS  . calcium acetate  667 mg Oral TID WC  . cefTRIAXone (ROCEPHIN)  IV  1 g Intravenous Q24H  . clopidogrel  75 mg Oral Daily  . docusate sodium  100 mg Oral QHS  . doxycycline  100 mg Oral Q12H  . epoetin (EPOGEN/PROCRIT) injection  10,000 Units Intravenous Q M,W,F-HD  . famotidine  10 mg Oral Daily  . feeding supplement (NEPRO CARB STEADY)  237 mL Oral TID BM  . furosemide  40 mg Intravenous Once  . heparin  5,000 Units Subcutaneous 3 times per day  . insulin aspart  0-5 Units Subcutaneous QHS  . insulin aspart  0-9 Units Subcutaneous TID WC  . insulin glargine  55 Units Subcutaneous Daily   And  . insulin glargine  55 Units Subcutaneous Daily  . isosorbide mononitrate  60 mg Oral Daily  . levothyroxine  75 mcg Oral QAC breakfast  . loratadine  10 mg Oral Daily  . methylPREDNISolone (SOLU-MEDROL) injection  40 mg Intravenous 3 times per day  . metoprolol tartrate  12.5 mg Oral BID  . nystatin  5 mL Oral QID  . pantoprazole  40 mg Oral BID  . polyethylene glycol  17 g Oral Daily  . pregabalin  75 mg Oral QHS  . rOPINIRole  2 mg Oral QHS  . sodium chloride  3 mL Intravenous Q12H  . traZODone  50 mg Oral QHS  . venlafaxine XR  37.5 mg Oral QHS   sodium chloride, sodium chloride, acetaminophen **OR** acetaminophen, alteplase, benzonatate, bisacodyl, heparin, ipratropium-albuterol, lidocaine (PF), lidocaine-prilocaine, morphine injection, nitroGLYCERIN, ondansetron **OR** ondansetron (ZOFRAN) IV, oxyCODONE, pentafluoroprop-tetrafluoroeth, sodium chloride  Assessment/ Plan:  74 y.o. female with end-stage renal disease, carotid stenosis, coronary artery disease, diabetes type 2, history of myocardial infarction, hypertension, peripheral vascular disease  1.  ESRD on HD MWF:   - we will plan for hemodialysis again tomorrow.  We will prepare orders for this..  2.  Anemia of CKD:  Continue Epogen 10,000 units IV with dialysis..  3.  SHPTH:  - followup  phosphorus with next dialysis treatment.ontinue PhosLo 667 mg by mouth 3 times a daywith meals.  4.  Shortness of breath/pulmonary edema/COPD:   -  Patient has been treated for pulmonary edema, pneumonia, and underlying COPD.  Despite this the patient continues to have significant shortness of breath.  Continue supportive care at this point in time.  We will perform further ultrafiltration tomorrow to see if this improves her shortness of breath any further. Pulmonary has also been consulted.  LOS: 9 Savannah Key 11/27/201611:15 AM

## 2014-12-16 NOTE — Progress Notes (Signed)
Patient ID: Savannah ShawlLinda M Key, female   DOB: 08/10/1940, 74 y.o.   MRN: [id number] [facility]  PCP: Bobbye MortonSHARON A REILLY, MD  HPI/Subjective: Still not feeling much better still with coughing fits chest pain shortness breath cough and wheezing.  Objective: Filed Vitals:   12/16/14 0900 12/16/14 1102  BP: 96/46 95/47  Pulse: 90 76  Temp: 98 F (36.7 C) 99 F (37.2 C)  Resp: 22 21    Filed Weights   12/14/14 1401 12/15/14 0511 12/16/14 0430  Weight: 84.732 kg (186 lb 12.8 oz) 85.639 kg (188 lb 12.8 oz) 87.272 kg (192 lb 6.4 oz)    ROS: Review of Systems  Constitutional: Negative for fever and chills.  Eyes: Negative for blurred vision.  Respiratory: Positive for cough, shortness of breath and wheezing.   Cardiovascular: Positive for chest pain.  Gastrointestinal: Negative for nausea, vomiting, abdominal pain, diarrhea and constipation.  Genitourinary: Negative for dysuria.  Musculoskeletal: Negative for joint pain.  Neurological: Negative for dizziness and headaches.   Exam: Physical Exam  Constitutional: She is oriented to person, place, and time.  HENT:  Nose: No mucosal edema.  Mouth/Throat: No oropharyngeal exudate or posterior oropharyngeal edema.  Eyes: Conjunctivae, EOM and lids are normal. Pupils are equal, round, and reactive to light.  Neck: No JVD present. Carotid bruit is not present. No edema present. No thyroid mass and no thyromegaly present.  Cardiovascular: S1 normal and S2 normal.  Exam reveals no gallop.   No murmur heard. Pulses:      Dorsalis pedis pulses are 2+ on the right side, and 2+ on the left side.  Respiratory: No respiratory distress. She has decreased breath sounds in the right middle field, the right lower field, the left middle field and the left lower field. She has wheezes in the left middle field and the left lower field. She has no rhonchi. She has no rales.  GI: Soft. Bowel sounds are normal. There is no  tenderness.  Musculoskeletal:       Right ankle: She exhibits swelling.       Left ankle: She exhibits swelling.  Lymphadenopathy:    She has no cervical adenopathy.  Neurological: She is alert and oriented to person, place, and time. No cranial nerve deficit.  Skin: Skin is warm. No rash noted. Nails show no clubbing.  Psychiatric: She has a normal mood and affect.    Data Reviewed: Basic Metabolic Panel:  Recent Labs Lab 12/10/14 0403 12/15/14 1052  NA 138 133*  K 4.3 4.3  CL 96* 93*  CO2 33* 28  GLUCOSE 285* 321*  BUN 26* 42*  CREATININE 2.54* 3.56*  CALCIUM 8.0* 8.1*   CBC:  Recent Labs Lab 12/10/14 0403 12/11/14 0346 12/12/14 0514 12/15/14 0457 12/15/14 1052  WBC 6.7 6.2 7.9 6.0 6.9  NEUTROABS  --   --   --   --  6.2  HGB 9.3* 9.6* 10.3* 9.8* 9.6*  HCT 29.2* 30.9* 31.8* 31.4* 30.9*  MCV 86.8 86.4 85.8 84.6 84.7  PLT 93* 104* 123* 94* 99*    CBG:  Recent Labs Lab 12/15/14 0741 12/15/14 1112 12/15/14 1611 12/15/14 2047 12/16/14 1104  GLUCAP 196* 294* 347* 314* 447*     Scheduled Meds: . aspirin EC  81 mg Oral Daily  . atorvastatin  80 mg Oral QHS  . budesonide (PULMICORT) nebulizer solution  0.25 mg Nebulization BID  . calcium acetate  667 mg Oral TID WC  . cefTRIAXone (ROCEPHIN)  IV  1 g Intravenous Q24H  . clopidogrel  75 mg Oral Daily  . docusate sodium  100 mg Oral QHS  . doxycycline  100 mg Oral Q12H  . epoetin (EPOGEN/PROCRIT) injection  10,000 Units Intravenous Q M,W,F-HD  . famotidine  10 mg Oral Daily  . feeding supplement (NEPRO CARB STEADY)  237 mL Oral TID BM  . furosemide  40 mg Intravenous Once  . heparin  5,000 Units Subcutaneous 3 times per day  . insulin aspart  0-15 Units Subcutaneous TID WC  . insulin aspart  0-5 Units Subcutaneous QHS  . insulin glargine  55 Units Subcutaneous BID  . isosorbide mononitrate  60 mg Oral Daily  . levothyroxine  75 mcg Oral QAC breakfast  . loratadine  10 mg Oral Daily  .  methylPREDNISolone (SOLU-MEDROL) injection  40 mg Intravenous 3 times per day  . metoprolol tartrate  12.5 mg Oral BID  . nystatin  5 mL Oral QID  . pantoprazole  40 mg Oral BID  . polyethylene glycol  17 g Oral Daily  . pregabalin  75 mg Oral QHS  . rOPINIRole  2 mg Oral QHS  . sodium chloride  3 mL Intravenous Q12H  . traZODone  50 mg Oral QHS  . venlafaxine XR  37.5 mg Oral QHS    Assessment/Plan:  1. Asthmatic bronchitis- patient is on Rocephin and IV Solu-Medrol. I added doxycycline today. DuoNeb nebulizers, restarted budesonide nebulizer. Appreciate pulmonary consultation for abnormal CT scan of the chest. They recommend following up a CT scan of the chest in 3 months after completion of antibiotic course. Still not breathing well enough in order to go home. 2. Acute on chronic systolic congestive heart failure- the patient has been dialyzed almost daily without relief of symptoms. Likely more lung related than fluid related. Patient is on metoprolol and Lasix. 3. End-stage renal disease on hemodialysis- hemodialysis as per nephrology 4. Hypothyroidism unspecified continue levothyroxine 5. Gastroesophageal reflux disease without esophagitis continue Protonix 6. Cirrhosis seen on imaging of the liver 7. Type 2 diabetes with hyperglycemia secondary to steroids and someone switched to Lantus to half dose. I will increase Lantus to 55 units subcutaneous injection twice a day. Increase the sliding scale coverage. 8. Hyperlipidemia unspecified continue atorvastatin 9. Depression- no changes in psychiatric medications 10. Thrush nystatin swish and swallow 11. Constipation- MiraLAX added  Code Status:     Code Status Orders        Start     Ordered   12/07/14 2212  Full code   Continuous     12/07/14 2211     Disposition Plan: Home when breathing better.  Time spent: 20 minutes  Alford Highland  [facility]

## 2014-12-17 LAB — GLUCOSE, CAPILLARY
GLUCOSE-CAPILLARY: 163 mg/dL — AB (ref 65–99)
GLUCOSE-CAPILLARY: 306 mg/dL — AB (ref 65–99)
Glucose-Capillary: 260 mg/dL — ABNORMAL HIGH (ref 65–99)
Glucose-Capillary: 126 mg/dL — ABNORMAL HIGH (ref 65–99)

## 2014-12-17 MED ORDER — SODIUM CHLORIDE 3 % IN NEBU
5.0000 mL | INHALATION_SOLUTION | Freq: Every day | RESPIRATORY_TRACT | Status: DC
Start: 2014-12-17 — End: 2014-12-19
  Administered 2014-12-17: 15 mL via RESPIRATORY_TRACT
  Filled 2014-12-17 (×3): qty 8

## 2014-12-17 NOTE — Care Management (Signed)
Patient off the floor for HD.  Will follow up at later time

## 2014-12-17 NOTE — Progress Notes (Signed)
PT Cancellation Note  Patient Details Name: Savannah ShawlLinda M Aggarwal MRN: [id number] DOB: 04/08/1940   Cancelled Treatment:    Reason Eval/Treat Not Completed: Fatigue/lethargy limiting ability to participate (Patient returned from dialysis and cleared by primary RN for participation with session.  Patient politely declined due to fatigue.  Will re-attempt next date as medically appropriate.)  Holli Rengel H. Manson PasseyBrown, PT, DPT, NCS 12/17/2014, 4:01 PM [phone]

## 2014-12-17 NOTE — Progress Notes (Signed)
Post hd tx 

## 2014-12-17 NOTE — Progress Notes (Signed)
Patient back from dialysis, afib on telemetry. SBP 80's, patient asymptomatic. BP noted to run low at times. Imdur held. No other complaints at this time. Patient took all meds without difficulty. Trudee KusterBrandi R Mansfield

## 2014-12-17 NOTE — Progress Notes (Signed)
[facility] [place] [facility]     Assessment and Plan:  74 yo female with PMHx of HLD, DM, sCHF, tobacco exposure (2nd hand), seen in consultation acute bronchitis.   Dyspnea, cough, likely due to Acute Bronchitis.  -CT findings with bilateral upper lobe reticulonodular, centrilobular opacities, most likely a infectious etiology probably a mild atypical pneumonia versus bronchitis. Patient can have outpatient follow-up CT in 3 months. -Recommend to complete her current course of antibiotics which includes azithromycin and Rocephin, total antibiotic duration 7 days -Recommend to start weaning steroids and converted to oral, patient can be discharged home with prednisone 40 mg by mouth and wean over 2 weeks -Treat cough symptomatically, advised patient that the cough will likely be present for several more weeks.  -Stop Pulmicort since patient is started to develop thrush -Incentive spirometer -Mucomyst with DuoNeb's for the next 3 days -Obtain sputum culture  ESRD - on HD - followed by Nephrology  Patient is doing well on room air, and is stable for discharge from respiratory standpoint.  Follow up outpatient with Dr. Dema Severin.   Will sign off for now, please call if there are questions or concerns.     Date: 12/17/2014  MRN# [id number] Savannah Key April 03, 1940   Savannah Key is a 74 y.o. old female seen in follow up for chief complaint of  Chief Complaint  Patient presents with  . Shortness of Breath     HPI:   Patient seen HD unit, she has no new complaints, she continues to have cough.   Allergies:  Sulfa antibiotics  Review of Systems: Gen:  Denies  fever, sweats. HEENT: Denies blurred vision. Cvc:  No dizziness, chest pain or heaviness Resp:   Denies sputum porduction. Gi: Denies swallowing difficulty, stomach pain. constipation, bowel incontinence Gu:  Denies bladder incontinence, burning urine Ext:   No Joint pain, stiffness. Skin: No skin rash, easy  bruising. Endoc:  No polyuria, polydipsia. Psych: No depression, insomnia. Other:  All other systems were reviewed and found to be negative other than what is mentioned in the HPI.   Physical Examination:   VS: BP 81/65 mmHg  Pulse 99  Temp(Src) 98.2 F (36.8 C) (Oral)  Resp 18  Ht 5' (1.524 m)  Wt 86.9 kg (191 lb 9.3 oz)  BMI 37.42 kg/m2  SpO2 99%  General Appearance: No distress  Neuro:without focal findings,  speech normal,  HEENT: PERRLA, EOM intact. Pulmonary: normal breath sounds, No wheezing.   CardiovascularNormal S1,S2.  No m/r/g.   Abdomen: Benign, Soft, non-tender. Renal:  No costovertebral tenderness  GU:  Not performed at this time. Endoc: No evident thyromegaly, no signs of acromegaly. Skin:   warm, no rash. Extremities: normal, no cyanosis, clubbing.   LABORATORY PANEL:   CBC  Recent Labs Lab 12/15/14 1052  WBC 6.9  HGB 9.6*  HCT 30.9*  PLT 99*   ------------------------------------------------------------------------------------------------------------------  Chemistries   Recent Labs Lab 12/15/14 1052  NA 133*  K 4.3  CL 93*  CO2 28  GLUCOSE 321*  BUN 42*  CREATININE 3.56*  CALCIUM 8.1*   ------------------------------------------------------------------------------------------------------------------  Cardiac Enzymes No results for input(s): TROPONINI in the last 168 hours. ------------------------------------------------------------  RADIOLOGY:   No results found for this or any previous visit. Results for orders placed during the hospital encounter of 12/07/14  DG Chest 2 View   Narrative CLINICAL DATA:  Productive cough, wheezing, and shortness of breath for 2 weeks. Previous myocardial infarcts.  EXAM: CHEST  2 VIEW  COMPARISON:  10/01/2014  FINDINGS: Moderate cardiomegaly remains stable. Diffuse pulmonary vascular congestion again demonstrated, without evidence of frank edema or focal consolidation. Probable tiny  right pleural effusion noted posteriorly on lateral view.  IMPRESSION: Stable cardiomegaly and pulmonary vascular congestion. Probable tiny right posterior pleural effusion.   Electronically Signed   By: Myles RosenthalJohn  Stahl M.D.   On: 12/07/2014 18:44    ------------------------------------------------------------------------------------------------------------------  Thank  you for allowing [facility] to assist in the care of your patient. Our recommendations are noted above.  Please contact us if we can be of further service.   Wells Guileseep Markham Dumlao, MD.  [facility]  Santiago Gladavid Kasa, M.D.  Stephanie AcreVishal Mungal, M.D.  Billy Fischeravid Simonds, M.D

## 2014-12-17 NOTE — Progress Notes (Signed)
MD, Anne HahnWillis aware of patient's BP of 96/45 and HR of 96. Okay per Dr. To give Lopressor at this time.

## 2014-12-17 NOTE — Care Management Important Message (Signed)
Important Message  Patient Details  Name: Savannah ShawlLinda M Hy MRN: [id number] Date of Birth: 04/21/1940   Medicare Important Message Given:  Yes    Olegario MessierKathy A Sanyia Dini 12/17/2014, 10:12 AM

## 2014-12-17 NOTE — Progress Notes (Signed)
HD tx start 

## 2014-12-17 NOTE — Clinical Documentation Improvement (Signed)
Hospitalist Internal Medicine  Please clarify "asthmatic bronchitis" documented in Notes.   Asthma exacerbation  Acute bronchitis  Acute bronchitis with asthma exacerbation  Acute on chronic bronchitis with asthma exacerbation  Other  Clinically Undetermined  Supporting Information: --  "Asthmatic bronchitis- patient is on Rocephin and IV Solu-Medrol. I added doxycycline today. DuoNeb nebulizers, restarted budesonide nebulizer. Appreciate pulmonary consultation for abnormal CT scan of the chest. They recommend following up a CT scan of the chest in 3 months after completion of antibiotic course. Still not breathing well enough in order to go home."  Please exercise your independent, professional judgment when responding. A specific answer is not anticipated or expected.   Thank You, Beverley FiedlerLaurie E Vinaya Sancho RN [facility] [place] [phone]

## 2014-12-17 NOTE — Progress Notes (Signed)
Patient ID: Savannah Key, female   DOB: 18-Jun-1940, 74 y.o.   MRN: [id number] [facility] PROGRESS NOTE  PCP: Bobbye Morton, MD  HPI/Subjective: Patient still not feeling any better. Still with coughing fits and shortness of breath. Had a bowel movement yesterday.  Objective: Filed Vitals:   12/17/14 1430 12/17/14 1450  BP: 126/70 81/65  Pulse: 78 99  Temp:  98.2 F (36.8 C)  Resp: 18 18    Filed Weights   12/17/14 0542 12/17/14 1015 12/17/14 1413  Weight: 88.996 kg (196 lb 3.2 oz) 88.9 kg (195 lb 15.8 oz) 86.9 kg (191 lb 9.3 oz)    ROS: Review of Systems  Constitutional: Negative for fever and chills.  Eyes: Negative for blurred vision.  Respiratory: Positive for cough, shortness of breath and wheezing.   Cardiovascular: Positive for chest pain.  Gastrointestinal: Negative for nausea, vomiting, abdominal pain, diarrhea and constipation.  Genitourinary: Negative for dysuria.  Musculoskeletal: Negative for joint pain.  Neurological: Negative for dizziness and headaches.   Exam: Physical Exam  Constitutional: She is oriented to person, place, and time.  HENT:  Nose: No mucosal edema.  Mouth/Throat: No oropharyngeal exudate or posterior oropharyngeal edema.  Eyes: Conjunctivae, EOM and lids are normal. Pupils are equal, round, and reactive to light.  Neck: No JVD present. Carotid bruit is not present. No edema present. No thyroid mass and no thyromegaly present.  Cardiovascular: S1 normal and S2 normal.  Exam reveals no gallop.   No murmur heard. Pulses:      Dorsalis pedis pulses are 2+ on the right side, and 2+ on the left side.  Respiratory: No respiratory distress. She has decreased breath sounds in the right middle field, the right lower field, the left middle field and the left lower field. She has wheezes in the left middle field and the left lower field. She has no rhonchi. She has no rales.  GI: Soft. Bowel sounds are normal. There is no  tenderness.  Musculoskeletal:       Right ankle: She exhibits swelling.       Left ankle: She exhibits swelling.  Lymphadenopathy:    She has no cervical adenopathy.  Neurological: She is alert and oriented to person, place, and time. No cranial nerve deficit.  Skin: Skin is warm. No rash noted. Nails show no clubbing.  Psychiatric: She has a normal mood and affect.    Data Reviewed: Basic Metabolic Panel:  Recent Labs Lab 12/15/14 1052  NA 133*  K 4.3  CL 93*  CO2 28  GLUCOSE 321*  BUN 42*  CREATININE 3.56*  CALCIUM 8.1*   CBC:  Recent Labs Lab 12/11/14 0346 12/12/14 0514 12/15/14 0457 12/15/14 1052  WBC 6.2 7.9 6.0 6.9  NEUTROABS  --   --   --  6.2  HGB 9.6* 10.3* 9.8* 9.6*  HCT 30.9* 31.8* 31.4* 30.9*  MCV 86.4 85.8 84.6 84.7  PLT 104* 123* 94* 99*    CBG:  Recent Labs Lab 12/16/14 1104 12/16/14 1619 12/16/14 2123 12/17/14 0730 12/17/14 1451  GLUCAP 447* 287* 308* 260* 126*     Scheduled Meds: . aspirin EC  81 mg Oral Daily  . atorvastatin  80 mg Oral QHS  . budesonide (PULMICORT) nebulizer solution  0.25 mg Nebulization BID  . calcium acetate  667 mg Oral TID WC  . cefTRIAXone (ROCEPHIN)  IV  1 g Intravenous Q24H  . clopidogrel  75 mg Oral Daily  . docusate sodium  100 mg Oral QHS  . doxycycline  100 mg Oral Q12H  . epoetin (EPOGEN/PROCRIT) injection  10,000 Units Intravenous Q M,W,F-HD  . famotidine  10 mg Oral Daily  . feeding supplement (NEPRO CARB STEADY)  237 mL Oral TID BM  . furosemide  40 mg Intravenous Once  . heparin  5,000 Units Subcutaneous 3 times per day  . insulin aspart  0-15 Units Subcutaneous TID WC  . insulin aspart  0-5 Units Subcutaneous QHS  . insulin glargine  55 Units Subcutaneous BID  . isosorbide mononitrate  60 mg Oral Daily  . levothyroxine  75 mcg Oral QAC breakfast  . loratadine  10 mg Oral Daily  . methylPREDNISolone (SOLU-MEDROL) injection  40 mg Intravenous 3 times per day  . metoprolol tartrate  12.5 mg  Oral BID  . nystatin  5 mL Oral QID  . pantoprazole  40 mg Oral BID  . polyethylene glycol  17 g Oral Daily  . pregabalin  75 mg Oral QHS  . rOPINIRole  2 mg Oral QHS  . sodium chloride  3 mL Intravenous Q12H  . sodium chloride HYPERTONIC  5 mL Nebulization Daily  . traZODone  50 mg Oral QHS  . venlafaxine XR  37.5 mg Oral QHS    Assessment/Plan:  1. Asthmatic bronchitis- patient is on Rocephin and IV Solu-Medrol. I added doxycycline yesterday. DuoNeb nebulizers,  budesonide nebulizer. Appreciate pulmonary consultation for abnormal CT scan of the chest. They recommend following up a CT scan of the chest in 3 months after completion of antibiotic course. Still not breathing well enough in order to go home. 2. Acute on chronic systolic congestive heart failure- the patient has been dialyzed without relief of symptoms. Likely more lung related than fluid related. Patient is on metoprolol and Lasix. 3. End-stage renal disease on hemodialysis- hemodialysis as per nephrology 4. Hypothyroidism unspecified continue levothyroxine 5. Gastroesophageal reflux disease without esophagitis continue Protonix 6. Cirrhosis seen on imaging of the liver 7. Type 2 diabetes with hyperglycemia secondary to steroids and Lantus to 55 units subcutaneous injection twice a day. Increase the sliding scale coverage. 8. Hyperlipidemia unspecified continue atorvastatin 9. Depression- no changes in psychiatric medications 10. Thrush nystatin swish and swallow 11. Constipation- MiraLAX added  Code Status:     Code Status Orders        Start     Ordered   12/07/14 2212  Full code   Continuous     12/07/14 2211     Disposition Plan: Home when breathing better.  Time spent: 22 minutes, patient seen with her PMD today.  Alford HighlandWIETING, Darlys Buis  [facility]

## 2014-12-17 NOTE — Progress Notes (Signed)
[facility]  ROUNDING NOTE   Subjective:  Pt appeared to be breathing a bit easier today. Due for HD today as well. Has had rather persistent shortness of breath overall.    Objective:  Vital signs in last 24 hours:  Temp:  [97.4 F (36.3 C)-98.2 F (36.8 C)] 98.2 F (36.8 C) (11/28 1450) Pulse Rate:  [55-99] 99 (11/28 1450) Resp:  [14-22] 18 (11/28 1450) BP: (80-126)/(25-78) 81/65 mmHg (11/28 1450) SpO2:  [94 %-99 %] 99 % (11/28 1450) Weight:  [86.9 kg (191 lb 9.3 oz)-89.018 kg (196 lb 4 oz)] 86.9 kg (191 lb 9.3 oz) (11/28 1413)  Weight change: 1.746 kg (3 lb 13.6 oz) Filed Weights   12/17/14 0542 12/17/14 1015 12/17/14 1413  Weight: 88.996 kg (196 lb 3.2 oz) 88.9 kg (195 lb 15.8 oz) 86.9 kg (191 lb 9.3 oz)    Intake/Output: I/O last 3 completed shifts: In: 360 [P.O.:360] Out: 50 [Urine:50]   Intake/Output this shift:  Total I/O In: 240 [P.O.:240] Out: 2000 [Other:2000]     Physical Exam: General: NAD, sitting up in bed  Head: Normocephalic, atraumatic. Moist oral mucosal membranes  Eyes: Anicteric  Neck: Supple, trachea midline  Lungs:  Bilateral rhonchi noted, normal effort   Heart: S1S2 no rubs  Abdomen:  Soft, nontender, BS present   Extremities:  1+ peripheral edema.  Neurologic: Nonfocal, moving all four extremities  Skin: No lesions  Access: LUE AVF    Basic Metabolic Panel:  Recent Labs Lab 12/15/14 1052  NA 133*  K 4.3  CL 93*  CO2 28  GLUCOSE 321*  BUN 42*  CREATININE 3.56*  CALCIUM 8.1*    Liver Function Tests: No results for input(s): AST, ALT, ALKPHOS, BILITOT, PROT, ALBUMIN in the last 168 hours. No results for input(s): LIPASE, AMYLASE in the last 168 hours. No results for input(s): AMMONIA in the last 168 hours.  CBC:  Recent Labs Lab 12/11/14 0346 12/12/14 0514 12/15/14 0457 12/15/14 1052  WBC 6.2 7.9 6.0 6.9  NEUTROABS  --   --   --  6.2  HGB 9.6* 10.3* 9.8* 9.6*  HCT 30.9* 31.8* 31.4* 30.9*  MCV  86.4 85.8 84.6 84.7  PLT 104* 123* 94* 99*    Cardiac Enzymes: No results for input(s): CKTOTAL, CKMB, CKMBINDEX, TROPONINI in the last 168 hours.  BNP: Invalid input(s): POCBNP  CBG:  Recent Labs Lab 12/16/14 1104 12/16/14 1619 12/16/14 2123 12/17/14 0730 12/17/14 1451  GLUCAP 447* 287* 308* 260* 126*    Microbiology: Results for orders placed or performed during the hospital encounter of 10/01/14  Blood culture (routine x 2)     Status: None   Collection Time: 10/01/14  4:40 AM  Result Value Ref Range Status   Specimen Description BLOOD RIGHT THUMB  Final   Special Requests BOTTLES DRAWN AEROBIC AND ANAEROBIC 2ML  Final   Culture NO GROWTH 5 DAYS  Final   Report Status 10/06/2014 FINAL  Final  Blood culture (routine x 2)     Status: None   Collection Time: 10/01/14  4:40 AM  Result Value Ref Range Status   Specimen Description BLOOD RIGHT HAND  Final   Special Requests BOTTLES DRAWN AEROBIC AND ANAEROBIC 2ML  Final   Culture NO GROWTH 5 DAYS  Final   Report Status 10/06/2014 FINAL  Final  MRSA PCR Screening     Status: None   Collection Time: 10/01/14  8:47 AM  Result Value Ref Range Status   MRSA  by PCR NEGATIVE NEGATIVE Final    Comment:        The GeneXpert MRSA Assay (FDA approved for NASAL specimens only), is one component of a comprehensive MRSA colonization surveillance program. It is not intended to diagnose MRSA infection nor to guide or monitor treatment for MRSA infections.     Coagulation Studies: No results for input(s): LABPROT, INR in the last 72 hours.  Urinalysis: No results for input(s): COLORURINE, LABSPEC, PHURINE, GLUCOSEU, HGBUR, BILIRUBINUR, KETONESUR, PROTEINUR, UROBILINOGEN, NITRITE, LEUKOCYTESUR in the last 72 hours.  Invalid input(s): APPERANCEUR    Imaging: No results found.   Medications:     . aspirin EC  81 mg Oral Daily  . atorvastatin  80 mg Oral QHS  . budesonide (PULMICORT) nebulizer solution  0.25 mg  Nebulization BID  . calcium acetate  667 mg Oral TID WC  . cefTRIAXone (ROCEPHIN)  IV  1 g Intravenous Q24H  . clopidogrel  75 mg Oral Daily  . docusate sodium  100 mg Oral QHS  . doxycycline  100 mg Oral Q12H  . epoetin (EPOGEN/PROCRIT) injection  10,000 Units Intravenous Q M,W,F-HD  . famotidine  10 mg Oral Daily  . feeding supplement (NEPRO CARB STEADY)  237 mL Oral TID BM  . furosemide  40 mg Intravenous Once  . heparin  5,000 Units Subcutaneous 3 times per day  . insulin aspart  0-15 Units Subcutaneous TID WC  . insulin aspart  0-5 Units Subcutaneous QHS  . insulin glargine  55 Units Subcutaneous BID  . isosorbide mononitrate  60 mg Oral Daily  . levothyroxine  75 mcg Oral QAC breakfast  . loratadine  10 mg Oral Daily  . methylPREDNISolone (SOLU-MEDROL) injection  40 mg Intravenous 3 times per day  . metoprolol tartrate  12.5 mg Oral BID  . nystatin  5 mL Oral QID  . pantoprazole  40 mg Oral BID  . polyethylene glycol  17 g Oral Daily  . pregabalin  75 mg Oral QHS  . rOPINIRole  2 mg Oral QHS  . sodium chloride  3 mL Intravenous Q12H  . sodium chloride HYPERTONIC  5 mL Nebulization Daily  . traZODone  50 mg Oral QHS  . venlafaxine XR  37.5 mg Oral QHS   sodium chloride, sodium chloride, acetaminophen **OR** acetaminophen, alteplase, benzonatate, bisacodyl, heparin, ipratropium-albuterol, lidocaine (PF), lidocaine-prilocaine, morphine injection, nitroGLYCERIN, ondansetron **OR** ondansetron (ZOFRAN) IV, oxyCODONE, pentafluoroprop-tetrafluoroeth, sodium chloride  Assessment/ Plan:  74 y.o. female with end-stage renal disease, carotid stenosis, coronary artery disease, diabetes type 2, history of myocardial infarction, hypertension, peripheral vascular disease  1.  ESRD on HD MWF:   - Pt due for HD today, orders prepared, continue HD on MWF schedule.   2.  Anemia of CKD:  hgb 9.6, continue epogen 10000 units IV with HD.   3.  SHPTH:  - check phos today, continue PhosLo 667  mg by mouth 3 times a day with meals.  4.  Shortness of breath/pulmonary edema/COPD:   -  Patient has been treated for pulmonary edema, pneumonia, and underlying COPD.   -  Will check for pulmonary renal syndromes and check anca/gbms today.   LOS: 10 Shivan Hodes 11/28/20163:07 PM

## 2014-12-17 NOTE — Progress Notes (Signed)
HD tx completed.

## 2014-12-17 NOTE — Progress Notes (Signed)
Inpatient Diabetes Program Recommendations  AACE/ADA: New Consensus Statement on Inpatient Glycemic Control (2015)  Target Ranges:  Prepandial:   less than 140 mg/dL      Peak postprandial:   less than 180 mg/dL (1-2 hours)      Critically ill patients:  140 - 180 mg/dL   Review of Glycemic Control  Diabetes history: DM2 Outpatient Diabetes medications: Lantus 90 units daily, NPH 15 units BID Current orders for Inpatient glycemic control: Lantus 55 units BID, Novolog 0-15 units TID with meals, Novolog 0-5 units HS  Inpatient Diabetes Program Recommendations: Insulin - Basal: If steroids are continued as ordered (Solumedrol 40 mg TID), please consider increasing Lantus to 58 units BID. Insulin - Meal Coverage: Post prandial glucose is consistently elevated especially with steroids. Please consider ordering Novolog 8 units TID with meals for meal coverage.  Diet: Meal intake is documented as 50-100% with all meals and patient is also receiving Nepro 237 ml TID between meals. Added Carb modified to renal diet. May want to re-evaluate need for Nepro (especially given carbohydrates being consumed and fluid restrition).   Thanks, Orlando PennerMarie Aleeta Schmaltz, RN, MSN, CDE Diabetes Coordinator Inpatient Diabetes Program [phone] (Team Pager from 8am to 5pm) [phone] ([facility] office) [phone] [facility] office) [phone] [facility] office)

## 2014-12-17 NOTE — Progress Notes (Signed)
Pre-hd tx 

## 2014-12-17 NOTE — Progress Notes (Signed)
PT Cancellation Note  Patient Details Name: Vanetta ShawlLinda M Sottile MRN: [id number] DOB: 05/08/1940   Cancelled Treatment:    Reason Eval/Treat Not Completed: Patient at procedure or test/unavailable (Patient currently off unit for dialysis; will re-attempt at later time/date as patient available and medically appropriate.)   Reizel Calzada H. Manson PasseyBrown, PT, DPT, NCS 12/17/2014, 11:24 AM [phone]

## 2014-12-18 ENCOUNTER — Telehealth: Payer: Self-pay

## 2014-12-18 LAB — GLUCOSE, CAPILLARY
GLUCOSE-CAPILLARY: 163 mg/dL — AB (ref 65–99)
GLUCOSE-CAPILLARY: 212 mg/dL — AB (ref 65–99)
GLUCOSE-CAPILLARY: 246 mg/dL — AB (ref 65–99)
Glucose-Capillary: 197 mg/dL — ABNORMAL HIGH (ref 65–99)

## 2014-12-18 LAB — CBC
HEMATOCRIT: 31 % — AB (ref 35.0–47.0)
HEMOGLOBIN: 9.8 g/dL — AB (ref 12.0–16.0)
MCH: 26.1 pg (ref 26.0–34.0)
MCHC: 31.5 g/dL — ABNORMAL LOW (ref 32.0–36.0)
MCV: 82.9 fL (ref 80.0–100.0)
Platelets: 91 10*3/uL — ABNORMAL LOW (ref 150–440)
RBC: 3.74 MIL/uL — AB (ref 3.80–5.20)
RDW: 21.3 % — ABNORMAL HIGH (ref 11.5–14.5)
WBC: 6.4 10*3/uL (ref 3.6–11.0)

## 2014-12-18 LAB — PHOSPHORUS: Phosphorus: 3.1 mg/dL (ref 2.5–4.6)

## 2014-12-18 MED ORDER — PREDNISONE 20 MG PO TABS
40.0000 mg | ORAL_TABLET | Freq: Every day | ORAL | Status: DC
  Administered 2014-12-18 – 2014-12-19 (×2): 40 mg via ORAL
  Filled 2014-12-18 (×2): qty 2

## 2014-12-18 NOTE — Progress Notes (Signed)
[facility]  ROUNDING NOTE   Subjective:  Patient reports that she continued to have cough overnight. She had dialysis yesterday and tolerated this well. Weight currently 87.9 kg.  Objective:  Vital signs in last 24 hours:  Temp:  [97.7 F (36.5 C)-98.9 F (37.2 C)] 98.2 F (36.8 C) (11/29 0501) Pulse Rate:  [73-99] 79 (11/29 0501) Resp:  [14-21] 20 (11/29 0501) BP: (80-126)/(25-78) 111/49 mmHg (11/29 0501) SpO2:  [94 %-99 %] 98 % (11/29 0501) Weight:  [86.9 kg (191 lb 9.3 oz)-88.9 kg (195 lb 15.8 oz)] 87.952 kg (193 lb 14.4 oz) (11/29 0500)  Weight change: -0.119 kg (-4.2 oz) Filed Weights   12/17/14 1015 12/17/14 1413 12/18/14 0500  Weight: 88.9 kg (195 lb 15.8 oz) 86.9 kg (191 lb 9.3 oz) 87.952 kg (193 lb 14.4 oz)    Intake/Output: I/O last 3 completed shifts: In: 240 [P.O.:240] Out: 2000 [Other:2000]   Intake/Output this shift:        Physical Exam: General: NAD, sitting up in bed  Head: Normocephalic, atraumatic. Moist oral mucosal membranes  Eyes: Anicteric  Neck: Supple, trachea midline  Lungs:  Bilateral rhonchi noted, normal effort   Heart: S1S2 no rubs  Abdomen:  Soft, nontender, BS present   Extremities:  trace+ peripheral edema.  Neurologic: Nonfocal, moving all four extremities  Skin: No lesions  Access: LUE AVF    Basic Metabolic Panel:  Recent Labs Lab 12/15/14 1052 12/18/14 0437  NA 133*  --   K 4.3  --   CL 93*  --   CO2 28  --   GLUCOSE 321*  --   BUN 42*  --   CREATININE 3.56*  --   CALCIUM 8.1*  --   PHOS  --  3.1    Liver Function Tests: No results for input(s): AST, ALT, ALKPHOS, BILITOT, PROT, ALBUMIN in the last 168 hours. No results for input(s): LIPASE, AMYLASE in the last 168 hours. No results for input(s): AMMONIA in the last 168 hours.  CBC:  Recent Labs Lab 12/12/14 0514 12/15/14 0457 12/15/14 1052 12/18/14 0437  WBC 7.9 6.0 6.9 6.4  NEUTROABS  --   --  6.2  --   HGB 10.3* 9.8* 9.6* 9.8*   HCT 31.8* 31.4* 30.9* 31.0*  MCV 85.8 84.6 84.7 82.9  PLT 123* 94* 99* 91*    Cardiac Enzymes: No results for input(s): CKTOTAL, CKMB, CKMBINDEX, TROPONINI in the last 168 hours.  BNP: Invalid input(s): POCBNP  CBG:  Recent Labs Lab 12/17/14 0730 12/17/14 1451 12/17/14 1627 12/17/14 2122 12/18/14 0741  GLUCAP 260* 126* 163* 306* 163*    Microbiology: Results for orders placed or performed during the hospital encounter of 10/01/14  Blood culture (routine x 2)     Status: None   Collection Time: 10/01/14  4:40 AM  Result Value Ref Range Status   Specimen Description BLOOD RIGHT THUMB  Final   Special Requests BOTTLES DRAWN AEROBIC AND ANAEROBIC 2ML  Final   Culture NO GROWTH 5 DAYS  Final   Report Status 10/06/2014 FINAL  Final  Blood culture (routine x 2)     Status: None   Collection Time: 10/01/14  4:40 AM  Result Value Ref Range Status   Specimen Description BLOOD RIGHT HAND  Final   Special Requests BOTTLES DRAWN AEROBIC AND ANAEROBIC 2ML  Final   Culture NO GROWTH 5 DAYS  Final   Report Status 10/06/2014 FINAL  Final  MRSA PCR Screening  Status: None   Collection Time: 10/01/14  8:47 AM  Result Value Ref Range Status   MRSA by PCR NEGATIVE NEGATIVE Final    Comment:        The GeneXpert MRSA Assay (FDA approved for NASAL specimens only), is one component of a comprehensive MRSA colonization surveillance program. It is not intended to diagnose MRSA infection nor to guide or monitor treatment for MRSA infections.     Coagulation Studies: No results for input(s): LABPROT, INR in the last 72 hours.  Urinalysis: No results for input(s): COLORURINE, LABSPEC, PHURINE, GLUCOSEU, HGBUR, BILIRUBINUR, KETONESUR, PROTEINUR, UROBILINOGEN, NITRITE, LEUKOCYTESUR in the last 72 hours.  Invalid input(s): APPERANCEUR    Imaging: No results found.   Medications:     . aspirin EC  81 mg Oral Daily  . atorvastatin  80 mg Oral QHS  . budesonide (PULMICORT)  nebulizer solution  0.25 mg Nebulization BID  . calcium acetate  667 mg Oral TID WC  . cefTRIAXone (ROCEPHIN)  IV  1 g Intravenous Q24H  . clopidogrel  75 mg Oral Daily  . docusate sodium  100 mg Oral QHS  . doxycycline  100 mg Oral Q12H  . epoetin (EPOGEN/PROCRIT) injection  10,000 Units Intravenous Q M,W,F-HD  . famotidine  10 mg Oral Daily  . feeding supplement (NEPRO CARB STEADY)  237 mL Oral TID BM  . furosemide  40 mg Intravenous Once  . heparin  5,000 Units Subcutaneous 3 times per day  . insulin aspart  0-15 Units Subcutaneous TID WC  . insulin aspart  0-5 Units Subcutaneous QHS  . insulin glargine  55 Units Subcutaneous BID  . isosorbide mononitrate  60 mg Oral Daily  . levothyroxine  75 mcg Oral QAC breakfast  . loratadine  10 mg Oral Daily  . methylPREDNISolone (SOLU-MEDROL) injection  40 mg Intravenous 3 times per day  . metoprolol tartrate  12.5 mg Oral BID  . nystatin  5 mL Oral QID  . pantoprazole  40 mg Oral BID  . polyethylene glycol  17 g Oral Daily  . pregabalin  75 mg Oral QHS  . rOPINIRole  2 mg Oral QHS  . sodium chloride  3 mL Intravenous Q12H  . sodium chloride HYPERTONIC  5 mL Nebulization Daily  . traZODone  50 mg Oral QHS  . venlafaxine XR  37.5 mg Oral QHS   sodium chloride, sodium chloride, acetaminophen **OR** acetaminophen, alteplase, benzonatate, bisacodyl, heparin, ipratropium-albuterol, lidocaine (PF), lidocaine-prilocaine, morphine injection, nitroGLYCERIN, ondansetron **OR** ondansetron (ZOFRAN) IV, oxyCODONE, pentafluoroprop-tetrafluoroeth, sodium chloride  Assessment/ Plan:  74 y.o. female with end-stage renal disease, carotid stenosis, coronary artery disease, diabetes type 2, history of myocardial infarction, hypertension, peripheral vascular disease  1.  ESRD on HD MWF:   - Pt due for HD today, orders prepared, continue HD on MWF schedule.   2.  Anemia of CKD:  hgb 9.6, continue epogen 10000 units IV with HD.   3.  SHPTH:  - phos  acceptable at 3.1, continue phoslo.  4.  Shortness of breath/pulmonary edema/COPD:   -  Patient has been treated for pulmonary edema, pneumonia, and underlying COPD.   -  Awaiting ANCA and GBM antibody testing.   LOS: 11 Joanette Silveria 11/29/20167:48 AM

## 2014-12-18 NOTE — Care Management (Signed)
Patient admitted from home with Acute on chronic systolic congestive heart failure.  Patient lives at home with her husband.  Patient's adult children live locally for support.  Patient states that she has a shower chair, shower handles, walker and wheelchair in the home.  [facility].  PT is recommending home health PT.  [facility] has been notified of PT requests.  Will work with patient on and outpatient basis.  [facility] provide patient with transportation.  Patient is and HD patient with her regular days being MWF

## 2014-12-18 NOTE — Progress Notes (Signed)
Patient ID: Savannah Key, female   DOB: 12/23/1940, 74 y.o.   MRN: [id number] [facility] PRO[facility]  PCP: Bobbye MortonSHARON A REILLY, MD  HPI/Subjective: Patient states she feels better than coming in. She still having wheeze. Still with coughing fits. Still doesn't feel well.  Objective: Filed Vitals:   12/18/14 0820 12/18/14 1122  BP: 123/62 93/48  Pulse: 82 86  Temp: 98.1 F (36.7 C) 97.3 F (36.3 C)  Resp: 19 18    Filed Weights   12/17/14 1015 12/17/14 1413 12/18/14 0500  Weight: 88.9 kg (195 lb 15.8 oz) 86.9 kg (191 lb 9.3 oz) 87.952 kg (193 lb 14.4 oz)    ROS: Review of Systems  Constitutional: Negative for fever and chills.  Eyes: Negative for blurred vision.  Respiratory: Positive for cough, shortness of breath and wheezing.   Cardiovascular: Negative for chest pain.  Gastrointestinal: Negative for nausea, vomiting, abdominal pain, diarrhea and constipation.  Genitourinary: Negative for dysuria.  Musculoskeletal: Negative for joint pain.  Neurological: Negative for dizziness and headaches.   Exam: Physical Exam  Constitutional: She is oriented to person, place, and time.  HENT:  Nose: No mucosal edema.  Mouth/Throat: No oropharyngeal exudate or posterior oropharyngeal edema.  Eyes: Conjunctivae, EOM and lids are normal. Pupils are equal, round, and reactive to light.  Neck: No JVD present. Carotid bruit is not present. No edema present. No thyroid mass and no thyromegaly present.  Cardiovascular: S1 normal and S2 normal.  Exam reveals no gallop.   No murmur heard. Pulses:      Dorsalis pedis pulses are 2+ on the right side, and 2+ on the left side.  Respiratory: No respiratory distress. She has decreased breath sounds in the right lower field and the left lower field. She has wheezes in the right lower field and the left lower field. She has no rhonchi. She has no rales.  GI: Soft. Bowel sounds are normal. There is no tenderness.  Musculoskeletal:     Right ankle: She exhibits swelling.       Left ankle: She exhibits swelling.  Lymphadenopathy:    She has no cervical adenopathy.  Neurological: She is alert and oriented to person, place, and time. No cranial nerve deficit.  Skin: Skin is warm. No rash noted. Nails show no clubbing.  Psychiatric: She has a normal mood and affect.    Data Reviewed: Basic Metabolic Panel:  Recent Labs Lab 12/15/14 1052 12/18/14 0437  NA 133*  --   K 4.3  --   CL 93*  --   CO2 28  --   GLUCOSE 321*  --   BUN 42*  --   CREATININE 3.56*  --   CALCIUM 8.1*  --   PHOS  --  3.1   CBC:  Recent Labs Lab 12/12/14 0514 12/15/14 0457 12/15/14 1052 12/18/14 0437  WBC 7.9 6.0 6.9 6.4  NEUTROABS  --   --  6.2  --   HGB 10.3* 9.8* 9.6* 9.8*  HCT 31.8* 31.4* 30.9* 31.0*  MCV 85.8 84.6 84.7 82.9  PLT 123* 94* 99* 91*    CBG:  Recent Labs Lab 12/17/14 1451 12/17/14 1627 12/17/14 2122 12/18/14 0741 12/18/14 1150  GLUCAP 126* 163* 306* 163* 197*     Scheduled Meds: . aspirin EC  81 mg Oral Daily  . atorvastatin  80 mg Oral QHS  . budesonide (PULMICORT) nebulizer solution  0.25 mg Nebulization BID  . calcium acetate  667 mg Oral TID  WC  . cefTRIAXone (ROCEPHIN)  IV  1 g Intravenous Q24H  . clopidogrel  75 mg Oral Daily  . docusate sodium  100 mg Oral QHS  . doxycycline  100 mg Oral Q12H  . epoetin (EPOGEN/PROCRIT) injection  10,000 Units Intravenous Q M,W,F-HD  . famotidine  10 mg Oral Daily  . feeding supplement (NEPRO CARB STEADY)  237 mL Oral TID BM  . furosemide  40 mg Intravenous Once  . heparin  5,000 Units Subcutaneous 3 times per day  . insulin aspart  0-15 Units Subcutaneous TID WC  . insulin aspart  0-5 Units Subcutaneous QHS  . insulin glargine  55 Units Subcutaneous BID  . isosorbide mononitrate  60 mg Oral Daily  . levothyroxine  75 mcg Oral QAC breakfast  . loratadine  10 mg Oral Daily  . metoprolol tartrate  12.5 mg Oral BID  . nystatin  5 mL Oral QID  .  pantoprazole  40 mg Oral BID  . polyethylene glycol  17 g Oral Daily  . predniSONE  40 mg Oral Q breakfast  . pregabalin  75 mg Oral QHS  . rOPINIRole  2 mg Oral QHS  . sodium chloride  3 mL Intravenous Q12H  . sodium chloride HYPERTONIC  5 mL Nebulization Daily  . traZODone  50 mg Oral QHS  . venlafaxine XR  37.5 mg Oral QHS    Assessment/Plan:  1. Asthmatic bronchitis- patient is on Rocephin and doxycycline. Switch Solu-Medrol over to prednisone DuoNeb nebulizers,  budesonide nebulizer. Potential discharge tomorrow after dialysis 2. Acute on chronic systolic congestive heart failure- the patient has been dialyzed without relief of symptoms. Likely more lung related than fluid related. Patient is on metoprolol and Lasix. 3. End-stage renal disease on hemodialysis- hemodialysis as per nephrology 4. Hypothyroidism unspecified continue levothyroxine 5. Gastroesophageal reflux disease without esophagitis continue Protonix 6. Cirrhosis seen on imaging of the liver 7. Type 2 diabetes with hyperglycemia secondary to steroids and Lantus to 55 units subcutaneous injection twice a day. Increase the sliding scale coverage. 8. Hyperlipidemia unspecified continue atorvastatin 9. Depression- no changes in psychiatric medications 10. Thrush nystatin swish and swallow 11. Constipation- MiraLAX added  Code Status:     Code Status Orders        Start     Ordered   12/07/14 2212  Full code   Continuous     12/07/14 2211     Disposition Plan: Home when breathing better. Spoke with daughter on phone today  Time spent: 20 minutes  Alford Highland  [facility] [place]

## 2014-12-18 NOTE — Telephone Encounter (Signed)
Called and spoke to pt's spouse, pt still hospitalized.   [facility] to follow up on.

## 2014-12-18 NOTE — Telephone Encounter (Signed)
-----   Message from Shane CrutchPradeep Ramachandran, MD sent at 12/17/2014  7:33 PM EST ----- Regarding: follow up Pt needs follow up in 2-3 months with Dr. Dema SeverinMungal.

## 2014-12-18 NOTE — Progress Notes (Signed)
PT Cancellation Note  Patient Details Name: Savannah ShawlLinda M Key MRN: [id number] DOB: 05/18/1940   Cancelled Treatment:    Reason Eval/Treat Not Completed: Patient declined, no reason specified. Treatment attempted; pt notes she has just walked around the nurses station from her room and back. Pt wishes no further PT at this time and states she will call her aid if she wants to walk later this evening. Pt has no voiced complaints other than some "wheezing in her throat" and is hoping she can go home tomorrow. Will attempt treatment tomorrow as schedule allows.     Elsie StainHeidi Elizabeth Bishop 12/18/2014, 3:27 PM

## 2014-12-18 NOTE — Progress Notes (Signed)
Inpatient Diabetes Program Recommendations  AACE/ADA: New Consensus Statement on Inpatient Glycemic Control (2015)  Target Ranges:  Prepandial:   less than 140 mg/dL      Peak postprandial:   less than 180 mg/dL (1-2 hours)      Critically ill patients:  140 - 180 mg/dL  Results for [name], Suhaila M (MRN [id number]) as of 12/18/2014 09:06  Ref. Range 12/17/2014 07:30 12/17/2014 14:51 12/17/2014 16:27 12/17/2014 21:22 12/18/2014 07:41  Glucose-Capillary Latest Ref Range: 65-99 mg/dL 409260 (H) 811126 (H) 914163 (H) 306 (H) 163 (H)   Review of Glycemic Control  Diabetes history: DM2 Outpatient Diabetes medications: Lantus 90 units daily, NPH 15 units BID Current orders for Inpatient glycemic control: Lantus 55 units BID, Novolog 0-15 units TID with meals, Novolog 0-5 units HS  Inpatient Diabetes Program Recommendations: Insulin - Basal: If steroids are continued as ordered (Solumedrol 40 mg TID), please consider increasing Lantus to 57 units BID. Insulin - Meal Coverage: Patient received hemodialysis yesterday and did not eat lunch yesterday. Post prandial glucose is consistently elevated especially with steroids. Please consider ordering Novolog 6 units TID with meals for meal coverage.  Diet:  May want to re-evaluate need for Nepro (especially given carbohydrates being consumed and fluid restrition).  Thanks, Orlando PennerMarie Lemmie Steinhaus, RN, MSN, CDE Diabetes Coordinator Inpatient Diabetes Program [phone] (Team Pager from 8am to 5pm) [phone] ([facility] office) [phone] [facility] office) [phone] [facility] office)

## 2014-12-19 LAB — CBC
HCT: 31.1 % — ABNORMAL LOW (ref 35.0–47.0)
Hemoglobin: 9.7 g/dL — ABNORMAL LOW (ref 12.0–16.0)
MCH: 25.7 pg — AB (ref 26.0–34.0)
MCHC: 31.1 g/dL — AB (ref 32.0–36.0)
MCV: 82.6 fL (ref 80.0–100.0)
PLATELETS: 101 10*3/uL — AB (ref 150–440)
RBC: 3.77 MIL/uL — ABNORMAL LOW (ref 3.80–5.20)
RDW: 21.3 % — AB (ref 11.5–14.5)
WBC: 7.8 10*3/uL (ref 3.6–11.0)

## 2014-12-19 LAB — PHOSPHORUS: Phosphorus: 4.5 mg/dL (ref 2.5–4.6)

## 2014-12-19 LAB — ANA W/REFLEX IF POSITIVE: Anti Nuclear Antibody(ANA): NEGATIVE

## 2014-12-19 LAB — GLUCOSE, CAPILLARY: Glucose-Capillary: 206 mg/dL — ABNORMAL HIGH (ref 65–99)

## 2014-12-19 LAB — MPO/PR-3 (ANCA) ANTIBODIES
ANCA Proteinase 3: <3.5 U/mL (ref 0.0–3.5)
Myeloperoxidase Abs: <9 U/mL (ref 0.0–9.0)

## 2014-12-19 MED ORDER — DOXYCYCLINE HYCLATE 100 MG PO TABS: 100.0000 mg | ORAL_TABLET | Freq: Two times a day (BID) | ORAL | Status: DC

## 2014-12-19 MED ORDER — METOPROLOL TARTRATE 25 MG PO TABS: 12.5000 mg | ORAL_TABLET | Freq: Two times a day (BID) | ORAL | Status: DC

## 2014-12-19 MED ORDER — INSULIN GLARGINE 100 UNIT/ML ~~LOC~~ SOLN: 45.0000 [IU] | Freq: Two times a day (BID) | SUBCUTANEOUS | Status: AC

## 2014-12-19 MED ORDER — BUDESONIDE 0.25 MG/2ML IN SUSP: 0.2500 mg | Freq: Two times a day (BID) | RESPIRATORY_TRACT | Status: AC

## 2014-12-19 MED ORDER — INSULIN GLARGINE 100 UNIT/ML ~~LOC~~ SOLN
50.0000 [IU] | Freq: Two times a day (BID) | SUBCUTANEOUS | Status: DC
  Administered 2014-12-19: 50 [IU] via SUBCUTANEOUS
  Filled 2014-12-19 (×2): qty 0.5

## 2014-12-19 MED ORDER — NYSTATIN 100000 UNIT/ML MT SUSP: 5.0000 mL | Freq: Four times a day (QID) | OROMUCOSAL | Status: DC

## 2014-12-19 MED ORDER — PREDNISONE 5 MG PO TABS: ORAL_TABLET | ORAL | Status: DC

## 2014-12-19 MED ORDER — IPRATROPIUM-ALBUTEROL 0.5-2.5 (3) MG/3ML IN SOLN: 3.0000 mL | RESPIRATORY_TRACT | Status: AC | PRN

## 2014-12-19 NOTE — Progress Notes (Signed)
Pre-hd tx 

## 2014-12-19 NOTE — Progress Notes (Signed)
Pt is a&o, VSS, Afib on tele with no complaints of pain or discomfort. Order to discharge pt to home post dialysis. Pt went to dialysis and 2L removed. Discharge instructions give to pt along with prescriptions. Verbal acknowledgment of understanding. IV and tele removed and pt awaiting volunteer services for d/c.

## 2014-12-19 NOTE — Progress Notes (Signed)
HD tx started  

## 2014-12-19 NOTE — Telephone Encounter (Signed)
Spoke with pt's husband to setup appt for 2-3 months out. He states he would rather have her do it. Informed to have pt to call to schedule. Will await call.

## 2014-12-19 NOTE — Progress Notes (Signed)
[facility]  ROUNDING NOTE   Subjective:  Patient seen and evaluated during dialysis today. Blood flow rate 400. Overall shortness of breath continues to improve. Ultrafiltration target 2 kg.  Objective:  Vital signs in last 24 hours:  Temp:  [97.3 F (36.3 C)-98.2 F (36.8 C)] 97.9 F (36.6 C) (11/30 0930) Pulse Rate:  [57-116] 87 (11/30 1030) Resp:  [16-21] 20 (11/30 1030) BP: (93-152)/(48-106) 117/98 mmHg (11/30 1030) SpO2:  [94 %-100 %] 98 % (11/30 1030) Weight:  [89.268 kg (196 lb 12.8 oz)-90 kg (198 lb 6.6 oz)] 90 kg (198 lb 6.6 oz) (11/30 0930)  Weight change: 0.368 kg (13 oz) Filed Weights   12/18/14 0500 12/19/14 0425 12/19/14 0930  Weight: 87.952 kg (193 lb 14.4 oz) 89.268 kg (196 lb 12.8 oz) 90 kg (198 lb 6.6 oz)    Intake/Output: I/O last 3 completed shifts: In: 74 [P.O.:840; IV Piggyback:50] Out: 0    Intake/Output this shift:        Physical Exam: General: NAD, sitting up in bed  Head: Normocephalic, atraumatic. Moist oral mucosal membranes  Eyes: Anicteric  Neck: Supple, trachea midline  Lungs:  Good air entry, CTAB, normal effort   Heart: S1S2 no rubs  Abdomen:  Soft, nontender, BS present   Extremities:  trace+ peripheral edema.  Neurologic: Nonfocal, moving all four extremities  Skin: No lesions  Access: LUE AVF    Basic Metabolic Panel:  Recent Labs Lab 12/15/14 1052 12/18/14 0437  NA 133*  --   K 4.3  --   CL 93*  --   CO2 28  --   GLUCOSE 321*  --   BUN 42*  --   CREATININE 3.56*  --   CALCIUM 8.1*  --   PHOS  --  3.1    Liver Function Tests: No results for input(s): AST, ALT, ALKPHOS, BILITOT, PROT, ALBUMIN in the last 168 hours. No results for input(s): LIPASE, AMYLASE in the last 168 hours. No results for input(s): AMMONIA in the last 168 hours.  CBC:  Recent Labs Lab 12/15/14 0457 12/15/14 1052 12/18/14 0437 12/19/14 0407  WBC 6.0 6.9 6.4 7.8  NEUTROABS  --  6.2  --   --   HGB 9.8* 9.6* 9.8*  9.7*  HCT 31.4* 30.9* 31.0* 31.1*  MCV 84.6 84.7 82.9 82.6  PLT 94* 99* 91* 101*    Cardiac Enzymes: No results for input(s): CKTOTAL, CKMB, CKMBINDEX, TROPONINI in the last 168 hours.  BNP: Invalid input(s): POCBNP  CBG:  Recent Labs Lab 12/18/14 0741 12/18/14 1150 12/18/14 1714 12/18/14 2144 12/19/14 0734  GLUCAP 163* 197* 212* 49* 206*    Microbiology: Results for orders placed or performed during the hospital encounter of 10/01/14  Blood culture (routine x 2)     Status: None   Collection Time: 10/01/14  4:40 AM  Result Value Ref Range Status   Specimen Description BLOOD RIGHT THUMB  Final   Special Requests BOTTLES DRAWN AEROBIC AND ANAEROBIC 2ML  Final   Culture NO GROWTH 5 DAYS  Final   Report Status 10/06/2014 FINAL  Final  Blood culture (routine x 2)     Status: None   Collection Time: 10/01/14  4:40 AM  Result Value Ref Range Status   Specimen Description BLOOD RIGHT HAND  Final   Special Requests BOTTLES DRAWN AEROBIC AND ANAEROBIC 2ML  Final   Culture NO GROWTH 5 DAYS  Final   Report Status 10/06/2014 FINAL  Final  MRSA PCR  Screening     Status: None   Collection Time: 10/01/14  8:47 AM  Result Value Ref Range Status   MRSA by PCR NEGATIVE NEGATIVE Final    Comment:        The GeneXpert MRSA Assay (FDA approved for NASAL specimens only), is one component of a comprehensive MRSA colonization surveillance program. It is not intended to diagnose MRSA infection nor to guide or monitor treatment for MRSA infections.     Coagulation Studies: No results for input(s): LABPROT, INR in the last 72 hours.  Urinalysis: No results for input(s): COLORURINE, LABSPEC, PHURINE, GLUCOSEU, HGBUR, BILIRUBINUR, KETONESUR, PROTEINUR, UROBILINOGEN, NITRITE, LEUKOCYTESUR in the last 72 hours.  Invalid input(s): APPERANCEUR    Imaging: No results found.   Medications:     . aspirin EC  81 mg Oral Daily  . atorvastatin  80 mg Oral QHS  . budesonide  (PULMICORT) nebulizer solution  0.25 mg Nebulization BID  . calcium acetate  667 mg Oral TID WC  . cefTRIAXone (ROCEPHIN)  IV  1 g Intravenous Q24H  . clopidogrel  75 mg Oral Daily  . docusate sodium  100 mg Oral QHS  . doxycycline  100 mg Oral Q12H  . epoetin (EPOGEN/PROCRIT) injection  10,000 Units Intravenous Q M,W,F-HD  . famotidine  10 mg Oral Daily  . feeding supplement (NEPRO CARB STEADY)  237 mL Oral TID BM  . furosemide  40 mg Intravenous Once  . heparin  5,000 Units Subcutaneous 3 times per day  . insulin aspart  0-15 Units Subcutaneous TID WC  . insulin aspart  0-5 Units Subcutaneous QHS  . insulin glargine  50 Units Subcutaneous BID  . isosorbide mononitrate  60 mg Oral Daily  . levothyroxine  75 mcg Oral QAC breakfast  . loratadine  10 mg Oral Daily  . nystatin  5 mL Oral QID  . pantoprazole  40 mg Oral BID  . polyethylene glycol  17 g Oral Daily  . predniSONE  40 mg Oral Q breakfast  . pregabalin  75 mg Oral QHS  . rOPINIRole  2 mg Oral QHS  . sodium chloride  3 mL Intravenous Q12H  . sodium chloride HYPERTONIC  5 mL Nebulization Daily  . traZODone  50 mg Oral QHS  . venlafaxine XR  37.5 mg Oral QHS   sodium chloride, sodium chloride, acetaminophen **OR** acetaminophen, alteplase, benzonatate, bisacodyl, heparin, ipratropium-albuterol, lidocaine (PF), lidocaine-prilocaine, morphine injection, nitroGLYCERIN, ondansetron **OR** ondansetron (ZOFRAN) IV, oxyCODONE, pentafluoroprop-tetrafluoroeth, sodium chloride  Assessment/ Plan:  74 y.o. female with end-stage renal disease, carotid stenosis, coronary artery disease, diabetes type 2, history of myocardial infarction, hypertension, peripheral vascular disease  1.  ESRD on HD MWF:   - Patient seen during dialysis today. Blood flow rate 400. Ultrafiltration target 2 kg today.  2.  Anemia of CKD:  continue Epogen 10,000 units IV with dialysis while here. She will resume erythropoietin stimulating agent therapy as an  outpatient .   3.  SHPTH:  - Maintain the patient on the current dosage of PhosLo upon discharge.  4.  Shortness of breath/pulmonary edema/COPD:   -  Patient has been treated for pulmonary edema, pneumonia, and underlying COPD.   -  Awaiting ANCA and GBM antibody testing.   LOS: 12 Casen Pryor 11/30/201610:37 AM

## 2014-12-19 NOTE — Care Management Important Message (Signed)
Important Message  Patient Details  Name: Savannah Key MRN: [id number] Date of Birth: 07/22/1940   Medicare Important Message Given:  Yes    Olegario MessierKathy A Samwise Eckardt 12/19/2014, 10:43 AM

## 2014-12-19 NOTE — Discharge Summary (Signed)
[facility] - [place] at [facility]   PATIENT NAME: Savannah Key    MR#:  [id number]  DATE OF BIRTH:  Jul 11, 1940  DATE OF ADMISSION:  12/07/2014 ADMITTING PHYSICIAN: Wyatt Haste, MD  DATE OF DISCHARGE: 12/19/2014  PRIMARY CARE PHYSICIAN: Bobbye Morton, MD    ADMISSION DIAGNOSIS:  Acute on chronic diastolic congestive heart failure (HCC) [I50.33] Chest pain, unspecified chest pain type [R07.9]  DISCHARGE DIAGNOSIS:  Principal Problem:   Acute on chronic systolic (congestive) heart failure (HCC)   SECONDARY DIAGNOSIS:   Past Medical History  Diagnosis Date  . Chronic kidney disease   . GERD (gastroesophageal reflux disease)   . Carotid artery stenosis   . Coronary artery disease   . Hypothyroidism   . Diabetes mellitus without complication (HCC)   . Depression   . Arthritis   . Kidney failure   . Liver failure (HCC)   . MI (myocardial infarction) (HCC)   . HTN (hypertension)   . Broken leg     R  . CHF (congestive heart failure) (HCC)     HOSPITAL COURSE:   1. Asthmatic bronchitis. Patient took a long time to break. She is moving better air. Still with some cough and a little upper airway wheeze but improved since admission. We gave Rocephin the entire hospital course she finished course of Zithromax. It was not until I switched over to doxycycline that we saw some improvement. Repeat CT scan needed and in 3 months to ensure clearing of infectious infiltrate seen on CT scan. 2. Acute on chronic systolic congestive heart failure. Initially this was the thought on admission that this was the issue. When she was dialyzed almost on a daily basis without relief of her symptoms. We believe this is more lung related than heart related. Because of her bronchospasm beta blocker was stopped. Dialysis to help out with fluid management. 3. End-stage renal disease on hemodialysis. Hemodialysis today. And discharge afterwards. 4. Hypothyroidism  unspecified continue levothyroxine 5. Gastroesophageal reflux disease without esophagitis continue Protonix 6. Cirrhosis seen on imaging of the liver 7. 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 Patient will go back to her Lantus but divided doses of 45 units twice a day 8. Hyperlipidemia unspecified continue atorvastatin 9. Depression no changes in psychiatric medication 10. Thrush nystatin swish and swallow 11 constipation- had bowel movement yesterday    DISCHARGE CONDITIONS:   Satisfactory  CONSULTS OBTAINED:  Treatment Team:  Wyatt Haste, MD Lamar Blinks, MD Alwyn Pea, MD [place] Cherylann Ratel, MD Mertie Moores, MD  DRUG ALLERGIES:   Allergies  Allergen Reactions  . Sulfa Antibiotics Other (See Comments)    Reaction:  Blood in the urine     DISCHARGE MEDICATIONS:   Current Discharge Medication List    START taking these medications   Details  budesonide (PULMICORT) 0.25 MG/2ML nebulizer solution Take 2 mLs (0.25 mg total) by nebulization 2 (two) times daily. Qty: 60 mL, Refills: 12    doxycycline (VIBRA-TABS) 100 MG tablet Take 1 tablet (100 mg total) by mouth every 12 (twelve) hours. Qty: 20 tablet, Refills: 0    ipratropium-albuterol (DUONEB) 0.5-2.5 (3) MG/3ML SOLN Take 3 mLs by nebulization every 4 (four) hours as needed. Qty: 360 mL, Refills: 0    nystatin (MYCOSTATIN) 100000 UNIT/ML suspension Take 5 mLs (500,000 Units total) by mouth 4 (four) times daily. Qty: 60 mL, Refills: 0    predniSONE (DELTASONE) 5 MG tablet 7 tabs day 1;  6 tabs day2; 5 tabs day3; 4 tabs day4; 3 tabs day5; 2 tabs day6; 1 tab day7,8 Qty: 29 tablet, Refills: 0      CONTINUE these medications which have CHANGED   Details  insulin glargine (LANTUS) 100 UNIT/ML injection Inject 0.45 mLs (45 Units total) into the skin 2 (two) times daily. Qty: 10 mL, Refills: 11      CONTINUE these medications which have NOT CHANGED   Details  aspirin EC 81 MG tablet  Take 81 mg by mouth daily.     atorvastatin (LIPITOR) 80 MG tablet Take 80 mg by mouth at bedtime.    benzonatate (TESSALON) 100 MG capsule Take 100 mg by mouth 3 (three) times daily as needed for cough.    calcium acetate (PHOSLO) 667 MG capsule Take 667 mg by mouth 3 (three) times daily with meals.    cetirizine (ZYRTEC) 5 MG tablet Take 5 mg by mouth at bedtime.     clopidogrel (PLAVIX) 75 MG tablet Take 1 tablet (75 mg total) by mouth daily. Qty: 30 tablet, Refills: 0    docusate sodium (COLACE) 100 MG capsule Take 100 mg by mouth at bedtime.    insulin NPH Human (HUMULIN N,NOVOLIN N) 100 UNIT/ML injection Inject 15 Units into the skin 3 (three) times daily before meals.     isosorbide mononitrate (IMDUR) 60 MG 24 hr tablet Take 60 mg by mouth daily.     levothyroxine (SYNTHROID, LEVOTHROID) 75 MCG tablet Take 75 mcg by mouth daily.    loperamide (IMODIUM) 2 MG capsule Take 2 mg by mouth as needed for diarrhea or loose stools.    nitroGLYCERIN (NITROSTAT) 0.4 MG SL tablet Place 0.4 mg under the tongue every 5 (five) minutes as needed for chest pain.    pantoprazole (PROTONIX) 40 MG tablet Take 40 mg by mouth 2 (two) times daily.    pregabalin (LYRICA) 75 MG capsule Take 75 mg by mouth at bedtime.    ranitidine (ZANTAC) 150 MG tablet Take 150 mg by mouth at bedtime.    rOPINIRole (REQUIP) 2 MG tablet Take 2 mg by mouth at bedtime.    sodium chloride (OCEAN) 0.65 % SOLN nasal spray Place 1 spray into both nostrils as needed for congestion.    traZODone (DESYREL) 50 MG tablet Take 50 mg by mouth at bedtime.    venlafaxine XR (EFFEXOR-XR) 37.5 MG 24 hr capsule Take 37.5 mg by mouth at bedtime.      STOP taking these medications     cephALEXin (KEFLEX) 250 MG capsule      oxyCODONE-acetaminophen (ROXICET) 5-325 MG per tablet          DISCHARGE INSTRUCTIONS:   [facility] with [facility] and paste Dr. Follow-up at the CHF clinic  If you  experience worsening of your admission symptoms, develop shortness of breath, life threatening emergency, suicidal or homicidal thoughts you must seek medical attention immediately by calling 911 or calling your MD immediately  if symptoms less severe.  You Must read complete instructions/literature along with all the possible adverse reactions/side effects for all the Medicines you take and that have been prescribed to you. Take any new Medicines after you have completely understood and accept all the possible adverse reactions/side effects.   Please note  You were cared for by a hospitalist during your hospital stay. If you have any questions about your discharge medications or the care you received while you were in the hospital after you are  discharged, you can call the unit and asked to speak with the hospitalist on call if the hospitalist that took care of you is not available. Once you are discharged, your primary care physician will handle any further medical issues. Please note that NO REFILLS for any discharge medications will be authorized once you are discharged, as it is imperative that you return to your primary care physician (or establish a relationship with a primary care physician if you do not have one) for your aftercare needs so that they can reassess your need for medications and monitor your lab values.    Today   CHIEF COMPLAINT:   Chief Complaint  Patient presents with  . Shortness of Breath    HISTORY OF PRESENT ILLNESS:  Savannah Key  is a 74 y.o. female with a known history of end-stage renal disease presented with shortness of breath. Initially thought to be secondary to heart failure but likely more related to lung disease   VITAL SIGNS:  Blood pressure 112/56, pulse 107, temperature 97.8 F (36.6 C), temperature source Oral, resp. rate 18, height 5' (1.524 m), weight 89.268 kg (196 lb 12.8 oz), SpO2 96 %.     PHYSICAL EXAMINATION:  GENERAL:  74  y.o.-year-old patient lying in the bed with no acute distress.  EYES: Pupils equal, round, reactive to light and accommodation. No scleral icterus. Extraocular muscles intact.  HEENT: Head atraumatic, normocephalic. Oropharynx and nasopharynx clear.  NECK:  Supple, no jugular venous distention. No thyroid enlargement, no tenderness.  LUNGS: Coarse breath sounds bilaterally, no wheezing, rales,rhonchi or crepitation. No use of accessory muscles of respiration.  CARDIOVASCULAR: S1, S2 normal. No murmurs, rubs, or gallops.  ABDOMEN: Soft, non-tender, non-distended. Bowel sounds present. No organomegaly or mass.  EXTREMITIES: Trace edema, no cyanosis, or clubbing.  NEUROLOGIC: Cranial nerves II through XII are intact. Muscle strength 5/5 in all extremities. Sensation intact. Gait not checked.  PSYCHIATRIC: The patient is alert and oriented x 3.  SKIN: No obvious rash, lesion, or ulcer.   DATA REVIEW:   CBC  Recent Labs Lab 12/19/14 0407  WBC 7.8  HGB 9.7*  HCT 31.1*  PLT 101*    Chemistries   Recent Labs Lab 12/15/14 1052  NA 133*  K 4.3  CL 93*  CO2 28  GLUCOSE 321*  BUN 42*  CREATININE 3.56*  CALCIUM 8.1*     Microbiology Results  Results for orders placed or performed during the hospital encounter of 10/01/14  Blood culture (routine x 2)     Status: None   Collection Time: 10/01/14  4:40 AM  Result Value Ref Range Status   Specimen Description BLOOD RIGHT THUMB  Final   Special Requests BOTTLES DRAWN AEROBIC AND ANAEROBIC 2ML  Final   Culture NO GROWTH 5 DAYS  Final   Report Status 10/06/2014 FINAL  Final  Blood culture (routine x 2)     Status: None   Collection Time: 10/01/14  4:40 AM  Result Value Ref Range Status   Specimen Description BLOOD RIGHT HAND  Final   Special Requests BOTTLES DRAWN AEROBIC AND ANAEROBIC 2ML  Final   Culture NO GROWTH 5 DAYS  Final   Report Status 10/06/2014 FINAL  Final  MRSA PCR Screening     Status: None   Collection Time:  10/01/14  8:47 AM  Result Value Ref Range Status   MRSA by PCR NEGATIVE NEGATIVE Final    Comment:        The GeneXpert MRSA  Assay (FDA approved for NASAL specimens only), is one component of a comprehensive MRSA colonization surveillance program. It is not intended to diagnose MRSA infection nor to guide or monitor treatment for MRSA infections.     Management plans discussed with the patient, family and they are in agreement.  CODE STATUS:     Code Status Orders        Start     Ordered   12/07/14 2212  Full code   Continuous     12/07/14 2211      TOTAL TIME TAKING CARE OF THIS PATIENT: 35 minutes.    Alford Highland M.D on 12/19/2014 at 8:36 AM  Between 7am to 6pm - Pager - [phone]  After 6pm go to [email or web address] - password [email or web address] [facility]  [place] [facility]  Office  [phone]  CC: Primary care physician; Bobbye Morton, MD

## 2014-12-19 NOTE — Care Management (Signed)
spoke with patient's daughter and informed family to transport patient home.  Morrie SheldonAshley with [facility] notified via voicemail

## 2014-12-21 NOTE — Telephone Encounter (Signed)
Called and spoke to patient, [facility].  [facility] [phone] and left message for Marily Memosdna to call back to schedule appointment

## 2014-12-24 NOTE — Telephone Encounter (Signed)
LM for Marily Memosdna to call back to schedule patient;s appt.

## 2014-12-25 NOTE — Telephone Encounter (Signed)
Called and spoke with Marily MemosEdna at [facility].. She said patient could only be seen on Tuesday or Thursday.  There were no appointments available for Ramachandran or Mungal, I added patient to Kasa's schedule for hospital follow up.   Will this be okay?   Misty, please advise.

## 2014-12-25 NOTE — Telephone Encounter (Signed)
That is fine. Thanks for helping me follow up on this.

## 2014-12-27 ENCOUNTER — Ambulatory Visit: Payer: Medicare (Managed Care) | Admitting: Family

## 2015-01-01 ENCOUNTER — Other Ambulatory Visit: Payer: Self-pay | Admitting: Vascular Surgery

## 2015-01-10 ENCOUNTER — Encounter: Payer: Self-pay | Admitting: Internal Medicine

## 2015-01-10 ENCOUNTER — Ambulatory Visit (INDEPENDENT_AMBULATORY_CARE_PROVIDER_SITE_OTHER): Payer: Medicare (Managed Care) | Admitting: Internal Medicine

## 2015-01-10 VITALS — BP 130/68 | HR 99 | Ht 60.0 in | Wt 196.4 lb

## 2015-01-10 DIAGNOSIS — J449 Chronic obstructive pulmonary disease, unspecified: Secondary | ICD-10-CM

## 2015-01-10 MED ORDER — FLUTICASONE FUROATE-VILANTEROL 100-25 MCG/INH IN AEPB: 1.0000 | INHALATION_SPRAY | Freq: Every day | RESPIRATORY_TRACT | Status: AC

## 2015-01-10 MED ORDER — IPRATROPIUM-ALBUTEROL 18-103 MCG/ACT IN AERO: 2.0000 | INHALATION_SPRAY | Freq: Four times a day (QID) | RESPIRATORY_TRACT | Status: DC | PRN

## 2015-01-10 NOTE — Patient Instructions (Signed)
Chronic Obstructive Pulmonary Disease Chronic obstructive pulmonary disease (COPD) is a common lung condition in which airflow from the lungs is limited. COPD is a general term that can be used to describe many different lung problems that limit airflow, including both chronic bronchitis and emphysema. If you have COPD, your lung function will probably never return to normal, but there are measures you can take to improve lung function and make yourself feel better. CAUSES   Smoking (common).  Exposure to secondhand smoke.  Genetic problems.  Chronic inflammatory lung diseases or recurrent infections. SYMPTOMS  Shortness of breath, especially with physical activity.  Deep, persistent (chronic) cough with a large amount of thick mucus.  Wheezing.  Rapid breaths (tachypnea).  Gray or bluish discoloration (cyanosis) of the skin, especially in your fingers, toes, or lips.  Fatigue.  Weight loss.  Frequent infections or episodes when breathing symptoms become much worse (exacerbations).  Chest tightness. DIAGNOSIS Your health care provider will take a medical history and perform a physical examination to diagnose COPD. Additional tests for COPD may include:  Lung (pulmonary) function tests.  Chest X-ray.  CT scan.  Blood tests. TREATMENT  Treatment for COPD may include:  Inhaler and nebulizer medicines. These help manage the symptoms of COPD and make your breathing more comfortable.  Supplemental oxygen. Supplemental oxygen is only helpful if you have a low oxygen level in your blood.  Exercise and physical activity. These are beneficial for nearly all people with COPD.  Lung surgery or transplant.  Nutrition therapy to gain weight, if you are underweight.  Pulmonary rehabilitation. This may involve working with a team of health care providers and specialists, such as respiratory, occupational, and physical therapists. HOME CARE INSTRUCTIONS  Take all medicines  (inhaled or pills) as directed by your health care provider.  Avoid over-the-counter medicines or cough syrups that dry up your airway (such as antihistamines) and slow down the elimination of secretions unless instructed otherwise by your health care provider.  If you are a smoker, the most important thing that you can do is stop smoking. Continuing to smoke will cause further lung damage and breathing trouble. Ask your health care provider for help with quitting smoking. He or she can direct you to community resources or hospitals that provide support.  Avoid exposure to irritants such as smoke, chemicals, and fumes that aggravate your breathing.  Use oxygen therapy and pulmonary rehabilitation if directed by your health care provider. If you require home oxygen therapy, ask your health care provider whether you should purchase a pulse oximeter to measure your oxygen level at home.  Avoid contact with individuals who have a contagious illness.  Avoid extreme temperature and humidity changes.  Eat healthy foods. Eating smaller, more frequent meals and resting before meals may help you maintain your strength.  Stay active, but balance activity with periods of rest. Exercise and physical activity will help you maintain your ability to do things you want to do.  Preventing infection and hospitalization is very important when you have COPD. Make sure to receive all the vaccines your health care provider recommends, especially the pneumococcal and influenza vaccines. Ask your health care provider whether you need a pneumonia vaccine.  Learn and use relaxation techniques to manage stress.  Learn and use controlled breathing techniques as directed by your health care provider. Controlled breathing techniques include:  Pursed lip breathing. Start by breathing in (inhaling) through your nose for 1 second. Then, purse your lips as if you were   going to whistle and breathe out (exhale) through the  pursed lips for 2 seconds.  Diaphragmatic breathing. Start by putting one hand on your abdomen just above your waist. Inhale slowly through your nose. The hand on your abdomen should move out. Then purse your lips and exhale slowly. You should be able to feel the hand on your abdomen moving in as you exhale.  Learn and use controlled coughing to clear mucus from your lungs. Controlled coughing is a series of short, progressive coughs. The steps of controlled coughing are: 1. Lean your head slightly forward. 2. Breathe in deeply using diaphragmatic breathing. 3. Try to hold your breath for 3 seconds. 4. Keep your mouth slightly open while coughing twice. 5. Spit any mucus out into a tissue. 6. Rest and repeat the steps once or twice as needed. SEEK MEDICAL CARE IF:  You are coughing up more mucus than usual.  There is a change in the color or thickness of your mucus.  Your breathing is more labored than usual.  Your breathing is faster than usual. SEEK IMMEDIATE MEDICAL CARE IF:  You have shortness of breath while you are resting.  You have shortness of breath that prevents you from:  Being able to talk.  Performing your usual physical activities.  You have chest pain lasting longer than 5 minutes.  Your skin color is more cyanotic than usual.  You measure low oxygen saturations for longer than 5 minutes with a pulse oximeter. MAKE SURE YOU:  Understand these instructions.  Will watch your condition.  Will get help right away if you are not doing well or get worse.   This information is not intended to replace advice given to you by your health care provider. Make sure you discuss any questions you have with your health care provider.   Document Released: 10/15/2004 Document Revised: 01/26/2014 Document Reviewed: 09/01/2012 Else[employer] Interactive Patient Education 2016 [employer]  
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
## 2015-01-10 NOTE — Addendum Note (Signed)
Addended by: Maxwell MarionBLANKENSHIP, MARGIE A on: 01/10/2015 11:01 AM   Modules accepted: Orders

## 2015-01-15 ENCOUNTER — Encounter: Payer: Self-pay | Admitting: Vascular Surgery

## 2015-01-15 ENCOUNTER — Ambulatory Visit
Admission: RE | Admit: 2015-01-15 | Discharge: 2015-01-15 | Disposition: A | Discharge status: Home / Self Care | Payer: Medicare (Managed Care) | Source: Ambulatory Visit | Attending: Vascular Surgery | Admitting: Vascular Surgery

## 2015-01-15 ENCOUNTER — Encounter
Admission: RE | Disposition: A | Discharge status: Home / Self Care | Payer: Self-pay | Source: Ambulatory Visit | Attending: Vascular Surgery

## 2015-01-15 DIAGNOSIS — N186 End stage renal disease: Secondary | ICD-10-CM | POA: Diagnosis not present

## 2015-01-15 DIAGNOSIS — Z7982 Long term (current) use of aspirin: Secondary | ICD-10-CM | POA: Insufficient documentation

## 2015-01-15 DIAGNOSIS — E785 Hyperlipidemia, unspecified: Secondary | ICD-10-CM | POA: Insufficient documentation

## 2015-01-15 DIAGNOSIS — Z6838 Body mass index (BMI) 38.0-38.9, adult: Secondary | ICD-10-CM | POA: Insufficient documentation

## 2015-01-15 DIAGNOSIS — E1122 Type 2 diabetes mellitus with diabetic chronic kidney disease: Secondary | ICD-10-CM | POA: Insufficient documentation

## 2015-01-15 DIAGNOSIS — Z794 Long term (current) use of insulin: Secondary | ICD-10-CM | POA: Diagnosis not present

## 2015-01-15 DIAGNOSIS — Z79899 Other long term (current) drug therapy: Secondary | ICD-10-CM | POA: Diagnosis not present

## 2015-01-15 DIAGNOSIS — I6529 Occlusion and stenosis of unspecified carotid artery: Secondary | ICD-10-CM | POA: Diagnosis not present

## 2015-01-15 DIAGNOSIS — T82858A Stenosis of vascular prosthetic devices, implants and grafts, initial encounter: Secondary | ICD-10-CM | POA: Insufficient documentation

## 2015-01-15 DIAGNOSIS — Z992 Dependence on renal dialysis: Secondary | ICD-10-CM | POA: Diagnosis not present

## 2015-01-15 DIAGNOSIS — Y832 Surgical operation with anastomosis, bypass or graft as the cause of abnormal reaction of the patient, or of later complication, without mention of misadventure at the time of the procedure: Secondary | ICD-10-CM | POA: Insufficient documentation

## 2015-01-15 DIAGNOSIS — I519 Heart disease, unspecified: Secondary | ICD-10-CM | POA: Insufficient documentation

## 2015-01-15 HISTORY — PX: PERIPHERAL VASCULAR CATHETERIZATION: SHX172C

## 2015-01-15 LAB — GLUCOSE, CAPILLARY
GLUCOSE-CAPILLARY: 90 mg/dL (ref 65–99)
Glucose-Capillary: 41 mg/dL — CL (ref 65–99)
Glucose-Capillary: 42 mg/dL — CL (ref 65–99)

## 2015-01-15 LAB — POTASSIUM: Potassium: 4 mmol/L (ref 3.5–5.1)

## 2015-01-15 SURGERY — A/V SHUNTOGRAM/FISTULAGRAM
Anesthesia: Moderate Sedation

## 2015-01-15 MED ORDER — MIDAZOLAM HCL 2 MG/2ML IJ SOLN
INTRAMUSCULAR | Status: DC | PRN
  Administered 2015-01-15: 1 mg via INTRAVENOUS
  Administered 2015-01-15: 2 mg via INTRAVENOUS

## 2015-01-15 MED ORDER — HYDROMORPHONE HCL 1 MG/ML IJ SOLN: 1.0000 mg | Freq: Once | INTRAMUSCULAR | Status: DC

## 2015-01-15 MED ORDER — ACETAMINOPHEN 325 MG PO TABS: 325.0000 mg | ORAL_TABLET | ORAL | Status: DC | PRN

## 2015-01-15 MED ORDER — HEPARIN SODIUM (PORCINE) 1000 UNIT/ML IJ SOLN
INTRAMUSCULAR | Status: AC
  Filled 2015-01-15: qty 1

## 2015-01-15 MED ORDER — METHYLPREDNISOLONE SODIUM SUCC 125 MG IJ SOLR: 125.0000 mg | INTRAMUSCULAR | Status: DC | PRN

## 2015-01-15 MED ORDER — FAMOTIDINE 20 MG PO TABS: 40.0000 mg | ORAL_TABLET | ORAL | Status: DC | PRN

## 2015-01-15 MED ORDER — SODIUM CHLORIDE 0.9 % IV SOLN
INTRAVENOUS | Status: DC
  Administered 2015-01-15: 12:00:00 via INTRAVENOUS

## 2015-01-15 MED ORDER — OXYCODONE HCL 5 MG PO TABS: 5.0000 mg | ORAL_TABLET | ORAL | Status: DC | PRN

## 2015-01-15 MED ORDER — ACETAMINOPHEN 325 MG RE SUPP
325.0000 mg | RECTAL | Status: DC | PRN
  Filled 2015-01-15: qty 2

## 2015-01-15 MED ORDER — HEPARIN (PORCINE) IN NACL 2-0.9 UNIT/ML-% IJ SOLN
INTRAMUSCULAR | Status: AC
  Filled 2015-01-15: qty 1000

## 2015-01-15 MED ORDER — DEXTROSE 50 % IV SOLN
INTRAVENOUS | Status: AC
  Administered 2015-01-15: 50 mL via INTRAVENOUS
  Filled 2015-01-15: qty 50

## 2015-01-15 MED ORDER — ONDANSETRON HCL 4 MG/2ML IJ SOLN: 4.0000 mg | Freq: Four times a day (QID) | INTRAMUSCULAR | Status: DC | PRN

## 2015-01-15 MED ORDER — DEXTROSE 5 % IV SOLN
1.5000 g | INTRAVENOUS | Status: AC
  Administered 2015-01-15: 1.5 g via INTRAVENOUS

## 2015-01-15 MED ORDER — IOHEXOL 300 MG/ML  SOLN
INTRAMUSCULAR | Status: DC | PRN
  Administered 2015-01-15: 30 mL via INTRAVENOUS

## 2015-01-15 MED ORDER — HYDROMORPHONE HCL 1 MG/ML IJ SOLN: 0.5000 mg | INTRAMUSCULAR | Status: DC | PRN

## 2015-01-15 MED ORDER — HEPARIN SODIUM (PORCINE) 1000 UNIT/ML IJ SOLN
INTRAMUSCULAR | Status: DC | PRN
  Administered 2015-01-15: 3000 [IU] via INTRAVENOUS

## 2015-01-15 MED ORDER — FENTANYL CITRATE (PF) 100 MCG/2ML IJ SOLN
INTRAMUSCULAR | Status: AC
  Filled 2015-01-15: qty 4

## 2015-01-15 MED ORDER — FENTANYL CITRATE (PF) 100 MCG/2ML IJ SOLN
INTRAMUSCULAR | Status: DC | PRN
  Administered 2015-01-15 (×2): 50 ug via INTRAVENOUS

## 2015-01-15 MED ORDER — LIDOCAINE HCL (PF) 1 % IJ SOLN
INTRAMUSCULAR | Status: AC
  Filled 2015-01-15: qty 10

## 2015-01-15 MED ORDER — MIDAZOLAM HCL 5 MG/5ML IJ SOLN
INTRAMUSCULAR | Status: AC
Start: 2015-01-15 — End: 2015-01-15
  Filled 2015-01-15: qty 5

## 2015-01-15 MED ORDER — DEXTROSE 50 % IV SOLN
1.0000 | Freq: Once | INTRAVENOUS | Status: AC
  Administered 2015-01-15: 50 mL via INTRAVENOUS

## 2015-01-15 SURGICAL SUPPLY — 17 items
BALLN DORADO 8X40X80 (BALLOONS) ×4
BALLN DORADO 8X60X80 (BALLOONS) ×4
BALLOON DORADO 8X40X80 (BALLOONS) ×2 IMPLANT
BALLOON DORADO 8X60X80 (BALLOONS) ×2 IMPLANT
DEVICE PRESTO INFLATION (MISCELLANEOUS) ×4 IMPLANT
DRAPE BRACHIAL (DRAPES) ×4 IMPLANT
PACK ANGIOGRAPHY (CUSTOM PROCEDURE TRAY) ×4 IMPLANT
SET INTRO CAPELLA COAXIAL (SET/KITS/TRAYS/PACK) ×4 IMPLANT
SHEATH BRITE TIP 6FRX5.5 (SHEATH) ×4 IMPLANT
SHEATH BRITE TIP 7FRX5.5 (SHEATH) ×4 IMPLANT
SHEATH BRITE TIP 8FRX11 (SHEATH) ×4 IMPLANT
STENT FLUENCY 7X100X80 (Permanent Stent) IMPLANT
STENT VIABAHN 8X100X120 (Permanent Stent) ×2 IMPLANT
STENT VIABAHN 8X10X120 (Permanent Stent) ×2 IMPLANT
TOWEL OR 17X26 4PK STRL BLUE (TOWEL DISPOSABLE) ×4 IMPLANT
WIRE G V18X300CM (WIRE) ×8 IMPLANT
WIRE MAGIC TORQUE 260C (WIRE) ×8 IMPLANT

## 2015-01-15 NOTE — Discharge Instructions (Signed)
Fistulogram, Care After °Refer to this sheet in the next few weeks. These instructions provide you with information on caring for yourself after your procedure. Your health care provider may also give you more specific instructions. Your treatment has been planned according to current medical practices, but problems sometimes occur. Call your health care provider if you have any problems or questions after your procedure. °WHAT TO EXPECT AFTER THE PROCEDURE °After your procedure, it is typical to have the following: °· A small amount of discomfort in the area where the catheters were placed. °· A small amount of bruising around the fistula. °· Sleepiness and fatigue. °HOME CARE INSTRUCTIONS °· Rest at home for the day following your procedure. °· Do not drive or operate heavy machinery while taking pain medicine. °· Take medicines only as directed by your health care provider. °· Do not take baths, swim, or use a hot tub until your health care provider approves. You may shower 24 hours after the procedure or as directed by your health care provider. °· There are many different ways to close and cover an incision, including stitches, skin glue, and adhesive strips. Follow your health care provider's instructions on: °¨ Incision care. °¨ Bandage (dressing) changes and removal. °¨ Incision closure removal. °· Monitor your dialysis fistula carefully. °SEEK MEDICAL CARE IF: °· You have drainage, redness, swelling, or pain at your catheter site. °· You have a fever. °· You have chills. °SEEK IMMEDIATE MEDICAL CARE IF: °· You feel weak. °· You have trouble balancing. °· You have trouble moving your arms or legs. °· You have problems with your speech or vision. °· You can no longer feel a vibration or buzz when you put your fingers over your dialysis fistula. °· The limb that was used for the procedure: °¨ Swells. °¨ Is painful. °¨ Is cold. °¨ Is discolored, such as blue or pale white. °  °This information is not intended  to replace advice given to you by your health care provider. Make sure you discuss any questions you have with your health care provider. °  °Document Released: 05/22/2013 Document Reviewed: 05/22/2013 °Elsevier Interactive Patient Education ©2016 [employer] ° °

## 2015-01-15 NOTE — Progress Notes (Signed)
Visibly, pt not symptomatic, but noting fsbs of 42, with Dr Gilda CreaseSchnier made aware, with iv placement, giving amp d50 per orders, pt awake , alert and oriented prior to med given

## 2015-01-15 NOTE — Progress Notes (Signed)
Patient is alert and oriented. Reporting no pain. Right upper arm site WNL, gauze clean dry intact. Follow up appt made, discharge and follow up reviewed with patient and daughter. Escorted out via wheelchair with auxillary.

## 2015-01-15 NOTE — H&P (Signed)
[facility] History & Physical Update  The patient was interviewed and re-examined.  The patient's previous History and Physical has been reviewed and is unchanged.  There is no change in the plan of care. We plan to proceed with the scheduled procedure.  Wilhelmina Hark, Latina CraverGregory G, MD  01/15/2015, 12:36 PM

## 2015-01-15 NOTE — Op Note (Signed)
OPERATIVE NOTE   PROCEDURE: 1. Contrast injection left arm radial axillary dialysis graft 2. Percutaneous transluminal angioplasty and stent placement venous portion left arm AV graft  PRE-OPERATIVE DIAGNOSIS: Complication of dialysis access                                                       End Stage Renal Disease  POST-OPERATIVE DIAGNOSIS: same as above   SURGEON: Renford DillsGregory G. Akeela Busk, M.D.  ANESTHESIA: Conscious Sedation   ESTIMATED BLOOD LOSS: minimal  FINDING(S): 1. 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 female who  presents with malfunctioning left arm brachial axillary AV access.  The patient is scheduled for angiography with possible intervention of the AV access.  The patient is aware the risks include but are not limited to: bleeding, infection, thrombosis of the cannulated access, and possible anaphylactic reaction to the contrast.  The patient acknowledges if the access can not be salvaged a tunneled catheter will be needed and will be placed during this procedure.  The patient is aware of the risks of the procedure and elects to proceed with the angiogram and intervention.  DESCRIPTION: After full informed written consent was obtained, the patient was brought back to the Special Procedure suite and placed supine position.  Appropriate cardiopulmonary monitors were placed.  The left arm was prepped and draped in the standard fashion.  Appropriate timeout is called. The left arm brachial axillary graft  was cannulated under ultrasound visualization with a micropuncture needle.  The graft was noted to be echolucent indicating patency and image was recorded for the permanent record. The microwire was advanced and the needle was exchanged for  a microsheath.  The J-wire was then advanced and a 6 Fr sheath inserted.  Hand injections were  completed to image the access from the arterial anastomosis through the entire access.  The central venous structures were also imaged by hand injections.  Based on the images,  3000 units of heparin was given and a wire was negotiated through the strictures within the venous portion of the graft.  An 8 mm x 10 cm Viabahn was deployed across the stenoses and postdilated with an 8 mm Dorado balloon.  Follow-up imaging demonstrates complete resolution of the stricture with rapid flow of contrast through the graft central venous anatomy is preserved.  A 4-0 Monocryl purse-string suture was sewn around the sheath.  The sheath was removed and light pressure was applied.  A sterile bandage was applied to the puncture site.    COMPLICATIONS: None  CONDITION: Savannah RegisterGood  [name] G. [name], M.D [facility] Office: [phone]  01/15/2015 1:24 PM

## 2015-03-02 IMAGING — CT CT OF THE RIGHT ANKLE WITHOUT CONTRAST
5 of 6 series · 15 of 33 positions shown, 16 images · non-contrast
Comparison: Right tibia/fibula radiographs performed earlier today
at [DATE] p.m.

CLINICAL DATA: Request further evaluation of right ankle fracture.
Status post fall, with deformity of the right lower leg and
associated pain. Initial encounter.

EXAM:
CT OF THE RIGHT ANKLE WITHOUT CONTRAST
TECHNIQUE: Multidetector CT imaging of the right ankle was performed according
to the standard protocol. Multiplanar CT image reconstructions were
also generated.

[Series 2: ankle · axial · 0.29mm/px · z∈[+42,+128]mm · 4 of 96 slices shown, 5 images]
[im 20/96  soft-tissue]
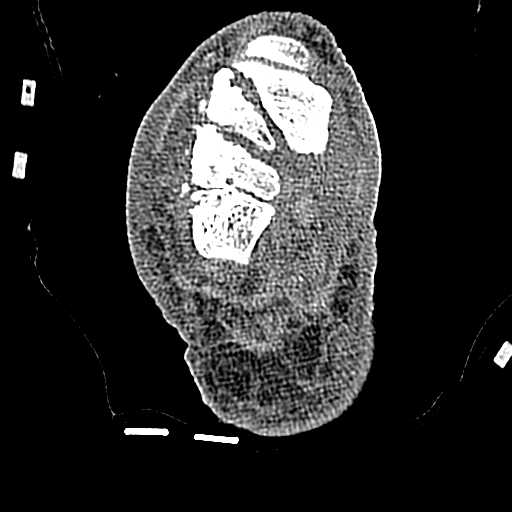
[im 20/96  bone]
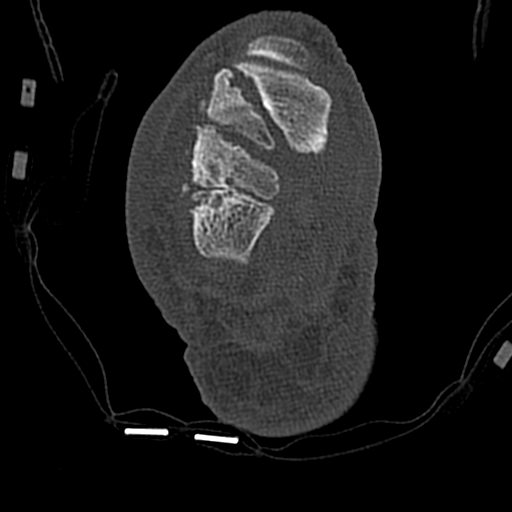
[im 39/96  bone]
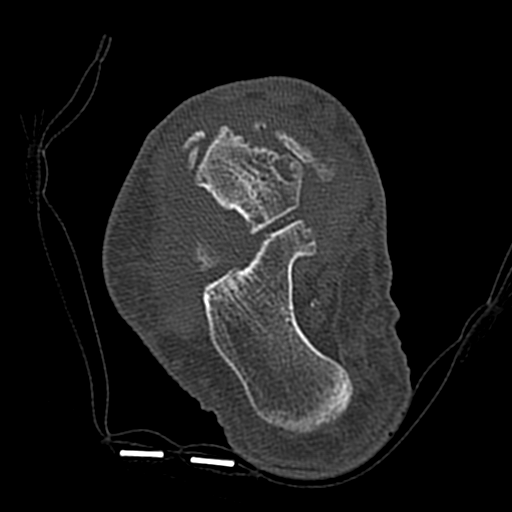
[im 58/96  bone]
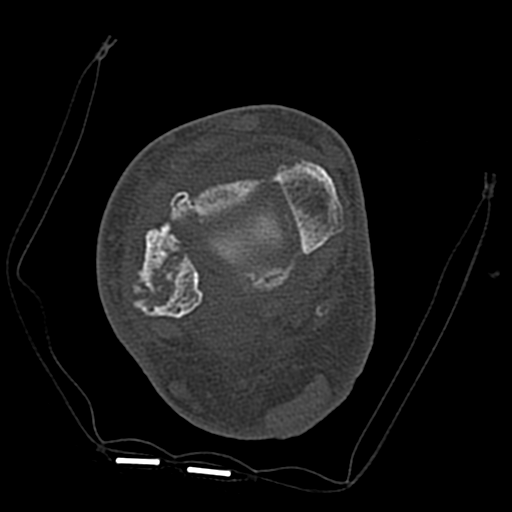
[im 77/96  bone]
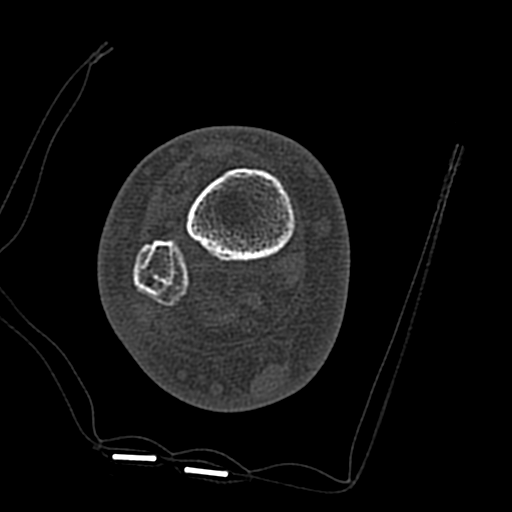

[Series 5: axial soft tissue 2 · axial · 0.29mm/px · z∈[+50,+98]mm · 2 of 72 slices shown]
[im 24/72  soft-tissue]
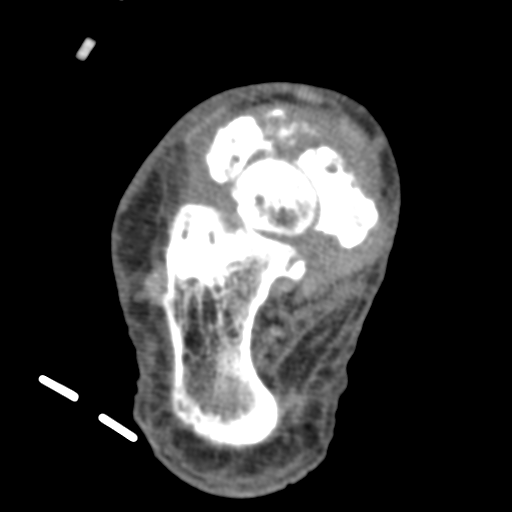
[im 48/72  soft-tissue]
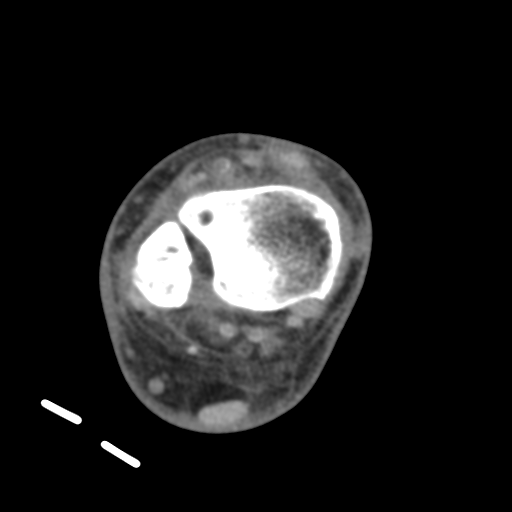

[Series 6: cor soft tissue · coronal · 0.23mm/px · 3 of 61 slices shown]
[im 13/61  bone]
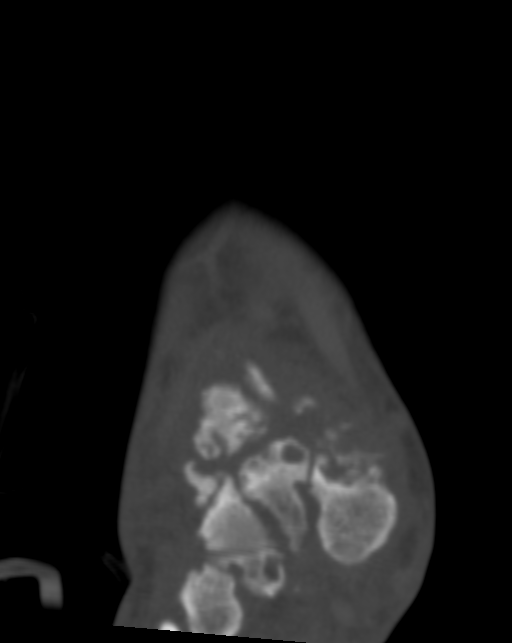
[im 25/61  bone]
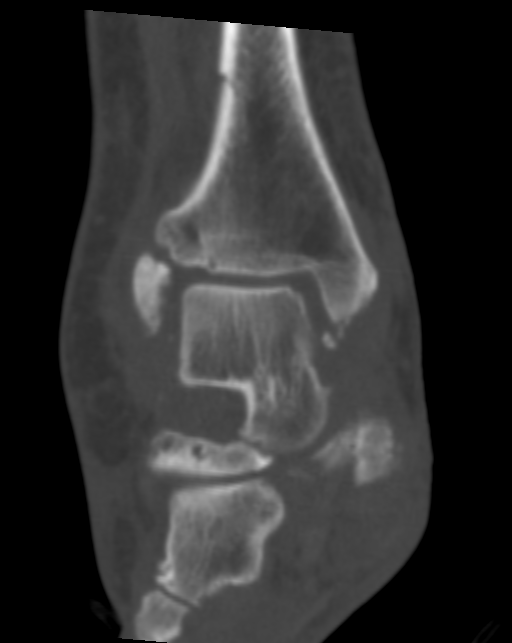
[im 37/61  bone]
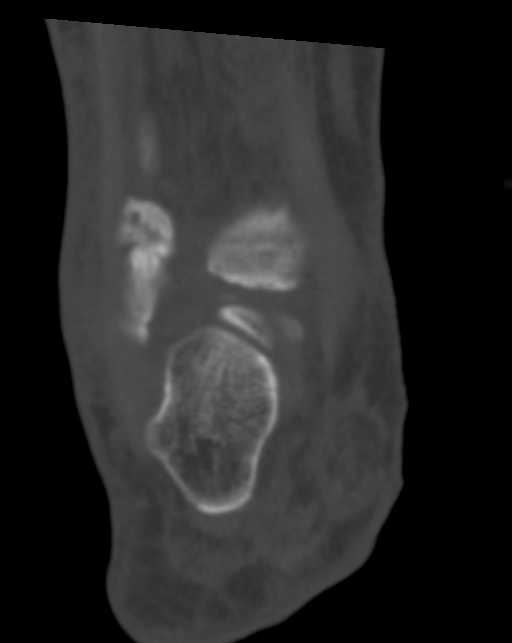

[Series 8: axial bone 2 · axial · 0.29mm/px · z∈[+52,+98]mm · 2 of 70 slices shown]
[im 24/70  bone]
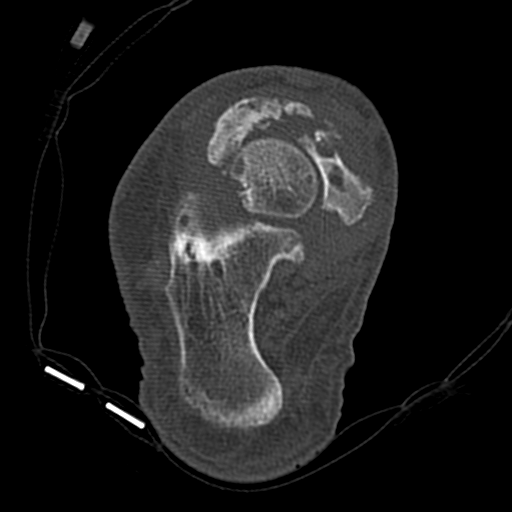
[im 47/70  bone]
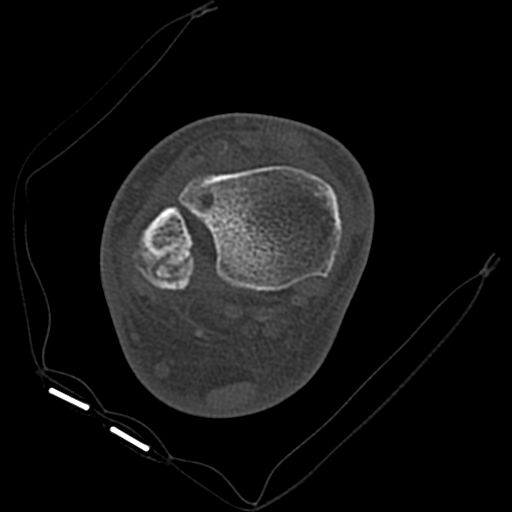

[Series 10: sag bone · sagittal · 0.29mm/px · 4 of 42 slices shown]
[im 9/42  bone]
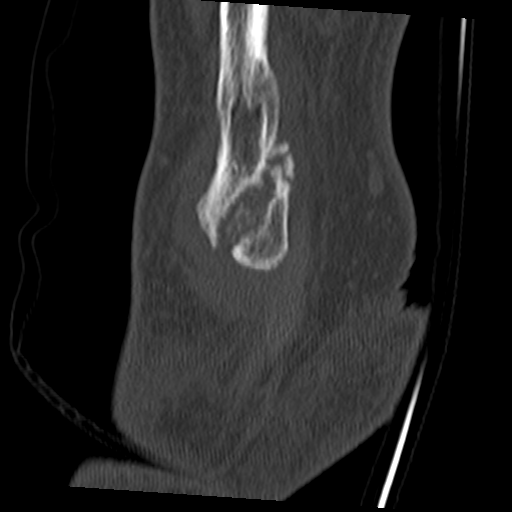
[im 17/42  bone]
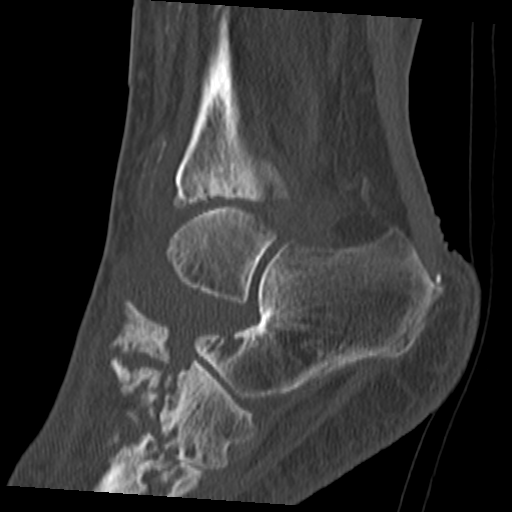
[im 25/42  bone]
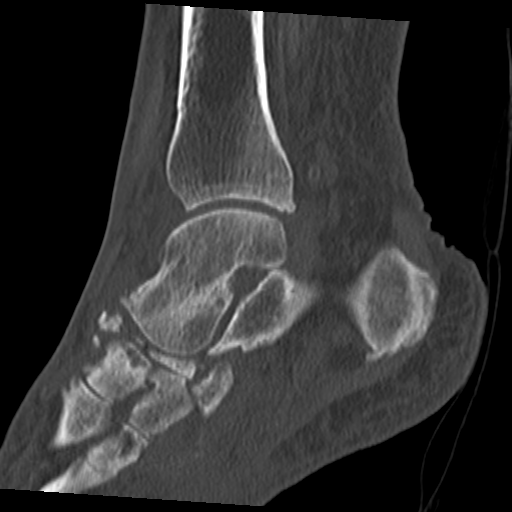
[im 33/42  bone]
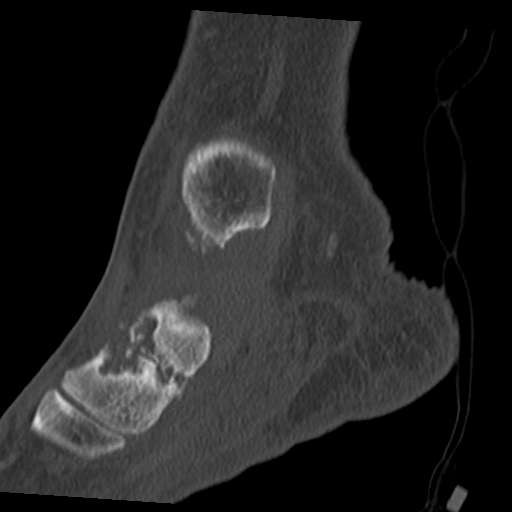

[15 of 33 positions shown; findings below may reference images not displayed]

FINDINGS: The spiral fracture through the distal tibial diaphysis ends just
above the level of the tibial plafond, and is essentially
nondisplaced. There are a few small osseous fragments arising at the
distal tip of the medial malleolus, raising suspicion for acute
avulsion fracture. A nonhealed fracture of the distal fibula is
noted, new from 6677 but chronic in appearance, with mild callus
formation and bony remodeling.

The widening of the medial ankle mortise noted on radiographs
appears to be chronic in nature, and may have occurred at the time
of the distal fibular fracture, since no significant acute fracture
is noted with regard to the malleoli.

Prominent subcortical cystic change is noted at the midfoot, with
diffuse destruction of midfoot osseous structures, compatible with
Charcot joint. Mild subcortical cystic change is noted at the
anterior talus and calcaneus.

Diffuse vascular calcifications are seen. Mild soft tissue edema is
noted at the lateral aspect of the lower leg. Diffuse soft tissue
edema at the site of Charcot joint is likely chronic in nature.
IMPRESSION: 1. Spiral fracture through the distal tibia diaphysis ends just
above the level of the tibial plafond, and is essentially
nondisplaced. No evidence of intra-articular extension.
2. Few small osseous fragments arising at the distal tip of the
medial malleolus raise suspicion for acute avulsion fracture.
3. Nonhealed fracture of the distal fibula is new from 6677 but
chronic in appearance, with mild callus formation and bony
remodeling. Widening of the medial ankle mortise appears to be
chronic in nature, and may have occurred at the time of the distal
fibular fracture.
4. Charcot joint noted at the midfoot, with diffuse destruction of
midfoot osseous structures, and surrounding subcortical cystic
change.
5. Diffuse vascular calcifications noted.

## 2015-03-04 ENCOUNTER — Encounter: Payer: Self-pay | Admitting: Emergency Medicine

## 2015-03-04 ENCOUNTER — Observation Stay
Admission: EM | Admit: 2015-03-04 | Discharge: 2015-03-06 | Disposition: A | Discharge status: Home / Self Care | Payer: Medicare (Managed Care) | Attending: Specialist | Admitting: Specialist

## 2015-03-04 DIAGNOSIS — D631 Anemia in chronic kidney disease: Secondary | ICD-10-CM | POA: Diagnosis not present

## 2015-03-04 DIAGNOSIS — R5381 Other malaise: Secondary | ICD-10-CM | POA: Diagnosis not present

## 2015-03-04 DIAGNOSIS — K7290 Hepatic failure, unspecified without coma: Secondary | ICD-10-CM | POA: Insufficient documentation

## 2015-03-04 DIAGNOSIS — I472 Ventricular tachycardia: Secondary | ICD-10-CM | POA: Diagnosis not present

## 2015-03-04 DIAGNOSIS — G8929 Other chronic pain: Secondary | ICD-10-CM | POA: Insufficient documentation

## 2015-03-04 DIAGNOSIS — I2510 Atherosclerotic heart disease of native coronary artery without angina pectoris: Secondary | ICD-10-CM | POA: Insufficient documentation

## 2015-03-04 DIAGNOSIS — R300 Dysuria: Secondary | ICD-10-CM | POA: Diagnosis not present

## 2015-03-04 DIAGNOSIS — I4891 Unspecified atrial fibrillation: Secondary | ICD-10-CM | POA: Diagnosis not present

## 2015-03-04 DIAGNOSIS — K219 Gastro-esophageal reflux disease without esophagitis: Secondary | ICD-10-CM | POA: Insufficient documentation

## 2015-03-04 DIAGNOSIS — R531 Weakness: Secondary | ICD-10-CM | POA: Insufficient documentation

## 2015-03-04 DIAGNOSIS — M1990 Unspecified osteoarthritis, unspecified site: Secondary | ICD-10-CM | POA: Insufficient documentation

## 2015-03-04 DIAGNOSIS — I252 Old myocardial infarction: Secondary | ICD-10-CM | POA: Diagnosis not present

## 2015-03-04 DIAGNOSIS — E039 Hypothyroidism, unspecified: Secondary | ICD-10-CM | POA: Diagnosis not present

## 2015-03-04 DIAGNOSIS — Z8249 Family history of ischemic heart disease and other diseases of the circulatory system: Secondary | ICD-10-CM | POA: Insufficient documentation

## 2015-03-04 DIAGNOSIS — I5023 Acute on chronic systolic (congestive) heart failure: Secondary | ICD-10-CM | POA: Insufficient documentation

## 2015-03-04 DIAGNOSIS — Z882 Allergy status to sulfonamides status: Secondary | ICD-10-CM | POA: Insufficient documentation

## 2015-03-04 DIAGNOSIS — I6529 Occlusion and stenosis of unspecified carotid artery: Secondary | ICD-10-CM | POA: Insufficient documentation

## 2015-03-04 DIAGNOSIS — Z992 Dependence on renal dialysis: Secondary | ICD-10-CM | POA: Diagnosis not present

## 2015-03-04 DIAGNOSIS — F329 Major depressive disorder, single episode, unspecified: Secondary | ICD-10-CM | POA: Diagnosis not present

## 2015-03-04 DIAGNOSIS — E1140 Type 2 diabetes mellitus with diabetic neuropathy, unspecified: Secondary | ICD-10-CM | POA: Diagnosis not present

## 2015-03-04 DIAGNOSIS — Z7951 Long term (current) use of inhaled steroids: Secondary | ICD-10-CM | POA: Insufficient documentation

## 2015-03-04 DIAGNOSIS — I132 Hypertensive heart and chronic kidney disease with heart failure and with stage 5 chronic kidney disease, or end stage renal disease: Secondary | ICD-10-CM | POA: Insufficient documentation

## 2015-03-04 DIAGNOSIS — Z7982 Long term (current) use of aspirin: Secondary | ICD-10-CM | POA: Insufficient documentation

## 2015-03-04 DIAGNOSIS — Z79899 Other long term (current) drug therapy: Secondary | ICD-10-CM | POA: Insufficient documentation

## 2015-03-04 DIAGNOSIS — Z794 Long term (current) use of insulin: Secondary | ICD-10-CM | POA: Insufficient documentation

## 2015-03-04 DIAGNOSIS — E1122 Type 2 diabetes mellitus with diabetic chronic kidney disease: Principal | ICD-10-CM | POA: Insufficient documentation

## 2015-03-04 DIAGNOSIS — Z9049 Acquired absence of other specified parts of digestive tract: Secondary | ICD-10-CM | POA: Insufficient documentation

## 2015-03-04 DIAGNOSIS — G2581 Restless legs syndrome: Secondary | ICD-10-CM | POA: Diagnosis not present

## 2015-03-04 DIAGNOSIS — N186 End stage renal disease: Secondary | ICD-10-CM | POA: Diagnosis not present

## 2015-03-04 LAB — COMPREHENSIVE METABOLIC PANEL
ALK PHOS: 113 U/L (ref 38–126)
ALT: 28 U/L (ref 14–54)
ANION GAP: 13 (ref 5–15)
AST: 51 U/L — ABNORMAL HIGH (ref 15–41)
Albumin: 3.5 g/dL (ref 3.5–5.0)
BILIRUBIN TOTAL: 0.7 mg/dL (ref 0.3–1.2)
BUN: 23 mg/dL — ABNORMAL HIGH (ref 6–20)
CALCIUM: 8.4 mg/dL — AB (ref 8.9–10.3)
CO2: 26 mmol/L (ref 22–32)
CREATININE: 3.23 mg/dL — AB (ref 0.44–1.00)
Chloride: 97 mmol/L — ABNORMAL LOW (ref 101–111)
GFR calc non Af Amer: 13 mL/min — ABNORMAL LOW (ref >60)
GFR, EST AFRICAN AMERICAN: 15 mL/min — AB (ref >60)
GLUCOSE: 171 mg/dL — AB (ref 65–99)
Potassium: 4 mmol/L (ref 3.5–5.1)
Sodium: 136 mmol/L (ref 135–145)
TOTAL PROTEIN: 6.4 g/dL — AB (ref 6.5–8.1)

## 2015-03-04 LAB — CBC
HEMATOCRIT: 26.9 % — AB (ref 35.0–47.0)
HEMOGLOBIN: 8 g/dL — AB (ref 12.0–16.0)
MCH: 24.3 pg — AB (ref 26.0–34.0)
MCHC: 29.8 g/dL — AB (ref 32.0–36.0)
MCV: 81.5 fL (ref 80.0–100.0)
Platelets: 134 10*3/uL — ABNORMAL LOW (ref 150–440)
RBC: 3.3 MIL/uL — ABNORMAL LOW (ref 3.80–5.20)
RDW: 22.5 % — ABNORMAL HIGH (ref 11.5–14.5)
WBC: 11.1 10*3/uL — ABNORMAL HIGH (ref 3.6–11.0)

## 2015-03-04 LAB — URINALYSIS COMPLETE WITH MICROSCOPIC (ARMC ONLY)
GLUCOSE, UA: NEGATIVE mg/dL
Hgb urine dipstick: NEGATIVE
Nitrite: NEGATIVE
Protein, ur: 30 mg/dL — AB
SPECIFIC GRAVITY, URINE: 1.02 (ref 1.005–1.030)
pH: 5 (ref 5.0–8.0)

## 2015-03-04 LAB — TROPONIN I
TROPONIN I: 0.15 ng/mL — AB (ref <0.031)
Troponin I: 0.13 ng/mL — ABNORMAL HIGH (ref <0.031)

## 2015-03-04 LAB — MAGNESIUM: MAGNESIUM: 2.3 mg/dL (ref 1.7–2.4)

## 2015-03-04 MED ORDER — MORPHINE SULFATE (PF) 4 MG/ML IV SOLN
4.0000 mg | Freq: Once | INTRAVENOUS | Status: AC
  Administered 2015-03-04: 4 mg via INTRAVENOUS
  Filled 2015-03-04: qty 1

## 2015-03-04 MED ORDER — SODIUM CHLORIDE 0.9 % IV BOLUS (SEPSIS)
500.0000 mL | Freq: Once | INTRAVENOUS | Status: AC
  Administered 2015-03-04: 500 mL via INTRAVENOUS

## 2015-03-04 MED ORDER — MORPHINE SULFATE (PF) 4 MG/ML IV SOLN
INTRAVENOUS | Status: AC
  Filled 2015-03-04: qty 1

## 2015-03-04 NOTE — ED Notes (Signed)
Per EMS, patient comes from home with c/o generalized weakness. Patient was at dialysis today, she is unaware of how much fluid was taken off. Patient states she is not able to stand. Patient states she may have a possible UTI since there is blood in her urine. Hx of a fib, MI. Patient is A&O x4.

## 2015-03-04 NOTE — ED Notes (Signed)
Patient states, "My legs wont work". Patient is normally ambulatory at home. Patient is moaning and stating, "my legs are hurting". Shortly after this statement, patient is resting with her eyes closed and laying still. Holding morphine at this time. MD aware.

## 2015-03-04 NOTE — ED Provider Notes (Addendum)
[facility] Emergency Department Provider Note  Time seen: 6:27 PM  I have reviewed the triage vital signs and the nursing notes.   HISTORY  Chief Complaint Weakness    HPI Savannah Key is a 75 y.o. female with a past medical history of end-stage renal disease on hemodialysis Monday, Wednesday, Friday, hypothyroidism, diabetes, depression, liver failure, MI in the past, hypertension, CHF, who presents to the emergency department with generalized weakness. According to the patient she received her full dialysis session today. She states after dialysis she always feels weak but today the weakness was worse than normal. Patient is also complaining of some mild lower abdominal discomfort with dysuria over the past several days. It is also noticed a small amount of blood in her urine. Denies any chest pain, cough or congestion. Denies fever. Denies nausea or vomiting. Her main complaint today is generalized weakness which began after dialysis today.     Past Medical History  Diagnosis Date  . Chronic kidney disease   . GERD (gastroesophageal reflux disease)   . Carotid artery stenosis   . Coronary artery disease   . Hypothyroidism   . Diabetes mellitus without complication (HCC)   . Depression   . Arthritis   . Kidney failure   . Liver failure (HCC)   . MI (myocardial infarction) (HCC)   . HTN (hypertension)   . Broken leg     R  . CHF (congestive heart failure) [facility])     Patient Active Problem List   Diagnosis Date Noted  . Acute on chronic systolic (congestive) heart failure (HCC) 12/07/2014    Past Surgical History  Procedure Laterality Date  . Av fistula placement Left   . Cholecystectomy    . Peripheral vascular catheterization N/A 06/11/2014    Procedure: A/V Shuntogram/Fistulagram;  Surgeon: Annice Needy, MD;  Location: [facility] INVASIVE CV LAB;  Service: Cardiovascular;  Laterality: N/A;  . Peripheral vascular catheterization Left 06/11/2014   Procedure: A/V Shunt Intervention;  Surgeon: Annice Needy, MD;  Location: [facility] INVASIVE CV LAB;  Service: Cardiovascular;  Laterality: Left;  . Open reduction internal fixation (orif) distal radial fracture Left 08/09/2014    Procedure: OPEN REDUCTION INTERNAL FIXATION (ORIF) DISTAL RADIAL FRACTURE;  Surgeon: Kennedy Bucker, MD;  Location: [facility] ORS;  Service: Orthopedics;  Laterality: Left;  . Peripheral vascular catheterization Left 11/06/2014    Procedure: A/V Shuntogram/Fistulagram;  Surgeon: Renford Dills, MD;  Location: [facility] INVASIVE CV LAB;  Service: Cardiovascular;  Laterality: Left;  . Peripheral vascular catheterization Left 11/06/2014    Procedure: A/V Shunt Intervention;  Surgeon: Renford Dills, MD;  Location: [facility] INVASIVE CV LAB;  Service: Cardiovascular;  Laterality: Left;  . Peripheral vascular catheterization Left 01/15/2015    Procedure: A/V Shuntogram/Fistulagram;  Surgeon: Renford Dills, MD;  Location: [facility] INVASIVE CV LAB;  Service: Cardiovascular;  Laterality: Left;  . Peripheral vascular catheterization N/A 01/15/2015    Procedure: A/V Shunt Intervention;  Surgeon: Renford Dills, MD;  Location: [facility] INVASIVE CV LAB;  Service: Cardiovascular;  Laterality: N/A;    Current Outpatient Rx  Name  Route  Sig  Dispense  Refill  . albuterol-ipratropium (COMBIVENT) 18-103 MCG/ACT inhaler   Inhalation   Inhale 2 puffs into the lungs 4 (four) times daily as needed for wheezing.   1 Inhaler   2   . aspirin EC 81 MG tablet   Oral   Take 81 mg by mouth daily.          Marland Kitchen  atorvastatin (LIPITOR) 80 MG tablet   Oral   Take 80 mg by mouth at bedtime.         . benzonatate (TESSALON) 100 MG capsule   Oral   Take 100 mg by mouth 3 (three) times daily as needed for cough.         . budesonide (PULMICORT) 0.25 MG/2ML nebulizer solution   Nebulization   Take 2 mLs (0.25 mg total) by nebulization 2 (two) times daily.   60 mL   12   . calcium acetate (PHOSLO) 667  MG capsule   Oral   Take 667 mg by mouth 3 (three) times daily with meals.         . cetirizine (ZYRTEC) 5 MG tablet   Oral   Take 5 mg by mouth at bedtime.          . clopidogrel (PLAVIX) 75 MG tablet   Oral   Take 1 tablet (75 mg total) by mouth daily.   30 tablet   0   . docusate sodium (COLACE) 100 MG capsule   Oral   Take 100 mg by mouth at bedtime.         Marland Kitchen doxycycline (VIBRA-TABS) 100 MG tablet   Oral   Take 1 tablet (100 mg total) by mouth every 12 (twelve) hours. Patient not taking: Reported on 01/15/2015   20 tablet   0   . Fluticasone Furoate-Vilanterol (BREO ELLIPTA) 100-25 MCG/INH AEPB   Inhalation   Inhale 1 puff into the lungs daily.   60 each   5   . insulin glargine (LANTUS) 100 UNIT/ML injection   Subcutaneous   Inject 0.45 mLs (45 Units total) into the skin 2 (two) times daily.   10 mL   11   . insulin NPH Human (HUMULIN N,NOVOLIN N) 100 UNIT/ML injection   Subcutaneous   Inject 15 Units into the skin 3 (three) times daily before meals.          Marland Kitchen ipratropium-albuterol (DUONEB) 0.5-2.5 (3) MG/3ML SOLN   Nebulization   Take 3 mLs by nebulization every 4 (four) hours as needed.   360 mL   0   . isosorbide mononitrate (IMDUR) 60 MG 24 hr tablet   Oral   Take 60 mg by mouth daily.          Marland Kitchen levothyroxine (SYNTHROID, LEVOTHROID) 75 MCG tablet   Oral   Take 75 mcg by mouth daily.         Marland Kitchen loperamide (IMODIUM) 2 MG capsule   Oral   Take 2 mg by mouth as needed for diarrhea or loose stools.         . nitroGLYCERIN (NITROSTAT) 0.4 MG SL tablet   Sublingual   Place 0.4 mg under the tongue every 5 (five) minutes as needed for chest pain.         Marland Kitchen nystatin (MYCOSTATIN) 100000 UNIT/ML suspension   Oral   Take 5 mLs (500,000 Units total) by mouth 4 (four) times daily.   60 mL   0   . pantoprazole (PROTONIX) 40 MG tablet   Oral   Take 40 mg by mouth 2 (two) times daily.         . pregabalin (LYRICA) 75 MG capsule    Oral   Take 75 mg by mouth at bedtime.         . ranitidine (ZANTAC) 150 MG tablet   Oral   Take 150 mg by mouth at bedtime.         Marland Kitchen  rOPINIRole (REQUIP) 2 MG tablet   Oral   Take 2 mg by mouth at bedtime.         . sodium chloride (OCEAN) 0.65 % SOLN nasal spray   Each Nare   Place 1 spray into both nostrils as needed for congestion.         . traZODone (DESYREL) 50 MG tablet   Oral   Take 50 mg by mouth at bedtime.         Marland Kitchen venlafaxine XR (EFFEXOR-XR) 37.5 MG 24 hr capsule   Oral   Take 37.5 mg by mouth at bedtime.           Allergies Sulfa antibiotics  Family History  Problem Relation Age of Onset  . Hypertension Other     Social History Social History  Substance Use Topics  . Smoking status: Never Smoker   . Smokeless tobacco: Never Used  . Alcohol Use: No    Review of Systems Constitutional: Negative for fever. Eyes: Negative for visual changes. ENT: Negative for congestion Cardiovascular: Negative for chest pain. Respiratory: Negative for shortness of breath. Gastrointestinal: Negative for abdominal pain, vomiting and diarrhea. Genitourinary: Negative for dysuria. Musculoskeletal: Negative for back pain. Skin: Negative for rash. Neurological: Negative for headaches, focal weakness or numbness.  10-point ROS otherwise negative.  ____________________________________________   PHYSICAL EXAM:  Constitutional: Alert and oriented. Well appearing and in no distress , Somewhat somnolent. Eyes: Normal exam ENT   Head: Normocephalic and atraumatic.   Mouth/Throat: Mucous membranes are moist. Cardiovascular:  Irregular rhythm, rate around 100-110 bpm Respiratory: Normal respiratory effort without tachypnea nor retractions. Breath sounds are clear Gastrointestinal: Soft and nontender. No distention. Musculoskeletal: Nontender with normal range of motion in all extremities. AV fistula to left upper extremity.  Neurologic:  Normal  speech and language. No gross focal neurologic deficits. Able to move all extremities without difficulty.  Skin:  Skin is warm, dry and intact.  Psychiatric: Mood and affect are normal. Speech and behavior are normal.   ____________________________________________    EKG  Due to interpreted by myself shows atrial fibrillation at 112 bpm, narrow QRS, normal axis, frequent ectopic beats, nonspecific ST changes.  ____________________________________________    INITIAL IMPRESSION / ASSESSMENT AND PLAN / ED COURSE  Pertinent labs & imaging results that were available during my care of the patient were reviewed by me and considered in my medical decision making (see chart for details).  Patient presents to the emergency department after dialysis with generalized weakness. Patient has no specific complaints however on review of systems she does note several days of some lower abdominal discomfort with dysuria and hematuria. Patient states she just feels very tired, she is not sure much fluid they took off of her. States she normally gets tired and fatigued after dialysis but not to this extent. We'll check labs, IV hydrate, and closely monitor in the emergency department.  Labs are largely within normal limits. Troponin is slightly elevated, we'll repeat a troponin. Patient continues to state weakness, I clarified with the patient she states it is been going on for 3-4 weeks at this point, states her legs will occasionally get weak. She has talked about this to her primary care physician, but states that never does any good. Patient also complaining of occasional vaginal bleeding. I performed a pelvic examination on the patient she does have a small amount of bleeding at the vaginal introitus, no bleeding within the vaginal vault or cervix. Patient does state occasional sexual  activity, likely abrasion.  Second troponin is unchanged, slight elevation. Patient has had several small runs of  ventricular tachycardia 3 or 4 beats each. Given the patient's continued weakness, blood pressures in the mid 90s to around 100., Now with occasional runs of ventricular tachycardia we will admit to the hospital for generalized weakness. Patient agreeable plan. ____________________________________________   FINAL CLINICAL IMPRESSION(S) / ED DIAGNOSES  Generalized weakness   Minna Antis, MD 03/04/15 2159  Minna Antis, MD 03/04/15 4540  Minna Antis, MD 03/04/15 2326

## 2015-03-04 NOTE — ED Notes (Signed)
SOn [id number]

## 2015-03-04 NOTE — ED Notes (Signed)
Family and patient are requesting for pain medication. Educated on side effects of narcotic pain meds, (low BP). MD notified of patients pressure. Verbal orders to go ahead and given the morphine.

## 2015-03-04 NOTE — ED Notes (Addendum)
Pelvic exam completed by Dr. Lenard Lance with this RN at bedside. Tolerated well.

## 2015-03-05 LAB — TSH: TSH: 10.545 u[IU]/mL — ABNORMAL HIGH (ref 0.350–4.500)

## 2015-03-05 LAB — MRSA PCR SCREENING: MRSA by PCR: NEGATIVE

## 2015-03-05 LAB — GLUCOSE, CAPILLARY
Glucose-Capillary: 166 mg/dL — ABNORMAL HIGH (ref 65–99)
Glucose-Capillary: 148 mg/dL — ABNORMAL HIGH (ref 65–99)
Glucose-Capillary: 171 mg/dL — ABNORMAL HIGH (ref 65–99)
Glucose-Capillary: 150 mg/dL — ABNORMAL HIGH (ref 65–99)

## 2015-03-05 LAB — HEMOGLOBIN A1C: Hgb A1c MFr Bld: 6.8 % — ABNORMAL HIGH (ref 4.0–6.0)

## 2015-03-05 MED ORDER — SODIUM CHLORIDE 0.9 % IV BOLUS (SEPSIS): 250.0000 mL | Freq: Once | INTRAVENOUS | Status: DC

## 2015-03-05 MED ORDER — LORATADINE 10 MG PO TABS
10.0000 mg | ORAL_TABLET | Freq: Every day | ORAL | Status: DC
  Administered 2015-03-05 – 2015-03-06 (×2): 10 mg via ORAL
  Filled 2015-03-05 (×2): qty 1

## 2015-03-05 MED ORDER — CLOPIDOGREL BISULFATE 75 MG PO TABS
75.0000 mg | ORAL_TABLET | Freq: Every day | ORAL | Status: DC
  Administered 2015-03-05 – 2015-03-06 (×2): 75 mg via ORAL
  Filled 2015-03-05 (×2): qty 1

## 2015-03-05 MED ORDER — AQUAPHOR EX OINT
1.0000 "application " | TOPICAL_OINTMENT | CUTANEOUS | Status: DC | PRN
  Filled 2015-03-05: qty 50

## 2015-03-05 MED ORDER — HEPARIN SODIUM (PORCINE) 5000 UNIT/ML IJ SOLN
5000.0000 [IU] | Freq: Three times a day (TID) | INTRAMUSCULAR | Status: DC
  Administered 2015-03-05 – 2015-03-06 (×4): 5000 [IU] via SUBCUTANEOUS
  Filled 2015-03-05 (×4): qty 1

## 2015-03-05 MED ORDER — NEPRO/CARBSTEADY PO LIQD: 237.0000 mL | Freq: Every day | ORAL | Status: DC

## 2015-03-05 MED ORDER — VENLAFAXINE HCL ER 37.5 MG PO CP24
37.5000 mg | ORAL_CAPSULE | Freq: Every day | ORAL | Status: DC
  Administered 2015-03-05 (×2): 37.5 mg via ORAL
  Filled 2015-03-05 (×3): qty 1

## 2015-03-05 MED ORDER — TROLAMINE SALICYLATE 10 % EX CREA
1.0000 "application " | TOPICAL_CREAM | Freq: Four times a day (QID) | CUTANEOUS | Status: DC
  Filled 2015-03-05: qty 85

## 2015-03-05 MED ORDER — LEVOTHYROXINE SODIUM 75 MCG PO TABS
75.0000 ug | ORAL_TABLET | Freq: Every day | ORAL | Status: DC
  Administered 2015-03-05 – 2015-03-06 (×2): 75 ug via ORAL
  Filled 2015-03-05 (×2): qty 1

## 2015-03-05 MED ORDER — ACETAMINOPHEN 650 MG RE SUPP: 650.0000 mg | Freq: Four times a day (QID) | RECTAL | Status: DC | PRN

## 2015-03-05 MED ORDER — ONDANSETRON HCL 4 MG PO TABS: 4.0000 mg | ORAL_TABLET | Freq: Four times a day (QID) | ORAL | Status: DC | PRN

## 2015-03-05 MED ORDER — SODIUM CHLORIDE 0.9 % IV BOLUS (SEPSIS)
250.0000 mL | Freq: Once | INTRAVENOUS | Status: AC
  Administered 2015-03-05: 250 mL via INTRAVENOUS

## 2015-03-05 MED ORDER — MOMETASONE FURO-FORMOTEROL FUM 100-5 MCG/ACT IN AERO
2.0000 | INHALATION_SPRAY | Freq: Two times a day (BID) | RESPIRATORY_TRACT | Status: DC
  Administered 2015-03-05 – 2015-03-06 (×3): 2 via RESPIRATORY_TRACT
  Filled 2015-03-05: qty 8.8

## 2015-03-05 MED ORDER — TRIAMCINOLONE ACETONIDE 0.1 % MT PSTE
1.0000 "application " | PASTE | Freq: Every evening | OROMUCOSAL | Status: DC | PRN
  Filled 2015-03-05: qty 5

## 2015-03-05 MED ORDER — MORPHINE SULFATE (PF) 2 MG/ML IV SOLN: 2.0000 mg | INTRAVENOUS | Status: DC | PRN

## 2015-03-05 MED ORDER — ALBUTEROL SULFATE (2.5 MG/3ML) 0.083% IN NEBU
3.0000 mL | INHALATION_SOLUTION | RESPIRATORY_TRACT | Status: DC | PRN
Start: 2015-03-05 — End: 2015-03-06

## 2015-03-05 MED ORDER — REPLENS VA GEL
1.0000 "application " | VAGINAL | Status: DC | PRN
  Filled 2015-03-05: qty 35

## 2015-03-05 MED ORDER — PREGABALIN 50 MG PO CAPS
75.0000 mg | ORAL_CAPSULE | Freq: Every day | ORAL | Status: DC
  Administered 2015-03-05 (×2): 75 mg via ORAL
  Filled 2015-03-05 (×2): qty 1

## 2015-03-05 MED ORDER — IPRATROPIUM-ALBUTEROL 0.5-2.5 (3) MG/3ML IN SOLN: 3.0000 mL | RESPIRATORY_TRACT | Status: DC | PRN

## 2015-03-05 MED ORDER — INSULIN ASPART 100 UNIT/ML ~~LOC~~ SOLN
0.0000 [IU] | Freq: Three times a day (TID) | SUBCUTANEOUS | Status: DC
  Administered 2015-03-05: 2 [IU] via SUBCUTANEOUS
  Administered 2015-03-06: 1 [IU] via SUBCUTANEOUS
  Filled 2015-03-05: qty 1
  Filled 2015-03-05: qty 2

## 2015-03-05 MED ORDER — NITROGLYCERIN 0.4 MG SL SUBL
0.4000 mg | SUBLINGUAL_TABLET | SUBLINGUAL | Status: DC | PRN
Start: 2015-03-05 — End: 2015-03-06

## 2015-03-05 MED ORDER — LIDOCAINE 5 % EX PTCH
1.0000 | MEDICATED_PATCH | Freq: Every day | CUTANEOUS | Status: DC
  Administered 2015-03-05: 1 via TRANSDERMAL
  Filled 2015-03-05 (×3): qty 1

## 2015-03-05 MED ORDER — MIDODRINE HCL 5 MG PO TABS
5.0000 mg | ORAL_TABLET | Freq: Three times a day (TID) | ORAL | Status: DC
  Administered 2015-03-05 – 2015-03-06 (×4): 5 mg via ORAL
  Filled 2015-03-05 (×4): qty 1

## 2015-03-05 MED ORDER — GLUCOSE 4 G PO CHEW: 1.0000 | CHEWABLE_TABLET | ORAL | Status: DC | PRN

## 2015-03-05 MED ORDER — DOCUSATE SODIUM 100 MG PO CAPS
100.0000 mg | ORAL_CAPSULE | Freq: Two times a day (BID) | ORAL | Status: DC
  Administered 2015-03-05 – 2015-03-06 (×3): 100 mg via ORAL
  Filled 2015-03-05 (×4): qty 1

## 2015-03-05 MED ORDER — ISOSORBIDE MONONITRATE ER 60 MG PO TB24
60.0000 mg | ORAL_TABLET | Freq: Every day | ORAL | Status: DC
  Administered 2015-03-06: 60 mg via ORAL
  Filled 2015-03-05 (×2): qty 1

## 2015-03-05 MED ORDER — SODIUM CHLORIDE 0.9% FLUSH
3.0000 mL | Freq: Two times a day (BID) | INTRAVENOUS | Status: DC
  Administered 2015-03-05 – 2015-03-06 (×3): 3 mL via INTRAVENOUS

## 2015-03-05 MED ORDER — BUDESONIDE 0.25 MG/2ML IN SUSP: 0.2500 mg | Freq: Two times a day (BID) | RESPIRATORY_TRACT | Status: DC

## 2015-03-05 MED ORDER — PSYLLIUM 95 % PO PACK
1.0000 | PACK | Freq: Every day | ORAL | Status: DC
  Filled 2015-03-05 (×3): qty 1

## 2015-03-05 MED ORDER — ACETAMINOPHEN 325 MG PO TABS
650.0000 mg | ORAL_TABLET | Freq: Four times a day (QID) | ORAL | Status: DC | PRN
  Administered 2015-03-06: 650 mg via ORAL
  Filled 2015-03-05: qty 2

## 2015-03-05 MED ORDER — TRAZODONE HCL 50 MG PO TABS
50.0000 mg | ORAL_TABLET | Freq: Every day | ORAL | Status: DC
  Administered 2015-03-05 (×2): 50 mg via ORAL
  Filled 2015-03-05 (×2): qty 1

## 2015-03-05 MED ORDER — POLYVINYL ALCOHOL 1.4 % OP SOLN
1.0000 [drp] | Freq: Three times a day (TID) | OPHTHALMIC | Status: DC
  Administered 2015-03-05 – 2015-03-06 (×3): 1 [drp] via OPHTHALMIC
  Filled 2015-03-05: qty 15

## 2015-03-05 MED ORDER — ASPIRIN EC 81 MG PO TBEC
81.0000 mg | DELAYED_RELEASE_TABLET | Freq: Every day | ORAL | Status: DC
  Administered 2015-03-05 – 2015-03-06 (×2): 81 mg via ORAL
  Filled 2015-03-05 (×2): qty 1

## 2015-03-05 MED ORDER — LOPERAMIDE HCL 2 MG PO CAPS: 2.0000 mg | ORAL_CAPSULE | ORAL | Status: DC | PRN

## 2015-03-05 MED ORDER — CALCIUM ACETATE 667 MG PO CAPS: 667.0000 mg | ORAL_CAPSULE | Freq: Every day | ORAL | Status: DC

## 2015-03-05 MED ORDER — HYDROCODONE-ACETAMINOPHEN 5-325 MG PO TABS: 1.0000 | ORAL_TABLET | ORAL | Status: DC | PRN

## 2015-03-05 MED ORDER — BUDESONIDE 0.25 MG/2ML IN SUSP
0.2500 mg | Freq: Two times a day (BID) | RESPIRATORY_TRACT | Status: DC
  Administered 2015-03-05 – 2015-03-06 (×3): 0.25 mg via RESPIRATORY_TRACT
  Filled 2015-03-05 (×3): qty 2

## 2015-03-05 MED ORDER — ONDANSETRON HCL 4 MG/2ML IJ SOLN
4.0000 mg | Freq: Four times a day (QID) | INTRAMUSCULAR | Status: DC | PRN
  Administered 2015-03-05: 4 mg via INTRAVENOUS
  Filled 2015-03-05: qty 2

## 2015-03-05 MED ORDER — CYCLOBENZAPRINE HCL 10 MG PO TABS
5.0000 mg | ORAL_TABLET | Freq: Once | ORAL | Status: AC
  Administered 2015-03-05: 10 mg via ORAL
  Filled 2015-03-05: qty 1

## 2015-03-05 MED ORDER — FAMOTIDINE 20 MG PO TABS
20.0000 mg | ORAL_TABLET | Freq: Every day | ORAL | Status: DC
  Administered 2015-03-05 (×2): 20 mg via ORAL
  Filled 2015-03-05 (×2): qty 1

## 2015-03-05 MED ORDER — FLUTICASONE PROPIONATE 50 MCG/ACT NA SUSP
2.0000 | Freq: Every day | NASAL | Status: DC
  Administered 2015-03-05 – 2015-03-06 (×2): 2 via NASAL
  Filled 2015-03-05: qty 16

## 2015-03-05 MED ORDER — INSULIN GLARGINE 100 UNIT/ML ~~LOC~~ SOLN
30.0000 [IU] | Freq: Every day | SUBCUTANEOUS | Status: DC
  Administered 2015-03-05 (×2): 30 [IU] via SUBCUTANEOUS
  Filled 2015-03-05 (×3): qty 0.3

## 2015-03-05 MED ORDER — ROPINIROLE HCL 1 MG PO TABS
2.0000 mg | ORAL_TABLET | Freq: Every day | ORAL | Status: DC
  Administered 2015-03-05 (×2): 2 mg via ORAL
  Filled 2015-03-05 (×3): qty 2

## 2015-03-05 MED ORDER — PANTOPRAZOLE SODIUM 40 MG PO TBEC
40.0000 mg | DELAYED_RELEASE_TABLET | Freq: Two times a day (BID) | ORAL | Status: DC
  Administered 2015-03-05 – 2015-03-06 (×3): 40 mg via ORAL
  Filled 2015-03-05 (×3): qty 1

## 2015-03-05 MED ORDER — CALCIUM ACETATE 667 MG PO CAPS
667.0000 mg | ORAL_CAPSULE | Freq: Three times a day (TID) | ORAL | Status: DC
  Administered 2015-03-05 – 2015-03-06 (×3): 667 mg via ORAL
  Filled 2015-03-05 (×7): qty 1

## 2015-03-05 MED ORDER — BENZONATATE 100 MG PO CAPS: 100.0000 mg | ORAL_CAPSULE | Freq: Three times a day (TID) | ORAL | Status: DC | PRN

## 2015-03-05 MED ORDER — SODIUM CHLORIDE 0.9 % IV SOLN
INTRAVENOUS | Status: DC
  Administered 2015-03-05: 02:00:00 via INTRAVENOUS

## 2015-03-05 NOTE — Progress Notes (Signed)
DR. Sheryle Hail paged concerning NS running at 90 mL/hour since patient is a dialysis patient. Dr. Sheryle Hail indicated that patient is dehydrated and as it should continue to infuse at 90 mL/hour. Patient is resting quietly with her eyes closed, respirations even and unlabored. Will continue to monitor.

## 2015-03-05 NOTE — Progress Notes (Signed)
Patient is admitted to room 252 with a diagnosis of generalized weakness.Patient is alert and oriented x 4. Patient is oriented to her room, staff, call bell/ascom. Telemetry box verified by the RN and the NT. Fall risk contract signed. Skin assessment done with Yasmin S. RN. Noted dry flaky skin, scabs on right lower extremities and bilateral LE edema + 2.. No acute distress noted.

## 2015-03-05 NOTE — Evaluation (Addendum)
Occupational Therapy Evaluation Patient Details Name: Savannah Key MRN: [id number] DOB: December 30, 1940 Today's Date: 03/05/2015    History of Present Illness Savannah Key is a 75 year old female with a past medical history of end-stage renal disease on hemodialysis Monday, Wednesday, Friday, hypothyroidism, diabetes, depression, liver failure, MI in the past, hypertension, CHF, who came to [facility]  with generalized weakness. According to the patient she received her full dialysis session yesterday and stated after dialysis she always feels weak but today the weakness was worse than normal. Patient is also complaining of some mild lower abdominal discomfort with dysuria over the past several days. It is also noticed a small amount of blood in her urine. Denies any chest pain, cough or congestion. Denies fever. Denies nausea or vomiting. Her main complaint today is generalized weakness which began after dialysis today.   Clinical Impression   This patient is a 75 year old female who came [facility] ED with the above history. She lives with her spouse in a one story home with a ramp entrance. She has a rolling walker, tub bench, and manual wheel chair (she does not use wheel chair). She had been independent with BADL and functional mobility with help with higher level activities. Although she has good upper extremity strength, her endurance is low. She reports shortness of breath when dressing lower body. She has deficits in endurance and activities of daily living and would benefit from Occupational Therapy for ADL/functional mobility training energy saving techniques and assistive devices.     Follow Up Recommendations  SNF    Equipment Recommendations       Recommendations for Other Services       Precautions / Restrictions Precautions Precautions: Fall Restrictions Weight Bearing Restrictions: No      Mobility Bed Mobility                  Transfers                      Balance                                            ADL                                         General ADL Comments: Had been independent with BADL needing assist with higher level ADL. She now needs assist with BADL.  Taught patient hip kit to prevent shortness of breath during lower body dressing and practiced techniques for their use hand over hand assist. Needed physical and verbal cues for technique and safety. Given written list of vendors who have hip kits.  Also reviewed energy saving techniques during activities of daily living.     Vision     Perception     Praxis      Pertinent Vitals/Pain Pain Assessment:  (I have spasms.)     Hand Dominance     Extremity/Trunk Assessment Upper Extremity Assessment Upper Extremity Assessment:  (suprizingly 5/5 for both UE's but endurance is low.)   Lower Extremity Assessment Lower Extremity Assessment: Defer to PT evaluation       Communication Communication Communication: No difficulties   Cognition Arousal/Alertness:  (Falls asleep easily.)  Behavior During Therapy: WFL for tasks assessed/performed Overall Cognitive Status: Within Functional Limits for tasks assessed                     General Comments       Exercises       Shoulder Instructions      Home Living Family/patient expects to be discharged to:: Private residence Living Arrangements: Spouse/significant other Available Help at Discharge: Family Type of Home: House Home Access: Ramped entrance     [place]: One level     Bathroom Shower/Tub: Tub/shower unit         Home Equipment: Environmental consultant - 4 wheels;Tub bench;Wheelchair - manual          Prior Functioning/Environment          Comments: modified inedependent with BADL, needs assist with higher level ADL. She is in [facility] program.    OT Diagnosis: Generalized weakness (good strength,  but poor endurance.)   OT Problem List: Decreased activity tolerance;Decreased knowledge of use of DME or AE   OT Treatment/Interventions: Self-care/ADL training;DME and/or AE instruction    OT Goals(Current goals can be found in the care plan section) Acute Rehab OT Goals Patient Stated Goal: To be able to do my every day tasks. OT Goal Formulation: With patient Time For Goal Achievement: 03/19/15 Potential to Achieve Goals: Good  OT Frequency: Min 1X/week   Barriers to D/C:            Co-evaluation              End of Session Equipment Utilized During Treatment:  (hip kit)  Activity Tolerance: Patient limited by lethargy Patient left: in bed;with call bell/phone within reach;with bed alarm set   Time: 0086-7619 OT Time Calculation (min): 20 min Charges:  OT General Charges $OT Visit: 1 Procedure OT Evaluation $OT Eval Low Complexity: 1 Procedure OT Treatments $Self Care/Home Management : 8-22 mins G-Codes: OT G-codes **NOT FOR INPATIENT CLASS** Functional Assessment Tool Used: clinical judgement Functional Limitation: Self care Self Care Current Status (J0932): At least 60 percent but less than 80 percent impaired, limited or restricted Self Care Goal Status (I7124): At least 20 percent but less than 40 percent impaired, limited or restricted  Myrene Galas, MS/OTR/L  03/05/2015, 4:03 PM

## 2015-03-05 NOTE — Care Management (Signed)
Patient  is followed by [facility].  She also has esrd and hemodialysis MWF in [place].   Agency can transport home if discharges by 1pm.  She lives at home with her husband.  [facility] can provide any rehab services such as physical  therapy or occupational therapy.  Placed in observation for generalized weakness.  [facility] is aware of admission

## 2015-03-05 NOTE — Progress Notes (Signed)
Dr. Wynelle Link rounding at this time. Updated on patient. Orders to still give 250cc bolus over an hour. Continuous fluids will be stopped and he will adjust meds. Will continue to monitor.

## 2015-03-05 NOTE — Progress Notes (Signed)
[facility]  ROUNDING NOTE   Subjective:   Admitted overnight for weakness.  Got hemodialysis yesterday as an outpatient. Today complains of hypotension.   Objective:  Vital signs in last 24 hours:  Temp:  [98 F (36.7 C)-98.3 F (36.8 C)] 98.3 F (36.8 C) (02/14 0512) Pulse Rate:  [37-120] 57 (02/14 0927) Resp:  [16-37] 16 (02/14 0512) BP: (77-109)/(31-70) 85/31 mmHg (02/14 0927) SpO2:  [94 %-100 %] 100 % (02/14 0512) Weight:  [89.54 kg (197 lb 6.4 oz)-89.812 kg (198 lb)] 89.54 kg (197 lb 6.4 oz) (02/14 0135)  Weight change:  Filed Weights   03/04/15 1827 03/05/15 0135  Weight: 89.812 kg (198 lb) 89.54 kg (197 lb 6.4 oz)    Intake/Output: I/O last 3 completed shifts: In: 472.5 [I.V.:472.5] Out: 0    Intake/Output this shift:     Physical Exam: General: NAD, laying in bed  Head: Normocephalic, atraumatic. Moist oral mucosal membranes  Eyes: Anicteric, PERRL  Neck: Supple, trachea midline  Lungs:  Clear to auscultation  Heart: Regular rate and rhythm  Abdomen:  Soft, nontender,   Extremities: no peripheral edema.  Neurologic: Nonfocal, moving all four extremities  Skin: +tenting  Access: LUE AVF    Basic Metabolic Panel:  Recent Labs Lab 03/04/15 1902  NA 136  K 4.0  CL 97*  CO2 26  GLUCOSE 171*  BUN 23*  CREATININE 3.23*  CALCIUM 8.4*  MG 2.3    Liver Function Tests:  Recent Labs Lab 03/04/15 1902  AST 51*  ALT 28  ALKPHOS 113  BILITOT 0.7  PROT 6.4*  ALBUMIN 3.5   No results for input(s): LIPASE, AMYLASE in the last 168 hours. No results for input(s): AMMONIA in the last 168 hours.  CBC:  Recent Labs Lab 03/04/15 1902  WBC 11.1*  HGB 8.0*  HCT 26.9*  MCV 81.5  PLT 134*    Cardiac Enzymes:  Recent Labs Lab 03/04/15 1902 03/04/15 2100  TROPONINI 0.13* 0.15*    BNP: Invalid input(s): POCBNP  CBG:  Recent Labs Lab 03/05/15 0133  GLUCAP 166*    Microbiology: Results for orders placed or performed  during the hospital encounter of 03/04/15  MRSA PCR Screening     Status: None   Collection Time: 03/05/15  8:09 AM  Result Value Ref Range Status   MRSA by PCR NEGATIVE NEGATIVE Final    Comment:        The GeneXpert MRSA Assay (FDA approved for NASAL specimens only), is one component of a comprehensive MRSA colonization surveillance program. It is not intended to diagnose MRSA infection nor to guide or monitor treatment for MRSA infections.     Coagulation Studies: No results for input(s): LABPROT, INR in the last 72 hours.  Urinalysis:  Recent Labs  03/04/15 2010  COLORURINE AMBER*  LABSPEC 1.020  PHURINE 5.0  GLUCOSEU NEGATIVE  HGBUR NEGATIVE  BILIRUBINUR 1+*  KETONESUR TRACE*  PROTEINUR 30*  NITRITE NEGATIVE  LEUKOCYTESUR 1+*      Imaging: No results found.   Medications:     . aspirin EC  81 mg Oral Daily  . budesonide  0.25 mg Nebulization BID  . calcium acetate  667 mg Oral Daily  . clopidogrel  75 mg Oral Daily  . docusate sodium  100 mg Oral BID  . famotidine  20 mg Oral QHS  . feeding supplement (NEPRO CARB STEADY)  237 mL Oral Daily  . fluticasone  2 spray Each Nare Daily  . heparin  5,000 Units Subcutaneous 3 times per day  . insulin aspart  0-9 Units Subcutaneous TID WC  . insulin glargine  30 Units Subcutaneous QHS  . isosorbide mononitrate  60 mg Oral Daily  . levothyroxine  75 mcg Oral QAC breakfast  . lidocaine  1 patch Transdermal QHS  . loratadine  10 mg Oral Daily  . midodrine  5 mg Oral TID WC  . mometasone-formoterol  2 puff Inhalation BID  . pantoprazole  40 mg Oral BID  . polyvinyl alcohol  1 drop Both Eyes TID  . pregabalin  75 mg Oral QHS  . psyllium  1 packet Oral Daily  . rOPINIRole  2 mg Oral QHS  . sodium chloride flush  3 mL Intravenous Q12H  . traZODone  50 mg Oral QHS  . trolamine salicylate  1 application Topical QID  . venlafaxine XR  37.5 mg Oral QHS   acetaminophen **OR** acetaminophen, albuterol,  benzonatate, glucose, ipratropium-albuterol, loperamide, mineral oil-hydrophilic petrolatum, morphine injection, nitroGLYCERIN, ondansetron **OR** ondansetron (ZOFRAN) IV, REPLENS, triamcinolone  Assessment/ Plan:  [name] is a 75 y.o. white female  with end-stage renal disease, carotid stenosis, coronary artery disease, diabetes type 2, history of myocardial infarction, hypertension, peripheral vascular disease.  MWF [facility] [place] [facility]  1. ESRD on HD ZOX:[id number] hemodialysis yesterday. Plan on next treatment for tomorrow.   2. Anemia of CKD: hemoglobin of 8.  - continue Epogen 10,000 units IV with dialysis while inpatient. Gets micera as outpatient.   3. Secondary Hyperparathyroidism:  - calcium acetate with meals  4. Hypotension: given NS overnight - start midodrine  tid - also take imdur for coronary artery disease   LOS:  Savannah Key 2/14/201710:50 AM

## 2015-03-05 NOTE — Progress Notes (Signed)
BP checked three times, BP=77/41, 81/45, 105/36, Dr. Sheryle Hail notified but no new order received. Patient is asymptomatic, very talkative. Will continue to monitor.

## 2015-03-05 NOTE — Progress Notes (Signed)
[facility] [place] at [facility]   PATIENT NAME: Savannah Key    MR#:  [id number]  DATE OF BIRTH:  April 04, 1940  SUBJECTIVE:   Patient is here due to generalized weakness and noted to be mildly hypotensive. Feels weak and fatigued. No other complaints presently.  REVIEW OF SYSTEMS:    Review of Systems  Constitutional: Positive for malaise/fatigue. Negative for fever and chills.  HENT: Negative for congestion and tinnitus.   Eyes: Negative for blurred vision and double vision.  Respiratory: Negative for cough, shortness of breath and wheezing.   Cardiovascular: Negative for chest pain, orthopnea and PND.  Gastrointestinal: Negative for nausea, vomiting, abdominal pain and diarrhea.  Genitourinary: Negative for dysuria and hematuria.  Neurological: Positive for weakness (generalized). Negative for dizziness, sensory change and focal weakness.  All other systems reviewed and are negative.   Nutrition: Heart healthy/carb control. Tolerating Diet: Yes Tolerating PT: Evaluation noted   DRUG ALLERGIES:   Allergies  Allergen Reactions  . Sulfa Antibiotics Other (See Comments)    Reaction:  Blood in the urine     VITALS:  Blood pressure 96/67, pulse 61, temperature 98.6 F (37 C), temperature source Oral, resp. rate 18, height 5' (1.524 m), weight 89.54 kg (197 lb 6.4 oz), SpO2 94 %.  PHYSICAL EXAMINATION:   Physical Exam  GENERAL:  75 y.o.-year-old patient lying in the bed in no acute distress.  EYES: Pupils equal, round, reactive to light and accommodation. No scleral icterus. Extraocular muscles intact.  HEENT: Head atraumatic, normocephalic. Oropharynx and nasopharynx clear.  NECK:  Supple, no jugular venous distention. No thyroid enlargement, no tenderness.  LUNGS: Normal breath sounds bilaterally, no wheezing, rales, rhonchi. No use of accessory muscles of respiration.  CARDIOVASCULAR: S1, S2 normal. No murmurs, rubs, or gallops.  ABDOMEN:  Soft, nontender, nondistended. Bowel sounds present. No organomegaly or mass.  EXTREMITIES: No cyanosis, clubbing, +1 edema b/l.   NEUROLOGIC: Cranial nerves II through XII are intact. No focal Motor or sensory deficits b/l.  Globally weak PSYCHIATRIC: The patient is alert and oriented x 3.  SKIN: No obvious rash, lesion, or ulcer.   Left upper extremity AV fistula with good bruit and good flow.  LABORATORY PANEL:   CBC  Recent Labs Lab 03/04/15 1902  WBC 11.1*  HGB 8.0*  HCT 26.9*  PLT 134*   ------------------------------------------------------------------------------------------------------------------  Chemistries   Recent Labs Lab 03/04/15 1902  NA 136  K 4.0  CL 97*  CO2 26  GLUCOSE 171*  BUN 23*  CREATININE 3.23*  CALCIUM 8.4*  MG 2.3  AST 51*  ALT 28  ALKPHOS 113  BILITOT 0.7   ------------------------------------------------------------------------------------------------------------------  Cardiac Enzymes  Recent Labs Lab 03/04/15 2100  TROPONINI 0.15*   ------------------------------------------------------------------------------------------------------------------  RADIOLOGY:  No results found.   ASSESSMENT AND PLAN:   75 year old female with past medical history of end-stage renal disease on hemodialysis, osteoarthritis, GERD, carotid artery disease, hypothyroidism, diabetes, depression, history of previous MI, history of CHF who presented to the hospital due to generalized weakness and fatigue.  #1 generalized weakness/fatigue-etiology unclear. No evidence of acute infectious source or any metabolic source. She was noted to be mildly hypotensive and may be related to orthostasis. -Patient has been started back on her home dose Midodrin and will follow hemodynamics. -Seen by physical therapy and recommended short-term rehabilitation.  #2 end-stage renal disease on hemodialysis-nephrology has been consulted.  -continue dialysis Monday  Wednesday and Friday.  #3 diabetes type 2 without complication-continue Lantus,  sliding scale insulin.  #4 hypothyroidism-continue Synthroid.  #5 secondary hyperparathyroidism-continue PhosLo.  #6 COPD-no acute exacerbation. Continue Dulera, DuoNeb nebs, Pulmicort nebs.  #7 diabetic neuropathy-continue Lyrica.  #8 history of coronary artery disease-continue aspirin, Plavix, Imdur.  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  #10 depression-continue Effexor.  All the records are reviewed and case discussed with Care Management/Social Workerr. Management plans discussed with the patient, family and they are in agreement.  CODE STATUS: Full  DVT Prophylaxis: Heparin subcutaneous  TOTAL TIME TAKING CARE OF THIS PATIENT: 30 minutes.   POSSIBLE D/C IN 1-2 DAYS, DEPENDING ON CLINICAL CONDITION.   Houston Siren M.D on 03/05/2015 at 2:43 PM  Between 7am to 6pm - Pager - [phone]  After 6pm go to [email or web address] - password [email or web address] [facility]  [place] [facility]  Office  [phone]  CC: Primary care physician; Bobbye Morton, MD

## 2015-03-05 NOTE — Progress Notes (Addendum)
Dr. Cherlynn Kaiser notified of low BP. No new complaints since admission, still feeling generalized weakness and fatigue; very drowsy but awakens easily. States her SBP at home is usually low 100's. Order to give 250cc NS bolus. Will continue to monitor.

## 2015-03-05 NOTE — Progress Notes (Signed)
Per patient she has not been having any dark or bloody stools. She states she will occasionally see red spots when she wipes after urinating - she thinks her pull-ups might have been irritating her skin at home. No signs of bleeding at this time. Will continue to monitor.

## 2015-03-05 NOTE — Evaluation (Signed)
Physical Therapy Evaluation Patient Details Name: PALMINA CLODFELTER MRN: [id number] DOB: Jun 21, 1940 Today's Date: 03/05/2015   History of Present Illness  75 yo F presented to ER due to progressive weakness leading to inability to ambulate. She has frequent episodes of "falling asleep" for a few seconds. PMH of ESRD and gets dialysis MWF, CHF, CAD,  restless leg syndrom and MI.   Clinical Impression  Pt presents with generalized weakness that has progressed over the past couple of weeks. She is limited in functional mobility and is at risk of falling. Performed transfers and limited ambulation with FWW and minA +2 for safety in the event that pt's knees buckle. She demonstrated frequent episodes of "falling asleep" for about 5 seconds, requiring cues for staying on task. Pt is unsafe to perform mobility tasks due to risk of falls. Pt will benefit from skilled PT services to increase functional I and mobility for safe discharge.     Follow Up Recommendations SNF;Supervision for mobility/OOB    Equipment Recommendations  Rolling walker with 5" wheels    Recommendations for Other Services       Precautions / Restrictions Precautions Precautions: Fall;Other (comment) (No BP L UE.) Restrictions Weight Bearing Restrictions: No      Mobility  Bed Mobility Overal bed mobility: Needs Assistance Bed Mobility: Supine to Sit     Supine to sit: Min assist     General bed mobility comments: HOB elevated, rail  Transfers Overall transfer level: Needs assistance Equipment used: Rolling walker (2 wheeled) Transfers: Sit to/from [employer] Sit to Stand: Min assist;+2 physical assistance (due to buckling knees potential) Stand pivot transfers: Min assist;+2 physical assistance (due to potential for buckling knees)       General transfer comment: VC for safety  Ambulation/Gait Ambulation/Gait assistance: Min guard;+2 physical assistance Ambulation Distance (Feet): 5  Feet Assistive device: Rolling walker (2 wheeled) Gait Pattern/deviations: Decreased stride length;Narrow base of support     General Gait Details: Limited distance performed due to weakness and knee buckling  Stairs            Wheelchair Mobility    Modified Rankin (Stroke Patients Only)       Balance Overall balance assessment: Needs assistance Sitting-balance support: Bilateral upper extremity supported;Feet supported Sitting balance-Leahy Scale: Fair Sitting balance - Comments: VC for postural correction Postural control: Posterior lean Standing balance support: Bilateral upper extremity supported Standing balance-Leahy Scale: Fair Standing balance comment: Maintains balance, +2 assistance incase of knee buckling                             Pertinent Vitals/Pain Pain Assessment:  (yes, unable to rate, "my legs hurt")    Home Living Family/patient expects to be discharged to:: Private residence Living Arrangements: Spouse/significant other Available Help at Discharge: Family Type of Home: House Home Access: Ramped entrance     Home Layout: One level Home Equipment: Environmental consultant - 4 wheels;Tub bench;Wheelchair - manual Additional Comments: Pt states she will be unable to get into bathroom with RW.    Prior Function Level of Independence: Independent with assistive device(s)         Comments: Indep for household and limited community distances with [phone]; denies fall history.  Active with [facility] program and attends groups at facility 2 days/week     Hand Dominance        Extremity/Trunk Assessment   Upper Extremity Assessment: Generalized weakness  Lower Extremity Assessment: Generalized weakness         Communication   Communication: No difficulties  Cognition Arousal/Alertness: Awake/alert;Lethargic (falls asleep for a few seconds frequently during session) Behavior During Therapy: WFL for tasks assessed/performed Overall  Cognitive Status: Within Functional Limits for tasks assessed                      General Comments General comments (skin integrity, edema, etc.): R lower leg has two small bleeding sores, RN notified.    Exercises Other Exercises Other Exercises: B LE therex: ankle pumps, LAQs, marching x10 with frequent VC for staying on task and technique. Occassionally "falls asleep" for a few seconds.       Assessment/Plan    PT Assessment Patient needs continued PT services  PT Diagnosis Difficulty walking;Generalized weakness   PT Problem List Decreased strength;Decreased activity tolerance;Decreased balance;Decreased mobility  PT Treatment Interventions Gait training;Therapeutic activities;Therapeutic exercise;Balance training;Patient/family education   PT Goals (Current goals can be found in the Care Plan section) Acute Rehab PT Goals Patient Stated Goal: To be able to get up and do things. PT Goal Formulation: With patient Time For Goal Achievement: 03/19/15 Potential to Achieve Goals: Fair    Frequency Min 2X/week   Barriers to discharge Inaccessible home environment;Decreased caregiver support      Co-evaluation               End of Session Equipment Utilized During Treatment: Gait belt Activity Tolerance: Patient tolerated treatment well;Patient limited by fatigue;Patient limited by lethargy Patient left: in chair;with call bell/phone within reach;with chair alarm set Nurse Communication: Mobility status;Other (comment) (episodes of "falling asleep", R leg sores)    Functional Assessment Tool Used: clinical judgement Functional Limitation: Mobility: Walking and moving around Mobility: Walking and Moving Around Current Status (Z6109): At least 60 percent but less than 80 percent impaired, limited or restricted Mobility: Walking and Moving Around Goal Status [phone]): At least 20 percent but less than 40 percent impaired, limited or restricted    Time:  1120-1150 PT Time Calculation (min) (ACUTE ONLY): 30 min   Charges:   PT Evaluation $PT Eval Moderate Complexity: 1 Procedure PT Treatments $Therapeutic Exercise: 8-22 mins   PT G Codes:   PT G-Codes **NOT FOR INPATIENT CLASS** Functional Assessment Tool Used: clinical judgement Functional Limitation: Mobility: Walking and moving around Mobility: Walking and Moving Around Current Status (U9811): At least 60 percent but less than 80 percent impaired, limited or restricted Mobility: Walking and Moving Around Goal Status [phone]): At least 20 percent but less than 40 percent impaired, limited or restricted    Adelene Idler, PT, DPT  03/05/2015, 1:24 PM [phone]

## 2015-03-05 NOTE — H&P (Signed)
Savannah Key is an 75 y.o. female.   Chief Complaint: Generalized weakness HPI: The patient presents emergency department complaining of weakness in her lower extremities. The patient states that she be began feeling as if she wouldn't be able to support her weight at church the other day. She was able to intubate with her walker but became progressively weaker today. She describes pain in her lower extremities which is a chronic complaint but apparently worse with this emergency department visit. She was able to attend dialysis this morning but did not feel any better. She denies fever, nausea or vomiting. The patient admits to decreased appetite however. As the patient was unable to ambulate emergency department staff called the hospitalist service for further evaluation and treatment.  Past Medical History  Diagnosis Date  . Chronic kidney disease   . GERD (gastroesophageal reflux disease)   . Carotid artery stenosis   . Coronary artery disease   . Hypothyroidism   . Diabetes mellitus without complication ([place])   . Depression   . Arthritis   . Kidney failure   . Liver failure ([place])   . MI (myocardial infarction) ([place])   . HTN (hypertension)   . Broken leg     R  . CHF (congestive heart failure) [facility])     Past Surgical History  Procedure Laterality Date  . Av fistula placement Left   . Cholecystectomy    . Peripheral vascular catheterization N/A 06/11/2014    Procedure: A/V Shuntogram/Fistulagram;  Surgeon: Algernon Huxley, MD;  Location: [place] [facility];  Service: Cardiovascular;  Laterality: N/A;  . Peripheral vascular catheterization Left 06/11/2014    Procedure: A/V Shunt Intervention;  Surgeon: Algernon Huxley, MD;  Location: [place] [facility];  Service: Cardiovascular;  Laterality: Left;  . Open reduction internal fixation (orif) distal radial fracture Left 08/09/2014    Procedure: OPEN REDUCTION INTERNAL FIXATION (ORIF) DISTAL RADIAL FRACTURE;  Surgeon: Hessie Knows, MD;   Location: [facility];  Service: Orthopedics;  Laterality: Left;  . Peripheral vascular catheterization Left 11/06/2014    Procedure: A/V Shuntogram/Fistulagram;  Surgeon: Katha Cabal, MD;  Location: [place] [facility];  Service: Cardiovascular;  Laterality: Left;  . Peripheral vascular catheterization Left 11/06/2014    Procedure: A/V Shunt Intervention;  Surgeon: Katha Cabal, MD;  Location: [place] [facility];  Service: Cardiovascular;  Laterality: Left;  . Peripheral vascular catheterization Left 01/15/2015    Procedure: A/V Shuntogram/Fistulagram;  Surgeon: Katha Cabal, MD;  Location: [place] [facility];  Service: Cardiovascular;  Laterality: Left;  . Peripheral vascular catheterization N/A 01/15/2015    Procedure: A/V Shunt Intervention;  Surgeon: Katha Cabal, MD;  Location: [place] [facility];  Service: Cardiovascular;  Laterality: N/A;    Family History  Problem Relation Age of Onset  . Hypertension Other    Social History:  reports that she has never smoked. She has never used smokeless tobacco. She reports that she does not drink alcohol or use illicit drugs.  Allergies:  Allergies  Allergen Reactions  . Sulfa Antibiotics Other (See Comments)    Reaction:  Blood in the urine     Medications Prior to Admission  Medication Sig Dispense Refill  . albuterol (PROVENTIL HFA;VENTOLIN HFA) 108 (90 Base) MCG/ACT inhaler Inhale 2 puffs into the lungs every 4 (four) hours as needed for shortness of breath.    Marland Kitchen alendronate (FOSAMAX) 70 MG tablet Take 70 mg by mouth once a week. Take with a  full glass of water on an empty stomach. Patient takes on Monday morning    . aspirin EC 81 MG tablet Take 81 mg by mouth daily.     . benzonatate (TESSALON) 100 MG capsule Take 100 mg by mouth 3 (three) times daily as needed for cough.    . budesonide (PULMICORT) 0.25 MG/2ML nebulizer solution Take 2 mLs (0.25 mg total) by nebulization 2 (two) times daily. 60 mL 12  .  calcium acetate (PHOSLO) 667 MG capsule Take 667 mg by mouth daily.     . Carboxymethylcellul-Glycerin 0.5-0.9 % SOLN Place 1 drop into both eyes 3 (three) times daily.    . cephALEXin (KEFLEX) 250 MG capsule Take 250 mg by mouth at bedtime.    . cetirizine (ZYRTEC) 5 MG tablet Take 5 mg by mouth at bedtime.     . clopidogrel (PLAVIX) 75 MG tablet Take 1 tablet (75 mg total) by mouth daily. 30 tablet 0  . fluticasone (FLONASE) 50 MCG/ACT nasal spray Place 2 sprays into both nostrils daily.    . Fluticasone Furoate-Vilanterol (BREO ELLIPTA) 100-25 MCG/INH AEPB Inhale 1 puff into the lungs daily. 60 each 5  . glucose 4 GM chewable tablet Chew 1 tablet by mouth as needed for low blood sugar.    . insulin aspart (NOVOLOG FLEXPEN) 100 UNIT/ML FlexPen Inject 12 Units into the skin 3 (three) times daily with meals.    . insulin glargine (LANTUS) 100 UNIT/ML injection Inject 0.45 mLs (45 Units total) into the skin 2 (two) times daily. (Patient taking differently: Inject 42 Units into the skin 2 (two) times daily. ) 10 mL 11  . ipratropium-albuterol (DUONEB) 0.5-2.5 (3) MG/3ML SOLN Take 3 mLs by nebulization every 4 (four) hours as needed. 360 mL 0  . isosorbide mononitrate (IMDUR) 60 MG 24 hr tablet Take 60 mg by mouth daily.     Marland Kitchen levothyroxine (SYNTHROID, LEVOTHROID) 75 MCG tablet Take 75 mcg by mouth daily.    Marland Kitchen lidocaine (LIDODERM) 5 % Place 1 patch onto the skin daily. Remove & Discard patch within 12 hours or as directed by MD    . loperamide (IMODIUM) 2 MG capsule Take 2-4 mg by mouth as needed for diarrhea or loose stools.     . mineral oil-hydrophilic petrolatum (AQUAPHOR) ointment Apply 1 application topically as needed for dry skin.    Marland Kitchen nitroGLYCERIN (NITROSTAT) 0.4 MG SL tablet Place 0.4 mg under the tongue every 5 (five) minutes as needed for chest pain.    . Nutritional Supplements (FEEDING SUPPLEMENT, NEPRO CARB STEADY,) LIQD Take 237 mLs by mouth daily.    . pantoprazole (PROTONIX) 40 MG  tablet Take 40 mg by mouth 2 (two) times daily.    . pregabalin (LYRICA) 75 MG capsule Take 75 mg by mouth at bedtime.    . psyllium (METAMUCIL SMOOTH TEXTURE) 28 % packet Take 1 packet by mouth daily.    . ranitidine (ZANTAC) 150 MG tablet Take 150 mg by mouth at bedtime.    Marland Kitchen rOPINIRole (REQUIP) 2 MG tablet Take 2 mg by mouth at bedtime.    . traZODone (DESYREL) 50 MG tablet Take 50 mg by mouth at bedtime.    . triamcinolone (KENALOG) 0.1 % paste Use as directed 1 application in the mouth or throat at bedtime as needed (gum irritation).    . trolamine salicylate (ASPERCREME) 10 % cream Apply 1 application topically 4 (four) times daily.    . Vaginal Lubricant (REPLENS) GEL Place 1 application  vaginally as needed (vaginal dryness).    . venlafaxine XR (EFFEXOR-XR) 37.5 MG 24 hr capsule Take 37.5 mg by mouth at bedtime.      Results for orders placed or performed during the hospital encounter of 03/04/15 (from the past 48 hour(s))  CBC     Status: Abnormal   Collection Time: 03/04/15  7:02 PM  Result Value Ref Range   WBC 11.1 (H) 3.6 - 11.0 K/uL   RBC 3.30 (L) 3.80 - 5.20 MIL/uL   Hemoglobin 8.0 (L) 12.0 - 16.0 g/dL   HCT 26.9 (L) 35.0 - 47.0 %   MCV 81.5 80.0 - 100.0 fL   MCH 24.3 (L) 26.0 - 34.0 pg   MCHC 29.8 (L) 32.0 - 36.0 g/dL   RDW 22.5 (H) 11.5 - 14.5 %   Platelets 134 (L) 150 - 440 K/uL  Comprehensive metabolic panel     Status: Abnormal   Collection Time: 03/04/15  7:02 PM  Result Value Ref Range   Sodium 136 135 - 145 mmol/L   Potassium 4.0 3.5 - 5.1 mmol/L   Chloride 97 (L) 101 - 111 mmol/L   CO2 26 22 - 32 mmol/L   Glucose, Bld 171 (H) 65 - 99 mg/dL   BUN 23 (H) 6 - 20 mg/dL   Creatinine, Ser 3.23 (H) 0.44 - 1.00 mg/dL   Calcium 8.4 (L) 8.9 - 10.3 mg/dL   Total Protein 6.4 (L) 6.5 - 8.1 g/dL   Albumin 3.5 3.5 - 5.0 g/dL   AST 51 (H) 15 - 41 U/L   ALT 28 14 - 54 U/L   Alkaline Phosphatase 113 38 - 126 U/L   Total Bilirubin 0.7 0.3 - 1.2 mg/dL   GFR calc non Af  Amer 13 (L) >60 mL/min   GFR calc Af Amer 15 (L) >60 mL/min    Comment: (NOTE) The eGFR has been calculated using the CKD EPI equation. This calculation has not been validated in all clinical situations. eGFR's persistently <60 mL/min signify possible Chronic Kidney Disease.    Anion gap 13 5 - 15  Troponin I     Status: Abnormal   Collection Time: 03/04/15  7:02 PM  Result Value Ref Range   Troponin I 0.13 (H) <0.031 ng/mL    Comment: READ BACK AND VERIFIED WITH EMMA HUNTER AT 1945 03/04/15 [facility]        PERSISTENTLY INCREASED TROPONIN VALUES IN THE RANGE OF 0.04-0.49 ng/mL CAN BE SEEN IN:       -UNSTABLE ANGINA       -CONGESTIVE HEART FAILURE       -MYOCARDITIS       -CHEST TRAUMA       -ARRYHTHMIAS       -LATE PRESENTING MYOCARDIAL INFARCTION       -COPD   CLINICAL FOLLOW-UP RECOMMENDED.   Magnesium     Status: None   Collection Time: 03/04/15  7:02 PM  Result Value Ref Range   Magnesium 2.3 1.7 - 2.4 mg/dL  TSH     Status: Abnormal   Collection Time: 03/04/15  7:02 PM  Result Value Ref Range   TSH 10.545 (H) 0.350 - 4.500 uIU/mL  Urinalysis complete, with microscopic ([facility] only)     Status: Abnormal   Collection Time: 03/04/15  8:10 PM  Result Value Ref Range   Color, Urine AMBER (A) YELLOW   APPearance HAZY (A) CLEAR   Glucose, UA NEGATIVE NEGATIVE mg/dL   Bilirubin Urine 1+ (A) NEGATIVE  Ketones, ur TRACE (A) NEGATIVE mg/dL   Specific Gravity, Urine 1.020 1.005 - 1.030   Hgb urine dipstick NEGATIVE NEGATIVE   pH 5.0 5.0 - 8.0   Protein, ur 30 (A) NEGATIVE mg/dL   Nitrite NEGATIVE NEGATIVE   Leukocytes, UA 1+ (A) NEGATIVE   RBC / HPF 0-5 0 - 5 RBC/hpf   WBC, UA 0-5 0 - 5 WBC/hpf   Bacteria, UA RARE (A) NONE SEEN   Squamous Epithelial / LPF 0-5 (A) NONE SEEN  Troponin I     Status: Abnormal   Collection Time: 03/04/15  9:00 PM  Result Value Ref Range   Troponin I 0.15 (H) <0.031 ng/mL    Comment: PREVIOUS RESULT CALLED AT 1945 03/04/15 [facility]         PERSISTENTLY INCREASED TROPONIN VALUES IN THE RANGE OF 0.04-0.49 ng/mL CAN BE SEEN IN:       -UNSTABLE ANGINA       -CONGESTIVE HEART FAILURE       -MYOCARDITIS       -CHEST TRAUMA       -ARRYHTHMIAS       -LATE PRESENTING MYOCARDIAL INFARCTION       -COPD   CLINICAL FOLLOW-UP RECOMMENDED.   Glucose, capillary     Status: Abnormal   Collection Time: 03/05/15  1:33 AM  Result Value Ref Range   Glucose-Capillary 166 (H) 65 - 99 mg/dL   No results found.  Review of Systems  Constitutional: Negative for fever and chills.  HENT: Negative for sore throat and tinnitus.   Eyes: Negative for blurred vision and redness.  Respiratory: Negative for cough and shortness of breath.   Cardiovascular: Negative for chest pain, palpitations, orthopnea and PND.  Gastrointestinal: Negative for nausea, vomiting, abdominal pain and diarrhea.  Genitourinary: Negative for dysuria, urgency and frequency.  Musculoskeletal: Negative for myalgias and joint pain.       Leg pain bilaterally  Skin: Negative for rash.       No lesions  Neurological: Positive for weakness. Negative for speech change and focal weakness.  Endo/Heme/Allergies: Does not bruise/bleed easily.       No temperature intolerance  Psychiatric/Behavioral: Negative for depression and suicidal ideas.    Blood pressure 105/36, pulse 58, temperature 98.3 F (36.8 C), temperature source Oral, resp. rate 16, height 5' (1.524 m), weight 89.54 kg (197 lb 6.4 oz), SpO2 100 %. Physical Exam  Vitals reviewed. Constitutional: She is oriented to person, place, and time. She appears well-developed and well-nourished.  HENT:  Head: Normocephalic and atraumatic.  Eyes: EOM are normal. Pupils are equal, round, and reactive to light.  Neck: Normal range of motion. No tracheal deviation present. No thyromegaly present.  Cardiovascular: Normal rate, regular rhythm and normal heart sounds.  Exam reveals no gallop and no friction rub.   No murmur  heard. Respiratory: Effort normal and breath sounds normal.  GI: Soft. Bowel sounds are normal. She exhibits no distension. There is no tenderness.  Lymphadenopathy:    She has no cervical adenopathy.  Neurological: She is alert and oriented to person, place, and time. No cranial nerve deficit. She exhibits normal muscle tone.  Skin: Skin is warm and dry.  Psychiatric: She has a normal mood and affect. Judgment and thought content normal.     Assessment/Plan This is a 75 year old Caucasian female admitted for generalized weakness. 1. Generalized weakness: Likely multifactorial. The patient has chronic pain that she states is worse today in her legs than usual. She may  be slightly dehydrated as well as she is just come from dialysis or expressing some cramping associated with her dialysis treatment. We will gently rehydrate the patient and encourage by mouth intake. She has a mild leukocytosis which may represent viral infection that has suppressed her appetite and cause general malaise. We will continue to monitor her clinical condition and evaluate her strength and outpatient needs with physical and occupational therapy. 2. ESRD: On hemodialysis Monday Wednesday Friday. Nephrology consulted for continuity of treatment. Continue PhosLo for renal osteodystrophy 3. Hypothyroidism: Continue Synthroid 4. Coronary artery disease: Stable. Continue aspirin, Plavix and Imdur. Troponins were mildly abnormal but stable. No need to continue trend as the patient has no chest pain nor EKG changes.  5. Diabetes mellitus type 2: Continue basal insulin reduced for decreased appetite. Sliding scale insulin while hospitalized as well. 6. Congestive heart failure: Systolic, stable. The patient is clinically not fluid overloaded. Ejection fraction November 2016 40%. 7. Restless leg syndrome: Continue Requip 8. Depression: Continue Effexor 9. DVT prophylaxis: Heparin 10. GI prophylaxis: Pantoprazole per home  regimen The patient is a full code. Time spent on admission was inpatient care approximately 45 minutes   Harrie Foreman, MD 03/05/2015, 6:33 AM

## 2015-03-05 NOTE — Progress Notes (Signed)
Patient moaning and complaining of leg crump and is requesting for Morphine IV. BP=85/43. The nurse informed her that the Morphine is not safe for her due to the low BP.  Dr. Anne Hahn notified with a new order for Flexeril 5 mg x 1 dose. No new order received for the low BP. Will continue to monitor.

## 2015-03-05 NOTE — ED Notes (Signed)
Patient observed resting in bed with NAD noted. Patient pending admission at this time. VSS; A.fib on the monitor.

## 2015-03-05 NOTE — Progress Notes (Signed)
CBG 134 per Boneta Lucks NT. Glucometer not synching at this time.

## 2015-03-06 ENCOUNTER — Encounter: Payer: Self-pay | Admitting: Emergency Medicine

## 2015-03-06 ENCOUNTER — Emergency Department: Payer: Medicare (Managed Care)

## 2015-03-06 ENCOUNTER — Observation Stay
Admission: EM | Admit: 2015-03-06 | Discharge: 2015-03-08 | Disposition: A | Discharge status: Home / Self Care | Payer: Medicare (Managed Care) | Attending: Internal Medicine | Admitting: Internal Medicine

## 2015-03-06 DIAGNOSIS — I493 Ventricular premature depolarization: Secondary | ICD-10-CM | POA: Insufficient documentation

## 2015-03-06 DIAGNOSIS — Z79899 Other long term (current) drug therapy: Secondary | ICD-10-CM | POA: Insufficient documentation

## 2015-03-06 DIAGNOSIS — F419 Anxiety disorder, unspecified: Secondary | ICD-10-CM | POA: Diagnosis present

## 2015-03-06 DIAGNOSIS — Z7902 Long term (current) use of antithrombotics/antiplatelets: Secondary | ICD-10-CM | POA: Insufficient documentation

## 2015-03-06 DIAGNOSIS — I132 Hypertensive heart and chronic kidney disease with heart failure and with stage 5 chronic kidney disease, or end stage renal disease: Secondary | ICD-10-CM | POA: Insufficient documentation

## 2015-03-06 DIAGNOSIS — K769 Liver disease, unspecified: Secondary | ICD-10-CM | POA: Insufficient documentation

## 2015-03-06 DIAGNOSIS — Z794 Long term (current) use of insulin: Secondary | ICD-10-CM | POA: Diagnosis not present

## 2015-03-06 DIAGNOSIS — R008 Other abnormalities of heart beat: Secondary | ICD-10-CM | POA: Insufficient documentation

## 2015-03-06 DIAGNOSIS — I4891 Unspecified atrial fibrillation: Secondary | ICD-10-CM | POA: Insufficient documentation

## 2015-03-06 DIAGNOSIS — N186 End stage renal disease: Secondary | ICD-10-CM | POA: Insufficient documentation

## 2015-03-06 DIAGNOSIS — I498 Other specified cardiac arrhythmias: Secondary | ICD-10-CM

## 2015-03-06 DIAGNOSIS — Z809 Family history of malignant neoplasm, unspecified: Secondary | ICD-10-CM | POA: Insufficient documentation

## 2015-03-06 DIAGNOSIS — R531 Weakness: Secondary | ICD-10-CM | POA: Insufficient documentation

## 2015-03-06 DIAGNOSIS — K219 Gastro-esophageal reflux disease without esophagitis: Secondary | ICD-10-CM | POA: Diagnosis not present

## 2015-03-06 DIAGNOSIS — M1990 Unspecified osteoarthritis, unspecified site: Secondary | ICD-10-CM | POA: Diagnosis not present

## 2015-03-06 DIAGNOSIS — I252 Old myocardial infarction: Secondary | ICD-10-CM | POA: Insufficient documentation

## 2015-03-06 DIAGNOSIS — R918 Other nonspecific abnormal finding of lung field: Secondary | ICD-10-CM | POA: Insufficient documentation

## 2015-03-06 DIAGNOSIS — Z833 Family history of diabetes mellitus: Secondary | ICD-10-CM | POA: Diagnosis not present

## 2015-03-06 DIAGNOSIS — I499 Cardiac arrhythmia, unspecified: Secondary | ICD-10-CM

## 2015-03-06 DIAGNOSIS — R001 Bradycardia, unspecified: Principal | ICD-10-CM | POA: Insufficient documentation

## 2015-03-06 DIAGNOSIS — D631 Anemia in chronic kidney disease: Secondary | ICD-10-CM | POA: Diagnosis not present

## 2015-03-06 DIAGNOSIS — Z992 Dependence on renal dialysis: Secondary | ICD-10-CM | POA: Insufficient documentation

## 2015-03-06 DIAGNOSIS — I6529 Occlusion and stenosis of unspecified carotid artery: Secondary | ICD-10-CM | POA: Diagnosis not present

## 2015-03-06 DIAGNOSIS — R0602 Shortness of breath: Secondary | ICD-10-CM | POA: Insufficient documentation

## 2015-03-06 DIAGNOSIS — F329 Major depressive disorder, single episode, unspecified: Secondary | ICD-10-CM | POA: Insufficient documentation

## 2015-03-06 DIAGNOSIS — E039 Hypothyroidism, unspecified: Secondary | ICD-10-CM | POA: Diagnosis not present

## 2015-03-06 DIAGNOSIS — Z882 Allergy status to sulfonamides status: Secondary | ICD-10-CM | POA: Diagnosis not present

## 2015-03-06 DIAGNOSIS — I959 Hypotension, unspecified: Secondary | ICD-10-CM | POA: Diagnosis not present

## 2015-03-06 DIAGNOSIS — R42 Dizziness and giddiness: Secondary | ICD-10-CM | POA: Insufficient documentation

## 2015-03-06 DIAGNOSIS — E1122 Type 2 diabetes mellitus with diabetic chronic kidney disease: Secondary | ICD-10-CM | POA: Insufficient documentation

## 2015-03-06 DIAGNOSIS — E119 Type 2 diabetes mellitus without complications: Secondary | ICD-10-CM

## 2015-03-06 DIAGNOSIS — I5023 Acute on chronic systolic (congestive) heart failure: Secondary | ICD-10-CM | POA: Diagnosis present

## 2015-03-06 DIAGNOSIS — Z7951 Long term (current) use of inhaled steroids: Secondary | ICD-10-CM | POA: Diagnosis not present

## 2015-03-06 DIAGNOSIS — I2510 Atherosclerotic heart disease of native coronary artery without angina pectoris: Secondary | ICD-10-CM | POA: Insufficient documentation

## 2015-03-06 DIAGNOSIS — Z7982 Long term (current) use of aspirin: Secondary | ICD-10-CM | POA: Insufficient documentation

## 2015-03-06 DIAGNOSIS — Z8249 Family history of ischemic heart disease and other diseases of the circulatory system: Secondary | ICD-10-CM | POA: Diagnosis not present

## 2015-03-06 LAB — CBC WITH DIFFERENTIAL/PLATELET
Basophils Absolute: 0.1 10*3/uL (ref 0–0.1)
Basophils Relative: 1 %
EOS PCT: 0 %
Eosinophils Absolute: 0 10*3/uL (ref 0–0.7)
HEMATOCRIT: 24.8 % — AB (ref 35.0–47.0)
Hemoglobin: 7.6 g/dL — ABNORMAL LOW (ref 12.0–16.0)
LYMPHS ABS: 1.1 10*3/uL (ref 1.0–3.6)
LYMPHS PCT: 12 %
MCH: 24.4 pg — AB (ref 26.0–34.0)
MCHC: 30.5 g/dL — AB (ref 32.0–36.0)
MCV: 80 fL (ref 80.0–100.0)
MONO ABS: 1.4 10*3/uL — AB (ref 0.2–0.9)
MONOS PCT: 14 %
NEUTROS ABS: 7.2 10*3/uL — AB (ref 1.4–6.5)
Neutrophils Relative %: 73 %
PLATELETS: 146 10*3/uL — AB (ref 150–440)
RBC: 3.1 MIL/uL — ABNORMAL LOW (ref 3.80–5.20)
RDW: 22.1 % — AB (ref 11.5–14.5)
WBC: 9.9 10*3/uL (ref 3.6–11.0)

## 2015-03-06 LAB — RENAL FUNCTION PANEL
ANION GAP: 10 (ref 5–15)
Albumin: 3.2 g/dL — ABNORMAL LOW (ref 3.5–5.0)
BUN: 52 mg/dL — ABNORMAL HIGH (ref 6–20)
CALCIUM: 8.2 mg/dL — AB (ref 8.9–10.3)
CHLORIDE: 97 mmol/L — AB (ref 101–111)
CO2: 27 mmol/L (ref 22–32)
Creatinine, Ser: 5.48 mg/dL — ABNORMAL HIGH (ref 0.44–1.00)
GFR calc non Af Amer: 7 mL/min — ABNORMAL LOW (ref >60)
GFR, EST AFRICAN AMERICAN: 8 mL/min — AB (ref >60)
GLUCOSE: 108 mg/dL — AB (ref 65–99)
POTASSIUM: 5.2 mmol/L — AB (ref 3.5–5.1)
Phosphorus: 8.1 mg/dL — ABNORMAL HIGH (ref 2.5–4.6)
Sodium: 134 mmol/L — ABNORMAL LOW (ref 135–145)

## 2015-03-06 LAB — BASIC METABOLIC PANEL
Anion gap: 11 (ref 5–15)
BUN: 25 mg/dL — AB (ref 6–20)
CALCIUM: 8.3 mg/dL — AB (ref 8.9–10.3)
CO2: 29 mmol/L (ref 22–32)
Chloride: 97 mmol/L — ABNORMAL LOW (ref 101–111)
Creatinine, Ser: 3.26 mg/dL — ABNORMAL HIGH (ref 0.44–1.00)
GFR calc Af Amer: 15 mL/min — ABNORMAL LOW (ref >60)
GFR, EST NON AFRICAN AMERICAN: 13 mL/min — AB (ref >60)
GLUCOSE: 179 mg/dL — AB (ref 65–99)
Potassium: 4.1 mmol/L (ref 3.5–5.1)
Sodium: 137 mmol/L (ref 135–145)

## 2015-03-06 LAB — CBC
HEMATOCRIT: 26.3 % — AB (ref 35.0–47.0)
HEMOGLOBIN: 8.1 g/dL — AB (ref 12.0–16.0)
MCH: 24.4 pg — AB (ref 26.0–34.0)
MCHC: 31 g/dL — AB (ref 32.0–36.0)
MCV: 78.9 fL — AB (ref 80.0–100.0)
Platelets: 140 10*3/uL — ABNORMAL LOW (ref 150–440)
RBC: 3.33 MIL/uL — ABNORMAL LOW (ref 3.80–5.20)
RDW: 22.3 % — ABNORMAL HIGH (ref 11.5–14.5)
WBC: 10.3 10*3/uL (ref 3.6–11.0)

## 2015-03-06 LAB — GLUCOSE, CAPILLARY
Glucose-Capillary: 107 mg/dL — ABNORMAL HIGH (ref 65–99)
Glucose-Capillary: 125 mg/dL — ABNORMAL HIGH (ref 65–99)

## 2015-03-06 MED ORDER — ONDANSETRON HCL 4 MG/2ML IJ SOLN
4.0000 mg | Freq: Four times a day (QID) | INTRAMUSCULAR | Status: DC | PRN
  Administered 2015-03-06 – 2015-03-07 (×2): 4 mg via INTRAVENOUS
  Filled 2015-03-06 (×2): qty 2

## 2015-03-06 MED ORDER — EPOETIN ALFA 10000 UNIT/ML IJ SOLN
10000.0000 [IU] | Freq: Once | INTRAMUSCULAR | Status: AC
  Administered 2015-03-06: 10000 [IU] via INTRAVENOUS

## 2015-03-06 MED ORDER — SODIUM CHLORIDE 0.9 % IV BOLUS (SEPSIS)
250.0000 mL | Freq: Once | INTRAVENOUS | Status: AC
  Administered 2015-03-06: 250 mL via INTRAVENOUS

## 2015-03-06 MED ORDER — ONDANSETRON HCL 4 MG PO TABS
4.0000 mg | ORAL_TABLET | Freq: Four times a day (QID) | ORAL | Status: DC | PRN
  Administered 2015-03-07 – 2015-03-08 (×2): 4 mg via ORAL
  Filled 2015-03-06 (×2): qty 1

## 2015-03-06 MED ORDER — MIDODRINE HCL 5 MG PO TABS: 5.0000 mg | ORAL_TABLET | Freq: Three times a day (TID) | ORAL | Status: AC

## 2015-03-06 NOTE — Progress Notes (Signed)
Pre-hd tx 

## 2015-03-06 NOTE — H&P (Addendum)
[facility] - [place] at [facility]   PATIENT NAME: Savannah Key    MR#:  [id number]  DATE OF BIRTH:  Mar 20, 1940  DATE OF ADMISSION:  03/06/2015  PRIMARY CARE PHYSICIAN: Bobbye Morton, MD   REQUESTING/REFERRING PHYSICIAN: Inocencio Homes, MD  CHIEF COMPLAINT:   Chief Complaint  Patient presents with  . Hypotension  . Bradycardia    HISTORY OF PRESENT ILLNESS:  Savannah Key  is a 75 y.o. female who presents with shortness of breath and bradycardia. Patient was on her way out of the hospital after discharge today, being loaded into the ambulance to be taken to rehabilitation facility, when she became short of breath and EMT noted her to be bradycardic. Artery was in the 30s. They brought her back in the ED for evaluation. Workup here showed low blood pressure, but which is about at baseline. However, her EKG showed persistent bigeminy. Patient had a heart attack several months ago, and on chart review has had some amount of PVCs on her EKG since that time. However, today she is in persistent bigeminy, which is new for her. Hospitalists were called for evaluation and admission.  PAST MEDICAL HISTORY:   Past Medical History  Diagnosis Date  . Chronic kidney disease   . GERD (gastroesophageal reflux disease)   . Carotid artery stenosis   . Coronary artery disease   . Hypothyroidism   . Diabetes mellitus without complication (HCC)   . Depression   . Arthritis   . Kidney failure   . Liver failure (HCC)   . MI (myocardial infarction) (HCC)   . HTN (hypertension)   . Broken leg     R  . CHF (congestive heart failure) (HCC)     PAST SURGICAL HISTORY:   Past Surgical History  Procedure Laterality Date  . Av fistula placement Left   . Cholecystectomy    . Peripheral vascular catheterization N/A 06/11/2014    Procedure: A/V Shuntogram/Fistulagram;  Surgeon: Annice Needy, MD;  Location: [facility] INVASIVE CV LAB;  Service: Cardiovascular;  Laterality: N/A;  . Peripheral  vascular catheterization Left 06/11/2014    Procedure: A/V Shunt Intervention;  Surgeon: Annice Needy, MD;  Location: [facility] INVASIVE CV LAB;  Service: Cardiovascular;  Laterality: Left;  . Open reduction internal fixation (orif) distal radial fracture Left 08/09/2014    Procedure: OPEN REDUCTION INTERNAL FIXATION (ORIF) DISTAL RADIAL FRACTURE;  Surgeon: Kennedy Bucker, MD;  Location: [facility] ORS;  Service: Orthopedics;  Laterality: Left;  . Peripheral vascular catheterization Left 11/06/2014    Procedure: A/V Shuntogram/Fistulagram;  Surgeon: Renford Dills, MD;  Location: [facility] INVASIVE CV LAB;  Service: Cardiovascular;  Laterality: Left;  . Peripheral vascular catheterization Left 11/06/2014    Procedure: A/V Shunt Intervention;  Surgeon: Renford Dills, MD;  Location: [facility] INVASIVE CV LAB;  Service: Cardiovascular;  Laterality: Left;  . Peripheral vascular catheterization Left 01/15/2015    Procedure: A/V Shuntogram/Fistulagram;  Surgeon: Renford Dills, MD;  Location: [facility] INVASIVE CV LAB;  Service: Cardiovascular;  Laterality: Left;  . Peripheral vascular catheterization N/A 01/15/2015    Procedure: A/V Shunt Intervention;  Surgeon: Renford Dills, MD;  Location: [facility] INVASIVE CV LAB;  Service: Cardiovascular;  Laterality: N/A;    SOCIAL HISTORY:   Social History  Substance Use Topics  . Smoking status: Never Smoker   . Smokeless tobacco: Never Used  . Alcohol Use: No    FAMILY HISTORY:   Family History  Problem Relation Age of Onset  .  Hypertension Father   . Heart attack Mother   . Diabetes Father   . Cancer Father     DRUG ALLERGIES:   Allergies  Allergen Reactions  . Sulfa Antibiotics Other (See Comments)    Reaction:  Blood in the urine     MEDICATIONS AT HOME:   Prior to Admission medications   Medication Sig Start Date End Date Taking? Authorizing Provider  albuterol (PROVENTIL HFA;VENTOLIN HFA) 108 (90 Base) MCG/ACT inhaler Inhale 2 puffs into the lungs  every 4 (four) hours as needed for shortness of breath.    Historical Provider, MD  alendronate (FOSAMAX) 70 MG tablet Take 70 mg by mouth once a week. Take with a full glass of water on an empty stomach. Patient takes on Monday morning    Historical Provider, MD  aspirin EC 81 MG tablet Take 81 mg by mouth daily.     Historical Provider, MD  benzonatate (TESSALON) 100 MG capsule Take 100 mg by mouth 3 (three) times daily as needed for cough.    Historical Provider, MD  budesonide (PULMICORT) 0.25 MG/2ML nebulizer solution Take 2 mLs (0.25 mg total) by nebulization 2 (two) times daily. 12/19/14   Alford Highland, MD  calcium acetate (PHOSLO) 667 MG capsule Take 667 mg by mouth daily.     Historical Provider, MD  Carboxymethylcellul-Glycerin 0.5-0.9 % SOLN Place 1 drop into both eyes 3 (three) times daily.    Historical Provider, MD  cetirizine (ZYRTEC) 5 MG tablet Take 5 mg by mouth at bedtime.     Historical Provider, MD  clopidogrel (PLAVIX) 75 MG tablet Take 1 tablet (75 mg total) by mouth daily. 10/04/14   Srikar Sudini, MD  fluticasone (FLONASE) 50 MCG/ACT nasal spray Place 2 sprays into both nostrils daily.    Historical Provider, MD  Fluticasone Furoate-Vilanterol (BREO ELLIPTA) 100-25 MCG/INH AEPB Inhale 1 puff into the lungs daily. 01/10/15 01/11/16  Erin Fulling, MD  glucose 4 GM chewable tablet Chew 1 tablet by mouth as needed for low blood sugar.    Historical Provider, MD  insulin aspart (NOVOLOG FLEXPEN) 100 UNIT/ML FlexPen Inject 12 Units into the skin 3 (three) times daily with meals.    Historical Provider, MD  insulin glargine (LANTUS) 100 UNIT/ML injection Inject 0.45 mLs (45 Units total) into the skin 2 (two) times daily. Patient taking differently: Inject 42 Units into the skin 2 (two) times daily.  12/19/14   Alford Highland, MD  ipratropium-albuterol (DUONEB) 0.5-2.5 (3) MG/3ML SOLN Take 3 mLs by nebulization every 4 (four) hours as needed. 12/19/14   Alford Highland, MD   isosorbide mononitrate (IMDUR) 60 MG 24 hr tablet Take 60 mg by mouth daily.     Historical Provider, MD  levothyroxine (SYNTHROID, LEVOTHROID) 75 MCG tablet Take 75 mcg by mouth daily.    Historical Provider, MD  lidocaine (LIDODERM) 5 % Place 1 patch onto the skin daily. Remove & Discard patch within 12 hours or as directed by MD    Historical Provider, MD  loperamide (IMODIUM) 2 MG capsule Take 2-4 mg by mouth as needed for diarrhea or loose stools.     Historical Provider, MD  midodrine (PROAMATINE) 5 MG tablet Take 1 tablet (5 mg total) by mouth 3 (three) times daily with meals. 03/06/15   Houston Siren, MD  mineral oil-hydrophilic petrolatum (AQUAPHOR) ointment Apply 1 application topically as needed for dry skin.    Historical Provider, MD  nitroGLYCERIN (NITROSTAT) 0.4 MG SL tablet Place 0.4 mg under  the tongue every 5 (five) minutes as needed for chest pain.    Historical Provider, MD  Nutritional Supplements (FEEDING SUPPLEMENT, NEPRO CARB STEADY,) LIQD Take 237 mLs by mouth daily.    Historical Provider, MD  pantoprazole (PROTONIX) 40 MG tablet Take 40 mg by mouth 2 (two) times daily.    Historical Provider, MD  pregabalin (LYRICA) 75 MG capsule Take 75 mg by mouth at bedtime.    Historical Provider, MD  psyllium (METAMUCIL SMOOTH TEXTURE) 28 % packet Take 1 packet by mouth daily.    Historical Provider, MD  ranitidine (ZANTAC) 150 MG tablet Take 150 mg by mouth at bedtime.    Historical Provider, MD  rOPINIRole (REQUIP) 2 MG tablet Take 2 mg by mouth at bedtime.    Historical Provider, MD  traZODone (DESYREL) 50 MG tablet Take 50 mg by mouth at bedtime.    Historical Provider, MD  triamcinolone (KENALOG) 0.1 % paste Use as directed 1 application in the mouth or throat at bedtime as needed (gum irritation).    Historical Provider, MD  trolamine salicylate (ASPERCREME) 10 % cream Apply 1 application topically 4 (four) times daily.    Historical Provider, MD  Vaginal Lubricant (REPLENS)  GEL Place 1 application vaginally as needed (vaginal dryness).    Historical Provider, MD  venlafaxine XR (EFFEXOR-XR) 37.5 MG 24 hr capsule Take 37.5 mg by mouth at bedtime.    Historical Provider, MD    REVIEW OF SYSTEMS:  Review of Systems  Constitutional: Negative for fever, chills, weight loss and malaise/fatigue.  HENT: Negative for ear pain, hearing loss and tinnitus.   Eyes: Negative for blurred vision, double vision, pain and redness.  Respiratory: Positive for shortness of breath. Negative for cough and hemoptysis.   Cardiovascular: Negative for chest pain, palpitations, orthopnea and leg swelling.  Gastrointestinal: Negative for nausea, vomiting, abdominal pain, diarrhea and constipation.  Genitourinary: Negative for dysuria, frequency and hematuria.  Musculoskeletal: Negative for back pain, joint pain and neck pain.  Skin:       No acne, rash, or lesions  Neurological: Positive for weakness. Negative for dizziness, tremors and focal weakness.  Endo/Heme/Allergies: Negative for polydipsia. Does not bruise/bleed easily.  Psychiatric/Behavioral: Negative for depression. The patient is not nervous/anxious and does not have insomnia.      VITAL SIGNS:   Filed Vitals:   03/06/15 1830 03/06/15 1900 03/06/15 2011 03/06/15 2030  BP: 92/49  Pulse: 35 43 76 68  Temp:      TempSrc:      Resp: SpO2: 95% 97% 97% 94%   Wt Readings from Last 3 Encounters:  03/06/15 90 kg (198 lb 6.6 oz)  01/15/15 88.905 kg (196 lb)  01/10/15 89.086 kg (196 lb 6.4 oz)    PHYSICAL EXAMINATION:  Physical Exam  Vitals reviewed. Constitutional: She is oriented to person, place, and time. She appears well-developed and well-nourished. No distress.  HENT:  Head: Normocephalic and atraumatic.  Mouth/Throat: Oropharynx is clear and moist.  Eyes: Conjunctivae and EOM are normal. Pupils are equal, round, and reactive to light. No scleral icterus.  Neck: Normal range of  motion. Neck supple. No JVD present. No thyromegaly present.  Cardiovascular: Normal rate and intact distal pulses.  Exam reveals no gallop and no friction rub.   No murmur heard. Bigeminy on monitor, auscultation correlates to frequent PVCs/bigeminy  Respiratory: Breath sounds normal. She is in respiratory distress (mildly increased work of breathing with shallow breaths.).  She has no wheezes. She has no rales.  GI: Soft. Bowel sounds are normal. She exhibits no distension. There is no tenderness.  Musculoskeletal: Normal range of motion. She exhibits no edema.  No arthritis, no gout  Lymphadenopathy:    She has no cervical adenopathy.  Neurological: She is alert and oriented to person, place, and time. No cranial nerve deficit.  No dysarthria, no aphasia  Skin: Skin is warm and dry. No rash noted. No erythema.  Psychiatric: She has a normal mood and affect. Her behavior is normal. Judgment and thought content normal.    LABORATORY PANEL:   CBC  Recent Labs Lab 03/06/15 1808  WBC 9.9  HGB 7.6*  HCT 24.8*  PLT 146*   ------------------------------------------------------------------------------------------------------------------  Chemistries   Recent Labs Lab 03/04/15 1902  03/06/15 1808  NA 136  < > 137  K 4.0  < > 4.1  CL 97*  < > 97*  CO2 26  < > 29  GLUCOSE 171*  < > 179*  BUN 23*  < > 25*  CREATININE 3.23*  < > 3.26*  CALCIUM 8.4*  < > 8.3*  MG 2.3  --   --   AST 51*  --   --   ALT 28  --   --   ALKPHOS 113  --   --   BILITOT 0.7  --   --   < > = values in this interval not displayed. ------------------------------------------------------------------------------------------------------------------  Cardiac Enzymes  Recent Labs Lab 03/04/15 2100  TROPONINI 0.15*   ------------------------------------------------------------------------------------------------------------------  RADIOLOGY:  Dg Chest Portable 1 View  03/06/2015  CLINICAL DATA:   Labored breathing. EXAM: PORTABLE CHEST 1 VIEW COMPARISON:  12/11/2014.  Chest CT from 12/11/2014. FINDINGS: 1923 hours. The cardio pericardial silhouette is enlarged. No pulmonary edema or focal airspace consolidation. No evidence for pleural effusion. Scattered nodular opacity seen on the previous chest CT are not evident on today's x-ray. The visualized bony structures of the thorax are intact. IMPRESSION: Stable compared to prior chest x-ray. Cardiomegaly without overt pulmonary edema or focal airspace consolidation. Electronically Signed   By: Kennith Center M.D.   On: 03/06/2015 19:39    EKG:   Orders placed or performed during the hospital encounter of 03/06/15  . EKG 12-Lead  . EKG 12-Lead  . EKG 12-Lead  . EKG 12-Lead    IMPRESSION AND PLAN:  Principal Problem:   Bradycardia - we'll hold any medications that might affect the patient's heart rate. We'll monitor with telemetry tonight, get a cardiology consult for the morning. Active Problems:   Chronic systolic (congestive) heart failure (HCC) - does not seem to be in acute exacerbation, continue home meds   CAD (coronary artery disease) - continue home meds   Anxiety - home dose anxiolytics when necessary   Type 2 diabetes mellitus (HCC) - sliding scale insulin with corresponding glucose checks are modified diet   ESRD on dialysis [facility]) - patient got dialysis today prior to discharge, nephrology consult for dialysis support   GERD (gastroesophageal reflux disease) - home dose PPI   Hypothyroidism - home dose thyroid replacement  All the records are reviewed and case discussed with ED provider. Management plans discussed with the patient and/or family.  DVT PROPHYLAXIS: SubQ heparin  GI PROPHYLAXIS: PPI  ADMISSION STATUS: Observation  CODE STATUS: DNR Code Status History    Date Active Date Inactive Code Status Order ID Comments User Context   03/05/2015  1:21 AM 03/06/2015  8:24 PM  Full Code [id number]  Arnaldo Natal, MD  ED   01/15/2015  1:22 PM 01/15/2015  5:50 PM Full Code [id number]  Renford Dills, MD Inpatient   12/07/2014 10:12 PM 12/19/2014  6:16 PM Full Code [id number]  Wyatt Haste, MD ED   10/01/2014  8:51 AM 10/05/2014  6:57 PM Full Code [id number]  Arnaldo Natal, MD Inpatient   08/09/2014 11:21 AM 08/09/2014  5:05 PM Full Code [id number]  Kennedy Bucker, MD Inpatient      TOTAL TIME TAKING CARE OF THIS PATIENT: 45 minutes.    Tymel Conely FIELDING 03/06/2015, 9:38 PM  [facility]  Office  [phone]  CC: Primary care physician; Bobbye Morton, MD

## 2015-03-06 NOTE — Progress Notes (Signed)
Hemodialysis start 

## 2015-03-06 NOTE — Progress Notes (Signed)
Post hd tx 

## 2015-03-06 NOTE — Progress Notes (Signed)
PT Cancellation Note  Patient Details Name: GENIE WENKE MRN: [id number] DOB: [birth year]   Cancelled Treatment:    Reason Eval/Treat Not Completed: Patient at procedure or test/unavailable. Pt currently at a hemodialysis treatment. Will f/u to resume PT at a later time.   Adelene Idler, PT, DPT  03/06/2015, 11:18 AM [phone]

## 2015-03-06 NOTE — ED Provider Notes (Signed)
[facility] Emergency Department Provider Note  ____________________________________________  Time seen: Approximately 6:06 PM  I have reviewed the triage vital signs and the nursing notes.   HISTORY  Chief Complaint Hypotension and Bradycardia    HPI Savannah Key is a 75 y.o. female with end-stage renal disease on hemodialysis, osteoarthritis, GERD, carotid artery disease, hypothyroidism, diabetes, depression, history of previous MI, history of CHF who presents for evaluation of bradycardia and hypotension as well as shortness of breath, gradual onset today, constant since onset, no modifying factors, currently mild to moderate. The patient was being discharged from the inpatient service just prior to arrival. She was on the EMS stretcher and when she reached the ambulance bay, she complained of shortness of breath at which time she was noted to have bradycardia with a heart rate in the 30s as well as hypotension. She was brought back into the hospital, this time to the emergency department for evaluation. She denies any chest pain. No vomiting, diarrhea, fevers or chills. She was just evaluated here for generalized weakness with no specific acute cause, she underwent dialysis this morning.   Past Medical History  Diagnosis Date  . Chronic kidney disease   . GERD (gastroesophageal reflux disease)   . Carotid artery stenosis   . Coronary artery disease   . Hypothyroidism   . Diabetes mellitus without complication (HCC)   . Depression   . Arthritis   . Kidney failure   . Liver failure (HCC)   . MI (myocardial infarction) (HCC)   . HTN (hypertension)   . Broken leg     R  . CHF (congestive heart failure) [facility])     Patient Active Problem List   Diagnosis Date Noted  . Bradycardia 03/06/2015  . GERD (gastroesophageal reflux disease) 03/06/2015  . CAD (coronary artery disease) 03/06/2015  . Hypothyroidism 03/06/2015  . Anxiety 03/06/2015  . Type 2  diabetes mellitus (HCC) 03/06/2015  . ESRD on dialysis (HCC) 03/06/2015  . Generalized weakness 03/04/2015  . Acute on chronic systolic (congestive) heart failure (HCC) 12/07/2014    Past Surgical History  Procedure Laterality Date  . Av fistula placement Left   . Cholecystectomy    . Peripheral vascular catheterization N/A 06/11/2014    Procedure: A/V Shuntogram/Fistulagram;  Surgeon: Annice Needy, MD;  Location: [facility] INVASIVE CV LAB;  Service: Cardiovascular;  Laterality: N/A;  . Peripheral vascular catheterization Left 06/11/2014    Procedure: A/V Shunt Intervention;  Surgeon: Annice Needy, MD;  Location: [facility] INVASIVE CV LAB;  Service: Cardiovascular;  Laterality: Left;  . Open reduction internal fixation (orif) distal radial fracture Left 08/09/2014    Procedure: OPEN REDUCTION INTERNAL FIXATION (ORIF) DISTAL RADIAL FRACTURE;  Surgeon: Kennedy Bucker, MD;  Location: [facility] ORS;  Service: Orthopedics;  Laterality: Left;  . Peripheral vascular catheterization Left 11/06/2014    Procedure: A/V Shuntogram/Fistulagram;  Surgeon: Renford Dills, MD;  Location: [facility] INVASIVE CV LAB;  Service: Cardiovascular;  Laterality: Left;  . Peripheral vascular catheterization Left 11/06/2014    Procedure: A/V Shunt Intervention;  Surgeon: Renford Dills, MD;  Location: [facility] INVASIVE CV LAB;  Service: Cardiovascular;  Laterality: Left;  . Peripheral vascular catheterization Left 01/15/2015    Procedure: A/V Shuntogram/Fistulagram;  Surgeon: Renford Dills, MD;  Location: [facility] INVASIVE CV LAB;  Service: Cardiovascular;  Laterality: Left;  . Peripheral vascular catheterization N/A 01/15/2015    Procedure: A/V Shunt Intervention;  Surgeon: Renford Dills, MD;  Location: [facility] INVASIVE CV LAB;  Service: Cardiovascular;  Laterality: N/A;    Current Outpatient Rx  Name  Route  Sig  Dispense  Refill  . albuterol (PROVENTIL HFA;VENTOLIN HFA) 108 (90 Base) MCG/ACT inhaler   Inhalation   Inhale 2 puffs into  the lungs every 4 (four) hours as needed for shortness of breath.         Marland Kitchen alendronate (FOSAMAX) 70 MG tablet   Oral   Take 70 mg by mouth once a week. Take with a full glass of water on an empty stomach. Patient takes on Monday morning         . aspirin EC 81 MG tablet   Oral   Take 81 mg by mouth daily.          . benzonatate (TESSALON) 100 MG capsule   Oral   Take 100 mg by mouth 3 (three) times daily as needed for cough.         . budesonide (PULMICORT) 0.25 MG/2ML nebulizer solution   Nebulization   Take 2 mLs (0.25 mg total) by nebulization 2 (two) times daily.   60 mL   12   . calcium acetate (PHOSLO) 667 MG capsule   Oral   Take 667 mg by mouth daily.          . Carboxymethylcellul-Glycerin 0.5-0.9 % SOLN   Both Eyes   Place 1 drop into both eyes 3 (three) times daily.         . cetirizine (ZYRTEC) 5 MG tablet   Oral   Take 5 mg by mouth at bedtime.          . clopidogrel (PLAVIX) 75 MG tablet   Oral   Take 1 tablet (75 mg total) by mouth daily.   30 tablet   0   . fluticasone (FLONASE) 50 MCG/ACT nasal spray   Each Nare   Place 2 sprays into both nostrils daily.         . Fluticasone Furoate-Vilanterol (BREO ELLIPTA) 100-25 MCG/INH AEPB   Inhalation   Inhale 1 puff into the lungs daily.   60 each   5   . glucose 4 GM chewable tablet   Oral   Chew 1 tablet by mouth as needed for low blood sugar.         . insulin aspart (NOVOLOG FLEXPEN) 100 UNIT/ML FlexPen   Subcutaneous   Inject 12 Units into the skin 3 (three) times daily with meals.         . insulin glargine (LANTUS) 100 UNIT/ML injection   Subcutaneous   Inject 0.45 mLs (45 Units total) into the skin 2 (two) times daily. Patient taking differently: Inject 42 Units into the skin 2 (two) times daily.    10 mL   11   . ipratropium-albuterol (DUONEB) 0.5-2.5 (3) MG/3ML SOLN   Nebulization   Take 3 mLs by nebulization every 4 (four) hours as needed.   360 mL   0   .  isosorbide mononitrate (IMDUR) 60 MG 24 hr tablet   Oral   Take 60 mg by mouth daily.          Marland Kitchen levothyroxine (SYNTHROID, LEVOTHROID) 75 MCG tablet   Oral   Take 75 mcg by mouth daily.         Marland Kitchen lidocaine (LIDODERM) 5 %   Transdermal   Place 1 patch onto the skin daily. Remove & Discard patch within 12 hours or as directed by MD         .  loperamide (IMODIUM) 2 MG capsule   Oral   Take 2-4 mg by mouth as needed for diarrhea or loose stools.          . midodrine (PROAMATINE) 5 MG tablet   Oral   Take 1 tablet (5 mg total) by mouth 3 (three) times daily with meals.   90 tablet   1   . mineral oil-hydrophilic petrolatum (AQUAPHOR) ointment   Topical   Apply 1 application topically as needed for dry skin.         Marland Kitchen nitroGLYCERIN (NITROSTAT) 0.4 MG SL tablet   Sublingual   Place 0.4 mg under the tongue every 5 (five) minutes as needed for chest pain.         . Nutritional Supplements (FEEDING SUPPLEMENT, NEPRO CARB STEADY,) LIQD   Oral   Take 237 mLs by mouth daily.         . pantoprazole (PROTONIX) 40 MG tablet   Oral   Take 40 mg by mouth 2 (two) times daily.         . pregabalin (LYRICA) 75 MG capsule   Oral   Take 75 mg by mouth at bedtime.         . psyllium (METAMUCIL SMOOTH TEXTURE) 28 % packet   Oral   Take 1 packet by mouth daily.         . ranitidine (ZANTAC) 150 MG tablet   Oral   Take 150 mg by mouth at bedtime.         Marland Kitchen rOPINIRole (REQUIP) 2 MG tablet   Oral   Take 2 mg by mouth at bedtime.         . traZODone (DESYREL) 50 MG tablet   Oral   Take 50 mg by mouth at bedtime.         . triamcinolone (KENALOG) 0.1 % paste   Mouth/Throat   Use as directed 1 application in the mouth or throat at bedtime as needed (gum irritation).         . trolamine salicylate (ASPERCREME) 10 % cream   Topical   Apply 1 application topically 4 (four) times daily.         . Vaginal Lubricant (REPLENS) GEL   Vaginal   Place 1  application vaginally as needed (vaginal dryness).         . venlafaxine XR (EFFEXOR-XR) 37.5 MG 24 hr capsule   Oral   Take 37.5 mg by mouth at bedtime.           Allergies Sulfa antibiotics  Family History  Problem Relation Age of Onset  . Hypertension Father   . Heart attack Mother   . Diabetes Father   . Cancer Father     Social History Social History  Substance Use Topics  . Smoking status: Never Smoker   . Smokeless tobacco: Never Used  . Alcohol Use: No    Review of Systems Constitutional: No fever/chills Eyes: No visual changes. ENT: No sore throat. Cardiovascular: Denies chest pain. Respiratory: +shortness of breath. Gastrointestinal: No abdominal pain.  No nausea, no vomiting.  No diarrhea.  No constipation. Genitourinary: Negative for dysuria. Musculoskeletal: Negative for back pain. Skin: Negative for rash. Neurological: Negative for headaches, focal weakness or numbness.  10-point ROS otherwise negative.  ____________________________________________   PHYSICAL EXAM:  Filed Vitals:   03/06/15 1830 03/06/15 1900 03/06/15 2011 03/06/15 2030  BP: 92/48 91/66 95/45  92/49  Pulse: 35 43 76 68  Temp:  TempSrc:      Resp: SpO2: 95% 97% 97% 94%     Constitutional: Alert and oriented. Chronically ill- appearing and in no acute distress. Eyes: Conjunctivae are normal. PERRL. EOMI. Head: Atraumatic. Nose: No congestion/rhinnorhea. Mouth/Throat: Mucous membranes are moist.  Oropharynx non-erythematous. Neck: No stridor.  Supple without meningismus. Cardiovascular: bradycardic rate, regular rhythm. Grossly normal heart sounds.  Good peripheral circulation. Respiratory: Normal respiratory effort.  No retractions. Diminished breath sounds in bilateral bases. Gastrointestinal: Soft and nontender. No distention. No CVA tenderness. Genitourinary: deferred Musculoskeletal: No lower extremity tenderness nor edema.  No joint  effusions. Neurologic:  Normal speech and language. No gross focal neurologic deficits are appreciated.  Skin:  Skin is warm, dry and intact. No rash noted. Psychiatric: Mood and affect are normal. Speech and behavior are normal.  ____________________________________________   LABS (all labs ordered are listed, but only abnormal results are displayed)  Labs Reviewed  CBC WITH DIFFERENTIAL/PLATELET - Abnormal; Notable for the following:    RBC 3.10 (*)    Hemoglobin 7.6 (*)    HCT 24.8 (*)    MCH 24.4 (*)    MCHC 30.5 (*)    RDW 22.1 (*)    Platelets 146 (*)    Neutro Abs 7.2 (*)    Monocytes Absolute 1.4 (*)    All other components within normal limits  BASIC METABOLIC PANEL - Abnormal; Notable for the following:    Chloride 97 (*)    Glucose, Bld 179 (*)    BUN 25 (*)    Creatinine, Ser 3.26 (*)    Calcium 8.3 (*)    GFR calc non Af Amer 13 (*)    GFR calc Af Amer 15 (*)    All other components within normal limits   ____________________________________________  EKG  ED ECG REPORT I, Gayla Doss, the attending physician, personally viewed and interpreted this ECG.   Date: 03/06/2015  EKG Time: 19:29  Rate: 99  Rhythm: normal sinus rhythm  Axis: normal  Intervals:none  ST&T Change: No acute ST elevation. Ventricular bigeminy. Q waves in V1, V2, V3, V4.  ____________________________________________  RADIOLOGY  CXR IMPRESSION: Stable compared to prior chest x-ray. Cardiomegaly without overt pulmonary edema or focal airspace consolidation.  ____________________________________________   PROCEDURES  Procedure(s) performed: None  Critical Care performed: No  ____________________________________________   INITIAL IMPRESSION / ASSESSMENT AND PLAN / ED COURSE  Pertinent labs & imaging results that were available during my care of the patient were reviewed by me and considered in my medical decision making (see chart for details).  DAZIAH HESLER is  a 75 y.o. female with end-stage renal disease on hemodialysis, osteoarthritis, GERD, carotid artery disease, hypothyroidism, diabetes, depression, history of previous MI, history of CHF who presents for evaluation of bradycardia and hypotension as well as shortness of breath. On exam, she is chronically ill-appearing and in no acute distress. Initial heart rate of 32 bpm however this improved to 60s to 70s without any intervention. Initial blood pressure 86/45, increased to 91/66 which appears to be near her baseline. EKG shows ventricular bigeminy which would account for her probable bradycardia that was noted by EMS. We'll obtain screening labs and chest x-ray. I discussed the case with Dr. Elpidio Anis, hospitalist for consultation with possible readmission. ____________________________________________   FINAL CLINICAL IMPRESSION(S) / ED DIAGNOSES  Final diagnoses:  SOB (shortness of breath)  Bradycardia  Ventricular bigeminy      Gayla Doss, MD 03/06/15  2110 

## 2015-03-06 NOTE — ED Notes (Signed)
Family at bedside. 

## 2015-03-06 NOTE — ED Notes (Signed)
Pt presents to ED via EMS from EMS parking bay for labored breathing, hypotension, and bradycardia after she was discharged from the hospital and about to go home.

## 2015-03-06 NOTE — Discharge Summary (Signed)
[facility] [place] at [facility]   PATIENT NAME: Savannah Key    MR#:  [id number]  DATE OF BIRTH:  1940/11/09  DATE OF ADMISSION:  03/04/2015 ADMITTING PHYSICIAN: Arnaldo Natal, MD  DATE OF DISCHARGE: 03/06/2015  PRIMARY CARE PHYSICIAN: Bobbye Morton, MD    ADMISSION DIAGNOSIS:  Generalized weakness [R53.1]  DISCHARGE DIAGNOSIS:  Active Problems:   Generalized weakness   SECONDARY DIAGNOSIS:   Past Medical History  Diagnosis Date  . Chronic kidney disease   . GERD (gastroesophageal reflux disease)   . Carotid artery stenosis   . Coronary artery disease   . Hypothyroidism   . Diabetes mellitus without complication (HCC)   . Depression   . Arthritis   . Kidney failure   . Liver failure (HCC)   . MI (myocardial infarction) (HCC)   . HTN (hypertension)   . Broken leg     R  . CHF (congestive heart failure) [facility])     HOSPITAL COURSE:   75 year old female with past medical history of end-stage renal disease on hemodialysis, osteoarthritis, GERD, carotid artery disease, hypothyroidism, diabetes, depression, history of previous MI, history of CHF who presented to the hospital due to generalized weakness and fatigue.  #1 generalized weakness/fatigue-No evidence of acute infectious source or any metabolic source.  -This was probably related to underlying debility, osteoarthritis, mild hypotension. She was started on Midodrine and her BP remains low but stable.  -She was seen by physical therapy and they recommended short-term rehabilitation but she is followed by the [facility] and we'll get physical therapy through them.  #2 end-stage renal disease on hemodialysis-nephrology was consulted and she received hemodialysis today prior to discharge.  -She will continue dialysis Monday Wednesday and Friday.  #3 diabetes type 2 without complication-she will continue Lantus, sliding scale insulin.  #4 hypothyroidism-she will continue  Synthroid.  #5 secondary hyperparathyroidism-she will continue PhosLo.  #6 COPD-no acute exacerbation while in the hospital. She will Continue Dulera, DuoNeb nebs, Pulmicort nebs.  #7 diabetic neuropathy- she will continue Lyrica.  #8 history of coronary artery disease-she will continue aspirin, Plavix, Imdur.  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  #10 depression-she will continue Effexor.  DISCHARGE CONDITIONS:   Stable.   CONSULTS OBTAINED:  Treatment Team:  Lamont Dowdy, MD  DRUG ALLERGIES:   Allergies  Allergen Reactions  . Sulfa Antibiotics Other (See Comments)    Reaction:  Blood in the urine     DISCHARGE MEDICATIONS:   Current Discharge Medication List    START taking these medications   Details  midodrine (PROAMATINE) 5 MG tablet Take 1 tablet (5 mg total) by mouth 3 (three) times daily with meals. Qty: 90 tablet, Refills: 1      CONTINUE these medications which have NOT CHANGED   Details  albuterol (PROVENTIL HFA;VENTOLIN HFA) 108 (90 Base) MCG/ACT inhaler Inhale 2 puffs into the lungs every 4 (four) hours as needed for shortness of breath.    alendronate (FOSAMAX) 70 MG tablet Take 70 mg by mouth once a week. Take with a full glass of water on an empty stomach. Patient takes on Monday morning    aspirin EC 81 MG tablet Take 81 mg by mouth daily.     benzonatate (TESSALON) 100 MG capsule Take 100 mg by mouth 3 (three) times daily as needed for cough.    budesonide (PULMICORT) 0.25 MG/2ML nebulizer solution Take 2 mLs (0.25 mg total) by nebulization 2 (two) times daily. Qty:  60 mL, Refills: 12    calcium acetate (PHOSLO) 667 MG capsule Take 667 mg by mouth daily.     Carboxymethylcellul-Glycerin 0.5-0.9 % SOLN Place 1 drop into both eyes 3 (three) times daily.    cetirizine (ZYRTEC) 5 MG tablet Take 5 mg by mouth at bedtime.     clopidogrel (PLAVIX) 75 MG tablet Take 1 tablet (75 mg total) by mouth daily. Qty: 30 tablet, Refills: 0     fluticasone (FLONASE) 50 MCG/ACT nasal spray Place 2 sprays into both nostrils daily.    Fluticasone Furoate-Vilanterol (BREO ELLIPTA) 100-25 MCG/INH AEPB Inhale 1 puff into the lungs daily. Qty: 60 each, Refills: 5    glucose 4 GM chewable tablet Chew 1 tablet by mouth as needed for low blood sugar.    insulin aspart (NOVOLOG FLEXPEN) 100 UNIT/ML FlexPen Inject 12 Units into the skin 3 (three) times daily with meals.    insulin glargine (LANTUS) 100 UNIT/ML injection Inject 0.45 mLs (45 Units total) into the skin 2 (two) times daily. Qty: 10 mL, Refills: 11    ipratropium-albuterol (DUONEB) 0.5-2.5 (3) MG/3ML SOLN Take 3 mLs by nebulization every 4 (four) hours as needed. Qty: 360 mL, Refills: 0    isosorbide mononitrate (IMDUR) 60 MG 24 hr tablet Take 60 mg by mouth daily.     levothyroxine (SYNTHROID, LEVOTHROID) 75 MCG tablet Take 75 mcg by mouth daily.    lidocaine (LIDODERM) 5 % Place 1 patch onto the skin daily. Remove & Discard patch within 12 hours or as directed by MD    loperamide (IMODIUM) 2 MG capsule Take 2-4 mg by mouth as needed for diarrhea or loose stools.     mineral oil-hydrophilic petrolatum (AQUAPHOR) ointment Apply 1 application topically as needed for dry skin.    nitroGLYCERIN (NITROSTAT) 0.4 MG SL tablet Place 0.4 mg under the tongue every 5 (five) minutes as needed for chest pain.    Nutritional Supplements (FEEDING SUPPLEMENT, NEPRO CARB STEADY,) LIQD Take 237 mLs by mouth daily.    pantoprazole (PROTONIX) 40 MG tablet Take 40 mg by mouth 2 (two) times daily.    pregabalin (LYRICA) 75 MG capsule Take 75 mg by mouth at bedtime.    psyllium (METAMUCIL SMOOTH TEXTURE) 28 % packet Take 1 packet by mouth daily.    ranitidine (ZANTAC) 150 MG tablet Take 150 mg by mouth at bedtime.    rOPINIRole (REQUIP) 2 MG tablet Take 2 mg by mouth at bedtime.    traZODone (DESYREL) 50 MG tablet Take 50 mg by mouth at bedtime.    triamcinolone (KENALOG) 0.1 %  paste Use as directed 1 application in the mouth or throat at bedtime as needed (gum irritation).    trolamine salicylate (ASPERCREME) 10 % cream Apply 1 application topically 4 (four) times daily.    Vaginal Lubricant (REPLENS) GEL Place 1 application vaginally as needed (vaginal dryness).    venlafaxine XR (EFFEXOR-XR) 37.5 MG 24 hr capsule Take 37.5 mg by mouth at bedtime.      STOP taking these medications     cephALEXin (KEFLEX) 250 MG capsule          DISCHARGE INSTRUCTIONS:   DIET:  Cardiac diet, Diabetic diet and Renal diet  DISCHARGE CONDITION:  Stable  ACTIVITY:  Activity as tolerated  OXYGEN:  Home Oxygen: No.   Oxygen Delivery: room air  DISCHARGE LOCATION:  home   If you experience worsening of your admission symptoms, develop shortness of breath, life threatening emergency, suicidal or  homicidal thoughts you must seek medical attention immediately by calling 911 or calling your MD immediately  if symptoms less severe.  You Must read complete instructions/literature along with all the possible adverse reactions/side effects for all the Medicines you take and that have been prescribed to you. Take any new Medicines after you have completely understood and accpet all the possible adverse reactions/side effects.   Please note  You were cared for by a hospitalist during your hospital stay. If you have any questions about your discharge medications or the care you received while you were in the hospital after you are discharged, you can call the unit and asked to speak with the hospitalist on call if the hospitalist that took care of you is not available. Once you are discharged, your primary care physician will handle any further medical issues. Please note that NO REFILLS for any discharge medications will be authorized once you are discharged, as it is imperative that you return to your primary care physician (or establish a relationship with a primary care  physician if you do not have one) for your aftercare needs so that they can reassess your need for medications and monitor your lab values.     Today   Still feels weak.  No CP, SOB, Ab pain, N/V.  Afebrile.   VITAL SIGNS:  Blood pressure 101/37, pulse 71, temperature 98.1 F (36.7 C), temperature source Oral, resp. rate 20, height 5' (1.524 m), weight 90 kg (198 lb 6.6 oz), SpO2 99 %.  I/O:   Intake/Output Summary (Last 24 hours) at 03/06/15 1723 Last data filed at 03/06/15 1337  Gross per 24 hour  Intake    120 ml  Output      0 ml  Net    120 ml    PHYSICAL EXAMINATION:    GENERAL: 75 y.o.-year-old patient lying in the bed in no acute distress.  EYES: Pupils equal, round, reactive to light and accommodation. No scleral icterus. Extraocular muscles intact.  HEENT: Head atraumatic, normocephalic. Oropharynx and nasopharynx clear.  NECK: Supple, no jugular venous distention. No thyroid enlargement, no tenderness.  LUNGS: Normal breath sounds bilaterally, no wheezing, rales, rhonchi. No use of accessory muscles of respiration.  CARDIOVASCULAR: S1, S2 normal. No murmurs, rubs, or gallops.  ABDOMEN: Soft, nontender, nondistended. Bowel sounds present. No organomegaly or mass.  EXTREMITIES: No cyanosis, clubbing, +1 edema b/l.  NEUROLOGIC: Cranial nerves II through XII are intact. No focal Motor or sensory deficits b/l. Globally weak PSYCHIATRIC: The patient is alert and oriented x 3.  SKIN: No obvious rash, lesion, or ulcer.   Left upper extremity AV fistula with good bruit and good flow.  DATA REVIEW:   CBC  Recent Labs Lab 03/06/15 0838  WBC 10.3  HGB 8.1*  HCT 26.3*  PLT 140*    Chemistries   Recent Labs Lab 03/04/15 1902 03/06/15 0838  NA 136 134*  K 4.0 5.2*  CL 97* 97*  CO2 26 27  GLUCOSE 171* 108*  BUN 23* 52*  CREATININE 3.23* 5.48*  CALCIUM 8.4* 8.2*  MG 2.3  --   AST 51*  --   ALT 28  --   ALKPHOS 113  --   BILITOT 0.7  --      Cardiac Enzymes  Recent Labs Lab 03/04/15 2100  TROPONINI 0.15*    Microbiology Results  Results for orders placed or performed during the hospital encounter of 03/04/15  MRSA PCR Screening     Status: None  Collection Time: 03/05/15  8:09 AM  Result Value Ref Range Status   MRSA by PCR NEGATIVE NEGATIVE Final    Comment:        The GeneXpert MRSA Assay (FDA approved for NASAL specimens only), is one component of a comprehensive MRSA colonization surveillance program. It is not intended to diagnose MRSA infection nor to guide or monitor treatment for MRSA infections.     RADIOLOGY:  No results found.    Management plans discussed with the patient, family and they are in agreement.  CODE STATUS:     Code Status Orders        Start     Ordered   03/05/15 0122  Full code   Continuous     03/05/15 0121    Code Status History    Date Active Date Inactive Code Status Order ID Comments User Context   01/15/2015  1:22 PM 01/15/2015  5:50 PM Full Code [id number]  Renford Dills, MD Inpatient   12/07/2014 10:12 PM 12/19/2014  6:16 PM Full Code [id number]  Wyatt Haste, MD ED   10/01/2014  8:51 AM 10/05/2014  6:57 PM Full Code [id number]  Arnaldo Natal, MD Inpatient   08/09/2014 11:21 AM 08/09/2014  5:05 PM Full Code [id number]  Kennedy Bucker, MD Inpatient      TOTAL TIME TAKING CARE OF THIS PATIENT: 40 minutes.    Houston Siren M.D on 03/06/2015 at 5:23 PM  Between 7am to 6pm - Pager - [phone]  After 6pm go to [email or web address] - password [email or web address] [facility]  Office  [phone]  CC: Primary care physician; Bobbye Morton, MD

## 2015-03-06 NOTE — Progress Notes (Signed)
Pt to transport home via EMS, [facility] will cover because they are unable to transport patient, VS WDL after HD but BP is low, SBP in low 100s, but DBP is in high 30s, this appears to be patient's baseline, she ambulated to BR with 1 assist + walker.  No new scrips, f/u done, pt verbalized understanding.  EMS will transport.

## 2015-03-06 NOTE — Progress Notes (Signed)
[facility]  ROUNDING NOTE   Subjective:   Seen and examined on hemodialysis. Tolerating treatment well. Midodrine now for hypotension.   Still complains of being weak.   Objective:  Vital signs in last 24 hours:  Temp:  [97.9 F (36.6 C)-98.6 F (37 C)] 98.1 F (36.7 C) (02/15 0458) Pulse Rate:  [54-93] 93 (02/15 0458) Resp:  [18-19] 19 (02/14 2006) BP: (80-126)/(41-93) 94/52 mmHg (02/15 0458) SpO2:  [92 %-96 %] 94 % (02/15 0821) Weight:  [89.994 kg (198 lb 6.4 oz)] 89.994 kg (198 lb 6.4 oz) (02/15 0455)  Weight change: 0.181 kg (6.4 oz) Filed Weights   03/04/15 1827 03/05/15 0135 03/06/15 0455  Weight: 89.812 kg (198 lb) 89.54 kg (197 lb 6.4 oz) 89.994 kg (198 lb 6.4 oz)    Intake/Output: I/O last 3 completed shifts: In: 592.5 [P.O.:120; I.V.:472.5] Out: 500 [Urine:500]   Intake/Output this shift:     Physical Exam: General: NAD, laying in bed  Head: Normocephalic, atraumatic. Moist oral mucosal membranes  Eyes: Anicteric, PERRL  Neck: Supple, trachea midline  Lungs:  Clear to auscultation  Heart: Regular rate and rhythm  Abdomen:  Soft, nontender,   Extremities: no peripheral edema.  Neurologic: Nonfocal, moving all four extremities  Skin: No lesions  Access: LUE AVF    Basic Metabolic Panel:  Recent Labs Lab 03/04/15 1902 03/06/15 0838  NA 136 134*  K 4.0 5.2*  CL 97* 97*  CO2 26 27  GLUCOSE 171* 108*  BUN 23* 52*  CREATININE 3.23* 5.48*  CALCIUM 8.4* 8.2*  MG 2.3  --   PHOS  --  8.1*    Liver Function Tests:  Recent Labs Lab 03/04/15 1902 03/06/15 0838  AST 51*  --   ALT 28  --   ALKPHOS 113  --   BILITOT 0.7  --   PROT 6.4*  --   ALBUMIN 3.5 3.2*   No results for input(s): LIPASE, AMYLASE in the last 168 hours. No results for input(s): AMMONIA in the last 168 hours.  CBC:  Recent Labs Lab 03/04/15 1902 03/06/15 0838  WBC 11.1* 10.3  HGB 8.0* 8.1*  HCT 26.9* 26.3*  MCV 81.5 78.9*  PLT 134* 140*     Cardiac Enzymes:  Recent Labs Lab 03/04/15 1902 03/04/15 2100  TROPONINI 0.13* 0.15*    BNP: Invalid input(s): POCBNP  CBG:  Recent Labs Lab 03/05/15 0133 03/05/15 1225 03/05/15 1637 03/05/15 2122 03/06/15 0739  GLUCAP 166* 148* 171* 150* 107*    Microbiology: Results for orders placed or performed during the hospital encounter of 03/04/15  MRSA PCR Screening     Status: None   Collection Time: 03/05/15  8:09 AM  Result Value Ref Range Status   MRSA by PCR NEGATIVE NEGATIVE Final    Comment:        The GeneXpert MRSA Assay (FDA approved for NASAL specimens only), is one component of a comprehensive MRSA colonization surveillance program. It is not intended to diagnose MRSA infection nor to guide or monitor treatment for MRSA infections.     Coagulation Studies: No results for input(s): LABPROT, INR in the last 72 hours.  Urinalysis:  Recent Labs  03/04/15 2010  COLORURINE AMBER*  LABSPEC 1.020  PHURINE 5.0  GLUCOSEU NEGATIVE  HGBUR NEGATIVE  BILIRUBINUR 1+*  KETONESUR TRACE*  PROTEINUR 30*  NITRITE NEGATIVE  LEUKOCYTESUR 1+*      Imaging: No results found.   Medications:     . aspirin EC  81 mg Oral Daily  . budesonide  0.25 mg Nebulization BID  . calcium acetate  667 mg Oral TID WC  . clopidogrel  75 mg Oral Daily  . docusate sodium  100 mg Oral BID  . famotidine  20 mg Oral QHS  . feeding supplement (NEPRO CARB STEADY)  237 mL Oral Daily  . fluticasone  2 spray Each Nare Daily  . heparin  5,000 Units Subcutaneous 3 times per day  . insulin aspart  0-9 Units Subcutaneous TID WC  . insulin glargine  30 Units Subcutaneous QHS  . isosorbide mononitrate  60 mg Oral Daily  . levothyroxine  75 mcg Oral QAC breakfast  . lidocaine  1 patch Transdermal QHS  . loratadine  10 mg Oral Daily  . midodrine  5 mg Oral TID WC  . mometasone-formoterol  2 puff Inhalation BID  . pantoprazole  40 mg Oral BID  . polyvinyl alcohol  1 drop Both  Eyes TID  . pregabalin  75 mg Oral QHS  . psyllium  1 packet Oral Daily  . rOPINIRole  2 mg Oral QHS  . sodium chloride flush  3 mL Intravenous Q12H  . traZODone  50 mg Oral QHS  . trolamine salicylate  1 application Topical QID  . venlafaxine XR  37.5 mg Oral QHS   acetaminophen **OR** acetaminophen, albuterol, benzonatate, glucose, HYDROcodone-acetaminophen, ipratropium-albuterol, loperamide, mineral oil-hydrophilic petrolatum, morphine injection, nitroGLYCERIN, ondansetron **OR** ondansetron (ZOFRAN) IV, REPLENS, triamcinolone  Assessment/ Plan:  [name] is a 75 y.o. white female  with end-stage renal disease, carotid stenosis, coronary artery disease, diabetes type 2, history of myocardial infarction, hypertension, peripheral vascular disease.  MWF [facility]  1. ESRD on HD ZOX:WRUE and examined on hemodialysis. Tolerating treatment well. Continue MWF schedule  2. Anemia of CKD: hemoglobin of 8.1 - continue Epogen 10,000 units IV with dialysis while inpatient. Gets micera as outpatient.   3. Secondary Hyperparathyroidism:  - calcium acetate with meals  4. Hypotension: improved - started midodrine  tid - also takes imdur for coronary artery disease   LOS:  Savannah Key 2/15/201710:27 AM

## 2015-03-06 NOTE — Care Management (Signed)
Patient has verbalized that she does not want to go to any skilled nursing but [facility] only provided physical therapy twice weekly.  Left voicemail for Kim at [facility] regarding need to increase therapies. Patient does have dialysis three times weekly

## 2015-03-07 LAB — CBC
HCT: 25.8 % — ABNORMAL LOW (ref 35.0–47.0)
HCT: 23.1 % — ABNORMAL LOW (ref 35.0–47.0)
Hemoglobin: 7.9 g/dL — ABNORMAL LOW (ref 12.0–16.0)
Hemoglobin: 7.1 g/dL — ABNORMAL LOW (ref 12.0–16.0)
MCH: 24.8 pg — ABNORMAL LOW (ref 26.0–34.0)
MCH: 24.8 pg — ABNORMAL LOW (ref 26.0–34.0)
MCHC: 30.4 g/dL — ABNORMAL LOW (ref 32.0–36.0)
MCHC: 30.7 g/dL — ABNORMAL LOW (ref 32.0–36.0)
MCV: 81.3 fL (ref 80.0–100.0)
MCV: 80.8 fL (ref 80.0–100.0)
PLATELETS: 135 10*3/uL — AB (ref 150–440)
PLATELETS: 125 10*3/uL — AB (ref 150–440)
RBC: 3.18 MIL/uL — ABNORMAL LOW (ref 3.80–5.20)
RBC: 2.86 MIL/uL — ABNORMAL LOW (ref 3.80–5.20)
RDW: 22.5 % — ABNORMAL HIGH (ref 11.5–14.5)
RDW: 22 % — AB (ref 11.5–14.5)
WBC: 9.6 10*3/uL (ref 3.6–11.0)
WBC: 8.6 10*3/uL (ref 3.6–11.0)

## 2015-03-07 LAB — BASIC METABOLIC PANEL
Anion gap: 9 (ref 5–15)
BUN: 32 mg/dL — AB (ref 6–20)
CHLORIDE: 99 mmol/L — AB (ref 101–111)
CO2: 29 mmol/L (ref 22–32)
CREATININE: 3.96 mg/dL — AB (ref 0.44–1.00)
Calcium: 8 mg/dL — ABNORMAL LOW (ref 8.9–10.3)
GFR calc Af Amer: 12 mL/min — ABNORMAL LOW (ref >60)
GFR calc non Af Amer: 10 mL/min — ABNORMAL LOW (ref >60)
Glucose, Bld: 168 mg/dL — ABNORMAL HIGH (ref 65–99)
Potassium: 4.5 mmol/L (ref 3.5–5.1)
SODIUM: 137 mmol/L (ref 135–145)

## 2015-03-07 LAB — TROPONIN I
TROPONIN I: 1.62 ng/mL — AB (ref <0.031)
TROPONIN I: 1.57 ng/mL — AB (ref <0.031)
TROPONIN I: 1.68 ng/mL — AB (ref <0.031)

## 2015-03-07 LAB — GLUCOSE, CAPILLARY
GLUCOSE-CAPILLARY: 135 mg/dL — AB (ref 65–99)
GLUCOSE-CAPILLARY: 161 mg/dL — AB (ref 65–99)
GLUCOSE-CAPILLARY: 209 mg/dL — AB (ref 65–99)
Glucose-Capillary: 122 mg/dL — ABNORMAL HIGH (ref 65–99)
Glucose-Capillary: 184 mg/dL — ABNORMAL HIGH (ref 65–99)

## 2015-03-07 LAB — APTT: aPTT: 28 seconds (ref 24–36)

## 2015-03-07 LAB — CREATININE, SERUM
Creatinine, Ser: 3.82 mg/dL — ABNORMAL HIGH (ref 0.44–1.00)
GFR calc non Af Amer: 11 mL/min — ABNORMAL LOW (ref >60)
GFR, EST AFRICAN AMERICAN: 12 mL/min — AB (ref >60)

## 2015-03-07 LAB — HEPATITIS B SURFACE ANTIGEN: HEP B S AG: NEGATIVE

## 2015-03-07 LAB — HEPARIN LEVEL (UNFRACTIONATED): HEPARIN UNFRACTIONATED: 0.45 [IU]/mL (ref 0.30–0.70)

## 2015-03-07 LAB — PROTIME-INR
INR: 1.2
Prothrombin Time: 15.4 seconds — ABNORMAL HIGH (ref 11.4–15.0)

## 2015-03-07 LAB — ABO/RH: ABO/RH(D): A POS

## 2015-03-07 MED ORDER — HEPARIN (PORCINE) IN NACL 100-0.45 UNIT/ML-% IJ SOLN
800.0000 [IU]/h | INTRAMUSCULAR | Status: DC
  Administered 2015-03-07: 800 [IU]/h via INTRAVENOUS
  Filled 2015-03-07: qty 250

## 2015-03-07 MED ORDER — HEPARIN BOLUS VIA INFUSION
4000.0000 [IU] | Freq: Once | INTRAVENOUS | Status: AC
  Administered 2015-03-07: 4000 [IU] via INTRAVENOUS
  Filled 2015-03-07: qty 4000

## 2015-03-07 MED ORDER — CALCIUM ACETATE (PHOS BINDER) 667 MG PO CAPS
2001.0000 mg | ORAL_CAPSULE | Freq: Three times a day (TID) | ORAL | Status: DC
  Administered 2015-03-07 – 2015-03-08 (×3): 2001 mg via ORAL
  Filled 2015-03-07 (×3): qty 3

## 2015-03-07 MED ORDER — ROPINIROLE HCL 1 MG PO TABS
2.0000 mg | ORAL_TABLET | Freq: Every day | ORAL | Status: DC
  Administered 2015-03-07 (×2): 2 mg via ORAL
  Filled 2015-03-07 (×2): qty 2

## 2015-03-07 MED ORDER — MOMETASONE FURO-FORMOTEROL FUM 100-5 MCG/ACT IN AERO
2.0000 | INHALATION_SPRAY | Freq: Two times a day (BID) | RESPIRATORY_TRACT | Status: DC
  Administered 2015-03-07 – 2015-03-08 (×3): 2 via RESPIRATORY_TRACT
  Filled 2015-03-07: qty 8.8

## 2015-03-07 MED ORDER — GUAIFENESIN 100 MG/5ML PO SOLN
10.0000 mL | Freq: Four times a day (QID) | ORAL | Status: DC | PRN
  Administered 2015-03-07 – 2015-03-08 (×2): 200 mg via ORAL
  Filled 2015-03-07 (×2): qty 10

## 2015-03-07 MED ORDER — IPRATROPIUM-ALBUTEROL 0.5-2.5 (3) MG/3ML IN SOLN
3.0000 mL | RESPIRATORY_TRACT | Status: DC | PRN
  Administered 2015-03-08 (×2): 3 mL via RESPIRATORY_TRACT
  Filled 2015-03-07 (×3): qty 3

## 2015-03-07 MED ORDER — HEPARIN SODIUM (PORCINE) 5000 UNIT/ML IJ SOLN: 5000.0000 [IU] | Freq: Three times a day (TID) | INTRAMUSCULAR | Status: DC

## 2015-03-07 MED ORDER — SODIUM CHLORIDE 0.9 % IV BOLUS (SEPSIS)
500.0000 mL | Freq: Once | INTRAVENOUS | Status: AC
  Administered 2015-03-07: 500 mL via INTRAVENOUS

## 2015-03-07 MED ORDER — PANTOPRAZOLE SODIUM 40 MG PO TBEC
40.0000 mg | DELAYED_RELEASE_TABLET | Freq: Two times a day (BID) | ORAL | Status: DC
  Administered 2015-03-07 – 2015-03-08 (×3): 40 mg via ORAL
  Filled 2015-03-07 (×3): qty 1

## 2015-03-07 MED ORDER — BUDESONIDE 0.25 MG/2ML IN SUSP
0.2500 mg | Freq: Two times a day (BID) | RESPIRATORY_TRACT | Status: DC
  Administered 2015-03-08: 0.25 mg via RESPIRATORY_TRACT
  Filled 2015-03-07: qty 2

## 2015-03-07 MED ORDER — BUDESONIDE 0.25 MG/2ML IN SUSP
0.2500 mg | Freq: Two times a day (BID) | RESPIRATORY_TRACT | Status: DC
  Administered 2015-03-07 (×2): 0.25 mg via RESPIRATORY_TRACT
  Filled 2015-03-07 (×2): qty 2

## 2015-03-07 MED ORDER — TRAZODONE HCL 50 MG PO TABS
50.0000 mg | ORAL_TABLET | Freq: Every day | ORAL | Status: DC
  Administered 2015-03-07: 50 mg via ORAL
  Filled 2015-03-07: qty 1

## 2015-03-07 MED ORDER — ACETAMINOPHEN 650 MG RE SUPP: 650.0000 mg | Freq: Four times a day (QID) | RECTAL | Status: DC | PRN

## 2015-03-07 MED ORDER — SODIUM CHLORIDE 0.9 % IV SOLN: Freq: Once | INTRAVENOUS | Status: DC

## 2015-03-07 MED ORDER — INSULIN ASPART 100 UNIT/ML ~~LOC~~ SOLN
0.0000 [IU] | Freq: Every day | SUBCUTANEOUS | Status: DC
  Administered 2015-03-07: 2 [IU] via SUBCUTANEOUS

## 2015-03-07 MED ORDER — SODIUM CHLORIDE 0.9% FLUSH
3.0000 mL | Freq: Two times a day (BID) | INTRAVENOUS | Status: DC
  Administered 2015-03-07 – 2015-03-08 (×3): 3 mL via INTRAVENOUS

## 2015-03-07 MED ORDER — LEVOTHYROXINE SODIUM 75 MCG PO TABS
75.0000 ug | ORAL_TABLET | Freq: Every day | ORAL | Status: DC
  Administered 2015-03-07 – 2015-03-08 (×2): 75 ug via ORAL
  Filled 2015-03-07 (×2): qty 1

## 2015-03-07 MED ORDER — MIDODRINE HCL 5 MG PO TABS
5.0000 mg | ORAL_TABLET | Freq: Three times a day (TID) | ORAL | Status: DC
  Administered 2015-03-07 – 2015-03-08 (×5): 5 mg via ORAL
  Filled 2015-03-07 (×5): qty 1

## 2015-03-07 MED ORDER — INSULIN ASPART 100 UNIT/ML ~~LOC~~ SOLN
0.0000 [IU] | Freq: Three times a day (TID) | SUBCUTANEOUS | Status: DC
  Administered 2015-03-07 (×2): 2 [IU] via SUBCUTANEOUS
  Administered 2015-03-07: 1 [IU] via SUBCUTANEOUS
  Administered 2015-03-08: 2 [IU] via SUBCUTANEOUS
  Filled 2015-03-07 (×4): qty 2
  Filled 2015-03-07: qty 1

## 2015-03-07 MED ORDER — ASPIRIN EC 81 MG PO TBEC
81.0000 mg | DELAYED_RELEASE_TABLET | Freq: Every day | ORAL | Status: DC
  Administered 2015-03-07 – 2015-03-08 (×2): 81 mg via ORAL
  Filled 2015-03-07 (×2): qty 1

## 2015-03-07 MED ORDER — VENLAFAXINE HCL ER 37.5 MG PO CP24
37.5000 mg | ORAL_CAPSULE | Freq: Every day | ORAL | Status: DC
  Administered 2015-03-07 (×2): 37.5 mg via ORAL
  Filled 2015-03-07 (×2): qty 1

## 2015-03-07 MED ORDER — PREGABALIN 50 MG PO CAPS
75.0000 mg | ORAL_CAPSULE | Freq: Every day | ORAL | Status: DC
  Administered 2015-03-07 (×2): 75 mg via ORAL
  Filled 2015-03-07 (×2): qty 1

## 2015-03-07 MED ORDER — ACETAMINOPHEN 325 MG PO TABS
650.0000 mg | ORAL_TABLET | Freq: Four times a day (QID) | ORAL | Status: DC | PRN
  Administered 2015-03-08: 650 mg via ORAL
  Filled 2015-03-07: qty 2

## 2015-03-07 MED ORDER — CLOPIDOGREL BISULFATE 75 MG PO TABS
75.0000 mg | ORAL_TABLET | Freq: Every day | ORAL | Status: DC
  Administered 2015-03-07 – 2015-03-08 (×2): 75 mg via ORAL
  Filled 2015-03-07 (×2): qty 1

## 2015-03-07 NOTE — Progress Notes (Signed)
[facility]  ROUNDING NOTE   Subjective:   Readmitted for bradycardia last night.   Objective:  Vital signs in last 24 hours:  Temp:  [98 F (36.7 C)-98.6 F (37 C)] 98.6 F (37 C) (02/16 0006) Pulse Rate:  [32-108] 67 (02/16 0806) Resp:  [15-29] 22 (02/16 0006) BP: (77-114)/(35-83) 99/43 mmHg (02/16 0806) SpO2:  [92 %-99 %] 96 % (02/16 0006) Weight:  [89.948 kg (198 lb 4.8 oz)-91.808 kg (202 lb 6.4 oz)] 91.808 kg (202 lb 6.4 oz) (02/16 0500)  Weight change:  Filed Weights   03/07/15 0006 03/07/15 0500  Weight: 89.948 kg (198 lb 4.8 oz) 91.808 kg (202 lb 6.4 oz)    Intake/Output: I/O last 3 completed shifts: In: -  Out: 50 [Urine:50]   Intake/Output this shift:     Physical Exam: General: NAD, laying in bed  Head: Normocephalic, atraumatic. Moist oral mucosal membranes  Eyes: Anicteric, PERRL  Neck: Supple, trachea midline  Lungs:  Clear to auscultation  Heart: Regular rate and rhythm  Abdomen:  Soft, nontender,   Extremities: no peripheral edema.  Neurologic: Nonfocal, moving all four extremities  Skin: No lesions  Access: LUE AVF    Basic Metabolic Panel:  Recent Labs Lab 03/04/15 1902 03/06/15 0838 03/06/15 1808 03/07/15 0100 03/07/15 0411  NA 136 134* 137  --  137  K 4.0 5.2* 4.1  --  4.5  CL 97* 97* 97*  --  99*  CO2 --  29  GLUCOSE 171* 108* 179*  --  168*  BUN 23* 52* 25*  --  32*  CREATININE 3.23* 5.48* 3.26* 3.82* 3.96*  CALCIUM 8.4* 8.2* 8.3*  --  8.0*  MG 2.3  --   --   --   --   PHOS  --  8.1*  --   --   --     Liver Function Tests:  Recent Labs Lab 03/04/15 1902 03/06/15 0838  AST 51*  --   ALT 28  --   ALKPHOS 113  --   BILITOT 0.7  --   PROT 6.4*  --   ALBUMIN 3.5 3.2*   No results for input(s): LIPASE, AMYLASE in the last 168 hours. No results for input(s): AMMONIA in the last 168 hours.  CBC:  Recent Labs Lab 03/04/15 1902 03/06/15 0838 03/06/15 1808 03/07/15 0100 03/07/15 0411  WBC  11.1* 10.3 9.9 9.6 8.6  NEUTROABS  --   --  7.2*  --   --   HGB 8.0* 8.1* 7.6* 7.9* 7.1*  HCT 26.9* 26.3* 24.8* 25.8* 23.1*  MCV 81.5 78.9* 80.0 81.3 80.8  PLT 134* 140* 146* 135* 125*    Cardiac Enzymes:  Recent Labs Lab 03/04/15 1902 03/04/15 2100 03/07/15 0100 03/07/15 0411  TROPONINI 0.13* 0.15* 1.62* 1.57*    BNP: Invalid input(s): POCBNP  CBG:  Recent Labs Lab 03/05/15 2122 03/06/15 0739 03/06/15 1421 03/07/15 0005 03/07/15 0739  GLUCAP 150* 107* 125* 122* 135*    Microbiology: Results for orders placed or performed during the hospital encounter of 03/04/15  MRSA PCR Screening     Status: None   Collection Time: 03/05/15  8:09 AM  Result Value Ref Range Status   MRSA by PCR NEGATIVE NEGATIVE Final    Comment:        The GeneXpert MRSA Assay (FDA approved for NASAL specimens only), is one component of a comprehensive MRSA colonization surveillance program. It is not intended to diagnose MRSA  infection nor to guide or monitor treatment for MRSA infections.     Coagulation Studies:  Recent Labs  03/07/15 0411  LABPROT 15.4*  INR 1.20    Urinalysis:  Recent Labs  03/04/15 2010  COLORURINE AMBER*  LABSPEC 1.020  PHURINE 5.0  GLUCOSEU NEGATIVE  HGBUR NEGATIVE  BILIRUBINUR 1+*  KETONESUR TRACE*  PROTEINUR 30*  NITRITE NEGATIVE  LEUKOCYTESUR 1+*      Imaging: Dg Chest Portable 1 View  03/06/2015  CLINICAL DATA:  Labored breathing. EXAM: PORTABLE CHEST 1 VIEW COMPARISON:  12/11/2014.  Chest CT from 12/11/2014. FINDINGS: 1923 hours. The cardio pericardial silhouette is enlarged. No pulmonary edema or focal airspace consolidation. No evidence for pleural effusion. Scattered nodular opacity seen on the previous chest CT are not evident on today's x-ray. The visualized bony structures of the thorax are intact. IMPRESSION: Stable compared to prior chest x-ray. Cardiomegaly without overt pulmonary edema or focal airspace consolidation.  Electronically Signed   By: Kennith Center M.D.   On: 03/06/2015 19:39     Medications:   . heparin 800 Units/hr (03/07/15 0552)   . aspirin EC  81 mg Oral Daily  . budesonide  0.25 mg Nebulization BID  . clopidogrel  75 mg Oral Daily  . heparin  5,000 Units Subcutaneous 3 times per day  . insulin aspart  0-5 Units Subcutaneous QHS  . insulin aspart  0-9 Units Subcutaneous TID WC  . levothyroxine  75 mcg Oral QAC breakfast  . midodrine  5 mg Oral TID WC  . mometasone-formoterol  2 puff Inhalation BID  . pantoprazole  40 mg Oral BID AC  . pregabalin  75 mg Oral QHS  . rOPINIRole  2 mg Oral QHS  . sodium chloride flush  3 mL Intravenous Q12H  . venlafaxine XR  37.5 mg Oral QHS   acetaminophen **OR** acetaminophen, ipratropium-albuterol, ondansetron **OR** ondansetron (ZOFRAN) IV  Assessment/ Plan:  [name] is a 75 y.o. white female  with end-stage renal disease, carotid stenosis, coronary artery disease, diabetes type 2, history of myocardial infarction, hypertension, peripheral vascular disease.  MWF [facility]  1. ESRD on HD ZOX:WRUE and examined.Tolerated treatment well yesterday. Continue MWF schedule  2. Anemia of CKD: hemoglobin of 7.1 - continue Epogen 10,000 units IV with dialysis while inpatient. Gets micera as outpatient.   3. Secondary Hyperparathyroidism: hyperphosphatemia with phos of 8.1 - calcium acetate with meals  4. Hypotension: and now with bradycardia - started midodrine  tid on last admission - also takes imdur for coronary artery disease - appreciate Cardiology input.    LOS:  Wynelle Link, Threasa Heads 2/16/20179:54 AM

## 2015-03-07 NOTE — Progress Notes (Signed)
Pt requesting trazodone for sleep and robitussin for cough. MD Dr. Judithann Sheen notified, orders received. RN will administer and continue to monitor. Syliva Overman, RN

## 2015-03-07 NOTE — Evaluation (Signed)
Physical Therapy Evaluation Patient Details Name: Savannah Key MRN: [id number] DOB: Oct 13, 1940 Today's Date: 03/07/2015   History of Present Illness  presented to ER and admitted under observation secondary to SOB, chest pain and bradycardia.  Clinical Impression  Upon evaluation, patient alert and oriented to basic information; depressed affect and visibly tearful at times, expressing frustration with "not getting better".  Patient interested in speaking with chaplain (RNCM informed/aware; placed necessary order).  Bilat UE/LE generally weak and deconditioned, but grossly functional for simple transfers and short-distance mobility.  Currently requiring min assist for all mobility with RW.  Very poor standing balance and postural control; noted with increased sway in all planes requiring hands-on assist from therapist for recovery and fall prevention.  Patient generally hypotensive throughout evaluation, with positive orthostatics with transition to upright (See vitals flowsheet; BP 95/34 end of session).  Unsafe/unable to tolerate additional mobility at this time. Would benefit from skilled PT to address above deficits and promote optimal return to PLOF; recommend transition to STR upon discharge from acute hospitalization.     Follow Up Recommendations SNF    Equipment Recommendations       Recommendations for Other Services       Precautions / Restrictions Precautions Precautions: Fall Precaution Comments: No BP L UE Restrictions Weight Bearing Restrictions: No      Mobility  Bed Mobility Overal bed mobility: Modified Independent Bed Mobility: Supine to Sit     Supine to sit: Modified independent (Device/Increase time)     General bed mobility comments: HOB elevated, heavy use of bedrail  Transfers Overall transfer level: Needs assistance Equipment used: Rolling walker (2 wheeled) Transfers: Sit to/from Stand Sit to Stand: Min assist             Ambulation/Gait Ambulation/Gait assistance: Architect (Feet): 5 Feet Assistive device: Rolling walker (2 wheeled)       General Gait Details: broad BOS with short, choppy steps; no overt buckling but increased postural sway in all directions requiring min assist from therapist x2 during simple transfer to prevent LOB  Stairs            Wheelchair Mobility    Modified Rankin (Stroke Patients Only)       Balance Overall balance assessment: Needs assistance Sitting-balance support: No upper extremity supported;Feet supported Sitting balance-Leahy Scale: Good     Standing balance support: Bilateral upper extremity supported Standing balance-Leahy Scale: Poor                               Pertinent Vitals/Pain Pain Assessment: No/denies pain    Home Living Family/patient expects to be discharged to:: Private residence Living Arrangements: Spouse/significant other Available Help at Discharge: Family Type of Home: House Home Access: Ramped entrance     Home Layout: One level Home Equipment: Environmental consultant - 4 wheels;Tub bench;Wheelchair - manual      Prior Function           Comments: Mod indep for ADLs and household mobility with 4WRW.  [facility]--goes to center for PT services 2x/week     Hand Dominance        Extremity/Trunk Assessment   Upper Extremity Assessment: Generalized weakness           Lower Extremity Assessment: Generalized weakness (grossly 3+/5 throughout)         Communication   Communication: No difficulties  Cognition Arousal/Alertness: Awake/alert Behavior During Therapy:  (  depressed affect, tearful at times-"I just don't feel like I'm getting better") Overall Cognitive Status: Within Functional Limits for tasks assessed                      General Comments      Exercises        Assessment/Plan    PT Assessment Patient needs continued PT services  PT  Diagnosis Difficulty walking;Generalized weakness   PT Problem List Decreased strength;Decreased activity tolerance;Decreased balance;Decreased mobility;Obesity;Decreased safety awareness  PT Treatment Interventions Gait training;Therapeutic activities;Therapeutic exercise;Balance training;Patient/family education;Functional mobility training;DME instruction   PT Goals (Current goals can be found in the Care Plan section) Acute Rehab PT Goals Patient Stated Goal: to go home with my husband PT Goal Formulation: With patient Time For Goal Achievement: 03/19/15 Potential to Achieve Goals: Fair    Frequency Min 2X/week   Barriers to discharge Inaccessible home environment;Decreased caregiver support      Co-evaluation               End of Session Equipment Utilized During Treatment: Gait belt Activity Tolerance: Patient limited by fatigue;Treatment limited secondary to medical complications (Comment) (orthostasis) Patient left: in chair;with call bell/phone within reach;with chair alarm set Nurse Communication: Mobility status (vitals response to upright; recommend toilet to BSC only (no ambulating to/from bathroom due to orthostasis))    Functional Assessment Tool Used: clinical judgement Functional Limitation: Mobility: Walking and moving around Mobility: Walking and Moving Around Current Status (Z6109): At least 40 percent but less than 60 percent impaired, limited or restricted Mobility: Walking and Moving Around Goal Status [phone]): At least 20 percent but less than 40 percent impaired, limited or restricted    Time: 1153-1214 PT Time Calculation (min) (ACUTE ONLY): 21 min   Charges:         PT G Codes:   PT G-Codes **NOT FOR INPATIENT CLASS** Functional Assessment Tool Used: clinical judgement Functional Limitation: Mobility: Walking and moving around Mobility: Walking and Moving Around Current Status (U9811): At least 40 percent but less than 60 percent impaired,  limited or restricted Mobility: Walking and Moving Around Goal Status [phone]): At least 20 percent but less than 40 percent impaired, limited or restricted    Valor Quaintance H. Manson Passey, PT, DPT, NCS 03/07/2015, 5:22 PM [phone]

## 2015-03-07 NOTE — Care Management Note (Signed)
Patient is active [facility] on MWF.  Clinic notified of admission and will send them additional records at discharge.  Ivor Reining    Dialysis Coordinator   [phone]

## 2015-03-07 NOTE — Progress Notes (Signed)
Pt first troponin was elevated at 1.62. MD Anne Hahn notified. Orders given to start heparin gtt. Will continue to monitor.   Mayra Neer M

## 2015-03-07 NOTE — Progress Notes (Signed)
Dr. Amado Coe made aware of Troponin 1.68. Per MD continue heparin drip and wait for cardiologist to see patient. Will continue to monitor patient. Rudean Haskell

## 2015-03-07 NOTE — Progress Notes (Signed)
Skin assessment completed with Aleda Grana, RN

## 2015-03-07 NOTE — Progress Notes (Signed)
ANTICOAGULATION CONSULT NOTE - Initial Consult  Pharmacy Consult for heparin Indication: chest pain/ACS  Allergies  Allergen Reactions  . Sulfa Antibiotics Other (See Comments)    Reaction:  Blood in the urine     Patient Measurements: Weight: 198 lb 4.8 oz (89.948 kg) Heparin Dosing Weight: 66.8 kg  Vital Signs: Temp: 98.6 F (37 C) (02/16 0006) Temp Source: Oral (02/16 0006) BP: 84/35 mmHg (02/16 0153) Pulse Rate: 66 (02/16 0153)  Labs:  Recent Labs  03/04/15 1902 03/04/15 2100 03/06/15 0838 03/06/15 1808 03/07/15 0100  HGB 8.0*  --  8.1* 7.6* 7.9*  HCT 26.9*  --  26.3* 24.8* 25.8*  PLT 134*  --  140* 146* 135*  CREATININE 3.23*  --  5.48* 3.26* 3.82*  TROPONINI 0.13* 0.15*  --   --  1.62*    Estimated Creatinine Clearance: 13.1 mL/min (by C-G formula based on Cr of 3.82).   Medical History: Past Medical History  Diagnosis Date  . Chronic kidney disease   . GERD (gastroesophageal reflux disease)   . Carotid artery stenosis   . Coronary artery disease   . Hypothyroidism   . Diabetes mellitus without complication (HCC)   . Depression   . Arthritis   . Kidney failure   . Liver failure (HCC)   . MI (myocardial infarction) (HCC)   . HTN (hypertension)   . Broken leg     R  . CHF (congestive heart failure) (HCC)     Medications:  Infusions:  . heparin      Assessment: 74 yof cc hypotension/bradycardia with positive troponin. Pharmacy consulted to dose heparin for ACS/STEMI.  Goal of Therapy:  Heparin level 0.3-0.7 units/ml Monitor platelets by anticoagulation protocol: Yes   Plan:  Give 4000 units bolus x 1 Start heparin infusion at 800 units/hr Check anti-Xa level in 8 hours and daily while on heparin Continue to monitor H&H and platelets  Carola Frost, Pharm.D, BCPS Clinical Pharmacist 03/07/2015,2:33 AM

## 2015-03-07 NOTE — Progress Notes (Signed)
[facility] [place] at [facility]   PATIENT NAME: Savannah Key    MR#:  [id number]  DATE OF BIRTH:  Jun 13, 1940  SUBJECTIVE:  CHIEF COMPLAINT:  Patient reports dizziness, denies chest pain but not feeling right. Feeling weak and tired. per telemetry monitor heart rate is at 60's  REVIEW OF SYSTEMS:  CONSTITUTIONAL: No fever, fatigue or weakness.  EYES: No blurred or double vision.  EARS, NOSE, AND THROAT: No tinnitus or ear pain.  RESPIRATORY: No cough, shortness of breath, wheezing or hemoptysis.  CARDIOVASCULAR: No chest pain, orthopnea, edema.  GASTROINTESTINAL: No nausea, vomiting, diarrhea or abdominal pain.  GENITOURINARY: No dysuria, hematuria.  ENDOCRINE: No polyuria, nocturia,  HEMATOLOGY: No anemia, easy bruising or bleeding SKIN: No rash or lesion. MUSCULOSKELETAL: No joint pain or arthritis.   NEUROLOGIC: No tingling, numbness, weakness.  PSYCHIATRY: No anxiety or depression.   DRUG ALLERGIES:   Allergies  Allergen Reactions  . Sulfa Antibiotics Other (See Comments)    Reaction:  Blood in the urine     VITALS:  Blood pressure 88/33, pulse 64, temperature 98.3 F (36.8 C), temperature source Oral, resp. rate 18, height 5' (1.524 m), weight 91.808 kg (202 lb 6.4 oz), SpO2 97 %.  PHYSICAL EXAMINATION:  GENERAL:  75 y.o.-year-old patient lying in the bed with no acute distress.  EYES: Pupils equal, round, reactive to light and accommodation. No scleral icterus. Extraocular muscles intact.  HEENT: Head atraumatic, normocephalic. Oropharynx and nasopharynx clear.  NECK:  Supple, no jugular venous distention. No thyroid enlargement, no tenderness.  LUNGS: Normal breath sounds bilaterally, no wheezing, rales,rhonchi or crepitation. No use of accessory muscles of respiration.  CARDIOVASCULAR: S1, S2 normal. No murmurs, rubs, or gallops.  ABDOMEN: Soft, nontender, nondistended. Bowel sounds present. No organomegaly or mass.  EXTREMITIES: No  pedal edema, cyanosis, or clubbing.  NEUROLOGIC: Cranial nerves II through XII are intact. Muscle strength 5/5 in all extremities. Sensation intact. Gait not checked.  PSYCHIATRIC: The patient is alert and oriented x 3.  SKIN: No obvious rash, lesion, or ulcer.    LABORATORY PANEL:   CBC  Recent Labs Lab 03/07/15 0411  WBC 8.6  HGB 7.1*  HCT 23.1*  PLT 125*   ------------------------------------------------------------------------------------------------------------------  Chemistries   Recent Labs Lab 03/04/15 1902  03/07/15 0411  NA 136  < > 137  K 4.0  < > 4.5  CL 97*  < > 99*  CO2 26  < > 29  GLUCOSE 171*  < > 168*  BUN 23*  < > 32*  CREATININE 3.23*  < > 3.96*  CALCIUM 8.4*  < > 8.0*  MG 2.3  --   --   AST 51*  --   --   ALT 28  --   --   ALKPHOS 113  --   --   BILITOT 0.7  --   --   < > = values in this interval not displayed. ------------------------------------------------------------------------------------------------------------------  Cardiac Enzymes  Recent Labs Lab 03/07/15 1031  TROPONINI 1.68*   ------------------------------------------------------------------------------------------------------------------  RADIOLOGY:  Dg Chest Portable 1 View  03/06/2015  CLINICAL DATA:  Labored breathing. EXAM: PORTABLE CHEST 1 VIEW COMPARISON:  12/11/2014.  Chest CT from 12/11/2014. FINDINGS: 1923 hours. The cardio pericardial silhouette is enlarged. No pulmonary edema or focal airspace consolidation. No evidence for pleural effusion. Scattered nodular opacity seen on the previous chest CT are not evident on today's x-ray. The visualized bony structures of the thorax are intact. IMPRESSION: Stable compared  to prior chest x-ray. Cardiomegaly without overt pulmonary edema or focal airspace consolidation. Electronically Signed   By: Kennith Center M.D.   On: 03/06/2015 19:39    EKG:   Orders placed or performed during the hospital encounter of 03/06/15  .  EKG 12-Lead  . EKG 12-Lead  . EKG 12-Lead  . EKG 12-Lead    ASSESSMENT AND PLAN:    #sinus  Bradycardia -  Heart rate is clinically better after discontinuing rate limiting drugs  monitor with telemetry  cardiology consult -kc is pendin   #Chronic systolic (congestive) heart failure (HCC) - does not seem to be in acute exacerbation, continue home meds  # CAD (coronary artery disease) - continue home meds Troponin is elevated but no significant trending-1.62-1.57-1.68, could be from underlying end-stage renal disease On heparin drip Cardiology consult-k[facility] is pending   # Anxiety - home dose anxiolytics when necessary  # Type 2 diabetes mellitus (HCC) - sliding scale insulin with corresponding glucose checks are modified diet  # ESRD on dialysis [facility]) - nephrology consult for dialysis support-Monday Wednesday and Friday On Epo. Hemoglobin is low at 7.1 today, will consider transfusing 1 unit of blood tomorrow during hemodialysis if okay with nephrology   #GERD (gastroesophageal reflux disease) - home dose PPI   #Hypothyroidism - home dose thyroid replacement     All the records are reviewed and case discussed with Care Management/Social Workerr. Management plans discussed with the patient,and she is in agreement.  CODE STATUS:  Full code TOTAL TIME TAKING CARE OF THIS PATIENT: 35 minutes.   POSSIBLE D/C IN 1-2 DAYS, DEPENDING ON CLINICAL CONDITION.   Ramonita Lab M.D on 03/07/2015 at 3:12 PM  Between 7am to 6pm - Pager - [phone] After 6pm go to [email or web address] - password [email or web address] [facility]  [place] [facility]  Office  [phone]  CC: Primary care physician; Bobbye Morton, MD

## 2015-03-07 NOTE — Progress Notes (Signed)
ANTICOAGULATION CONSULT NOTE - Initial Consult  Pharmacy Consult for heparin Indication: chest pain/ACS  Allergies  Allergen Reactions  . Sulfa Antibiotics Other (See Comments)    Reaction:  Blood in the urine     Patient Measurements: Height: 5' (152.4 cm) Weight: 202 lb 6.4 oz (91.808 kg) IBW/kg (Calculated) : 45.5 Heparin Dosing Weight: 66.8 kg  Vital Signs: Temp: 98.3 F (36.8 C) (02/16 1120) Temp Source: Oral (02/16 1120) BP: 88/33 mmHg (02/16 1120) Pulse Rate: 64 (02/16 1120)  Labs:  Recent Labs  03/06/15 1808 03/07/15 0100 03/07/15 0411 03/07/15 1031 03/07/15 1338  HGB 7.6* 7.9* 7.1*  --   --   HCT 24.8* 25.8* 23.1*  --   --   PLT 146* 135* 125*  --   --   APTT  --   --  28  --   --   LABPROT  --   --  15.4*  --   --   INR  --   --  1.20  --   --   HEPARINUNFRC  --   --   --   --  0.45  CREATININE 3.26* 3.82* 3.96*  --   --   TROPONINI  --  1.62* 1.57* 1.68*  --     Estimated Creatinine Clearance: 12.8 mL/min (by C-G formula based on Cr of 3.96).   Medical History: Past Medical History  Diagnosis Date  . Chronic kidney disease   . GERD (gastroesophageal reflux disease)   . Carotid artery stenosis   . Coronary artery disease   . Hypothyroidism   . Diabetes mellitus without complication (HCC)   . Depression   . Arthritis   . Kidney failure   . Liver failure (HCC)   . MI (myocardial infarction) (HCC)   . HTN (hypertension)   . Broken leg     R  . CHF (congestive heart failure) (HCC)     Medications:  Infusions:  . heparin 800 Units/hr (03/07/15 0552)    Assessment: 74 yof cc hypotension/bradycardia with positive troponin. Pharmacy consulted to dose heparin for ACS/STEMI.  Goal of Therapy:  Heparin level 0.3-0.7 units/ml Monitor platelets by anticoagulation protocol: Yes   Plan:  HL is therapeutic. Will continue same rate 800 units/hr and recheck in 8 hours.   Olene Floss, Pharm.D Clinical Pharmacist 03/07/2015,2:30 PM

## 2015-03-07 NOTE — Consult Note (Signed)
[facility]  CARDIOLOGY CONSULT NOTE  Patient ID: Savannah Key MRN: [id number] DOB/AGE: 74-11-1940 74 y.o.  Admit date: 03/06/2015 Referring Physician Dr. Amado Coe Primary Physician   Primary Cardiologist Dr. Juliann Pares Reason for Consultation bradycardia/troponin elevation/ckd  HPI: Pt is a 75 yo female with history of esrd on hd with serum creatinine of 3.26 with a gfr of 13, cad s/p pci on duap therapy including asa and plavix , history of dm who was recently discharged from the hospital and when ems was taking [facility], she was noted to be bradycardic and was returned to the er. She was noted to have sr with frequent pvcs and bigeminy which was likely the cause of her bradycardia. SHe was noted to have elevated serum troponin withno ischemic changes on her ekg or chest pain. LIkely secondary to her renal insuffiency. She is currently stable with heart rates in the mid 60's.   Review of Systems  Constitutional: Positive for malaise/fatigue.  HENT: Negative.   Eyes: Negative.   Respiratory: Positive for shortness of breath.   Cardiovascular: Positive for leg swelling.  Gastrointestinal: Negative.   Genitourinary: Negative.   Musculoskeletal: Negative.   Neurological: Positive for dizziness and weakness.  Endo/Heme/Allergies: Negative.   Psychiatric/Behavioral: Negative.     Past Medical History  Diagnosis Date  . Chronic kidney disease   . GERD (gastroesophageal reflux disease)   . Carotid artery stenosis   . Coronary artery disease   . Hypothyroidism   . Diabetes mellitus without complication (HCC)   . Depression   . Arthritis   . Kidney failure   . Liver failure (HCC)   . MI (myocardial infarction) (HCC)   . HTN (hypertension)   . Broken leg     R  . CHF (congestive heart failure) (HCC)     Family History  Problem Relation Age of Onset  . Hypertension Father   . Heart attack Mother   . Diabetes Father   . Cancer  Father     Social History   Social History  . Marital Status: Married    Spouse Name: N/A  . Number of Children: N/A  . Years of Education: N/A   Occupational History  . Not on file.   Social History Main Topics  . Smoking status: Never Smoker   . Smokeless tobacco: Never Used  . Alcohol Use: No  . Drug Use: No  . Sexual Activity: Not on file   Other Topics Concern  . Not on file   Social History Narrative    Past Surgical History  Procedure Laterality Date  . Av fistula placement Left   . Cholecystectomy    . Peripheral vascular catheterization N/A 06/11/2014    Procedure: A/V Shuntogram/Fistulagram;  Surgeon: Annice Needy, MD;  Location: [facility] INVASIVE CV [facility];  Service: Cardiovascular;  Laterality: N/A;  . Peripheral vascular catheterization Left 06/11/2014    Procedure: A/V Shunt Intervention;  Surgeon: Annice Needy, MD;  Location: [facility] INV[facility] CV [facility];  Service: Cardiovascular;  Laterality: Left;  . Open reduction internal fixation (orif) distal radial fracture Left 08/09/2014    Procedure: OPEN REDUCTION INTERNAL FIXATION (ORIF) DISTAL RADIAL FRACTURE;  Surgeon: Kennedy Bucker, MD;  Location: [facility] ORS;  Service: Orthopedics;  Laterality: Left;  . Peripheral vascular catheterization Left 11/06/2014    Procedure: A/V Shuntogram/Fistulagram;  Surgeon: Renford Dills, MD;  Location: [facility] INVASIVE CV LAB;  Service: Cardiovascular;  Laterality: Left;  . Peripheral vascular  catheterization Left 11/06/2014    Procedure: A/V Shunt Intervention;  Surgeon: Renford Dills, MD;  Location: [facility] CV [facility];  Service: Cardiovascular;  Laterality: Left;  . Peripheral vascular catheterization Left 01/15/2015    Procedure: A/V Shuntogram/Fistulagram;  Surgeon: Renford Dills, MD;  Location: [facility];  Service: Cardiovascular;  Laterality: Left;  . Peripheral vascular catheterization N/A 01/15/2015    Procedure: A/V Shunt Intervention;  Surgeon: Renford Dills, MD;   Location: [facility] CV LAB;  Service: Cardiovascular;  Laterality: N/A;     Prescriptions prior to admission  Medication Sig Dispense Refill Last Dose  . albuterol (PROVENTIL HFA;VENTOLIN HFA) 108 (90 Base) MCG/ACT inhaler Inhale 2 puffs into the lungs every 4 (four) hours as needed for shortness of breath.   prn at prn  . alendronate (FOSAMAX) 70 MG tablet Take 70 mg by mouth once a week. Take with a full glass of water on an empty stomach. Patient takes on Monday morning   unknown at unknown  . aspirin EC 81 MG tablet Take 81 mg by mouth daily.    unknown at unknown  . benzonatate (TESSALON) 100 MG capsule Take 100 mg by mouth 3 (three) times daily as needed for cough.   prn at prn  . budesonide (PULMICORT) 0.25 MG/2ML nebulizer solution Take 2 mLs (0.25 mg total) by nebulization 2 (two) times daily. 60 mL 12 unknown at unknown  . calcium acetate (PHOSLO) 667 MG capsule Take 667 mg by mouth daily.    unknown at unknown  . Carboxymethylcellul-Glycerin 0.5-0.9 % SOLN Place 1 drop into both eyes 3 (three) times daily.   unknown at unknown  . cetirizine (ZYRTEC) 5 MG tablet Take 5 mg by mouth at bedtime.    unknown at unknown  . clopidogrel (PLAVIX) 75 MG tablet Take 1 tablet (75 mg total) by mouth daily. 30 tablet 0 unknown at unknown  . fluticasone (FLONASE) 50 MCG/ACT nasal spray Place 2 sprays into both nostrils daily.   unknown at unknown  . Fluticasone Furoate-Vilanterol (BREO ELLIPTA) 100-25 MCG/INH AEPB Inhale 1 puff into the lungs daily. 60 each 5 unknown at unknown  . glucose 4 GM chewable tablet Chew 1 tablet by mouth as needed for low blood sugar.   prn at prn  . insulin aspart (NOVOLOG FLEXPEN) 100 UNIT/ML FlexPen Inject 12 Units into the skin 3 (three) times daily with meals.   unknown at unknown  . insulin glargine (LANTUS) 100 UNIT/ML injection Inject 0.45 mLs (45 Units total) into the skin 2 (two) times daily. (Patient taking differently: Inject 42 Units into the skin 2 (two)  times daily. ) 10 mL 11 unknown at unknown  . ipratropium-albuterol (DUONEB) 0.5-2.5 (3) MG/3ML SOLN Take 3 mLs by nebulization every 4 (four) hours as needed. 360 mL 0 prn at prn  . isosorbide mononitrate (IMDUR) 60 MG 24 hr tablet Take 60 mg by mouth daily.    unknown at unknown  . levothyroxine (SYNTHROID, LEVOTHROID) 75 MCG tablet Take 75 mcg by mouth daily.   unknown at unknown  . lidocaine (LIDODERM) 5 % Place 1 patch onto the skin daily. Remove & Discard patch within 12 hours or as directed by MD   unknown at unknown  . loperamide (IMODIUM) 2 MG capsule Take 2-4 mg by mouth as needed for diarrhea or loose stools.    prn at prn  . midodrine (PROAMATINE) 5 MG tablet Take 1 tablet (5 mg total) by mouth 3 (three) times  daily with meals. 90 tablet 1 unknown at unknown  . mineral oil-hydrophilic petrolatum (AQUAPHOR) ointment Apply 1 application topically as needed for dry skin.   prn at prn  . nitroGLYCERIN (NITROSTAT) 0.4 MG SL tablet Place 0.4 mg under the tongue every 5 (five) minutes as needed for chest pain.   prn at prn  . Nutritional Supplements (FEEDING SUPPLEMENT, NEPRO CARB STEADY,) LIQD Take 237 mLs by mouth daily.   unknown at unknown  . pantoprazole (PROTONIX) 40 MG tablet Take 40 mg by mouth 2 (two) times daily.   unknown at unknown  . pregabalin (LYRICA) 75 MG capsule Take 75 mg by mouth at bedtime.   unknown at unknown  . psyllium (METAMUCIL SMOOTH TEXTURE) 28 % packet Take 1 packet by mouth daily.   unknown at unknown  . ranitidine (ZANTAC) 150 MG tablet Take 150 mg by mouth at bedtime.   unknown at unknown  . rOPINIRole (REQUIP) 2 MG tablet Take 2 mg by mouth at bedtime.   unknown at unknown  . traZODone (DESYREL) 50 MG tablet Take 50 mg by mouth at bedtime.   unknown at unknown  . triamcinolone (KENALOG) 0.1 % paste Use as directed 1 application in the mouth or throat at bedtime as needed (gum irritation).   prn at prn  . trolamine salicylate (ASPERCREME) 10 % cream Apply 1  application topically 4 (four) times daily.   prn at prn  . Vaginal Lubricant (REPLENS) GEL Place 1 application vaginally as needed (vaginal dryness).   prn at prn  . venlafaxine XR (EFFEXOR-XR) 37.5 MG 24 hr capsule Take 37.5 mg by mouth at bedtime.   unknown at unknown    Physical Exam: Blood pressure 88/33, pulse 64, temperature 98.3 F (36.8 C), temperature source Oral, resp. rate 18, height 5' (1.524 m), weight 91.808 kg (202 lb 6.4 oz), SpO2 97 %.    General appearance: alert and cooperative Head: Normocephalic, without obvious abnormality, atraumatic Resp: clear to auscultation bilaterally Chest wall: no tenderness Cardio: regular rate and rhythm GI: soft, non-tender; bowel sounds normal; no masses,  no organomegaly Extremities: edema 2+ Pulses: 2+ and symmetric Neurologic: Grossly normal Labs:   Lab Results  Component Value Date   WBC 8.6 03/07/2015   HGB 7.1* 03/07/2015   HCT 23.1* 03/07/2015   MCV 80.8 03/07/2015   PLT 125* 03/07/2015    Recent Labs Lab 03/04/15 1902  03/07/15 0411  NA 136  < > 137  K 4.0  < > 4.5  CL 97*  < > 99*  CO2 26  < > 29  BUN 23*  < > 32*  CREATININE 3.23*  < > 3.96*  CALCIUM 8.4*  < > 8.0*  PROT 6.4*  --   --   BILITOT 0.7  --   --   ALKPHOS 113  --   --   ALT 28  --   --   AST 51*  --   --   GLUCOSE 171*  < > 168*  < > = values in this interval not displayed. Lab Results  Component Value Date   CKTOTAL 502* 10/27/2013   CKMB 0.7 05/15/2011   TROPONINI 1.68* 03/07/2015      Radiology: cardiomegaly with no pulmonary edema EKG: sr with intermitant pvcs. Bigeminy on admission.  ASSESSMENT AND PLAN:  Pt with history of cad s/p pci in the past on asa and plavix readmitted after being noted to have worsening fatigue, hypotension and noted to have pvcs  on admission ekg and tele. Wass being transported to her care facility when noted to have bradycardia. Currentl in nsr at rates of 60. Troponin elevation does not appear to be due to  nstemi but elevated troponin due to renal insuffiency. WIll discontinue heparin as this does not appear to be acs and her hgb is 7.1. Continue with hd for her esrd.  Does not apear to require invasive ardiac evaluation.  Signed: Dalia Heading MD, [facility] 03/07/2015, 4:16 PM

## 2015-03-07 NOTE — Care Management (Signed)
TC to [facility] with an update. Patient waiting to see cardiology. She is tearful and concerned that she can not walk. She is reluctant but states she may be open to SNF through [facility] program. Spoke with PT, she is on the list for evaluation today.

## 2015-03-08 LAB — RENAL FUNCTION PANEL
Albumin: 2.8 g/dL — ABNORMAL LOW (ref 3.5–5.0)
Anion gap: 14 (ref 5–15)
BUN: 50 mg/dL — AB (ref 6–20)
CHLORIDE: 94 mmol/L — AB (ref 101–111)
CO2: 24 mmol/L (ref 22–32)
CREATININE: 5.5 mg/dL — AB (ref 0.44–1.00)
Calcium: 8.2 mg/dL — ABNORMAL LOW (ref 8.9–10.3)
GFR calc Af Amer: 8 mL/min — ABNORMAL LOW (ref >60)
GFR, EST NON AFRICAN AMERICAN: 7 mL/min — AB (ref >60)
GLUCOSE: 270 mg/dL — AB (ref 65–99)
Phosphorus: 5.8 mg/dL — ABNORMAL HIGH (ref 2.5–4.6)
Potassium: 5.1 mmol/L (ref 3.5–5.1)
Sodium: 132 mmol/L — ABNORMAL LOW (ref 135–145)

## 2015-03-08 LAB — CBC
HCT: 24 % — ABNORMAL LOW (ref 35.0–47.0)
Hemoglobin: 7.5 g/dL — ABNORMAL LOW (ref 12.0–16.0)
MCH: 24.6 pg — AB (ref 26.0–34.0)
MCHC: 31.4 g/dL — AB (ref 32.0–36.0)
MCV: 78.4 fL — AB (ref 80.0–100.0)
PLATELETS: 123 10*3/uL — AB (ref 150–440)
RBC: 3.06 MIL/uL — ABNORMAL LOW (ref 3.80–5.20)
RDW: 21.6 % — AB (ref 11.5–14.5)
WBC: 9.5 10*3/uL (ref 3.6–11.0)

## 2015-03-08 LAB — GLUCOSE, CAPILLARY: GLUCOSE-CAPILLARY: 188 mg/dL — AB (ref 65–99)

## 2015-03-08 LAB — PREPARE RBC (CROSSMATCH)

## 2015-03-08 MED ORDER — ACETAMINOPHEN 325 MG PO TABS: 650.0000 mg | ORAL_TABLET | Freq: Four times a day (QID) | ORAL | Status: AC | PRN

## 2015-03-08 MED ORDER — CALCIUM ACETATE 667 MG PO CAPS: 2001.0000 mg | ORAL_CAPSULE | Freq: Every day | ORAL | Status: AC

## 2015-03-08 MED ORDER — EPOETIN ALFA 10000 UNIT/ML IJ SOLN
10000.0000 [IU] | Freq: Once | INTRAMUSCULAR | Status: AC
  Administered 2015-03-08: 10000 [IU] via INTRAVENOUS

## 2015-03-08 NOTE — Progress Notes (Signed)
[facility]  ROUNDING NOTE   Subjective:   Seen and examined on hemodialysis. Tolerating treatment well. 1 unit PRBC transfusion on treatment.  Continues to complain of weakness.  Normal sinus rhythm.   Objective:  Vital signs in last 24 hours:  Temp:  [97.7 F (36.5 C)-98.9 F (37.2 C)] 97.9 F (36.6 C) (02/17 1200) Pulse Rate:  [41-107] 73 (02/17 0818) Resp:  [20-22] 20 (02/17 0342) BP: (95-111)/(37-71) 104/71 mmHg (02/17 1200) SpO2:  [92 %-99 %] 92 % (02/17 0742) Weight:  [92.171 kg (203 lb 3.2 oz)-93.7 kg (206 lb 9.1 oz)] 93.7 kg (206 lb 9.1 oz) (02/17 1105)  Weight change: 2.223 kg (4 lb 14.4 oz) Filed Weights   03/07/15 0500 03/08/15 0342 03/08/15 1105  Weight: 91.808 kg (202 lb 6.4 oz) 92.171 kg (203 lb 3.2 oz) 93.7 kg (206 lb 9.1 oz)    Intake/Output: I/O last 3 completed shifts: In: 360 [P.O.:360] Out: 100 [Urine:100]   Intake/Output this shift:  Total I/O In: 120 [P.O.:120] Out: 50 [Urine:50]  Physical Exam: General: NAD, laying in bed  Head: Normocephalic, atraumatic. Moist oral mucosal membranes  Eyes: Anicteric, PERRL  Neck: Supple, trachea midline  Lungs:  Clear to auscultation  Heart: Regular rate and rhythm  Abdomen:  Soft, nontender,   Extremities: no peripheral edema.  Neurologic: Nonfocal, moving all four extremities  Skin: No lesions  Access: LUE AVF    Basic Metabolic Panel:  Recent Labs Lab 03/04/15 1902 03/06/15 0838 03/06/15 1808 03/07/15 0100 03/07/15 0411  NA 136 134* 137  --  137  K 4.0 5.2* 4.1  --  4.5  CL 97* 97* 97*  --  99*  CO2 --  29  GLUCOSE 171* 108* 179*  --  168*  BUN 23* 52* 25*  --  32*  CREATININE 3.23* 5.48* 3.26* 3.82* 3.96*  CALCIUM 8.4* 8.2* 8.3*  --  8.0*  MG 2.3  --   --   --   --   PHOS  --  8.1*  --   --   --     Liver Function Tests:  Recent Labs Lab 03/04/15 1902 03/06/15 0838  AST 51*  --   ALT 28  --   ALKPHOS 113  --   BILITOT 0.7  --   PROT 6.4*  --    ALBUMIN 3.5 3.2*   No results for input(s): LIPASE, AMYLASE in the last 168 hours. No results for input(s): AMMONIA in the last 168 hours.  CBC:  Recent Labs Lab 03/06/15 0838 03/06/15 1808 03/07/15 0100 03/07/15 0411 03/08/15 0324  WBC 10.3 9.9 9.6 8.6 9.5  NEUTROABS  --  7.2*  --   --   --   HGB 8.1* 7.6* 7.9* 7.1* 7.5*  HCT 26.3* 24.8* 25.8* 23.1* 24.0*  MCV 78.9* 80.0 81.3 80.8 78.4*  PLT 140* 146* 135* 125* 123*    Cardiac Enzymes:  Recent Labs Lab 03/04/15 1902 03/04/15 2100 03/07/15 0100 03/07/15 0411 03/07/15 1031  TROPONINI 0.13* 0.15* 1.62* 1.57* 1.68*    BNP: Invalid input(s): POCBNP  CBG:  Recent Labs Lab 03/07/15 0739 03/07/15 1123 03/07/15 1629 03/07/15 2053 03/08/15 0721  GLUCAP 135* 161* 184* 209* 188*    Microbiology: Results for orders placed or performed during the hospital encounter of 03/04/15  MRSA PCR Screening     Status: None   Collection Time: 03/05/15  8:09 AM  Result Value Ref Range Status   MRSA by PCR  NEGATIVE NEGATIVE Final    Comment:        The GeneXpert MRSA Assay (FDA approved for NASAL specimens only), is one component of a comprehensive MRSA colonization surveillance program. It is not intended to diagnose MRSA infection nor to guide or monitor treatment for MRSA infections.     Coagulation Studies:  Recent Labs  03/07/15 0411  LABPROT 15.4*  INR 1.20    Urinalysis: No results for input(s): COLORURINE, LABSPEC, PHURINE, GLUCOSEU, HGBUR, BILIRUBINUR, KETONESUR, PROTEINUR, UROBILINOGEN, NITRITE, LEUKOCYTESUR in the last 72 hours.  Invalid input(s): APPERANCEUR    Imaging: Dg Chest Portable 1 View  03/06/2015  CLINICAL DATA:  Labored breathing. EXAM: PORTABLE CHEST 1 VIEW COMPARISON:  12/11/2014.  Chest CT from 12/11/2014. FINDINGS: 1923 hours. The cardio pericardial silhouette is enlarged. No pulmonary edema or focal airspace consolidation. No evidence for pleural effusion. Scattered nodular  opacity seen on the previous chest CT are not evident on today's x-ray. The visualized bony structures of the thorax are intact. IMPRESSION: Stable compared to prior chest x-ray. Cardiomegaly without overt pulmonary edema or focal airspace consolidation. Electronically Signed   By: Kennith Center M.D.   On: 03/06/2015 19:39     Medications:     . sodium chloride   Intravenous Once  . aspirin EC  81 mg Oral Daily  . budesonide  0.25 mg Nebulization BID  . calcium acetate  2,001 mg Oral TID WC  . clopidogrel  75 mg Oral Daily  . insulin aspart  0-5 Units Subcutaneous QHS  . insulin aspart  0-9 Units Subcutaneous TID WC  . levothyroxine  75 mcg Oral QAC breakfast  . midodrine  5 mg Oral TID WC  . mometasone-formoterol  2 puff Inhalation BID  . pantoprazole  40 mg Oral BID AC  . pregabalin  75 mg Oral QHS  . rOPINIRole  2 mg Oral QHS  . sodium chloride flush  3 mL Intravenous Q12H  . traZODone  50 mg Oral QHS  . venlafaxine XR  37.5 mg Oral QHS   acetaminophen **OR** acetaminophen, guaiFENesin, ipratropium-albuterol, ondansetron **OR** ondansetron (ZOFRAN) IV  Assessment/ Plan:  Ms. AIZAH GEHLHAUSEN is a 75 y.o. white female  with end-stage renal disease, carotid stenosis, coronary artery disease, diabetes type 2, history of myocardial infarction, hypertension, peripheral vascular disease.  MWF [facility]  1. ESRD on HD WUX:LKGM and examined on hemodialysis treatment.Tolerating treatment well. Continue MWF schedule  2. Anemia of CKD: hemoglobin of 7 - continue Epogen 10,000 units IV with dialysis while inpatient. Gets micera as outpatient.  - 1 unit PRBC with treatment today  3. Secondary Hyperparathyroidism: hyperphosphatemia with phos of 8.1 - calcium acetate with meals  4. Hypotension: and now with bradycardia - started midodrine  tid on last admission - also takes imdur for coronary artery disease   LOS:  Jaymi Tinner 2/17/201712:11 PM

## 2015-03-08 NOTE — Care Management Note (Signed)
Case Management Note  Patient Details  Name: Savannah Key MRN: [id number] Date of Birth: 1940-12-19  Subjective/Objective:    Met with patient at bedside to discuss discharge plan. She is adamant that she does not want to go to SNF.  She wants to go home and be followed up with [facility]. Patient has 2 walkers and [facility] is providing her with a wheelchair.               Action/Plan: Will follow disposition as the day progresses and arrange home care through [place]. She will need a ride home with [facility] at discharge.   Expected Discharge Date:                  Expected Discharge Plan:   ([facility])  In-House Referral:     Discharge planning Services  CM Consult  Post Acute Care Choice:  Long Term Acute Care (LTAC) Choice offered to:  Patient (Refused)  DME Arranged:    DME Agency:     HH Arranged:    [place] Agency:     Status of Service:  In process, will continue to follow  Medicare Important Message Given:    Date Medicare IM Given:    Medicare IM give by:    Date Additional Medicare IM Given:    Additional Medicare Important Message give by:     If discussed at [place] of Stay Meetings, dates discussed:    Additional Comments:  Jolly Mango, RN 03/08/2015, 9:08 AM

## 2015-03-08 NOTE — Progress Notes (Signed)
PT Cancellation Note  Patient Details Name: AYME SHORT MRN: [id number] DOB: 1940/05/07   Cancelled Treatment:    Reason Eval/Treat Not Completed: Patient at procedure or test/unavailable (Patient off unit at dialysis.  Per chart, scheduled for discharge home after treatment this date.  Will continue to follow and re-attempt if patient remains hospitalized.)   Harold Moncus H. Manson Passey, PT, DPT, NCS 03/08/2015, 1:24 PM [phone]

## 2015-03-08 NOTE — Progress Notes (Signed)
Patient received discharge instructions, pt verbalized understanding. IV was removed with no signs of infection. Dressing clean, dry intact. No skin tears or wounds present. Prescription was printed and given to patient. Patient was escorted out with staff member via wheelchair via private auto. Patient discharged with staff from [facility]. No further needs from care management team. Dr. Purcell Mouton is notified of patient's discharge.

## 2015-03-08 NOTE — Progress Notes (Signed)
CSW was informed by RN Case Manager that patient declined SNF placement. She reports that patient is a [facility] patient and [facility] is currently working on her PT Needs. There are no other CSW needs at this time. CSW will be available if a need were to arise. CSW is signing off.   Woodroe Mode, MSW, LCSW-A Clinical Social Work Department [phone]

## 2015-03-08 NOTE — Care Management (Signed)
Met with Dr. Coralie Common and her NP with [facility] in hall. Discussed discharge plan. Updated her on patients decision to go home. [facility] will provide PT services 2 x per week and patient will get dialysis 3 days per week. Will notify [facility] ([facility])  once dialysis is complete as patient will need a ride home. Will follow up.

## 2015-03-08 NOTE — Progress Notes (Signed)
Patient was SOB while dressing and transferring from bed to chair. Check patient's O2 sats, patient at 98% on room air. Notified Dr. Amado Coe, no new orders. Spoke with Dr. Purcell Mouton, Per Dr. Purcell Mouton this is the patient's baseline. Per Dr. Amado Coe okay for patient to discharge home.   Filed Vitals:   03/08/15 1405 03/08/15 1511  BP:  101/58  Pulse:  93  Temp: 98.2 F (36.8 C)   Resp:

## 2015-03-08 NOTE — Progress Notes (Signed)
CSW was informed by RN Case Manager that [facility] will transport patient home. EMS is no longer needed for transport. CSW is signing off. CSW is available if a need were to arise.   Woodroe Mode, MSW, LCSW-A Clinical Social Work Department [phone]

## 2015-03-08 NOTE — Discharge Summary (Signed)
[facility] - [place] at [facility]   PATIENT NAME: Savannah Key    MR#:  [id number]  DATE OF BIRTH:  1940/04/05  DATE OF ADMISSION:  03/06/2015 ADMITTING PHYSICIAN: Oralia Manis, MD  DATE OF DISCHARGE: 03/08/2015 PRIMARY CARE PHYSICIAN: Bobbye Morton, MD    ADMISSION DIAGNOSIS:  Bradycardia [R00.1] SOB (shortness of breath) [R06.02] Ventricular bigeminy [I49.9]  DISCHARGE DIAGNOSIS:  Principal Problem:   Bradycardia Active Problems:   Chronic systolic (congestive) heart failure (HCC)   GERD (gastroesophageal reflux disease)   CAD (coronary artery disease)   Hypothyroidism   Anxiety   Type 2 diabetes mellitus (HCC)   ESRD on dialysis (HCC)   SECONDARY DIAGNOSIS:   Past Medical History  Diagnosis Date  . Chronic kidney disease   . GERD (gastroesophageal reflux disease)   . Carotid artery stenosis   . Coronary artery disease   . Hypothyroidism   . Diabetes mellitus without complication (HCC)   . Depression   . Arthritis   . Kidney failure   . Liver failure (HCC)   . MI (myocardial infarction) (HCC)   . HTN (hypertension)   . Broken leg     R  . CHF (congestive heart failure) (HCC)     HOSPITAL COURSE:   #sinus Bradycardia -  Heart rate is clinically better , bradycardia resolved monitored with telemetry  Evaluated by [facility] cardiology, no interventions recommended at this time   #Chronic systolic (congestive) heart failure (HCC) - does not seem to be in acute exacerbation, continue home meds  # CAD (coronary artery disease) - continue home meds Troponin is elevated but no significant trending-1.62-1.57-1.68, could be from underlying end-stage renal disease On heparin drip, which was discontinued as it is not acute MI Appreciate cardiology recommendations  # Anxiety - home dose anxiolytics when necessary  # Type 2 diabetes mellitus (HCC) - sliding scale insulin with corresponding glucose checks are modified diet  #  ESRD on dialysis [facility]) - nephrology consult for dialysis support-Monday Wednesday and Friday On Epo. Hemoglobin is low at 7.5 today, will transfuse 1 unit of blood today during hemodialysis given the history of coronary artery disease and congestive heart failure. Nephrology is agreeable   #GERD (gastroesophageal reflux disease) - home dose PPI   #Hypothyroidism - home dose thyroid replacement  #Weakness-physical therapy has recommended skilled nursing facility. Patient refused, [facility]. Plan is to  discharge her home after hemodialysis and PRBC transfusion during dialysis DISCHARGE CONDITIONS:    Fair  CONSULTS OBTAINED:  Treatment Team:  Lamont Dowdy, MD Dalia Heading, MD   PROCEDURES none  DRUG ALLERGIES:   Allergies  Allergen Reactions  . Sulfa Antibiotics Other (See Comments)    Reaction:  Blood in the urine     DISCHARGE MEDICATIONS:   Current Discharge Medication List    START taking these medications   Details  acetaminophen (TYLENOL) 325 MG tablet Take 2 tablets (650 mg total) by mouth every 6 (six) hours as needed for mild pain (or Fever >/= 101).      CONTINUE these medications which have CHANGED   Details  calcium acetate (PHOSLO) 667 MG capsule Take 3 capsules (2,001 mg total) by mouth daily. Qty: 30 capsule, Refills: 0      CONTINUE these medications which have NOT CHANGED   Details  albuterol (PROVENTIL HFA;VENTOLIN HFA) 108 (90 Base) MCG/ACT inhaler Inhale 2 puffs into the lungs every 4 (four) hours as needed for  shortness of breath.    alendronate (FOSAMAX) 70 MG tablet Take 70 mg by mouth once a week. Take with a full glass of water on an empty stomach. Patient takes on Monday morning    aspirin EC 81 MG tablet Take 81 mg by mouth daily.     benzonatate (TESSALON) 100 MG capsule Take 100 mg by mouth 3 (three) times daily as needed for cough.    budesonide (PULMICORT) 0.25 MG/2ML nebulizer  solution Take 2 mLs (0.25 mg total) by nebulization 2 (two) times daily. Qty: 60 mL, Refills: 12    Carboxymethylcellul-Glycerin 0.5-0.9 % SOLN Place 1 drop into both eyes 3 (three) times daily.    cetirizine (ZYRTEC) 5 MG tablet Take 5 mg by mouth at bedtime.     clopidogrel (PLAVIX) 75 MG tablet Take 1 tablet (75 mg total) by mouth daily. Qty: 30 tablet, Refills: 0    fluticasone (FLONASE) 50 MCG/ACT nasal spray Place 2 sprays into both nostrils daily.    Fluticasone Furoate-Vilanterol (BREO ELLIPTA) 100-25 MCG/INH AEPB Inhale 1 puff into the lungs daily. Qty: 60 each, Refills: 5    glucose 4 GM chewable tablet Chew 1 tablet by mouth as needed for low blood sugar.    insulin aspart (NOVOLOG FLEXPEN) 100 UNIT/ML FlexPen Inject 12 Units into the skin 3 (three) times daily with meals.    insulin glargine (LANTUS) 100 UNIT/ML injection Inject 0.45 mLs (45 Units total) into the skin 2 (two) times daily. Qty: 10 mL, Refills: 11    ipratropium-albuterol (DUONEB) 0.5-2.5 (3) MG/3ML SOLN Take 3 mLs by nebulization every 4 (four) hours as needed. Qty: 360 mL, Refills: 0    isosorbide mononitrate (IMDUR) 60 MG 24 hr tablet Take 60 mg by mouth daily.     levothyroxine (SYNTHROID, LEVOTHROID) 75 MCG tablet Take 75 mcg by mouth daily.    lidocaine (LIDODERM) 5 % Place 1 patch onto the skin daily. Remove & Discard patch within 12 hours or as directed by MD    loperamide (IMODIUM) 2 MG capsule Take 2-4 mg by mouth as needed for diarrhea or loose stools.     midodrine (PROAMATINE) 5 MG tablet Take 1 tablet (5 mg total) by mouth 3 (three) times daily with meals. Qty: 90 tablet, Refills: 1    mineral oil-hydrophilic petrolatum (AQUAPHOR) ointment Apply 1 application topically as needed for dry skin.    nitroGLYCERIN (NITROSTAT) 0.4 MG SL tablet Place 0.4 mg under the tongue every 5 (five) minutes as needed for chest pain.    Nutritional Supplements (FEEDING SUPPLEMENT, NEPRO CARB STEADY,) LIQD  Take 237 mLs by mouth daily.    pantoprazole (PROTONIX) 40 MG tablet Take 40 mg by mouth 2 (two) times daily.    pregabalin (LYRICA) 75 MG capsule Take 75 mg by mouth at bedtime.    psyllium (METAMUCIL SMOOTH TEXTURE) 28 % packet Take 1 packet by mouth daily.    ranitidine (ZANTAC) 150 MG tablet Take 150 mg by mouth at bedtime.    rOPINIRole (REQUIP) 2 MG tablet Take 2 mg by mouth at bedtime.    traZODone (DESYREL) 50 MG tablet Take 50 mg by mouth at bedtime.    triamcinolone (KENALOG) 0.1 % paste Use as directed 1 application in the mouth or throat at bedtime as needed (gum irritation).    trolamine salicylate (ASPERCREME) 10 % cream Apply 1 application topically 4 (four) times daily.    Vaginal Lubricant (REPLENS) GEL Place 1 application vaginally as needed (vaginal dryness).  venlafaxine XR (EFFEXOR-XR) 37.5 MG 24 hr capsule Take 37.5 mg by mouth at bedtime.         DISCHARGE INSTRUCTIONS:   Activity as tolerated, per  [facility] PT recommendations Diet-heart healthy and diabetic Follow-[facility] with [facility] at [facility] in 3-5 days Follow-up with cardiology-Dr. Lady Gary in 2 weeks Outpatient follow-up with diabetic clinic lifestyle in a week  DIET:  Diabetic diet,cardiac  DISCHARGE CONDITION:  Fair  ACTIVITY:  Activity as tolerated per PT recommendations  OXYGEN:  Home Oxygen: No.   Oxygen Delivery: room air  DISCHARGE LOCATION:  home   If you experience worsening of your admission symptoms, develop shortness of breath, life threatening emergency, suicidal or homicidal thoughts you must seek medical attention immediately by calling 911 or calling your MD immediately  if symptoms less severe.  You Must read complete instructions/literature along with all the possible adverse reactions/side effects for all the Medicines you take and that have been prescribed to you. Take any new Medicines after you have completely understood and accpet all the  possible adverse reactions/side effects.   Please note  You were cared for by a hospitalist during your hospital stay. If you have any questions about your discharge medications or the care you received while you were in the hospital after you are discharged, you can call the unit and asked to speak with the hospitalist on call if the hospitalist that took care of you is not available. Once you are discharged, your primary care physician will handle any further medical issues. Please note that NO REFILLS for any discharge medications will be authorized once you are discharged, as it is imperative that you return to your primary care physician (or establish a relationship with a primary care physician if you do not have one) for your aftercare needs so that they can reassess your need for medications and monitor your lab values.     Today  Chief Complaint  Patient presents with  . Hypotension  . Bradycardia   Patient is feeling fine. Denies any palpitations or chest pain  ROS:  CONSTITUTIONAL: Denies fevers, chills. Denies any fatigue, weakness.  EYES: Denies blurry vision, double vision, eye pain. EARS, NOSE, THROAT: Denies tinnitus, ear pain, hearing loss. RESPIRATORY: Denies cough, wheeze, shortness of breath.  CARDIOVASCULAR: Denies chest pain, palpitations, edema.  GASTROINTESTINAL: Denies nausea, vomiting, diarrhea, abdominal pain. Denies bright red blood per rectum. GENITOURINARY: Denies dysuria, hematuria. ENDOCRINE: Denies nocturia or thyroid problems. HEMATOLOGIC AND LYMPHATIC: Denies easy bruising or bleeding. SKIN: Denies rash or lesion. MUSCULOSKELETAL: Denies pain in neck, back, shoulder, knees, hips or arthritic symptoms.  NEUROLOGIC: Denies paralysis, paresthesias.  PSYCHIATRIC: Denies anxiety or depressive symptoms.   VITAL SIGNS:  Blood pressure 98/45, pulse 73, temperature 97.9 F (36.6 C), temperature source Oral, resp. rate 20, height 5' (1.524 m), weight 93.7  kg (206 lb 9.1 oz), SpO2 92 %.  I/O:    Intake/Output Summary (Last 24 hours) at 03/08/15 1331 Last data filed at 03/08/15 1016  Gross per 24 hour  Intake    240 ml  Output    100 ml  Net    140 ml    PHYSICAL EXAMINATION:  GENERAL:  75 y.o.-year-old patient lying in the bed with no acute distress.  EYES: Pupils equal, round, reactive to light and accommodation. No scleral icterus. Extraocular muscles intact.  HEENT: Head atraumatic, normocephalic. Oropharynx and nasopharynx clear.  NECK:  Supple, no jugular venous distention. No thyroid enlargement, no tenderness.  LUNGS: Normal breath sounds bilaterally, no wheezing, rales,rhonchi or crepitation. No use of accessory muscles of respiration.  CARDIOVASCULAR: S1, S2 normal. No murmurs, rubs, or gallops.  ABDOMEN: Soft, non-tender, non-distended. Bowel sounds present. No organomegaly or mass.  EXTREMITIES: No pedal edema, cyanosis, or clubbing.  NEUROLOGIC: Cranial nerves II through XII are intact. Muscle strength 5/5 in all extremities. Sensation intact. Gait not checked.  PSYCHIATRIC: The patient is alert and oriented x 3.  SKIN: No obvious rash, lesion, or ulcer.   DATA REVIEW:   CBC  Recent Labs Lab 03/08/15 0324  WBC 9.5  HGB 7.5*  HCT 24.0*  PLT 123*    Chemistries   Recent Labs Lab 03/04/15 1902  03/08/15 1100  NA 136  < > 132*  K 4.0  < > 5.1  CL 97*  < > 94*  CO2 26  < > 24  GLUCOSE 171*  < > 270*  BUN 23*  < > 50*  CREATININE 3.23*  < > 5.50*  CALCIUM 8.4*  < > 8.2*  MG 2.3  --   --   AST 51*  --   --   ALT 28  --   --   ALKPHOS 113  --   --   BILITOT 0.7  --   --   < > = values in this interval not displayed.  Cardiac Enzymes  Recent Labs Lab 03/07/15 1031  TROPONINI 1.68*    Microbiology Results  Results for orders placed or performed during the hospital encounter of 03/04/15  MRSA PCR Screening     Status: None   Collection Time: 03/05/15  8:09 AM  Result Value Ref Range Status    MRSA by PCR NEGATIVE NEGATIVE Final    Comment:        The GeneXpert MRSA Assay (FDA approved for NASAL specimens only), is one component of a comprehensive MRSA colonization surveillance program. It is not intended to diagnose MRSA infection nor to guide or monitor treatment for MRSA infections.     RADIOLOGY:  Dg Chest Portable 1 View  03/06/2015  CLINICAL DATA:  Labored breathing. EXAM: PORTABLE CHEST 1 VIEW COMPARISON:  12/11/2014.  Chest CT from 12/11/2014. FINDINGS: 1923 hours. The cardio pericardial silhouette is enlarged. No pulmonary edema or focal airspace consolidation. No evidence for pleural effusion. Scattered nodular opacity seen on the previous chest CT are not evident on today's x-ray. The visualized bony structures of the thorax are intact. IMPRESSION: Stable compared to prior chest x-ray. Cardiomegaly without overt pulmonary edema or focal airspace consolidation. Electronically Signed   By: Kennith Center M.D.   On: 03/06/2015 19:39    EKG:   Orders placed or performed during the hospital encounter of 03/06/15  . EKG 12-Lead  . EKG 12-Lead  . EKG 12-Lead  . EKG 12-Lead      Management plans discussed with the patient, family and they are in agreement.  CODE STATUS:     Code Status Orders        Start     Ordered   03/08/15 0930  Do not attempt resuscitation (DNR)   Continuous    Question Answer Comment  In the event of cardiac or respiratory ARREST Do not call a "code blue"   In the event of cardiac or respiratory ARREST Do not perform Intubation, CPR, defibrillation or ACLS   In the event of cardiac or respiratory ARREST Use medication by any route, position, wound care, and other measures to relive  pain and suffering. May use oxygen, suction and manual treatment of airway obstruction as needed for comfort.   Comments RN may pronounce      03/08/15 0929    Code Status History    Date Active Date Inactive Code Status Order ID Comments User Context    03/07/2015  2:01 AM 03/08/2015  9:28 AM Full Code [id number]  Oralia Manis, MD Inpatient   03/07/2015 12:02 AM 03/07/2015  2:01 AM DNR [id number]  Oralia Manis, MD Inpatient   03/05/2015  1:21 AM 03/06/2015  8:24 PM Full Code [id number]  Arnaldo Natal, MD ED   01/15/2015  1:22 PM 01/15/2015  5:50 PM Full Code [id number]  Renford Dills, MD Inpatient   12/07/2014 10:12 PM 12/19/2014  6:16 PM Full Code [id number]  Wyatt Haste, MD ED   10/01/2014  8:51 AM 10/05/2014  6:57 PM Full Code [id number]  Arnaldo Natal, MD Inpatient   08/09/2014 11:21 AM 08/09/2014  5:05 PM Full Code [id number]  Kennedy Bucker, MD Inpatient      TOTAL TIME TAKING CARE OF THIS PATIENT: 45  minutes.    @[facility] @  on 03/08/2015 at 1:31 PM  Between 7am to 6pm - Pager - [phone]  After 6pm go to [email or web address] - password [email or web address] [facility]  [facility]  Office  [phone]  CC: Primary care physician; Bobbye Morton, MD

## 2015-03-08 NOTE — Progress Notes (Signed)
CSW was consulted by RN Case Manager because patient will need non-emergent EMS for discharge. CSW completed the EMS form and placed it on patient's chart. There are no other CSW needs at this time. CSW is available if a need were to arise.  Woodroe Mode, MSW, LCSW-A Clinical Social Work Department [phone]

## 2015-03-09 LAB — TYPE AND SCREEN
ABO/RH(D): A POS
ANTIBODY SCREEN: NEGATIVE
UNIT DIVISION: 0

## 2015-03-11 ENCOUNTER — Emergency Department: Payer: Medicare (Managed Care)

## 2015-03-11 ENCOUNTER — Inpatient Hospital Stay
Admission: EM | Admit: 2015-03-11 | Discharge: 2015-03-20 | DRG: 291 | Disposition: E | Payer: Medicare (Managed Care) | Attending: Internal Medicine | Admitting: Internal Medicine

## 2015-03-11 DIAGNOSIS — I959 Hypotension, unspecified: Secondary | ICD-10-CM | POA: Diagnosis not present

## 2015-03-11 DIAGNOSIS — I255 Ischemic cardiomyopathy: Secondary | ICD-10-CM | POA: Diagnosis present

## 2015-03-11 DIAGNOSIS — K219 Gastro-esophageal reflux disease without esophagitis: Secondary | ICD-10-CM | POA: Diagnosis present

## 2015-03-11 DIAGNOSIS — R1319 Other dysphagia: Secondary | ICD-10-CM | POA: Diagnosis not present

## 2015-03-11 DIAGNOSIS — Z7982 Long term (current) use of aspirin: Secondary | ICD-10-CM

## 2015-03-11 DIAGNOSIS — Z9119 Patient's noncompliance with other medical treatment and regimen: Secondary | ICD-10-CM

## 2015-03-11 DIAGNOSIS — Z66 Do not resuscitate: Secondary | ICD-10-CM | POA: Diagnosis not present

## 2015-03-11 DIAGNOSIS — Z8249 Family history of ischemic heart disease and other diseases of the circulatory system: Secondary | ICD-10-CM

## 2015-03-11 DIAGNOSIS — I4891 Unspecified atrial fibrillation: Secondary | ICD-10-CM | POA: Diagnosis present

## 2015-03-11 DIAGNOSIS — E1165 Type 2 diabetes mellitus with hyperglycemia: Secondary | ICD-10-CM | POA: Diagnosis present

## 2015-03-11 DIAGNOSIS — I132 Hypertensive heart and chronic kidney disease with heart failure and with stage 5 chronic kidney disease, or end stage renal disease: Principal | ICD-10-CM | POA: Diagnosis present

## 2015-03-11 DIAGNOSIS — I252 Old myocardial infarction: Secondary | ICD-10-CM

## 2015-03-11 DIAGNOSIS — I248 Other forms of acute ischemic heart disease: Secondary | ICD-10-CM | POA: Diagnosis present

## 2015-03-11 DIAGNOSIS — Z452 Encounter for adjustment and management of vascular access device: Secondary | ICD-10-CM

## 2015-03-11 DIAGNOSIS — N186 End stage renal disease: Secondary | ICD-10-CM

## 2015-03-11 DIAGNOSIS — Z79899 Other long term (current) drug therapy: Secondary | ICD-10-CM

## 2015-03-11 DIAGNOSIS — Z7983 Long term (current) use of bisphosphonates: Secondary | ICD-10-CM

## 2015-03-11 DIAGNOSIS — J189 Pneumonia, unspecified organism: Secondary | ICD-10-CM | POA: Diagnosis present

## 2015-03-11 DIAGNOSIS — E8770 Fluid overload, unspecified: Secondary | ICD-10-CM

## 2015-03-11 DIAGNOSIS — I5023 Acute on chronic systolic (congestive) heart failure: Secondary | ICD-10-CM | POA: Diagnosis present

## 2015-03-11 DIAGNOSIS — M545 Low back pain: Secondary | ICD-10-CM | POA: Diagnosis not present

## 2015-03-11 DIAGNOSIS — G4733 Obstructive sleep apnea (adult) (pediatric): Secondary | ICD-10-CM | POA: Diagnosis present

## 2015-03-11 DIAGNOSIS — J9690 Respiratory failure, unspecified, unspecified whether with hypoxia or hypercapnia: Secondary | ICD-10-CM

## 2015-03-11 DIAGNOSIS — Z7952 Long term (current) use of systemic steroids: Secondary | ICD-10-CM

## 2015-03-11 DIAGNOSIS — Z794 Long term (current) use of insulin: Secondary | ICD-10-CM

## 2015-03-11 DIAGNOSIS — I2510 Atherosclerotic heart disease of native coronary artery without angina pectoris: Secondary | ICD-10-CM | POA: Diagnosis present

## 2015-03-11 DIAGNOSIS — Z7902 Long term (current) use of antithrombotics/antiplatelets: Secondary | ICD-10-CM

## 2015-03-11 DIAGNOSIS — D631 Anemia in chronic kidney disease: Secondary | ICD-10-CM | POA: Diagnosis present

## 2015-03-11 DIAGNOSIS — R0989 Other specified symptoms and signs involving the circulatory and respiratory systems: Secondary | ICD-10-CM

## 2015-03-11 DIAGNOSIS — F419 Anxiety disorder, unspecified: Secondary | ICD-10-CM | POA: Diagnosis present

## 2015-03-11 DIAGNOSIS — Z833 Family history of diabetes mellitus: Secondary | ICD-10-CM

## 2015-03-11 DIAGNOSIS — L8990 Pressure ulcer of unspecified site, unspecified stage: Secondary | ICD-10-CM | POA: Insufficient documentation

## 2015-03-11 DIAGNOSIS — Z7951 Long term (current) use of inhaled steroids: Secondary | ICD-10-CM

## 2015-03-11 DIAGNOSIS — E1122 Type 2 diabetes mellitus with diabetic chronic kidney disease: Secondary | ICD-10-CM | POA: Diagnosis present

## 2015-03-11 DIAGNOSIS — D696 Thrombocytopenia, unspecified: Secondary | ICD-10-CM | POA: Diagnosis present

## 2015-03-11 DIAGNOSIS — Z6841 Body Mass Index (BMI) 40.0 and over, adult: Secondary | ICD-10-CM

## 2015-03-11 DIAGNOSIS — E119 Type 2 diabetes mellitus without complications: Secondary | ICD-10-CM

## 2015-03-11 DIAGNOSIS — N2581 Secondary hyperparathyroidism of renal origin: Secondary | ICD-10-CM | POA: Diagnosis present

## 2015-03-11 DIAGNOSIS — I5043 Acute on chronic combined systolic (congestive) and diastolic (congestive) heart failure: Secondary | ICD-10-CM | POA: Diagnosis present

## 2015-03-11 DIAGNOSIS — E039 Hypothyroidism, unspecified: Secondary | ICD-10-CM | POA: Diagnosis present

## 2015-03-11 DIAGNOSIS — E1151 Type 2 diabetes mellitus with diabetic peripheral angiopathy without gangrene: Secondary | ICD-10-CM | POA: Diagnosis present

## 2015-03-11 DIAGNOSIS — Z992 Dependence on renal dialysis: Secondary | ICD-10-CM

## 2015-03-11 DIAGNOSIS — J9601 Acute respiratory failure with hypoxia: Secondary | ICD-10-CM | POA: Diagnosis present

## 2015-03-11 DIAGNOSIS — E669 Obesity, unspecified: Secondary | ICD-10-CM | POA: Diagnosis present

## 2015-03-11 LAB — CBC
HEMATOCRIT: 26.2 % — AB (ref 35.0–47.0)
Hemoglobin: 7.8 g/dL — ABNORMAL LOW (ref 12.0–16.0)
MCH: 24.2 pg — ABNORMAL LOW (ref 26.0–34.0)
MCHC: 30 g/dL — AB (ref 32.0–36.0)
MCV: 80.6 fL (ref 80.0–100.0)
PLATELETS: 76 10*3/uL — AB (ref 150–440)
RBC: 3.24 MIL/uL — ABNORMAL LOW (ref 3.80–5.20)
RDW: 22.7 % — AB (ref 11.5–14.5)
WBC: 7 10*3/uL (ref 3.6–11.0)

## 2015-03-11 LAB — BASIC METABOLIC PANEL
Anion gap: 10 (ref 5–15)
BUN: 19 mg/dL (ref 6–20)
CO2: 29 mmol/L (ref 22–32)
CREATININE: 3.28 mg/dL — AB (ref 0.44–1.00)
Calcium: 7.9 mg/dL — ABNORMAL LOW (ref 8.9–10.3)
Chloride: 97 mmol/L — ABNORMAL LOW (ref 101–111)
GFR calc Af Amer: 15 mL/min — ABNORMAL LOW (ref >60)
GFR calc non Af Amer: 13 mL/min — ABNORMAL LOW (ref >60)
GLUCOSE: 142 mg/dL — AB (ref 65–99)
POTASSIUM: 3.8 mmol/L (ref 3.5–5.1)
SODIUM: 136 mmol/L (ref 135–145)

## 2015-03-11 LAB — BRAIN NATRIURETIC PEPTIDE: B NATRIURETIC PEPTIDE 5: 3527 pg/mL — AB (ref 0.0–100.0)

## 2015-03-11 LAB — TROPONIN I: Troponin I: 0.99 ng/mL — ABNORMAL HIGH (ref <0.031)

## 2015-03-11 MED ORDER — MORPHINE SULFATE (PF) 2 MG/ML IV SOLN
2.0000 mg | Freq: Once | INTRAVENOUS | Status: AC
  Administered 2015-03-11: 2 mg via INTRAVENOUS
  Filled 2015-03-11: qty 1

## 2015-03-11 MED ORDER — IPRATROPIUM-ALBUTEROL 0.5-2.5 (3) MG/3ML IN SOLN
3.0000 mL | Freq: Once | RESPIRATORY_TRACT | Status: AC
  Administered 2015-03-11: 3 mL via RESPIRATORY_TRACT
  Filled 2015-03-11: qty 3

## 2015-03-11 MED ORDER — MIDODRINE HCL 5 MG PO TABS
5.0000 mg | ORAL_TABLET | ORAL | Status: AC
  Administered 2015-03-11: 5 mg via ORAL
  Filled 2015-03-11: qty 1

## 2015-03-11 MED ORDER — MORPHINE SULFATE (PF) 2 MG/ML IV SOLN: 2.0000 mg | Freq: Once | INTRAVENOUS | Status: DC

## 2015-03-11 NOTE — ED Notes (Signed)
Pt gives permission to call daughter, Guss Bunde [phone] or [phone] if patients condition changes.

## 2015-03-11 NOTE — ED Notes (Signed)
Spoke to Dr. Fanny Bien regarding pt's fluctuating BP and heart rhythm. MD reassessed patient. Per pt, she received dialysis this morning and her usual BP afterwards is low. Pt alert and communicative at present. Will continue to monitor.

## 2015-03-11 NOTE — Progress Notes (Signed)
   03/19/2015 2051  Clinical Encounter Type  Visited With Patient and family together;Family  Visit Type Initial  Referral From Nurse  Consult/Referral To Chaplain  Stress Factors  Family Stress Factors Loss of control  Chaplain visited with patient and patients daughter. Daughter had questions about a DNR her mother completed without her knowledge. I had a nurse and doctor requested to come in and further explain what a DNR entailed. Daughter very emotional about recent turn of events and new information about mothers health. I provided a compassionate presence. Patient seems at peace with her decision of getting a DNR and with current hospital stay.   Chaplain Welma Mccombs Ext: [phone]

## 2015-03-11 NOTE — ED Notes (Signed)
Pt comes into [facility] with c/o increased weakness and SOB with cough.. States she is having upper back pain with the cough was just discharged from here on 2/15 with similar sx.Savannah Key

## 2015-03-11 NOTE — ED Provider Notes (Addendum)
[facility] Emergency Department Provider Note  ____________________________________________  Time seen: Approximately 7:00 PM  I have reviewed the triage vital signs and the nursing notes.   HISTORY  Chief Complaint Weakness; Cough; and Shortness of Breath    HPI Savannah Key is a 75 y.o. female history multiple medical problems: Coronary disease, chronic kidney disease on hemodialysis. Patient had dialysis today, and afterwards saw her primary doctor who noted that she continues to have dyspnea, increased work of breathing was concerned about the possibility of pneumonia versus significant volume overload. She has gained approximately 10 pounds over the weekend, but reports limiting intake. She continues to report feeling just mildly short of breath at almost all times. She denies chest pain, she has pain in her lower back which she reports to laying still without any recent falls. She does occasionally produce small amounts of urine. No recent fever but does report she had pneumonia which was treated about 2-3 weeks ago and improved slowly.   Past Medical History  Diagnosis Date  . Chronic kidney disease   . GERD (gastroesophageal reflux disease)   . Carotid artery stenosis   . Coronary artery disease   . Hypothyroidism   . Diabetes mellitus without complication (HCC)   . Depression   . Arthritis   . Kidney failure   . Liver failure (HCC)   . MI (myocardial infarction) (HCC)   . HTN (hypertension)   . Broken leg     R  . CHF (congestive heart failure) [facility])     Patient Active Problem List   Diagnosis Date Noted  . Bradycardia 03/06/2015  . GERD (gastroesophageal reflux disease) 03/06/2015  . CAD (coronary artery disease) 03/06/2015  . Hypothyroidism 03/06/2015  . Anxiety 03/06/2015  . Type 2 diabetes mellitus (HCC) 03/06/2015  . ESRD on dialysis (HCC) 03/06/2015  . Generalized weakness 03/04/2015  . Chronic systolic (congestive) heart  failure (HCC) 12/07/2014    Past Surgical History  Procedure Laterality Date  . Av fistula placement Left   . Cholecystectomy    . Peripheral vascular catheterization N/A 06/11/2014    Procedure: A/V Shuntogram/Fistulagram;  Surgeon: Annice Needy, MD;  Location: [facility] INVASIVE CV LAB;  Service: Cardiovascular;  Laterality: N/A;  . Peripheral vascular catheterization Left 06/11/2014    Procedure: A/V Shunt Intervention;  Surgeon: Annice Needy, MD;  Location: [facility] INVASIVE CV LAB;  Service: Cardiovascular;  Laterality: Left;  . Open reduction internal fixation (orif) distal radial fracture Left 08/09/2014    Procedure: OPEN REDUCTION INTERNAL FIXATION (ORIF) DISTAL RADIAL FRACTURE;  Surgeon: Kennedy Bucker, MD;  Location: [facility] ORS;  Service: Orthopedics;  Laterality: Left;  . Peripheral vascular catheterization Left 11/06/2014    Procedure: A/V Shuntogram/Fistulagram;  Surgeon: Renford Dills, MD;  Location: [facility] INVASIVE CV LAB;  Service: Cardiovascular;  Laterality: Left;  . Peripheral vascular catheterization Left 11/06/2014    Procedure: A/V Shunt Intervention;  Surgeon: Renford Dills, MD;  Location: [facility] INVASIVE CV LAB;  Service: Cardiovascular;  Laterality: Left;  . Peripheral vascular catheterization Left 01/15/2015    Procedure: A/V Shuntogram/Fistulagram;  Surgeon: Renford Dills, MD;  Location: [facility] INVASIVE CV LAB;  Service: Cardiovascular;  Laterality: Left;  . Peripheral vascular catheterization N/A 01/15/2015    Procedure: A/V Shunt Intervention;  Surgeon: Renford Dills, MD;  Location: [facility] INVASIVE CV LAB;  Service: Cardiovascular;  Laterality: N/A;    Current Outpatient Rx  Name  Route  Sig  Dispense  Refill  .  acetaminophen (TYLENOL) 325 MG tablet   Oral   Take 2 tablets (650 mg total) by mouth every 6 (six) hours as needed for mild pain (or Fever >/= 101).         Marland Kitchen albuterol (PROVENTIL HFA;VENTOLIN HFA) 108 (90 Base) MCG/ACT inhaler   Inhalation   Inhale 2  puffs into the lungs every 4 (four) hours as needed for shortness of breath.         Marland Kitchen alendronate (FOSAMAX) 70 MG tablet   Oral   Take 70 mg by mouth once a week. Take with a full glass of water on an empty stomach. Patient takes on Monday morning         . aspirin EC 81 MG tablet   Oral   Take 81 mg by mouth daily.          . benzonatate (TESSALON) 100 MG capsule   Oral   Take 100 mg by mouth 3 (three) times daily as needed for cough.         . budesonide (PULMICORT) 0.25 MG/2ML nebulizer solution   Nebulization   Take 2 mLs (0.25 mg total) by nebulization 2 (two) times daily.   60 mL   12   . calcium acetate (PHOSLO) 667 MG capsule   Oral   Take 3 capsules (2,001 mg total) by mouth daily.   30 capsule   0   . Carboxymethylcellul-Glycerin 0.5-0.9 % SOLN   Both Eyes   Place 1 drop into both eyes 3 (three) times daily.         . cetirizine (ZYRTEC) 5 MG tablet   Oral   Take 5 mg by mouth at bedtime.          . clopidogrel (PLAVIX) 75 MG tablet   Oral   Take 1 tablet (75 mg total) by mouth daily.   30 tablet   0   . fluticasone (FLONASE) 50 MCG/ACT nasal spray   Each Nare   Place 2 sprays into both nostrils daily.         . Fluticasone Furoate-Vilanterol (BREO ELLIPTA) 100-25 MCG/INH AEPB   Inhalation   Inhale 1 puff into the lungs daily.   60 each   5   . glucose 4 GM chewable tablet   Oral   Chew 1 tablet by mouth as needed for low blood sugar.         . insulin aspart (NOVOLOG FLEXPEN) 100 UNIT/ML FlexPen   Subcutaneous   Inject 12 Units into the skin 3 (three) times daily with meals.         . insulin glargine (LANTUS) 100 UNIT/ML injection   Subcutaneous   Inject 0.45 mLs (45 Units total) into the skin 2 (two) times daily. Patient taking differently: Inject 42 Units into the skin 2 (two) times daily.    10 mL   11   . ipratropium-albuterol (DUONEB) 0.5-2.5 (3) MG/3ML SOLN   Nebulization   Take 3 mLs by nebulization every 4  (four) hours as needed.   360 mL   0   . isosorbide mononitrate (IMDUR) 60 MG 24 hr tablet   Oral   Take 60 mg by mouth daily.          Marland Kitchen levothyroxine (SYNTHROID, LEVOTHROID) 75 MCG tablet   Oral   Take 75 mcg by mouth daily.         Marland Kitchen lidocaine (LIDODERM) 5 %   Transdermal   Place 1 patch onto the skin  daily. Remove & Discard patch within 12 hours or as directed by MD         . loperamide (IMODIUM) 2 MG capsule   Oral   Take 2-4 mg by mouth as needed for diarrhea or loose stools.          . midodrine (PROAMATINE) 5 MG tablet   Oral   Take 1 tablet (5 mg total) by mouth 3 (three) times daily with meals.   90 tablet   1   . mineral oil-hydrophilic petrolatum (AQUAPHOR) ointment   Topical   Apply 1 application topically as needed for dry skin.         Marland Kitchen nitroGLYCERIN (NITROSTAT) 0.4 MG SL tablet   Sublingual   Place 0.4 mg under the tongue every 5 (five) minutes as needed for chest pain.         . Nutritional Supplements (FEEDING SUPPLEMENT, NEPRO CARB STEADY,) LIQD   Oral   Take 237 mLs by mouth daily.         . pantoprazole (PROTONIX) 40 MG tablet   Oral   Take 40 mg by mouth 2 (two) times daily.         . pregabalin (LYRICA) 75 MG capsule   Oral   Take 75 mg by mouth at bedtime.         . psyllium (METAMUCIL SMOOTH TEXTURE) 28 % packet   Oral   Take 1 packet by mouth daily.         . ranitidine (ZANTAC) 150 MG tablet   Oral   Take 150 mg by mouth at bedtime.         Marland Kitchen rOPINIRole (REQUIP) 2 MG tablet   Oral   Take 2 mg by mouth at bedtime.         . traZODone (DESYREL) 50 MG tablet   Oral   Take 50 mg by mouth at bedtime.         . triamcinolone (KENALOG) 0.1 % paste   Mouth/Throat   Use as directed 1 application in the mouth or throat at bedtime as needed (gum irritation).         . trolamine salicylate (ASPERCREME) 10 % cream   Topical   Apply 1 application topically 4 (four) times daily.         . Vaginal  Lubricant (REPLENS) GEL   Vaginal   Place 1 application vaginally as needed (vaginal dryness).         . venlafaxine XR (EFFEXOR-XR) 37.5 MG 24 hr capsule   Oral   Take 37.5 mg by mouth at bedtime.           Allergies Sulfa antibiotics  Family History  Problem Relation Age of Onset  . Hypertension Father   . Heart attack Mother   . Diabetes Father   . Cancer Father     Social History Social History  Substance Use Topics  . Smoking status: Never Smoker   . Smokeless tobacco: Never Used  . Alcohol Use: No    Review of Systems Constitutional: No fever/chills Eyes: No visual changes. ENT: No sore throat. Cardiovascular: Denies chest pain. Respiratory: See history of present illness Gastrointestinal: No abdominal pain.  No nausea, no vomiting.  No diarrhea.  No constipation. Genitourinary: Mild back pain, feels like she laying wrong. Musculoskeletal: Negative for back pain. Skin: Negative for rash. Neurological: Negative for headaches, focal weakness or numbness.  10-point ROS otherwise negative.  ____________________________________________   PHYSICAL EXAM:  VITAL SIGNS: ED Triage Vitals  Enc Vitals Group     BP 04-03-15 1710 112/39 mmHg     Pulse Rate 2015/04/03 1710 68     Resp 04-03-2015 1710 24     Temp 2015/04/03 1716 98.1 F (36.7 C)     Temp Source 2015-04-03 1710 Oral     SpO2 April 03, 2015 1710 97 %     Weight 04-03-15 1710 198 lb (89.812 kg)     Height Apr 03, 2015 1710 5' (1.524 m)     Head Cir --      Peak Flow --      Pain Score 04-03-2015 1711 9     Pain Loc --      Pain Edu? --      Excl. in GC? --    Constitutional: Alert and oriented. Chronically ill appearing, mild increased work of breathing with mild tachypnea.. Eyes: Conjunctivae are normal. PERRL. EOMI. Head: Atraumatic. Nose: No congestion/rhinnorhea. Mouth/Throat: Mucous membranes are moist.  Oropharynx non-erythematous. Neck: No stridor.   Cardiovascular: Normal rate, irregular rhythm.  Grossly normal heart sounds.  Good peripheral circulation. Distant. Respiratory: Mild tachypnea, mild use of accessory muscles. Mild and expiratory wheezing without rales throughout. Speaks in phrases. Gastrointestinal: Soft and nontender. No distention. No abdominal bruits. No CVA tenderness. Musculoskeletal: 3+ pitting lower extremity edema. No joint effusions. Neurologic:  Normal speech and language. No gross focal neurologic deficits are appreciated. Generalized weakness. No focal findings noted. No facial droop. no numbness or tingling in the extremities grossly. Skin:  Skin is warm, dry and intact. No rash noted. Psychiatric: Mood and affect are normal. Speech and behavior are normal.  ____________________________________________   LABS (all labs ordered are listed, but only abnormal results are displayed)  Labs Reviewed  BASIC METABOLIC PANEL - Abnormal; Notable for the following:    Chloride 97 (*)    Glucose, Bld 142 (*)    Creatinine, Ser 3.28 (*)    Calcium 7.9 (*)    GFR calc non Af Amer 13 (*)    GFR calc Af Amer 15 (*)    All other components within normal limits  CBC - Abnormal; Notable for the following:    RBC 3.24 (*)    Hemoglobin 7.8 (*)    HCT 26.2 (*)    MCH 24.2 (*)    MCHC 30.0 (*)    RDW 22.7 (*)    Platelets 76 (*)    All other components within normal limits  BRAIN NATRIURETIC PEPTIDE - Abnormal; Notable for the following:    B Natriuretic Peptide 3527.0 (*)    All other components within normal limits  URINALYSIS COMPLETEWITH MICROSCOPIC ([facility] ONLY)  TROPONIN I  CBG MONITORING, ED   ____________________________________________  EKG  The interim at 1715 Ventricular rate 90 PR 110 QTc 480 Irregular rhythm, seemingly occurring in triplets with multiple different QRS complexes No obvious P waves are noted Occasional PVC. No clear evidence of acute ischemic abnormalities or ST  elevation.  ____________________________________________  RADIOLOGY  DG Chest Port 1 View (Final result) Result time: 2015-04-03 19:33:53   Final result by Rad Results In Interface (04/03/15 19:33:53)   Narrative:   CLINICAL DATA: Increased weakness and shortness of breath with cough. Upper back pain.  EXAM: PORTABLE CHEST 1 VIEW  COMPARISON: 03/06/2015  FINDINGS: 1900 hours The lungs are clear wiithout focal pneumonia, edema, pneumothorax or pleural effusion. There is pulmonary vascular congestion without overt pulmonary edema. The cardio pericardial silhouette is enlarged. The visualized bony structures  of the thorax are intact.  IMPRESSION: Stable. Enlargement of the cardiopericardial silhouette with vascular congestion.   Electronically Signed By: Kennith Center M.D. On: April 06, 2015 19:33    ____________________________________________   PROCEDURES  Procedure(s) performed: None  Critical Care performed: No  ____________________________________________   INITIAL IMPRESSION / ASSESSMENT AND PLAN / ED COURSE  Pertinent labs & imaging results that were available during my care of the patient were reviewed by me and considered in my medical decision making (see chart for details).  Patient presents for ongoing dyspnea. Seemingly slowly worsening since hospitalization. Seen by her primary care physician today who advised need for admission, x-ray, and likely concerns for volume overload. She does report about 10 pounds weight gain over the last 2 days, she does have mild dyspnea, increased work of breathing. Exam is consistent with volume overload with lower extremity edema, vascular congestion on chest x-ray and significant only elevated BNP.  No white count, afebrile, no evidence of clear consolidation or infiltrate suggest pneumonia. Most suspect the patient is likely suffering from ongoing volume overload. Discussed with Dr. Thedore Mins of nephrology, and advises  with the patient's weight gain and dyspnea at this time. Recommend admission, evaluation by nephrology and likely a more aggressive hemodialysis regimen for the short-term including probable dialysis tomorrow.  Patient family agreeable. She does not currently have an oxygen requirement, does have increased rate of breathing, speaks in short phrases, and does demonstrate vascular congestion elevated BNP/weight gain. ____________________________________________   FINAL CLINICAL IMPRESSION(S) / ED DIAGNOSES  Final diagnoses:  Hypervolemia, unspecified hypervolemia type  Pulmonary vascular congestion  End stage renal disease on dialysis [facility])      Sharyn Creamer, MD April 06, 2015 2010  Patient's troponin elevated, appears to be a chronic finding. No chest pain, down trending.  Sharyn Creamer, MD 04-06-15 [phone]

## 2015-03-12 DIAGNOSIS — J9601 Acute respiratory failure with hypoxia: Secondary | ICD-10-CM | POA: Diagnosis present

## 2015-03-12 DIAGNOSIS — J189 Pneumonia, unspecified organism: Secondary | ICD-10-CM | POA: Diagnosis present

## 2015-03-12 DIAGNOSIS — F419 Anxiety disorder, unspecified: Secondary | ICD-10-CM | POA: Diagnosis present

## 2015-03-12 DIAGNOSIS — I959 Hypotension, unspecified: Secondary | ICD-10-CM | POA: Diagnosis not present

## 2015-03-12 DIAGNOSIS — N2581 Secondary hyperparathyroidism of renal origin: Secondary | ICD-10-CM | POA: Diagnosis present

## 2015-03-12 DIAGNOSIS — I5023 Acute on chronic systolic (congestive) heart failure: Secondary | ICD-10-CM | POA: Diagnosis not present

## 2015-03-12 DIAGNOSIS — I248 Other forms of acute ischemic heart disease: Secondary | ICD-10-CM | POA: Diagnosis present

## 2015-03-12 DIAGNOSIS — Z7983 Long term (current) use of bisphosphonates: Secondary | ICD-10-CM | POA: Diagnosis not present

## 2015-03-12 DIAGNOSIS — I252 Old myocardial infarction: Secondary | ICD-10-CM | POA: Diagnosis not present

## 2015-03-12 DIAGNOSIS — E1165 Type 2 diabetes mellitus with hyperglycemia: Secondary | ICD-10-CM | POA: Diagnosis present

## 2015-03-12 DIAGNOSIS — Z7982 Long term (current) use of aspirin: Secondary | ICD-10-CM | POA: Diagnosis not present

## 2015-03-12 DIAGNOSIS — M545 Low back pain: Secondary | ICD-10-CM | POA: Diagnosis not present

## 2015-03-12 DIAGNOSIS — Z794 Long term (current) use of insulin: Secondary | ICD-10-CM | POA: Diagnosis not present

## 2015-03-12 DIAGNOSIS — T8059XA Anaphylactic reaction due to other serum, initial encounter: Secondary | ICD-10-CM | POA: Diagnosis not present

## 2015-03-12 DIAGNOSIS — I132 Hypertensive heart and chronic kidney disease with heart failure and with stage 5 chronic kidney disease, or end stage renal disease: Secondary | ICD-10-CM | POA: Diagnosis present

## 2015-03-12 DIAGNOSIS — G4733 Obstructive sleep apnea (adult) (pediatric): Secondary | ICD-10-CM | POA: Diagnosis present

## 2015-03-12 DIAGNOSIS — Z9119 Patient's noncompliance with other medical treatment and regimen: Secondary | ICD-10-CM | POA: Diagnosis not present

## 2015-03-12 DIAGNOSIS — Z66 Do not resuscitate: Secondary | ICD-10-CM | POA: Diagnosis not present

## 2015-03-12 DIAGNOSIS — J810 Acute pulmonary edema: Secondary | ICD-10-CM | POA: Diagnosis not present

## 2015-03-12 DIAGNOSIS — I4891 Unspecified atrial fibrillation: Secondary | ICD-10-CM | POA: Diagnosis present

## 2015-03-12 DIAGNOSIS — E1122 Type 2 diabetes mellitus with diabetic chronic kidney disease: Secondary | ICD-10-CM | POA: Diagnosis present

## 2015-03-12 DIAGNOSIS — R1319 Other dysphagia: Secondary | ICD-10-CM | POA: Diagnosis not present

## 2015-03-12 DIAGNOSIS — I5043 Acute on chronic combined systolic (congestive) and diastolic (congestive) heart failure: Secondary | ICD-10-CM | POA: Diagnosis present

## 2015-03-12 DIAGNOSIS — E669 Obesity, unspecified: Secondary | ICD-10-CM | POA: Diagnosis present

## 2015-03-12 DIAGNOSIS — D631 Anemia in chronic kidney disease: Secondary | ICD-10-CM | POA: Diagnosis present

## 2015-03-12 DIAGNOSIS — Z79899 Other long term (current) drug therapy: Secondary | ICD-10-CM | POA: Diagnosis not present

## 2015-03-12 DIAGNOSIS — Z992 Dependence on renal dialysis: Secondary | ICD-10-CM | POA: Diagnosis not present

## 2015-03-12 DIAGNOSIS — Z8249 Family history of ischemic heart disease and other diseases of the circulatory system: Secondary | ICD-10-CM | POA: Diagnosis not present

## 2015-03-12 DIAGNOSIS — Z7952 Long term (current) use of systemic steroids: Secondary | ICD-10-CM | POA: Diagnosis not present

## 2015-03-12 DIAGNOSIS — N186 End stage renal disease: Secondary | ICD-10-CM | POA: Diagnosis present

## 2015-03-12 DIAGNOSIS — E1151 Type 2 diabetes mellitus with diabetic peripheral angiopathy without gangrene: Secondary | ICD-10-CM | POA: Diagnosis present

## 2015-03-12 DIAGNOSIS — Z6841 Body Mass Index (BMI) 40.0 and over, adult: Secondary | ICD-10-CM | POA: Diagnosis not present

## 2015-03-12 DIAGNOSIS — I255 Ischemic cardiomyopathy: Secondary | ICD-10-CM | POA: Diagnosis present

## 2015-03-12 DIAGNOSIS — I2510 Atherosclerotic heart disease of native coronary artery without angina pectoris: Secondary | ICD-10-CM | POA: Diagnosis present

## 2015-03-12 DIAGNOSIS — Z833 Family history of diabetes mellitus: Secondary | ICD-10-CM | POA: Diagnosis not present

## 2015-03-12 DIAGNOSIS — Z7951 Long term (current) use of inhaled steroids: Secondary | ICD-10-CM | POA: Diagnosis not present

## 2015-03-12 DIAGNOSIS — E039 Hypothyroidism, unspecified: Secondary | ICD-10-CM | POA: Diagnosis present

## 2015-03-12 DIAGNOSIS — K219 Gastro-esophageal reflux disease without esophagitis: Secondary | ICD-10-CM | POA: Diagnosis present

## 2015-03-12 DIAGNOSIS — D696 Thrombocytopenia, unspecified: Secondary | ICD-10-CM | POA: Diagnosis present

## 2015-03-12 DIAGNOSIS — Z7902 Long term (current) use of antithrombotics/antiplatelets: Secondary | ICD-10-CM | POA: Diagnosis not present

## 2015-03-12 LAB — GLUCOSE, CAPILLARY
GLUCOSE-CAPILLARY: 237 mg/dL — AB (ref 65–99)
GLUCOSE-CAPILLARY: 39 mg/dL — AB (ref 65–99)
GLUCOSE-CAPILLARY: 56 mg/dL — AB (ref 65–99)
GLUCOSE-CAPILLARY: 156 mg/dL — AB (ref 65–99)
GLUCOSE-CAPILLARY: 144 mg/dL — AB (ref 65–99)
Glucose-Capillary: 47 mg/dL — ABNORMAL LOW (ref 65–99)
Glucose-Capillary: 99 mg/dL (ref 65–99)

## 2015-03-12 LAB — CBC
HEMATOCRIT: 26.1 % — AB (ref 35.0–47.0)
HEMOGLOBIN: 8 g/dL — AB (ref 12.0–16.0)
MCH: 25 pg — AB (ref 26.0–34.0)
MCHC: 30.7 g/dL — AB (ref 32.0–36.0)
MCV: 81.5 fL (ref 80.0–100.0)
Platelets: 77 10*3/uL — ABNORMAL LOW (ref 150–440)
RBC: 3.21 MIL/uL — ABNORMAL LOW (ref 3.80–5.20)
RDW: 22.2 % — AB (ref 11.5–14.5)
WBC: 7.6 10*3/uL (ref 3.6–11.0)

## 2015-03-12 LAB — TROPONIN I
TROPONIN I: 1.29 ng/mL — AB (ref <0.031)
TROPONIN I: 1.77 ng/mL — AB (ref <0.031)
Troponin I: 1.67 ng/mL — ABNORMAL HIGH (ref <0.031)

## 2015-03-12 LAB — BASIC METABOLIC PANEL
Anion gap: 10 (ref 5–15)
BUN: 24 mg/dL — AB (ref 6–20)
CO2: 27 mmol/L (ref 22–32)
Calcium: 7.9 mg/dL — ABNORMAL LOW (ref 8.9–10.3)
Chloride: 98 mmol/L — ABNORMAL LOW (ref 101–111)
Creatinine, Ser: 3.9 mg/dL — ABNORMAL HIGH (ref 0.44–1.00)
GFR calc Af Amer: 12 mL/min — ABNORMAL LOW (ref >60)
GFR, EST NON AFRICAN AMERICAN: 11 mL/min — AB (ref >60)
GLUCOSE: 213 mg/dL — AB (ref 65–99)
POTASSIUM: 4 mmol/L (ref 3.5–5.1)
Sodium: 135 mmol/L (ref 135–145)

## 2015-03-12 MED ORDER — DEXTROSE 50 % IV SOLN
INTRAVENOUS | Status: AC
  Administered 2015-03-12: 50 mL
  Filled 2015-03-12: qty 50

## 2015-03-12 MED ORDER — SODIUM CHLORIDE 0.9% FLUSH
3.0000 mL | Freq: Two times a day (BID) | INTRAVENOUS | Status: DC
  Administered 2015-03-12 – 2015-03-15 (×8): 3 mL via INTRAVENOUS

## 2015-03-12 MED ORDER — CETYLPYRIDINIUM CHLORIDE 0.05 % MT LIQD
7.0000 mL | Freq: Two times a day (BID) | OROMUCOSAL | Status: DC
  Administered 2015-03-12 – 2015-03-15 (×4): 7 mL via OROMUCOSAL

## 2015-03-12 MED ORDER — CLOPIDOGREL BISULFATE 75 MG PO TABS
75.0000 mg | ORAL_TABLET | Freq: Every day | ORAL | Status: DC
  Administered 2015-03-12 – 2015-03-15 (×4): 75 mg via ORAL
  Filled 2015-03-12 (×4): qty 1

## 2015-03-12 MED ORDER — ONDANSETRON HCL 4 MG PO TABS: 4.0000 mg | ORAL_TABLET | Freq: Four times a day (QID) | ORAL | Status: DC | PRN

## 2015-03-12 MED ORDER — GUAIFENESIN-DM 100-10 MG/5ML PO SYRP
5.0000 mL | ORAL_SOLUTION | ORAL | Status: DC | PRN
  Administered 2015-03-12 – 2015-03-13 (×2): 5 mL via ORAL
  Filled 2015-03-12 (×2): qty 5

## 2015-03-12 MED ORDER — TRAZODONE HCL 50 MG PO TABS
50.0000 mg | ORAL_TABLET | Freq: Every day | ORAL | Status: DC
  Administered 2015-03-12 – 2015-03-14 (×3): 50 mg via ORAL
  Filled 2015-03-12 (×5): qty 1

## 2015-03-12 MED ORDER — HYDROCODONE-ACETAMINOPHEN 5-325 MG PO TABS
1.0000 | ORAL_TABLET | ORAL | Status: DC | PRN
Start: 2015-03-12 — End: 2015-03-16
  Administered 2015-03-13 – 2015-03-14 (×2): 1 via ORAL
  Filled 2015-03-12 (×3): qty 1

## 2015-03-12 MED ORDER — LEVOTHYROXINE SODIUM 75 MCG PO TABS
75.0000 ug | ORAL_TABLET | Freq: Every day | ORAL | Status: DC
  Administered 2015-03-12 – 2015-03-15 (×4): 75 ug via ORAL
  Filled 2015-03-12 (×4): qty 1

## 2015-03-12 MED ORDER — MOMETASONE FURO-FORMOTEROL FUM 100-5 MCG/ACT IN AERO
2.0000 | INHALATION_SPRAY | Freq: Two times a day (BID) | RESPIRATORY_TRACT | Status: DC
  Administered 2015-03-12 – 2015-03-15 (×7): 2 via RESPIRATORY_TRACT
  Filled 2015-03-12: qty 8.8

## 2015-03-12 MED ORDER — PREGABALIN 50 MG PO CAPS
75.0000 mg | ORAL_CAPSULE | Freq: Every day | ORAL | Status: DC
  Administered 2015-03-12 – 2015-03-14 (×3): 75 mg via ORAL
  Filled 2015-03-12 (×3): qty 1

## 2015-03-12 MED ORDER — IPRATROPIUM-ALBUTEROL 0.5-2.5 (3) MG/3ML IN SOLN
3.0000 mL | RESPIRATORY_TRACT | Status: DC
  Administered 2015-03-12 – 2015-03-15 (×18): 3 mL via RESPIRATORY_TRACT
  Filled 2015-03-12 (×19): qty 3

## 2015-03-12 MED ORDER — INSULIN ASPART 100 UNIT/ML ~~LOC~~ SOLN: 0.0000 [IU] | Freq: Four times a day (QID) | SUBCUTANEOUS | Status: DC

## 2015-03-12 MED ORDER — CHLORHEXIDINE GLUCONATE 0.12 % MT SOLN
15.0000 mL | Freq: Two times a day (BID) | OROMUCOSAL | Status: DC
  Administered 2015-03-12 – 2015-03-15 (×7): 15 mL via OROMUCOSAL
  Filled 2015-03-12 (×7): qty 15

## 2015-03-12 MED ORDER — ACETAMINOPHEN 650 MG RE SUPP
650.0000 mg | Freq: Four times a day (QID) | RECTAL | Status: DC | PRN
Start: 2015-03-12 — End: 2015-03-16

## 2015-03-12 MED ORDER — HEPARIN SODIUM (PORCINE) 5000 UNIT/ML IJ SOLN
5000.0000 [IU] | Freq: Three times a day (TID) | INTRAMUSCULAR | Status: DC
  Administered 2015-03-12 – 2015-03-15 (×9): 5000 [IU] via SUBCUTANEOUS
  Filled 2015-03-12 (×9): qty 1

## 2015-03-12 MED ORDER — INSULIN ASPART 100 UNIT/ML ~~LOC~~ SOLN
0.0000 [IU] | Freq: Three times a day (TID) | SUBCUTANEOUS | Status: DC
  Administered 2015-03-12: 2 [IU] via SUBCUTANEOUS
  Administered 2015-03-12: 1 [IU] via SUBCUTANEOUS
  Administered 2015-03-13: 5 [IU] via SUBCUTANEOUS
  Administered 2015-03-13: 2 [IU] via SUBCUTANEOUS
  Administered 2015-03-14: 5 [IU] via SUBCUTANEOUS
  Administered 2015-03-14: 3 [IU] via SUBCUTANEOUS
  Administered 2015-03-14: 2 [IU] via SUBCUTANEOUS
  Administered 2015-03-14: 5 [IU] via SUBCUTANEOUS
  Administered 2015-03-15: 1 [IU] via SUBCUTANEOUS
  Administered 2015-03-15: 3 [IU] via SUBCUTANEOUS
  Filled 2015-03-12: qty 1
  Filled 2015-03-12 (×3): qty 2
  Filled 2015-03-12 (×2): qty 5
  Filled 2015-03-12: qty 3
  Filled 2015-03-12: qty 1
  Filled 2015-03-12: qty 5
  Filled 2015-03-12: qty 2
  Filled 2015-03-12: qty 3

## 2015-03-12 MED ORDER — VENLAFAXINE HCL ER 37.5 MG PO CP24
37.5000 mg | ORAL_CAPSULE | Freq: Every day | ORAL | Status: DC
  Administered 2015-03-12 – 2015-03-14 (×3): 37.5 mg via ORAL
  Filled 2015-03-12 (×4): qty 1

## 2015-03-12 MED ORDER — ACETAMINOPHEN 325 MG PO TABS
650.0000 mg | ORAL_TABLET | Freq: Four times a day (QID) | ORAL | Status: DC | PRN
  Administered 2015-03-12 – 2015-03-14 (×3): 650 mg via ORAL
  Filled 2015-03-12 (×3): qty 2

## 2015-03-12 MED ORDER — PANTOPRAZOLE SODIUM 40 MG PO TBEC
40.0000 mg | DELAYED_RELEASE_TABLET | Freq: Two times a day (BID) | ORAL | Status: DC
  Administered 2015-03-12 – 2015-03-15 (×8): 40 mg via ORAL
  Filled 2015-03-12 (×8): qty 1

## 2015-03-12 MED ORDER — ROPINIROLE HCL 1 MG PO TABS
2.0000 mg | ORAL_TABLET | Freq: Every day | ORAL | Status: DC
  Administered 2015-03-12 – 2015-03-14 (×3): 2 mg via ORAL
  Filled 2015-03-12: qty 8
  Filled 2015-03-12 (×2): qty 2
  Filled 2015-03-12: qty 8
  Filled 2015-03-12: qty 2
  Filled 2015-03-12: qty 8

## 2015-03-12 MED ORDER — DEXTROSE 250 MG/ML IV SOLN: 25.0000 g | Freq: Once | INTRAVENOUS | Status: DC

## 2015-03-12 MED ORDER — SODIUM CHLORIDE 0.9 % IV SOLN
1.0000 g | Freq: Once | INTRAVENOUS | Status: AC
  Administered 2015-03-12: 1 g via INTRAVENOUS
  Filled 2015-03-12: qty 10

## 2015-03-12 MED ORDER — MIDODRINE HCL 5 MG PO TABS
5.0000 mg | ORAL_TABLET | ORAL | Status: DC
  Administered 2015-03-13: 5 mg via ORAL
  Filled 2015-03-12: qty 1

## 2015-03-12 MED ORDER — FLUTICASONE FUROATE-VILANTEROL 100-25 MCG/INH IN AEPB: 1.0000 | INHALATION_SPRAY | Freq: Every day | RESPIRATORY_TRACT | Status: DC

## 2015-03-12 MED ORDER — ASPIRIN EC 81 MG PO TBEC
81.0000 mg | DELAYED_RELEASE_TABLET | Freq: Every day | ORAL | Status: DC
  Administered 2015-03-12 – 2015-03-15 (×4): 81 mg via ORAL
  Filled 2015-03-12 (×5): qty 1

## 2015-03-12 MED ORDER — CALCIUM ACETATE (PHOS BINDER) 667 MG PO CAPS
2001.0000 mg | ORAL_CAPSULE | Freq: Every day | ORAL | Status: DC
  Administered 2015-03-12 – 2015-03-15 (×4): 2001 mg via ORAL
  Filled 2015-03-12 (×4): qty 3

## 2015-03-12 MED ORDER — ONDANSETRON HCL 4 MG/2ML IJ SOLN: 4.0000 mg | Freq: Four times a day (QID) | INTRAMUSCULAR | Status: DC | PRN

## 2015-03-12 NOTE — NC FL2 (Signed)
[place] MEDICAID FL2 LEVEL OF CARE SCREENING TOOL     IDENTIFICATION  Patient Name: Savannah Key Birthdate: 1940/03/26 Sex: female Admission Date (Current Location): 03/21/2015  [place] and [place] Number:  [address]  ([id number] [facility]) Facility and Address:  [facility], [address], [place], [place] [address]      Provider Number: [id number]  Attending Physician Name and Address:  Delfino Lovett, MD  Relative Name and Phone Number:       Current Level of Care: Hospital Recommended Level of Care: Skilled Nursing Facility Prior Approval Number:    Date Approved/Denied:   PASRR Number:  [id number])  Discharge Plan: SNF    Current Diagnoses: Patient Active Problem List   Diagnosis Date Noted  . OSA (obstructive sleep apnea) 03/12/2015  . Bradycardia 03/06/2015  . GERD (gastroesophageal reflux disease) 03/06/2015  . CAD (coronary artery disease) 03/06/2015  . Hypothyroidism 03/06/2015  . Anxiety 03/06/2015  . Type 2 diabetes mellitus (HCC) 03/06/2015  . ESRD on dialysis (HCC) 03/06/2015  . Generalized weakness 03/04/2015  . Acute on chronic systolic (congestive) heart failure (HCC) 12/07/2014    Orientation RESPIRATION BLADDER Height & Weight     Self, Time, Situation, Place  O2 (Nasal Cannula 2 (L/min) ) Continent Weight: 206 lb 8 oz (93.668 kg) Height:  5' (152.4 cm)  BEHAVIORAL SYMPTOMS/MOOD NEUROLOGICAL BOWEL NUTRITION STATUS   (None)  (None) Continent Diet (renal/carb modified with fluid restriction )  AMBULATORY STATUS COMMUNICATION OF NEEDS Skin   Extensive Assist Verbally Normal                       Personal Care Assistance Level of Assistance  Bathing, Feeding, Dressing Bathing Assistance: Limited assistance Feeding assistance: Independent Dressing Assistance: Limited assistance     Functional Limitations Info  Sight, Hearing, Speech Sight Info: Adequate Hearing Info: Adequate Speech Info: Adequate    SPECIAL CARE  FACTORS FREQUENCY  PT (By licensed PT)     PT Frequency:  (5)              Contractures      Additional Factors Info  Code Status, Allergies, Insulin Sliding Scale Code Status Info:  (Ful Code) Allergies Info:  (Sulfa Antibiotics)   Insulin Sliding Scale Info:  (insulin aspart (novoLOG) injection 0-9 Units 0-9 Units, Subcutaneous, 3 times daily before meals & bedtime )       Current Medications (03/12/2015):  This is the current hospital active medication list Current Facility-Administered Medications  Medication Dose Route Frequency Provider Last Rate Last Dose  . acetaminophen (TYLENOL) tablet 650 mg  650 mg Oral Q6H PRN Oralia Manis, MD   650 mg at 03/12/15 0840   Or  . acetaminophen (TYLENOL) suppository 650 mg  650 mg Rectal Q6H PRN Oralia Manis, MD      . antiseptic oral rinse (CPC / CETYLPYRIDINIUM CHLORIDE 0.05%) solution 7 mL  7 mL Mouth Rinse q12n4p Oralia Manis, MD   7 mL at 03/12/15 1200  . aspirin EC tablet 81 mg  81 mg Oral Daily Oralia Manis, MD   81 mg at 03/12/15 5284  . calcium acetate (PHOSLO) capsule 2,001 mg  2,001 mg Oral Daily Oralia Manis, MD   2,001 mg at 03/12/15 [phone]  . chlorhexidine (PERIDEX) 0.12 % solution 15 mL  15 mL Mouth Rinse BID Oralia Manis, MD   15 mL at 03/12/15 0834  . clopidogrel (PLAVIX) tablet 75 mg  75 mg Oral Daily Oralia Manis, MD  75 mg at 03/12/15 0835  . heparin injection 5,000 Units  5,000 Units Subcutaneous 3 times per day Oralia Manis, MD   5,000 Units at 03/12/15 [phone]  . insulin aspart (novoLOG) injection 0-9 Units  0-9 Units Subcutaneous TID AC & HS Oralia Manis, MD   1 Units at 03/12/15 1204  . ipratropium-albuterol (DUONEB) 0.5-2.5 (3) MG/3ML nebulizer solution 3 mL  3 mL Nebulization Q4H Oralia Manis, MD   3 mL at 03/12/15 1152  . levothyroxine (SYNTHROID, LEVOTHROID) tablet 75 mcg  75 mcg Oral QAC breakfast Oralia Manis, MD   75 mcg at 03/12/15 [phone]  . mometasone-formoterol (DULERA) 100-5 MCG/ACT inhaler 2 puff  2 puff  Inhalation BID Oralia Manis, MD   2 puff at 03/12/15 [phone]  . morphine 2 MG/ML injection 2 mg  2 mg Intravenous Once Sharyn Creamer, MD   Stopped at 03/18/2015 2333  . ondansetron (ZOFRAN) tablet 4 mg  4 mg Oral Q6H PRN Oralia Manis, MD       Or  . ondansetron [facility]) injection 4 mg  4 mg Intravenous Q6H PRN Oralia Manis, MD      . pantoprazole (PROTONIX) EC tablet 40 mg  40 mg Oral BID Oralia Manis, MD   40 mg at 03/12/15 0834  . pregabalin (LYRICA) capsule 75 mg  75 mg Oral QHS Oralia Manis, MD      . sodium chloride flush (NS) 0.9 % injection 3 mL  3 mL Intravenous Q12H Oralia Manis, MD   3 mL at 03/12/15 1000  . venlafaxine XR (EFFEXOR-XR) 24 hr capsule 37.5 mg  37.5 mg Oral QHS Oralia Manis, MD         Discharge Medications: Please see discharge summary for a list of discharge medications.  Relevant Imaging Results:  Relevant Lab Results:   Additional Information  (SSN [id number])  Verta Ellen Porfiria Heinrich, LCSW

## 2015-03-12 NOTE — Care Management (Signed)
Fourth admission since Dec 2016 for this [facility].   She has refused skilled nursing placement last two admissions but per Selena Batten with [facility]- she is now agreeable for skilled nursing.  Coordination of skilled nursing placement at [facility] was initiated a couple of days ago.  Updated CSW

## 2015-03-12 NOTE — H&P (Signed)
[facility] - [facility] at [facility]   PATIENT NAME: Savannah Key    MR#:  [id number]  DATE OF BIRTH:  [birth year]  DATE OF ADMISSION:  Mar 23, 2015  PRIMARY CARE PHYSICIAN: Bobbye Morton, MD   REQUESTING/REFERRING PHYSICIAN: Fanny Bien, MD  CHIEF COMPLAINT:   Chief Complaint  Patient presents with  . Weakness  . Cough  . Shortness of Breath    HISTORY OF PRESENT ILLNESS:  Savannah Key  is a 75 y.o. female who presents with cough and shortness of breath. Patient has recently been admitted here in the hospital multiple times. She went to dialysis today, but has been having persistent the increasing dyspnea. She was sent here after dialysis with concern for pneumonia versus volume overload. She has gained a significant amount of weight, about 10 pounds, over the past several days. In the ED she was found to be in likely fluid overload. Patient also has congestive heart failure, and her BNP was elevated with if can pulmonary edema on her chest x-ray. Hospitalists were called for admission.  PAST MEDICAL HISTORY:   Past Medical History  Diagnosis Date  . Chronic kidney disease   . GERD (gastroesophageal reflux disease)   . Carotid artery stenosis   . Coronary artery disease   . Hypothyroidism   . Diabetes mellitus without complication (HCC)   . Depression   . Arthritis   . Kidney failure   . Liver failure (HCC)   . MI (myocardial infarction) (HCC)   . HTN (hypertension)   . Broken leg     R  . CHF (congestive heart failure) (HCC)     PAST SURGICAL HISTORY:   Past Surgical History  Procedure Laterality Date  . Av fistula placement Left   . Cholecystectomy    . Peripheral vascular catheterization N/A 06/11/2014    Procedure: A/V Shuntogram/Fistulagram;  Surgeon: Annice Needy, MD;  Location: [facility] INVASIVE CV LAB;  Service: Cardiovascular;  Laterality: N/A;  . Peripheral vascular catheterization Left 06/11/2014    Procedure: A/V Shunt Intervention;   Surgeon: Annice Needy, MD;  Location: [facility] INVASIVE CV LAB;  Service: Cardiovascular;  Laterality: Left;  . Open reduction internal fixation (orif) distal radial fracture Left 08/09/2014    Procedure: OPEN REDUCTION INTERNAL FIXATION (ORIF) DISTAL RADIAL FRACTURE;  Surgeon: Kennedy Bucker, MD;  Location: [facility] ORS;  Service: Orthopedics;  Laterality: Left;  . Peripheral vascular catheterization Left 11/06/2014    Procedure: A/V Shuntogram/Fistulagram;  Surgeon: Renford Dills, MD;  Location: [facility] INVASIVE CV LAB;  Service: Cardiovascular;  Laterality: Left;  . Peripheral vascular catheterization Left 11/06/2014    Procedure: A/V Shunt Intervention;  Surgeon: Renford Dills, MD;  Location: [facility] INVASIVE CV LAB;  Service: Cardiovascular;  Laterality: Left;  . Peripheral vascular catheterization Left 01/15/2015    Procedure: A/V Shuntogram/Fistulagram;  Surgeon: Renford Dills, MD;  Location: [facility] INVASIVE CV LAB;  Service: Cardiovascular;  Laterality: Left;  . Peripheral vascular catheterization N/A 01/15/2015    Procedure: A/V Shunt Intervention;  Surgeon: Renford Dills, MD;  Location: [facility] INVASIVE CV LAB;  Service: Cardiovascular;  Laterality: N/A;    SOCIAL HISTORY:   Social History  Substance Use Topics  . Smoking status: Never Smoker   . Smokeless tobacco: Never Used  . Alcohol Use: No    FAMILY HISTORY:   Family History  Problem Relation Age of Onset  . Hypertension Father   . Heart attack Mother   . Diabetes Father   .  Cancer Father     DRUG ALLERGIES:   Allergies  Allergen Reactions  . Sulfa Antibiotics Other (See Comments)    Reaction:  Blood in the urine     MEDICATIONS AT HOME:   Prior to Admission medications   Medication Sig Start Date End Date Taking? Authorizing Provider  acetaminophen (TYLENOL) 325 MG tablet Take 2 tablets (650 mg total) by mouth every 6 (six) hours as needed for mild pain (or Fever >/= 101). 03/08/15  Yes Ramonita Lab, MD  albuterol  (PROVENTIL HFA;VENTOLIN HFA) 108 (90 Base) MCG/ACT inhaler Inhale 2 puffs into the lungs every 4 (four) hours as needed for shortness of breath.   Yes Historical Provider, MD  alendronate (FOSAMAX) 70 MG tablet Take 70 mg by mouth once a week. Take with a full glass of water on an empty stomach. Patient takes on Monday morning   Yes Historical Provider, MD  aspirin EC 81 MG tablet Take 81 mg by mouth daily.    Yes Historical Provider, MD  benzonatate (TESSALON) 100 MG capsule Take 100 mg by mouth 3 (three) times daily as needed for cough.   Yes Historical Provider, MD  budesonide (PULMICORT) 0.25 MG/2ML nebulizer solution Take 2 mLs (0.25 mg total) by nebulization 2 (two) times daily. 12/19/14  Yes Alford Highland, MD  calcium acetate (PHOSLO) 667 MG capsule Take 3 capsules (2,001 mg total) by mouth daily. 03/08/15  Yes Ramonita Lab, MD  Carboxymethylcellul-Glycerin 0.5-0.9 % SOLN Place 1 drop into both eyes 3 (three) times daily.   Yes Historical Provider, MD  cetirizine (ZYRTEC) 5 MG tablet Take 5 mg by mouth at bedtime.    Yes Historical Provider, MD  clopidogrel (PLAVIX) 75 MG tablet Take 1 tablet (75 mg total) by mouth daily. 10/04/14  Yes Srikar Sudini, MD  fluticasone (FLONASE) 50 MCG/ACT nasal spray Place 2 sprays into both nostrils daily.   Yes Historical Provider, MD  Fluticasone Furoate-Vilanterol (BREO ELLIPTA) 100-25 MCG/INH AEPB Inhale 1 puff into the lungs daily. 01/10/15 01/11/16 Yes Erin Fulling, MD  glucose 4 GM chewable tablet Chew 1 tablet by mouth as needed for low blood sugar.   Yes Historical Provider, MD  insulin aspart (NOVOLOG FLEXPEN) 100 UNIT/ML FlexPen Inject 12 Units into the skin 3 (three) times daily with meals.   Yes Historical Provider, MD  insulin glargine (LANTUS) 100 UNIT/ML injection Inject 0.45 mLs (45 Units total) into the skin 2 (two) times daily. Patient taking differently: Inject 42 Units into the skin 2 (two) times daily.  12/19/14  Yes Richard Renae Gloss, MD   ipratropium-albuterol (DUONEB) 0.5-2.5 (3) MG/3ML SOLN Take 3 mLs by nebulization every 4 (four) hours as needed. 12/19/14  Yes Alford Highland, MD  isosorbide mononitrate (IMDUR) 60 MG 24 hr tablet Take 60 mg by mouth daily.    Yes Historical Provider, MD  levothyroxine (SYNTHROID, LEVOTHROID) 75 MCG tablet Take 75 mcg by mouth daily.   Yes Historical Provider, MD  lidocaine (LIDODERM) 5 % Place 1 patch onto the skin daily. Remove & Discard patch within 12 hours or as directed by MD   Yes Historical Provider, MD  loperamide (IMODIUM) 2 MG capsule Take 2-4 mg by mouth as needed for diarrhea or loose stools.    Yes Historical Provider, MD  midodrine (PROAMATINE) 5 MG tablet Take 1 tablet (5 mg total) by mouth 3 (three) times daily with meals. 03/06/15  Yes Houston Siren, MD  mineral oil-hydrophilic petrolatum (AQUAPHOR) ointment Apply 1 application topically as needed for  dry skin.   Yes Historical Provider, MD  nitroGLYCERIN (NITROSTAT) 0.4 MG SL tablet Place 0.4 mg under the tongue every 5 (five) minutes as needed for chest pain.   Yes Historical Provider, MD  Nutritional Supplements (FEEDING SUPPLEMENT, NEPRO CARB STEADY,) LIQD Take 237 mLs by mouth daily.   Yes Historical Provider, MD  pantoprazole (PROTONIX) 40 MG tablet Take 40 mg by mouth 2 (two) times daily.   Yes Historical Provider, MD  pregabalin (LYRICA) 75 MG capsule Take 75 mg by mouth at bedtime.   Yes Historical Provider, MD  psyllium (METAMUCIL SMOOTH TEXTURE) 28 % packet Take 1 packet by mouth daily.   Yes Historical Provider, MD  ranitidine (ZANTAC) 150 MG tablet Take 150 mg by mouth at bedtime.   Yes Historical Provider, MD  rOPINIRole (REQUIP) 2 MG tablet Take 2 mg by mouth at bedtime.   Yes Historical Provider, MD  traZODone (DESYREL) 50 MG tablet Take 50 mg by mouth at bedtime.   Yes Historical Provider, MD  triamcinolone (KENALOG) 0.1 % paste Use as directed 1 application in the mouth or throat at bedtime as needed (gum  irritation).   Yes Historical Provider, MD  trolamine salicylate (ASPERCREME) 10 % cream Apply 1 application topically 4 (four) times daily.   Yes Historical Provider, MD  Vaginal Lubricant (REPLENS) GEL Place 1 application vaginally as needed (vaginal dryness).   Yes Historical Provider, MD  venlafaxine XR (EFFEXOR-XR) 37.5 MG 24 hr capsule Take 37.5 mg by mouth at bedtime.   Yes Historical Provider, MD    REVIEW OF SYSTEMS:  Review of Systems  Constitutional: Negative for fever, chills, weight loss and malaise/fatigue.  HENT: Negative for ear pain, hearing loss and tinnitus.   Eyes: Negative for blurred vision, double vision, pain and redness.  Respiratory: Positive for cough, shortness of breath and wheezing. Negative for hemoptysis.   Cardiovascular: Positive for orthopnea. Negative for chest pain, palpitations and leg swelling.  Gastrointestinal: Negative for nausea, vomiting, abdominal pain, diarrhea and constipation.  Genitourinary: Negative for dysuria, frequency and hematuria.  Musculoskeletal: Negative for back pain, joint pain and neck pain.  Skin:       No acne, rash, or lesions  Neurological: Negative for dizziness, tremors, focal weakness and weakness.  Endo/Heme/Allergies: Negative for polydipsia. Does not bruise/bleed easily.  Psychiatric/Behavioral: Negative for depression. The patient is not nervous/anxious and does not have insomnia.      VITAL SIGNS:   Filed Vitals:   2015-04-09 2300 09-Apr-2015 2330 2015-04-09 2332 03/12/15 0000  BP: 106/82 89/45  106/45  Pulse: 79 86  77  Temp:      TempSrc:      Resp: 20 20  27   Height:      Weight:      SpO2: 95% 94% 99% 100%   Wt Readings from Last 3 Encounters:  04/09/15 89.812 kg (198 lb)  03/08/15 93.7 kg (206 lb 9.1 oz)  03/06/15 90 kg (198 lb 6.6 oz)    PHYSICAL EXAMINATION:  Physical Exam  Vitals reviewed. Constitutional: She is oriented to person, place, and time. She appears well-developed and well-nourished. No  distress.  HENT:  Head: Normocephalic and atraumatic.  Mouth/Throat: Oropharynx is clear and moist.  Eyes: Conjunctivae and EOM are normal. Pupils are equal, round, and reactive to light. No scleral icterus.  Neck: Normal range of motion. Neck supple. No JVD present. No thyromegaly present.  Cardiovascular: Normal rate, regular rhythm and intact distal pulses.  Exam reveals no gallop and  no friction rub.   No murmur heard. Respiratory: She is in respiratory distress (mild). She has wheezes. She has rales (Basilar).  GI: Soft. Bowel sounds are normal. She exhibits no distension. There is no tenderness.  Musculoskeletal: Normal range of motion. She exhibits no edema.  No arthritis, no gout  Lymphadenopathy:    She has no cervical adenopathy.  Neurological: She is alert and oriented to person, place, and time. No cranial nerve deficit.  No dysarthria, no aphasia  Skin: Skin is warm and dry. No rash noted. No erythema.  Psychiatric: She has a normal mood and affect. Her behavior is normal. Judgment and thought content normal.    LABORATORY PANEL:   CBC  Recent Labs Lab 03/25/2015 1715  WBC 7.0  HGB 7.8*  HCT 26.2*  PLT 76*   ------------------------------------------------------------------------------------------------------------------  Chemistries   Recent Labs Lab 2015/03/25 1715  NA 136  K 3.8  CL 97*  CO2 29  GLUCOSE 142*  BUN 19  CREATININE 3.28*  CALCIUM 7.9*   ------------------------------------------------------------------------------------------------------------------  Cardiac Enzymes  Recent Labs Lab March 25, 2015 1715  TROPONINI 0.99*   ------------------------------------------------------------------------------------------------------------------  RADIOLOGY:  Dg Chest Port 1 View  03/25/2015  CLINICAL DATA:  Increased weakness and shortness of breath with cough. Upper back pain. EXAM: PORTABLE CHEST 1 VIEW COMPARISON:  03/06/2015 FINDINGS: 1900 hours  The lungs are clear wiithout focal pneumonia, edema, pneumothorax or pleural effusion. There is pulmonary vascular congestion without overt pulmonary edema. The cardio pericardial silhouette is enlarged. The visualized bony structures of the thorax are intact. IMPRESSION: Stable. Enlargement of the cardiopericardial silhouette with vascular congestion. Electronically Signed   By: Kennith Center M.D.   On: 2015/03/25 19:33    EKG:   Orders placed or performed during the hospital encounter of Mar 25, 2015  . ED EKG  . ED EKG  . EKG 12-Lead  . EKG 12-Lead    IMPRESSION AND PLAN:  Principal Problem:   Acute on chronic systolic (congestive) heart failure (HCC) - likely due to the patient's overload status. Given her low blood pressure, and the fact that she had dialysis today, we are not able to use diuretics at this time, despite the fact that she reports still making some urine. However, she has sleep apnea, and typically uses BiPAP at night. So we will order this for her here to help with her pulmonary edema. Active Problems:   ESRD on dialysis [facility]) - nephrology consult for dialysis support. ED physician spoke with the nephrologist on-call, who stated that the patient will likely get dialysis again tomorrow.   OSA - BiPAP as above   CAD (coronary artery disease) - continue home meds, trend cardiac enzymes which are elevated, though they seem to be coming down from her prior admission.   Anxiety - continue home meds   Type 2 diabetes mellitus (HCC) - sliding scale insulin with appropriate glucose checks every 6 hours while patient is nothing by mouth for now.   GERD (gastroesophageal reflux disease) - home dose PPI   Hypothyroidism - home dose thyroid replacement  All the records are reviewed and case discussed with ED provider. Management plans discussed with the patient and/or family.  DVT PROPHYLAXIS: SubQ heparin  GI PROPHYLAXIS: PPI  ADMISSION STATUS: Inpatient  CODE STATUS: Full Code  Status History    Date Active Date Inactive Code Status Order ID Comments User Context   03/08/2015  9:29 AM 03/08/2015  7:24 PM DNR [id number]  Ramonita Lab, MD Inpatient   03/07/2015  2:01 AM 03/08/2015  9:28 AM Full Code [id number]  Oralia Manis, MD Inpatient   03/07/2015 12:02 AM 03/07/2015  2:01 AM DNR [id number]  Oralia Manis, MD Inpatient   03/05/2015  1:21 AM 03/06/2015  8:24 PM Full Code [id number]  Arnaldo Natal, MD ED   01/15/2015  1:22 PM 01/15/2015  5:50 PM Full Code [id number]  Renford Dills, MD Inpatient   12/07/2014 10:12 PM 12/19/2014  6:16 PM Full Code [id number]  Wyatt Haste, MD ED   10/01/2014  8:51 AM 10/05/2014  6:57 PM Full Code [id number]  Arnaldo Natal, MD Inpatient   08/09/2014 11:21 AM 08/09/2014  5:05 PM Full Code [id number]  Kennedy Bucker, MD Inpatient    Questions for Most Recent Historical Code Status (Order [id number])    Question Answer Comment   In the event of cardiac or respiratory ARREST Do not call a "code blue"    In the event of cardiac or respiratory ARREST Do not perform Intubation, CPR, defibrillation or ACLS    In the event of cardiac or respiratory ARREST Use medication by any route, position, wound care, and other measures to relive pain and suffering. May use oxygen, suction and manual treatment of airway obstruction as needed for comfort.    Comments RN may pronounce     Advance Directive Documentation        Most Recent Value   Type of Advance Directive  Living will, Out of facility DNR (pink MOST or yellow form)   Pre-existing out of facility DNR order (yellow form or pink MOST form)  Yellow form placed in chart (order not valid for inpatient use), Pink MOST form placed in chart (order not valid for inpatient use)   "MOST" Form in Place?        TOTAL TIME TAKING CARE OF THIS PATIENT: 45 minutes.    Bookert Guzzi FIELDING 03/12/2015, 1:02 AM  [facility]  Office  [phone]  CC: Primary care physician; Bobbye Morton,  MD

## 2015-03-12 NOTE — Progress Notes (Signed)
Informed Dr.Willis about patient's current pain 8/10 located at the mid lower back which is increased with cough. New order: robitussin DM , and Norco 5-325.

## 2015-03-12 NOTE — Progress Notes (Signed)
Pt's blood sugar47, gave juice, sherbert & peanut butter crackers, rechecked, blood sugar 39, gave more juice & paged MD, Standing orders for hypoglycemia, to give amp of D50. Dr. Anne Hahn agreed & changed diet order, insulin & blood sugar checks. Rechecked & final sugar of 237. Will continue to monitor. Shirley Friar, RN

## 2015-03-12 NOTE — ED Notes (Signed)
Pharm called for calcium gluc IVPB, pharm is mixing medication and sending it upstairs per pharm

## 2015-03-12 NOTE — Progress Notes (Signed)
Inpatient Diabetes Program Recommendations  AACE/ADA: New Consensus Statement on Inpatient Glycemic Control (2015)  Target Ranges:  Prepandial:   less than 140 mg/dL      Peak postprandial:   less than 180 mg/dL (1-2 hours)      Critically ill patients:  140 - 180 mg/dL   Review of Glycemic Control  Results for Savannah Key, Savannah Key (MRN [id number]) as of 03/12/2015 10:33  Ref. Range 03/12/2015 02:42 03/12/2015 02:58 03/12/2015 03:00 03/12/2015 03:21 03/12/2015 07:26  Glucose-Capillary Latest Ref Range: 65-99 mg/dL 47 (L) 39 (LL) 56 (L) 409 (H) 156 (H)    Diabetes history: Type 2 Outpatient Diabetes medications: Novolog 12 units tid, Lantus 42 units bid Current orders for Inpatient glycemic control: Novolog 0-9 units tid and hs  Inpatient Diabetes Program Recommendations: Patient will likely require basal insulin, consider starting Lantus 22 units qhs tonight at hs.   Please treat hypoglycemia per protocol- found in the summary side bar (may need to find it and wrench it in) - Adult (Non-Pregnant) Hypoglycemia Standing Orders   Do not over treat- if the patient is awake and alert, 4 oz of juice with BG check q15 minutes later    Susette Racer, RN, [place], [place], CDE Diabetes Coordinator Inpatient Diabetes Program  [phone] (Team Pager) [phone] [facility] Office) 03/12/2015 10:39 AM

## 2015-03-12 NOTE — Clinical Social Work Note (Signed)
Clinical Social Work Assessment  Patient Details  Name: Savannah Key MRN: [id number] Date of Birth: [birth year]  Date of referral:  03/12/15               Reason for consult:  Discharge Planning                Permission sought to share information with:    Permission granted to share information::  No  Name::        Agency::     Relationship::     Contact Information:     Housing/Transportation Living arrangements for the past 2 months:    Source of Information:  Patient Patient Interpreter Needed:  None Criminal Activity/Legal Involvement Pertinent to Current Situation/Hospitalization:  No - Comment as needed Significant Relationships:  Adult Children Lives with:  Self Do you feel safe going back to the place where you live?  No Need for family participation in patient care:  No (Coment)  Care giving concerns:  Patient is a re-admit. She is a [facility] patient and stated she cannot return home at discharge due to weakness and not being able to walk.  Social Worker assessment / plan: CSW was informed by Chief Strategy Officer that patient is a re-admit and would like SNF placement at discharge. CSW is familiar with patient due to patient declining SNF placement last week.CSW met with patient at bedside. Patient reports that she's willing to go to [facility]. She sated "I'll try something different this time.." CSW obtained verbal permission to speak to [facility] and [place].    CSW called Jenny Reichmann- Education officer, museum  with [facility] to determine patient's discharge plans. Per Jenny Reichmann patient will be going to [facility] at discharge. He reports that a referral has been sent to [facility] on patient's behalf.   CSW spoke to [place]- admissions coordinator at [facility]. She reports that they have accepted patient but only if there is an available bed at discharge.   FL2/ PASRR completed. CSW will continue to follow and assist.   Employment status:  Retired Forensic scientist:  Medicare PT Recommendations:  [place] / Referral to community resources:  [place]  Patient/Family's Response to care:  Patient is in agreement for SNF placement at [facility].   Patient/Family's Understanding of and Emotional Response to Diagnosis, Current Treatment, and Prognosis:  Patient understands CSW's role and is appreciative of her assistance.   Emotional Assessment Appearance:  Appears stated age Attitude/Demeanor/Rapport:   (None) Affect (typically observed):  Calm, Pleasant Orientation:  Oriented to Self, Oriented to Place, Oriented to  Time, Oriented to Situation Alcohol / Substance use:  Not Applicable Psych involvement (Current and /or in the community):  No (Comment)  Discharge Needs  Concerns to be addressed:  Discharge Planning Concerns Readmission within the last 30 days:  No Current discharge risk:  Chronically ill Barriers to Discharge:  Continued Medical Work up   [employer], LCSW 03/12/2015, 3:31 PM

## 2015-03-12 NOTE — ED Notes (Signed)
MD Quale aware of pt BP, continue to monitor

## 2015-03-12 NOTE — Progress Notes (Signed)
Subjective:  Patient is known to our practice from previous admissions She states that she presented with cough, weakness, shortness of breath She denies missing any dialysis treatments   Objective:  Vital signs in last 24 hours:  Temp:  [98.1 F (36.7 C)-99.5 F (37.5 C)] 98.7 F (37.1 C) (02/21 1101) Pulse Rate:  [43-123] 59 (02/21 1101) Resp:  [18-32] 18 (02/21 1101) BP: (75-112)/(35-82) 106/58 mmHg (02/21 1101) SpO2:  [93 %-100 %] 97 % (02/21 1152) Weight:  [89.812 kg (198 lb)-93.668 kg (206 lb 8 oz)] 93.668 kg (206 lb 8 oz) (02/21 0348)  Weight change:  Filed Weights   02/25/2015 1710 03/12/15 0207 03/12/15 0348  Weight: 89.812 kg (198 lb) 93.668 kg (206 lb 8 oz) 93.668 kg (206 lb 8 oz)    Intake/Output:    Intake/Output Summary (Last 24 hours) at 03/12/15 1525 Last data filed at 03/12/15 1448  Gross per 24 hour  Intake    243 ml  Output     50 ml  Net    193 ml     Physical Exam: General: Obese, laying in the bed  HEENT anicteric  Neck supple  Pulm/lungs Bilateral diffuse crackles  CVS/Heart irregular  Abdomen:  Soft, obese, nontender  Extremities: + dependent edema  Neurologic: Lethargic but arousable and able to answer questions  Skin: No acute rashes  Access: AV fistula       Basic Metabolic Panel:   Recent Labs Lab 03/06/15 0838 03/06/15 1808 03/07/15 0100 03/07/15 0411 03/08/15 1100 03/18/2015 1715 03/12/15 0348  NA 134* 137  --  137 132* 136 135  K 5.2* 4.1  --  4.5 5.1 3.8 4.0  CL 97* 97*  --  99* 94* 97* 98*  CO2 27 29  --  GLUCOSE 108* 179*  --  168* 270* 142* 213*  BUN 52* 25*  --  32* 50* 19 24*  CREATININE 5.48* 3.26* 3.82* 3.96* 5.50* 3.28* 3.90*  CALCIUM 8.2* 8.3*  --  8.0* 8.2* 7.9* 7.9*  PHOS 8.1*  --   --   --  5.8*  --   --      CBC:  Recent Labs Lab 03/06/15 1808 03/07/15 0100 03/07/15 0411 03/08/15 0324 03/05/2015 1715 03/12/15 0348  WBC 9.9 9.6 8.6 9.5 7.0 7.6  NEUTROABS 7.2*  --   --   --   --    --   HGB 7.6* 7.9* 7.1* 7.5* 7.8* 8.0*  HCT 24.8* 25.8* 23.1* 24.0* 26.2* 26.1*  MCV 80.0 81.3 80.8 78.4* 80.6 81.5  PLT 146* 135* 125* 123* 76* 77*      Microbiology:  Recent Results (from the past 720 hour(s))  MRSA PCR Screening     Status: None   Collection Time: 03/05/15  8:09 AM  Result Value Ref Range Status   MRSA by PCR NEGATIVE NEGATIVE Final    Comment:        The GeneXpert MRSA Assay (FDA approved for NASAL specimens only), is one component of a comprehensive MRSA colonization surveillance program. It is not intended to diagnose MRSA infection nor to guide or monitor treatment for MRSA infections.     Coagulation Studies: No results for input(s): LABPROT, INR in the last 72 hours.  Urinalysis: No results for input(s): COLORURINE, LABSPEC, PHURINE, GLUCOSEU, HGBUR, BILIRUBINUR, KETONESUR, PROTEINUR, UROBILINOGEN, NITRITE, LEUKOCYTESUR in the last 72 hours.  Invalid input(s): APPERANCEUR    Imaging: Dg Chest Port 1 View  03/16/2015  CLINICAL  DATA:  Increased weakness and shortness of breath with cough. Upper back pain. EXAM: PORTABLE CHEST 1 VIEW COMPARISON:  03/06/2015 FINDINGS: 1900 hours The lungs are clear wiithout focal pneumonia, edema, pneumothorax or pleural effusion. There is pulmonary vascular congestion without overt pulmonary edema. The cardio pericardial silhouette is enlarged. The visualized bony structures of the thorax are intact. IMPRESSION: Stable. Enlargement of the cardiopericardial silhouette with vascular congestion. Electronically Signed   By: Kennith Center M.D.   On: 03/10/2015 19:33     Medications:     . antiseptic oral rinse  7 mL Mouth Rinse q12n4p  . aspirin EC  81 mg Oral Daily  . calcium acetate  2,001 mg Oral Daily  . chlorhexidine  15 mL Mouth Rinse BID  . clopidogrel  75 mg Oral Daily  . heparin  5,000 Units Subcutaneous 3 times per day  . insulin aspart  0-9 Units Subcutaneous TID AC & HS  . ipratropium-albuterol  3 mL  Nebulization Q4H  . levothyroxine  75 mcg Oral QAC breakfast  . mometasone-formoterol  2 puff Inhalation BID  .  morphine injection  2 mg Intravenous Once  . pantoprazole  40 mg Oral BID  . pregabalin  75 mg Oral QHS  . sodium chloride flush  3 mL Intravenous Q12H  . venlafaxine XR  37.5 mg Oral QHS   acetaminophen **OR** acetaminophen, ondansetron **OR** ondansetron (ZOFRAN) IV  Assessment/ Plan:  75 y.o. female  is a 75 y.o. white female with end-stage renal disease, carotid stenosis, coronary artery disease, diabetes type 2, history of myocardial infarction, hypertension, peripheral vascular disease.  MWF [facility]  1. ESRD on HD MWF: - Will arrange for urgent dialysis today for volume overload  2. Anemia of CKD: hemoglobin of 8 - continue Epogen 10,000 units IV with dialysis while inpatient.   3. Secondary Hyperparathyroidism:  - monitor phosphorus this admission  4.  Acute pulmonary edema by clinical assessment - Will arrange for urgent dialysis today for volume removal as tolerated   LOS: 0 Safari Cinque 2/21/20173:25 PM

## 2015-03-12 NOTE — Clinical Social Work Placement (Signed)
   CLINICAL SOCIAL WORK PLACEMENT  NOTE  Date:  03/12/2015  Patient Details  Name: ISADORE BOKHARI MRN: [id number] Date of Birth: 30-Apr-1940  Clinical Social Work is seeking post-discharge placement for this patient at the Skilled  Nursing Facility level of care (*CSW will initial, date and re-position this form in  chart as items are completed):  No   Patient/family provided with [facility] Clinical Social Work Department's list of facilities offering this level of care within the geographic area requested by the patient (or if unable, by the patient's family).  Yes   Patient/family informed of their freedom to choose among providers that offer the needed level of care, that participate in Medicare, Medicaid or managed care program needed by the patient, have an available bed and are willing to accept the patient.  Yes   Patient/family informed of [facility]'s ownership interest in [facility] and [facility], as well as of the fact that they are under no obligation to receive care at these facilities.  PASRR submitted to EDS on       PASRR number received on       Existing PASRR number confirmed on 03/12/15     FL2 transmitted to all facilities in geographic area requested by pt/family on 03/12/15     FL2 transmitted to all facilities within larger geographic area on       Patient informed that his/her managed care company has contracts with or will negotiate with certain facilities, including the following:            Patient/family informed of bed offers received.  Patient chooses bed at       Physician recommends and patient chooses bed at      Patient to be transferred to   on  .  Patient to be transferred to facility by       Patient family notified on   of transfer.  Name of family member notified:        PHYSICIAN       Additional Comment:    _______________________________________________ Idamae Lusher, LCSW 03/12/2015, 2:10 PM

## 2015-03-12 NOTE — Progress Notes (Signed)
Received report from HD specialist, was informed that 2.5 Liters was removed. VS: BP 107/78, HR22, TEMP 98.4, and RR 22,and to remove guaze in 3 hours and monitor for bleeding. HD specialist stated that patient was c/o back pain tylenol administered.

## 2015-03-12 NOTE — Progress Notes (Signed)
Patient has bipap ordered at bedtime, patient stated to remove bipap b/c it was uncomfortable. Nurse encouraged patient to try to sleep with bipap. Patient replied"I don't use bipap at home". Nurse replaced nasal cannula.  Will continue to monitor.

## 2015-03-12 NOTE — Progress Notes (Signed)
[facility] - [place] at [facility]   PATIENT NAME: Savannah Key    MR#:  [id number]  DATE OF BIRTH:  06/15/1940  SUBJECTIVE:   Patient feels better however still with soreness of breath and wheezing  REVIEW OF SYSTEMS:    Review of Systems  Constitutional: Negative for fever, chills and malaise/fatigue.  HENT: Negative for sore throat.   Eyes: Negative for blurred vision.  Respiratory: Positive for cough, shortness of breath and wheezing. Negative for hemoptysis.   Cardiovascular: Positive for orthopnea and leg swelling. Negative for chest pain and palpitations.  Gastrointestinal: Negative for nausea, vomiting, abdominal pain, diarrhea and blood in stool.  Genitourinary: Negative for dysuria.  Musculoskeletal: Negative for back pain.  Neurological: Negative for dizziness, tremors and headaches.  Endo/Heme/Allergies: Does not bruise/bleed easily.    Tolerating Diet: Yes      DRUG ALLERGIES:   Allergies  Allergen Reactions  . Sulfa Antibiotics Other (See Comments)    Reaction:  Blood in the urine     VITALS:  Blood pressure 106/58, pulse 59, temperature 98.7 F (37.1 C), temperature source Oral, resp. rate 18, height 5' (1.524 m), weight 93.668 kg (206 lb 8 oz), SpO2 97 %.  PHYSICAL EXAMINATION:   Physical Exam  Constitutional: She is oriented to person, place, and time and well-developed, well-nourished, and in no distress. No distress.  HENT:  Head: Normocephalic.  Eyes: No scleral icterus.  Neck: Normal range of motion. Neck supple. No JVD present. No tracheal deviation present.  Cardiovascular: Normal rate, regular rhythm and normal heart sounds.  Exam reveals no gallop and no friction rub.   No murmur heard. Pulmonary/Chest: Effort normal. No respiratory distress. She has wheezes. She has rales. She exhibits no tenderness.  Abdominal: Soft. Bowel sounds are normal. She exhibits no distension and no mass. There is no tenderness. There  is no rebound and no guarding.  Musculoskeletal: Normal range of motion. She exhibits edema.  Neurological: She is alert and oriented to person, place, and time.  Skin: Skin is warm. No rash noted. No erythema.  Psychiatric: Affect and judgment normal.      LABORATORY PANEL:   CBC  Recent Labs Lab 03/12/15 0348  WBC 7.6  HGB 8.0*  HCT 26.1*  PLT 77*   ------------------------------------------------------------------------------------------------------------------  Chemistries   Recent Labs Lab 03/12/15 0348  NA 135  K 4.0  CL 98*  CO2 27  GLUCOSE 213*  BUN 24*  CREATININE 3.90*  CALCIUM 7.9*   ------------------------------------------------------------------------------------------------------------------  Cardiac Enzymes  Recent Labs Lab 03/12/2015 1715 03/12/15 0348 03/12/15 1020  TROPONINI 0.99* 1.29* 1.67*   ------------------------------------------------------------------------------------------------------------------  RADIOLOGY:  Dg Chest Port 1 View  03/10/2015  CLINICAL DATA:  Increased weakness and shortness of breath with cough. Upper back pain. EXAM: PORTABLE CHEST 1 VIEW COMPARISON:  03/06/2015 FINDINGS: 1900 hours The lungs are clear wiithout focal pneumonia, edema, pneumothorax or pleural effusion. There is pulmonary vascular congestion without overt pulmonary edema. The cardio pericardial silhouette is enlarged. The visualized bony structures of the thorax are intact. IMPRESSION: Stable. Enlargement of the cardiopericardial silhouette with vascular congestion. Electronically Signed   By: Kennith Center M.D.   On: 03/19/2015 19:33     ASSESSMENT AND PLAN:   74 year old female with a history of end-stage renal disease on hemodialysis and combined systolic and diastolic heart failure who presents with shortness of breath and weight gain.  1. Acute on chronic combined systolic EF 40-45% and diastolic heart failure: Continue hemodialysis for  fluid removal. She would benefit from Lasix at discharge as she still makes urine.   2. ESRD on dialysis: Patient will again have dialysis today.  3. OSA: Patient will need CPAP at night. Patient does report that she is not compliant with this. I stressed the importance of using her machine at home.  4. Type 2 diabetes: Continue sliding scale insulin and ADA diet.  5. Hypothyroid: Continue Synthroid  6. Anxiety and depression: Continue Effexor or  7. Anemia of chronic disease: Hemoglobin remained stable.      Management plans discussed with the patient and she is in agreement.  CODE STATUS: FULL  TOTAL TIME TAKING CARE OF THIS PATIENT: 30 minutes.     POSSIBLE D/C 2-3 days, DEPENDING ON CLINICAL CONDITION. She will be discharged to skilled nursing facility once medical stable.  Serenitee Fuertes M.D on 03/12/2015 at 1:37 PM  Between 7am to 6pm - Pager - [phone] After 6pm go to [email or web address] - password [email or web address]  [facility]  Office  [phone]  CC: Primary care physician; Bobbye Morton, MD  Note: This dictation was prepared with Dragon dictation along with smaller phrase technology. Any transcriptional errors that result from this process are unintentional.

## 2015-03-13 LAB — BASIC METABOLIC PANEL
ANION GAP: 8 (ref 5–15)
BUN: 20 mg/dL (ref 6–20)
CHLORIDE: 99 mmol/L — AB (ref 101–111)
CO2: 33 mmol/L — AB (ref 22–32)
Calcium: 8 mg/dL — ABNORMAL LOW (ref 8.9–10.3)
Creatinine, Ser: 3.36 mg/dL — ABNORMAL HIGH (ref 0.44–1.00)
GFR calc Af Amer: 15 mL/min — ABNORMAL LOW (ref >60)
GFR, EST NON AFRICAN AMERICAN: 13 mL/min — AB (ref >60)
GLUCOSE: 109 mg/dL — AB (ref 65–99)
POTASSIUM: 5 mmol/L (ref 3.5–5.1)
Sodium: 140 mmol/L (ref 135–145)

## 2015-03-13 LAB — GLUCOSE, CAPILLARY
GLUCOSE-CAPILLARY: 102 mg/dL — AB (ref 65–99)
GLUCOSE-CAPILLARY: 165 mg/dL — AB (ref 65–99)
GLUCOSE-CAPILLARY: 111 mg/dL — AB (ref 65–99)

## 2015-03-13 LAB — TROPONIN I
TROPONIN I: 1.28 ng/mL — AB (ref <0.031)
Troponin I: 1.49 ng/mL — ABNORMAL HIGH (ref <0.031)
Troponin I: 1.34 ng/mL — ABNORMAL HIGH (ref <0.031)

## 2015-03-13 MED ORDER — MIDODRINE HCL 5 MG PO TABS
10.0000 mg | ORAL_TABLET | ORAL | Status: DC
  Administered 2015-03-15: 10 mg via ORAL
  Filled 2015-03-13 (×2): qty 2

## 2015-03-13 MED ORDER — ALBUMIN HUMAN 25 % IV SOLN
12.5000 g | Freq: Once | INTRAVENOUS | Status: AC
  Administered 2015-03-13: 12.5 g via INTRAVENOUS
  Filled 2015-03-13: qty 50

## 2015-03-13 MED ORDER — IBUPROFEN 600 MG PO TABS
600.0000 mg | ORAL_TABLET | Freq: Three times a day (TID) | ORAL | Status: DC | PRN
  Administered 2015-03-13 – 2015-03-15 (×5): 600 mg via ORAL
  Filled 2015-03-13 (×6): qty 1

## 2015-03-13 MED ORDER — MIDODRINE HCL 5 MG PO TABS
5.0000 mg | ORAL_TABLET | Freq: Once | ORAL | Status: AC
  Administered 2015-03-13: 5 mg via ORAL

## 2015-03-13 MED ORDER — GUAIFENESIN-DM 100-10 MG/5ML PO SYRP
10.0000 mL | ORAL_SOLUTION | ORAL | Status: DC | PRN
  Administered 2015-03-13 – 2015-03-15 (×2): 10 mL via ORAL
  Filled 2015-03-13 (×2): qty 10

## 2015-03-13 NOTE — Progress Notes (Signed)
[facility] - [place] at [facility]   PATIENT NAME: Savannah Key    MR#:  [id number]  DATE OF BIRTH:  08-25-1940  SUBJECTIVE:  Every time patient coughed she has lower back pain.  REVIEW OF SYSTEMS:    Review of Systems  Constitutional: Negative for fever, chills and malaise/fatigue.  HENT: Negative for sore throat.   Eyes: Negative for blurred vision.  Respiratory: Positive for cough, shortness of breath and wheezing. Negative for hemoptysis.   Cardiovascular: Negative for chest pain, palpitations, orthopnea and leg swelling.  Gastrointestinal: Negative for nausea, vomiting, abdominal pain, diarrhea and blood in stool.  Genitourinary: Negative for dysuria.  Musculoskeletal: Positive for back pain.  Skin: Negative for itching and rash.  Neurological: Negative for dizziness, tremors and headaches.  Endo/Heme/Allergies: Does not bruise/bleed easily.    Tolerating Diet: Yes      DRUG ALLERGIES:   Allergies  Allergen Reactions  . Sulfa Antibiotics Other (See Comments)    Reaction:  Blood in the urine     VITALS:  Blood pressure 95/33, pulse 79, temperature 100.7 F (38.2 C), temperature source Oral, resp. rate 20, height 5' (1.524 m), weight 93.078 kg (205 lb 3.2 oz), SpO2 100 %.  PHYSICAL EXAMINATION:   Physical Exam  Constitutional: She is oriented to person, place, and time and well-developed, well-nourished, and in no distress. No distress.  HENT:  Head: Normocephalic.  Eyes: No scleral icterus.  Neck: Normal range of motion. Neck supple. No JVD present. No tracheal deviation present.  Cardiovascular: Normal rate, regular rhythm and normal heart sounds.  Exam reveals no gallop and no friction rub.   No murmur heard. Pulmonary/Chest: Effort normal. No respiratory distress. She has wheezes. She has rales. She exhibits no tenderness.  Abdominal: Soft. Bowel sounds are normal. She exhibits no distension and no mass. There is no tenderness.  There is no rebound and no guarding.  Musculoskeletal: Normal range of motion. She exhibits edema.  Neurological: She is alert and oriented to person, place, and time.  Skin: Skin is warm. No rash noted. No erythema.  Psychiatric: Affect and judgment normal.      LABORATORY PANEL:   CBC  Recent Labs Lab 03/12/15 0348  WBC 7.6  HGB 8.0*  HCT 26.1*  PLT 77*   ------------------------------------------------------------------------------------------------------------------  Chemistries   Recent Labs Lab 03/13/15 0737  NA 140  K 5.0  CL 99*  CO2 33*  GLUCOSE 109*  BUN 20  CREATININE 3.36*  CALCIUM 8.0*   ------------------------------------------------------------------------------------------------------------------  Cardiac Enzymes  Recent Labs Lab 03/12/15 1020 03/12/15 1538 03/13/15 0737  TROPONINI 1.67* 1.77* 1.49*   ------------------------------------------------------------------------------------------------------------------  RADIOLOGY:  Dg Chest Port 1 View  Mar 26, 2015  CLINICAL DATA:  Increased weakness and shortness of breath with cough. Upper back pain. EXAM: PORTABLE CHEST 1 VIEW COMPARISON:  03/06/2015 FINDINGS: 1900 hours The lungs are clear wiithout focal pneumonia, edema, pneumothorax or pleural effusion. There is pulmonary vascular congestion without overt pulmonary edema. The cardio pericardial silhouette is enlarged. The visualized bony structures of the thorax are intact. IMPRESSION: Stable. Enlargement of the cardiopericardial silhouette with vascular congestion. Electronically Signed   By: Kennith Center M.D.   On: 03-26-15 19:33     ASSESSMENT AND PLAN:   75 year old female with a history of end-stage renal disease on hemodialysis and combined systolic and diastolic heart failure who presents with shortness of breath and weight gain.  1. Acute on chronic combined systolic EF 40-45% and diastolic heart failure: Patient is  to continue  with dialysis for volume overload.  2. ESRD on dialysis: Patient will again have dialysis today.  3. OSA: Patient will need CPAP at night. Patient does report that she is not compliant with this. I stressed the importance of using her machine at home.  4. Type 2 diabetes: Continue sliding scale insulin and ADA diet.  5. Hypothyroid: Continue Synthroid  6. Anxiety and depression: Continue Effexor  7. Anemia of chronic disease: Hemoglobin remainsstable.  Continue Neupogen  8. Lower back pain with coughing: This is muscular skeletal. Trial of ibuprofen    Management plans discussed with the patient and she is in agreement.  CODE STATUS: FULL  TOTAL TIME TAKING CARE OF THIS PATIENT: 28 minutes.     POSSIBLE D/C 2-3 days, DEPENDING ON CLINICAL CONDITION. She will be discharged to skilled nursing facility once medical stable.  Lesa Vandall M.D on 03/13/2015 at 11:45 AM  Between 7am to 6pm - Pager - [phone] After 6pm go to [email or web address] - password [email or web address]  [facility]  Office  [phone]  CC: Primary care physician; Bobbye Morton, MD  Note: This dictation was prepared with Dragon dictation along with smaller phrase technology. Any transcriptional errors that result from this process are unintentional.

## 2015-03-13 NOTE — Progress Notes (Signed)
Patient refusing to wear Bipap tonight. I have offered twice. Patient does not wish to wear at this time.

## 2015-03-13 NOTE — Progress Notes (Signed)
bp 70/42 map 48 post giving midodrine  bp 88/32. Dr. Thedore Mins notified stated he would increase Midodrine.

## 2015-03-13 NOTE — Progress Notes (Signed)
Subjective:  Patient is known to our practice from previous admissions 2.5 L removed with HD yesterday +dry cough Reports low back pain   Objective:  Vital signs in last 24 hours:  Temp:  [98.1 F (36.7 C)-98.7 F (37.1 C)] 98.4 F (36.9 C) (02/22 0528) Pulse Rate:  [49-116] 69 (02/22 0530) Resp:  [18-24] 20 (02/22 0528) BP: (83-122)/(29-81) 96/38 mmHg (02/22 0530) SpO2:  [95 %-100 %] 95 % (02/22 0528) Weight:  [93.078 kg (205 lb 3.2 oz)-93.6 kg (206 lb 5.6 oz)] 93.078 kg (205 lb 3.2 oz) (02/22 1610)  Weight change: 3.788 kg (8 lb 5.6 oz) Filed Weights   03/12/15 0348 03/12/15 1605 03/13/15 0613  Weight: 93.668 kg (206 lb 8 oz) 93.6 kg (206 lb 5.6 oz) 93.078 kg (205 lb 3.2 oz)    Intake/Output:    Intake/Output Summary (Last 24 hours) at 03/13/15 1009 Last data filed at 03/13/15 0500  Gross per 24 hour  Intake    240 ml  Output   2550 ml  Net  -2310 ml     Physical Exam: General: Obese, laying in the bed  HEENT anicteric  Neck supple  Pulm/lungs Bilateral diffuse crackles  CVS/Heart irregular  Abdomen:  Soft, obese, nontender  Extremities: + dependent edema  Neurologic: Alert, oriented  Skin: No acute rashes  Access: AV fistula       Basic Metabolic Panel:   Recent Labs Lab 03/07/15 0411 03/08/15 1100 03/10/2015 1715 03/12/15 0348 03/13/15 0737  NA 137 132* 136 135 140  K 4.5 5.1 3.8 4.0 5.0  CL 99* 94* 97* 98* 99*  CO2 33*  GLUCOSE 168* 270* 142* 213* 109*  BUN 32* 50* 19 24* 20  CREATININE 3.96* 5.50* 3.28* 3.90* 3.36*  CALCIUM 8.0* 8.2* 7.9* 7.9* 8.0*  PHOS  --  5.8*  --   --   --      CBC:  Recent Labs Lab 03/06/15 1808 03/07/15 0100 03/07/15 0411 03/08/15 0324 03/04/2015 1715 03/12/15 0348  WBC 9.9 9.6 8.6 9.5 7.0 7.6  NEUTROABS 7.2*  --   --   --   --   --   HGB 7.6* 7.9* 7.1* 7.5* 7.8* 8.0*  HCT 24.8* 25.8* 23.1* 24.0* 26.2* 26.1*  MCV 80.0 81.3 80.8 78.4* 80.6 81.5  PLT 146* 135* 125* 123* 76* 77*       Microbiology:  Recent Results (from the past 720 hour(s))  MRSA PCR Screening     Status: None   Collection Time: 03/05/15  8:09 AM  Result Value Ref Range Status   MRSA by PCR NEGATIVE NEGATIVE Final    Comment:        The GeneXpert MRSA Assay (FDA approved for NASAL specimens only), is one component of a comprehensive MRSA colonization surveillance program. It is not intended to diagnose MRSA infection nor to guide or monitor treatment for MRSA infections.     Coagulation Studies: No results for input(s): LABPROT, INR in the last 72 hours.  Urinalysis: No results for input(s): COLORURINE, LABSPEC, PHURINE, GLUCOSEU, HGBUR, BILIRUBINUR, KETONESUR, PROTEINUR, UROBILINOGEN, NITRITE, LEUKOCYTESUR in the last 72 hours.  Invalid input(s): APPERANCEUR    Imaging: Dg Chest Port 1 View  03/10/2015  CLINICAL DATA:  Increased weakness and shortness of breath with cough. Upper back pain. EXAM: PORTABLE CHEST 1 VIEW COMPARISON:  03/06/2015 FINDINGS: 1900 hours The lungs are clear wiithout focal pneumonia, edema, pneumothorax or pleural effusion. There is pulmonary vascular congestion without overt pulmonary  edema. The cardio pericardial silhouette is enlarged. The visualized bony structures of the thorax are intact. IMPRESSION: Stable. Enlargement of the cardiopericardial silhouette with vascular congestion. Electronically Signed   By: Kennith Center M.D.   On: Apr 01, 2015 19:33     Medications:     . antiseptic oral rinse  7 mL Mouth Rinse q12n4p  . aspirin EC  81 mg Oral Daily  . calcium acetate  2,001 mg Oral Daily  . chlorhexidine  15 mL Mouth Rinse BID  . clopidogrel  75 mg Oral Daily  . heparin  5,000 Units Subcutaneous 3 times per day  . insulin aspart  0-9 Units Subcutaneous TID AC & HS  . ipratropium-albuterol  3 mL Nebulization Q4H  . levothyroxine  75 mcg Oral QAC breakfast  . midodrine  5 mg Oral Q M,W,F-HD  . mometasone-formoterol  2 puff Inhalation BID  .   morphine injection  2 mg Intravenous Once  . pantoprazole  40 mg Oral BID  . pregabalin  75 mg Oral QHS  . rOPINIRole  2 mg Oral QHS  . sodium chloride flush  3 mL Intravenous Q12H  . traZODone  50 mg Oral QHS  . venlafaxine XR  37.5 mg Oral QHS   acetaminophen **OR** acetaminophen, guaiFENesin-dextromethorphan, HYDROcodone-acetaminophen, ondansetron **OR** ondansetron (ZOFRAN) IV  Assessment/ Plan:  75 y.o. female  is a 75 y.o. white female with end-stage renal disease, carotid stenosis, coronary artery disease, diabetes type 2, history of myocardial infarction, hypertension, peripheral vascular disease.  MWF [facility] [place] [facility]  1. ESRD on HD MWF: - Will arrange for dialysis again today for volume overload - 2.5 L removed yesterday  2. Anemia of CKD: hemoglobin of 8 - continue Epogen 10,000 units IV with dialysis while inpatient.   3. Secondary Hyperparathyroidism:  - monitor phosphorus this admission  4.  Acute pulmonary edema by clinical assessment -  Dialysis today  5. Low back pain - trial of ibuprofen   LOS: 1 Yitzel Shasteen 2/22/201710:09 AM

## 2015-03-13 NOTE — Progress Notes (Signed)
Subjective:  Patient is known to our practice from previous admissions 2.5 L removed with HD yesterday +dry cough Reports low back pain   Objective:  Vital signs in last 24 hours:  Temp:  [98.1 F (36.7 C)-100.7 F (38.2 C)] 100.7 F (38.2 C) (02/22 1128) Pulse Rate:  [49-116] 79 (02/22 1130) Resp:  [18-24] 20 (02/22 1128) BP: (70-122)/(29-81) 88/32 mmHg (02/22 1200) SpO2:  [95 %-100 %] 100 % (02/22 1128) Weight:  [93.078 kg (205 lb 3.2 oz)-93.6 kg (206 lb 5.6 oz)] 93.078 kg (205 lb 3.2 oz) (02/22 1610)  Weight change: 3.788 kg (8 lb 5.6 oz) Filed Weights   03/12/15 0348 03/12/15 1605 03/13/15 0613  Weight: 93.668 kg (206 lb 8 oz) 93.6 kg (206 lb 5.6 oz) 93.078 kg (205 lb 3.2 oz)    Intake/Output:    Intake/Output Summary (Last 24 hours) at 03/13/15 1255 Last data filed at 03/13/15 0830  Gross per 24 hour  Intake    360 ml  Output   2550 ml  Net  -2190 ml     Physical Exam: General: Obese, laying in the bed  HEENT anicteric  Neck supple  Pulm/lungs Bilateral diffuse crackles  CVS/Heart irregular  Abdomen:  Soft, obese, nontender  Extremities: + dependent edema  Neurologic: Alert, oriented  Skin: No acute rashes  Access: AV fistula       Basic Metabolic Panel:   Recent Labs Lab 03/07/15 0411 03/08/15 1100 03/06/2015 1715 03/12/15 0348 03/13/15 0737  NA 137 132* 136 135 140  K 4.5 5.1 3.8 4.0 5.0  CL 99* 94* 97* 98* 99*  CO2 33*  GLUCOSE 168* 270* 142* 213* 109*  BUN 32* 50* 19 24* 20  CREATININE 3.96* 5.50* 3.28* 3.90* 3.36*  CALCIUM 8.0* 8.2* 7.9* 7.9* 8.0*  PHOS  --  5.8*  --   --   --      CBC:  Recent Labs Lab 03/06/15 1808 03/07/15 0100 03/07/15 0411 03/08/15 0324 02/28/2015 1715 03/12/15 0348  WBC 9.9 9.6 8.6 9.5 7.0 7.6  NEUTROABS 7.2*  --   --   --   --   --   HGB 7.6* 7.9* 7.1* 7.5* 7.8* 8.0*  HCT 24.8* 25.8* 23.1* 24.0* 26.2* 26.1*  MCV 80.0 81.3 80.8 78.4* 80.6 81.5  PLT 146* 135* 125* 123* 76* 77*       Microbiology:  Recent Results (from the past 720 hour(s))  MRSA PCR Screening     Status: None   Collection Time: 03/05/15  8:09 AM  Result Value Ref Range Status   MRSA by PCR NEGATIVE NEGATIVE Final    Comment:        The GeneXpert MRSA Assay (FDA approved for NASAL specimens only), is one component of a comprehensive MRSA colonization surveillance program. It is not intended to diagnose MRSA infection nor to guide or monitor treatment for MRSA infections.     Coagulation Studies: No results for input(s): LABPROT, INR in the last 72 hours.  Urinalysis: No results for input(s): COLORURINE, LABSPEC, PHURINE, GLUCOSEU, HGBUR, BILIRUBINUR, KETONESUR, PROTEINUR, UROBILINOGEN, NITRITE, LEUKOCYTESUR in the last 72 hours.  Invalid input(s): APPERANCEUR    Imaging: Dg Chest Port 1 View  02/22/2015  CLINICAL DATA:  Increased weakness and shortness of breath with cough. Upper back pain. EXAM: PORTABLE CHEST 1 VIEW COMPARISON:  03/06/2015 FINDINGS: 1900 hours The lungs are clear wiithout focal pneumonia, edema, pneumothorax or pleural effusion. There is pulmonary vascular congestion without overt pulmonary  edema. The cardio pericardial silhouette is enlarged. The visualized bony structures of the thorax are intact. IMPRESSION: Stable. Enlargement of the cardiopericardial silhouette with vascular congestion. Electronically Signed   By: Kennith Center M.D.   On: Mar 28, 2015 19:33     Medications:     . albumin human  12.5 g Intravenous Once  . antiseptic oral rinse  7 mL Mouth Rinse q12n4p  . aspirin EC  81 mg Oral Daily  . calcium acetate  2,001 mg Oral Daily  . chlorhexidine  15 mL Mouth Rinse BID  . clopidogrel  75 mg Oral Daily  . heparin  5,000 Units Subcutaneous 3 times per day  . insulin aspart  0-9 Units Subcutaneous TID AC & HS  . ipratropium-albuterol  3 mL Nebulization Q4H  . levothyroxine  75 mcg Oral QAC breakfast  . [START ON 03/04/2015] midodrine  10 mg Oral  Q M,W,F-HD  . mometasone-formoterol  2 puff Inhalation BID  .  morphine injection  2 mg Intravenous Once  . pantoprazole  40 mg Oral BID  . pregabalin  75 mg Oral QHS  . rOPINIRole  2 mg Oral QHS  . sodium chloride flush  3 mL Intravenous Q12H  . traZODone  50 mg Oral QHS  . venlafaxine XR  37.5 mg Oral QHS   acetaminophen **OR** acetaminophen, guaiFENesin-dextromethorphan, HYDROcodone-acetaminophen, ibuprofen, ondansetron **OR** ondansetron (ZOFRAN) IV  Assessment/ Plan:  75 y.o. female  is a 75 y.o. white female with end-stage renal disease, carotid stenosis, coronary artery disease, diabetes type 2, history of myocardial infarction, hypertension, peripheral vascular disease.  MWF [facility] [place]  1. ESRD on HD MWF: - Will arrange for dialysis again today for volume overload - 2.5 L removed yesterday  2. Anemia of CKD: hemoglobin of 8 - continue Epogen 10,000 units IV with dialysis while inpatient.   3. Secondary Hyperparathyroidism:  - monitor phosphorus this admission  4.  Acute pulmonary edema by clinical assessment -  Dialysis today  5. Low back pain - trial of ibuprofen   LOS: 1 Savannah Key 2/22/201712:55 PM

## 2015-03-14 ENCOUNTER — Inpatient Hospital Stay: Payer: Medicare (Managed Care)

## 2015-03-14 LAB — CBC
HEMATOCRIT: 25.9 % — AB (ref 35.0–47.0)
HEMOGLOBIN: 7.8 g/dL — AB (ref 12.0–16.0)
MCH: 24.5 pg — ABNORMAL LOW (ref 26.0–34.0)
MCHC: 30 g/dL — ABNORMAL LOW (ref 32.0–36.0)
MCV: 81.7 fL (ref 80.0–100.0)
Platelets: 72 10*3/uL — ABNORMAL LOW (ref 150–440)
RBC: 3.18 MIL/uL — ABNORMAL LOW (ref 3.80–5.20)
RDW: 22.4 % — ABNORMAL HIGH (ref 11.5–14.5)
WBC: 5 10*3/uL (ref 3.6–11.0)

## 2015-03-14 LAB — GLUCOSE, CAPILLARY
GLUCOSE-CAPILLARY: 269 mg/dL — AB (ref 65–99)
Glucose-Capillary: 199 mg/dL — ABNORMAL HIGH (ref 65–99)
Glucose-Capillary: 212 mg/dL — ABNORMAL HIGH (ref 65–99)
Glucose-Capillary: 292 mg/dL — ABNORMAL HIGH (ref 65–99)

## 2015-03-14 LAB — BASIC METABOLIC PANEL
Anion gap: 12 (ref 5–15)
BUN: 20 mg/dL (ref 6–20)
CHLORIDE: 96 mmol/L — AB (ref 101–111)
CO2: 31 mmol/L (ref 22–32)
CREATININE: 3.24 mg/dL — AB (ref 0.44–1.00)
Calcium: 8.3 mg/dL — ABNORMAL LOW (ref 8.9–10.3)
GFR calc Af Amer: 15 mL/min — ABNORMAL LOW (ref >60)
GFR calc non Af Amer: 13 mL/min — ABNORMAL LOW (ref >60)
Glucose, Bld: 236 mg/dL — ABNORMAL HIGH (ref 65–99)
Potassium: 4.4 mmol/L (ref 3.5–5.1)
SODIUM: 139 mmol/L (ref 135–145)

## 2015-03-14 NOTE — Care Management Important Message (Signed)
Important Message  Patient Details  Name: Savannah Key MRN: [id number] Date of Birth: 29-May-1940   Medicare Important Message Given:  Yes    Olegario Messier A Kenlea Woodell 03/14/2015, 12:02 PM

## 2015-03-14 NOTE — Progress Notes (Signed)
Temperature came back down, no more fever. Patient has become more alert this evening and states her back feels some better, but is still requiring medication. Dialysis scheduled for tomorrow. New Chest xray showed possible pneumonia. Will continue to monitor.

## 2015-03-14 NOTE — Progress Notes (Signed)
Pt refused Bipap, remains at bedside

## 2015-03-14 NOTE — Care Management Note (Signed)
Patient is active at [facility] on  MWF followed at the center by [facility].  Clinic is aware of hospital admission.  I will send additional medical records at discharge.  Ivor Reining Dialysis Coordinator      [phone]

## 2015-03-14 NOTE — Progress Notes (Signed)
CSW spoke to Moro with [facility] and provided her an update on patient's current status. Per Olegario Messier the plan is for patient to go to [facility] at discharge. CSW will continue to follow and assist.   Woodroe Mode, MSW, LCSW-A Clinical Social Work Department [phone]

## 2015-03-14 NOTE — Progress Notes (Signed)
[facility] [place] at [facility]   PATIENT NAME: Savannah Key    MR#:  [id number]  DATE OF BIRTH:  12/11/1940  SUBJECTIVE:  Patient had low blood pressure yesterday. Midodrine was increased. Her back pain is improved.  REVIEW OF SYSTEMS:    Review of Systems  Constitutional: Negative for fever, chills and malaise/fatigue.  HENT: Negative for sore throat.   Eyes: Negative for blurred vision.  Respiratory: Positive for cough and shortness of breath. Negative for hemoptysis and wheezing.   Cardiovascular: Negative for chest pain, palpitations, orthopnea and leg swelling.  Gastrointestinal: Negative for nausea, vomiting, abdominal pain, diarrhea and blood in stool.  Genitourinary: Negative for dysuria.  Skin: Negative for itching and rash.  Neurological: Negative for dizziness, tremors and headaches.  Endo/Heme/Allergies: Does not bruise/bleed easily.  Psychiatric/Behavioral: Positive for depression. Negative for hallucinations.    Tolerating Diet: Yes      DRUG ALLERGIES:   Allergies  Allergen Reactions  . Sulfa Antibiotics Other (See Comments)    Reaction:  Blood in the urine     VITALS:  Blood pressure 107/86, pulse 83, temperature 97.9 F (36.6 C), temperature source Oral, resp. rate 18, height 5' (1.524 m), weight 88.587 kg (195 lb 4.8 oz), SpO2 97 %.  PHYSICAL EXAMINATION:   Physical Exam  Constitutional: She is oriented to person, place, and time and well-developed, well-nourished, and in no distress. No distress.  HENT:  Head: Normocephalic.  Eyes: No scleral icterus.  Neck: Normal range of motion. Neck supple. No JVD present. No tracheal deviation present.  Cardiovascular: Normal rate, regular rhythm and normal heart sounds.  Exam reveals no gallop and no friction rub.   No murmur heard. Pulmonary/Chest: Effort normal. No respiratory distress. She has wheezes. She has rales. She exhibits no tenderness.  Abdominal: Soft. Bowel  sounds are normal. She exhibits no distension and no mass. There is no tenderness. There is no rebound and no guarding.  Musculoskeletal: Normal range of motion.  Neurological: She is alert and oriented to person, place, and time.  Skin: Skin is warm. No rash noted. No erythema.  Psychiatric: Affect and judgment normal.      LABORATORY PANEL:   CBC  Recent Labs Lab 03/14/15 0510  WBC 5.0  HGB 7.8*  HCT 25.9*  PLT 72*   ------------------------------------------------------------------------------------------------------------------  Chemistries   Recent Labs Lab 03/14/15 0510  NA 139  K 4.4  CL 96*  CO2 31  GLUCOSE 236*  BUN 20  CREATININE 3.24*  CALCIUM 8.3*   ------------------------------------------------------------------------------------------------------------------  Cardiac Enzymes  Recent Labs Lab 03/13/15 0737 03/13/15 1312 03/13/15 1916  TROPONINI 1.49* 1.34* 1.28*   ------------------------------------------------------------------------------------------------------------------  RADIOLOGY:  No results found.   ASSESSMENT AND PLAN:   75 year old female with a history of end-stage renal disease on hemodialysis and combined systolic and diastolic heart failure who presents with shortness of breath and weight gain.  1. Acute on chronic combined systolic EF 40-45% and diastolic heart failure: Patient is to continue with dialysis for volume overload. Follow up on chest x-ray. 2. ESRD on dialysis: Dialysis management as per nephrology team. Consult appreciated. She follows at [facility] nephrology as an outpatient 3. OSA: She does not use CPAP at night due to noncompliance. She may be a patient that needs to be referred to a dentist for mouthguard.  4. Type 2 diabetes: Continue sliding scale insulin and ADA diet.  5. Hypothyroid: Continue Synthroid  6. Anxiety and depression: Continue Effexor  7. Anemia of  chronic disease: Hemoglobin  stable.  Continue EPO 8. Lower back pain with coughing: Improved. Continue when necessary ibuprofen  9. Hypotension: Blood pressure improved. Continue increased as submitted ring.  Management plans discussed with the patient and she is in agreement.  CODE STATUS: FULL  TOTAL TIME TAKING CARE OF THIS PATIENT: 24 minutes.     POSSIBLE D/C 2-3 days, DEPENDING ON CLINICAL CONDITION. She will be discharged to skilled nursing facility once medical stable.  Avel Ogawa M.D on 03/14/2015 at 10:54 AM  Between 7am to 6pm - Pager - [phone] After 6pm go to [email or web address] - password [email or web address]  [facility]  Office  [phone]  CC: Primary care physician; Bobbye Morton, MD  Note: This dictation was prepared with Dragon dictation along with smaller phrase technology. Any transcriptional errors that result from this process are unintentional.

## 2015-03-14 NOTE — Progress Notes (Signed)
Subjective:  Patient is known to our practice from previous admissions 2.5 L removed with HD yesterday and the previous day +dry cough Reports low back pain   Objective:  Vital signs in last 24 hours:  Temp:  [97.9 F (36.6 C)-100 F (37.8 C)] 98.6 F (37 C) (02/23 1338) Pulse Rate:  [46-118] 63 (02/23 1126) Resp:  [18-28] 20 (02/23 1126) BP: (71-163)/(32-133) 121/107 mmHg (02/23 1126) SpO2:  [91 %-100 %] 96 % (02/23 1126) Weight:  [88.587 kg (195 lb 4.8 oz)-93.2 kg (205 lb 7.5 oz)] 88.587 kg (195 lb 4.8 oz) (02/23 0449)  Weight change: -0.4 kg (-14.1 oz) Filed Weights   03/13/15 1430 03/13/15 1910 03/14/15 0449  Weight: 93.2 kg (205 lb 7.5 oz) 89.631 kg (197 lb 9.6 oz) 88.587 kg (195 lb 4.8 oz)    Intake/Output:    Intake/Output Summary (Last 24 hours) at 03/14/15 1422 Last data filed at 03/14/15 1135  Gross per 24 hour  Intake    123 ml  Output   2500 ml  Net  -2377 ml     Physical Exam: General: Obese, laying in the bed  HEENT anicteric  Neck supple  Pulm/lungs Bilateral diffuse rhonchi  CVS/Heart irregular  Abdomen:  Soft, obese, nontender  Extremities: + dependent edema  Neurologic: Alert, oriented  Skin: No acute rashes  Access: AV fistula       Basic Metabolic Panel:   Recent Labs Lab 03/08/15 1100 Mar 22, 2015 1715 03/12/15 0348 03/13/15 0737 03/14/15 0510  NA 132* 136 135 140 139  K 5.1 3.8 4.0 5.0 4.4  CL 94* 97* 98* 99* 96*  CO2 33* 31  GLUCOSE 270* 142* 213* 109* 236*  BUN 50* 19 24* 20 20  CREATININE 5.50* 3.28* 3.90* 3.36* 3.24*  CALCIUM 8.2* 7.9* 7.9* 8.0* 8.3*  PHOS 5.8*  --   --   --   --      CBC:  Recent Labs Lab 03/08/15 0324 2015/03/22 1715 03/12/15 0348 03/14/15 0510  WBC 9.5 7.0 7.6 5.0  HGB 7.5* 7.8* 8.0* 7.8*  HCT 24.0* 26.2* 26.1* 25.9*  MCV 78.4* 80.6 81.5 81.7  PLT 123* 76* 77* 72*      Microbiology:  Recent Results (from the past 720 hour(s))  MRSA PCR Screening     Status: None   Collection Time: 03/05/15  8:09 AM  Result Value Ref Range Status   MRSA by PCR NEGATIVE NEGATIVE Final    Comment:        The GeneXpert MRSA Assay (FDA approved for NASAL specimens only), is one component of a comprehensive MRSA colonization surveillance program. It is not intended to diagnose MRSA infection nor to guide or monitor treatment for MRSA infections.     Coagulation Studies: No results for input(s): LABPROT, INR in the last 72 hours.  Urinalysis: No results for input(s): COLORURINE, LABSPEC, PHURINE, GLUCOSEU, HGBUR, BILIRUBINUR, KETONESUR, PROTEINUR, UROBILINOGEN, NITRITE, LEUKOCYTESUR in the last 72 hours.  Invalid input(s): APPERANCEUR    Imaging: Dg Chest 1 View  03/14/2015  CLINICAL DATA:  75 year old female with shortness of breath and hypotension. EXAM: CHEST 1 VIEW COMPARISON:  03/22/2015 FINDINGS: Cardiomegaly and pulmonary vascular congestion again noted. New mild interstitial and airspace opacities likely represent edema but pneumonia is not excluded. There is no evidence of pneumothorax or large pleural effusion. No acute bony abnormalities are noted. IMPRESSION: New mild interstitial and airspace opacities likely representing pulmonary edema but pneumonia is not excluded. Electronically Signed   By:  Harmon Pier M.D.   On: 03/14/2015 13:36     Medications:     . antiseptic oral rinse  7 mL Mouth Rinse q12n4p  . aspirin EC  81 mg Oral Daily  . calcium acetate  2,001 mg Oral Daily  . chlorhexidine  15 mL Mouth Rinse BID  . clopidogrel  75 mg Oral Daily  . heparin  5,000 Units Subcutaneous 3 times per day  . insulin aspart  0-9 Units Subcutaneous TID AC & HS  . ipratropium-albuterol  3 mL Nebulization Q4H  . levothyroxine  75 mcg Oral QAC breakfast  . [START ON 03/09/2015] midodrine  10 mg Oral Q M,W,F-HD  . mometasone-formoterol  2 puff Inhalation BID  .  morphine injection  2 mg Intravenous Once  . pantoprazole  40 mg Oral BID  . pregabalin  75 mg  Oral QHS  . rOPINIRole  2 mg Oral QHS  . sodium chloride flush  3 mL Intravenous Q12H  . traZODone  50 mg Oral QHS  . venlafaxine XR  37.5 mg Oral QHS   acetaminophen **OR** acetaminophen, guaiFENesin-dextromethorphan, HYDROcodone-acetaminophen, ibuprofen, ondansetron **OR** ondansetron (ZOFRAN) IV  Assessment/ Plan:  75 y.o. female  is a 75 y.o. white female with end-stage renal disease, carotid stenosis, coronary artery disease, diabetes type 2, history of myocardial infarction, hypertension, peripheral vascular disease.  MWF [facility] [place] [facility]  1. ESRD on HD MWF: - dialysis tomorrow  2. Anemia of CKD: hemoglobin of 7.8 - continue Epogen 10,000 units IV with dialysis while inpatient.   3. Secondary Hyperparathyroidism:  - monitor phosphorus this admission  4.  Acute pulmonary edema by clinical assessment - improved with fluid removal - 5 L removed so far  5. Low back pain - trial of ibuprofen   LOS: 2 Josephyne Tarter 2/23/20172:22 PM

## 2015-03-14 NOTE — Progress Notes (Signed)
Notified Dr. Juliene Pina of patient's temperature being 100.0 and that patient is sort of lethargic at times. Patient will arouse when asked questions. Still complaining of a lot of back pain. Was instructed not to give norco due to patient's hypotension right now. Dr. Juliene Pina stated it will be fine to give the norco, so will try that for pain. No new orders from temperature spike. Will continue to monitor.

## 2015-03-15 ENCOUNTER — Inpatient Hospital Stay
Admit: 2015-03-15 | Discharge: 2015-03-15 | Disposition: A | Discharge status: Home / Self Care | Payer: Medicare (Managed Care) | Attending: Pulmonary Disease | Admitting: Pulmonary Disease

## 2015-03-15 ENCOUNTER — Inpatient Hospital Stay: Payer: Medicare (Managed Care)

## 2015-03-15 DIAGNOSIS — T8059XA Anaphylactic reaction due to other serum, initial encounter: Secondary | ICD-10-CM

## 2015-03-15 DIAGNOSIS — I5023 Acute on chronic systolic (congestive) heart failure: Secondary | ICD-10-CM

## 2015-03-15 DIAGNOSIS — J9601 Acute respiratory failure with hypoxia: Secondary | ICD-10-CM

## 2015-03-15 DIAGNOSIS — N186 End stage renal disease: Secondary | ICD-10-CM

## 2015-03-15 DIAGNOSIS — J810 Acute pulmonary edema: Secondary | ICD-10-CM

## 2015-03-15 DIAGNOSIS — L8990 Pressure ulcer of unspecified site, unspecified stage: Secondary | ICD-10-CM | POA: Insufficient documentation

## 2015-03-15 DIAGNOSIS — I4891 Unspecified atrial fibrillation: Secondary | ICD-10-CM

## 2015-03-15 DIAGNOSIS — Z992 Dependence on renal dialysis: Secondary | ICD-10-CM

## 2015-03-15 LAB — BASIC METABOLIC PANEL
Anion gap: 13 (ref 5–15)
BUN: 36 mg/dL — AB (ref 6–20)
CALCIUM: 8.5 mg/dL — AB (ref 8.9–10.3)
CO2: 30 mmol/L (ref 22–32)
Chloride: 95 mmol/L — ABNORMAL LOW (ref 101–111)
Creatinine, Ser: 4.65 mg/dL — ABNORMAL HIGH (ref 0.44–1.00)
GFR calc Af Amer: 10 mL/min — ABNORMAL LOW (ref >60)
GFR, EST NON AFRICAN AMERICAN: 9 mL/min — AB (ref >60)
Glucose, Bld: 249 mg/dL — ABNORMAL HIGH (ref 65–99)
POTASSIUM: 5 mmol/L (ref 3.5–5.1)
SODIUM: 138 mmol/L (ref 135–145)

## 2015-03-15 LAB — GLUCOSE, CAPILLARY
GLUCOSE-CAPILLARY: 215 mg/dL — AB (ref 65–99)
GLUCOSE-CAPILLARY: 249 mg/dL — AB (ref 65–99)
GLUCOSE-CAPILLARY: 141 mg/dL — AB (ref 65–99)
GLUCOSE-CAPILLARY: 125 mg/dL — AB (ref 65–99)
Glucose-Capillary: 128 mg/dL — ABNORMAL HIGH (ref 65–99)

## 2015-03-15 LAB — PROCALCITONIN: Procalcitonin: 1.13 ng/mL

## 2015-03-15 LAB — PHOSPHORUS: Phosphorus: 4.6 mg/dL (ref 2.5–4.6)

## 2015-03-15 LAB — TROPONIN I: TROPONIN I: 0.85 ng/mL — AB (ref <0.031)

## 2015-03-15 MED ORDER — LEVOFLOXACIN 250 MG PO TABS: 250.0000 mg | ORAL_TABLET | ORAL | Status: DC

## 2015-03-15 MED ORDER — EPOETIN ALFA 10000 UNIT/ML IJ SOLN: 10000.0000 [IU] | INTRAMUSCULAR | Status: DC

## 2015-03-15 MED ORDER — LEVOFLOXACIN 500 MG PO TABS
500.0000 mg | ORAL_TABLET | Freq: Once | ORAL | Status: AC
  Administered 2015-03-15: 500 mg via ORAL
  Filled 2015-03-15: qty 1

## 2015-03-15 MED ORDER — LEVOFLOXACIN IN D5W 250 MG/50ML IV SOLN: 250.0000 mg | INTRAVENOUS | Status: DC

## 2015-03-15 MED ORDER — PHENYLEPHRINE HCL 10 MG/ML IJ SOLN
0.0000 ug/min | INTRAVENOUS | Status: DC
  Administered 2015-03-15: 200 ug/min via INTRAVENOUS
  Administered 2015-03-15: 100 ug/min via INTRAVENOUS
  Filled 2015-03-15 (×2): qty 4

## 2015-03-15 MED ORDER — IPRATROPIUM-ALBUTEROL 0.5-2.5 (3) MG/3ML IN SOLN
3.0000 mL | RESPIRATORY_TRACT | Status: DC | PRN
Start: 2015-03-15 — End: 2015-03-16

## 2015-03-15 MED ORDER — HEPARIN SODIUM (PORCINE) 5000 UNIT/ML IJ SOLN: 5000.0000 [IU] | Freq: Two times a day (BID) | INTRAMUSCULAR | Status: DC

## 2015-03-15 MED ORDER — PANTOPRAZOLE SODIUM 40 MG IV SOLR: 40.0000 mg | INTRAVENOUS | Status: DC

## 2015-03-15 MED ORDER — ALBUMIN HUMAN 25 % IV SOLN
25.0000 g | INTRAVENOUS | Status: AC
Start: 2015-03-15 — End: 2015-03-15
  Administered 2015-03-15: 25 g via INTRAVENOUS
  Filled 2015-03-15: qty 100

## 2015-03-15 MED ORDER — HYDRALAZINE HCL 20 MG/ML IJ SOLN
10.0000 mg | Freq: Once | INTRAMUSCULAR | Status: DC
  Filled 2015-03-15: qty 1

## 2015-03-15 MED ORDER — LEVOTHYROXINE SODIUM 100 MCG IV SOLR
35.0000 ug | Freq: Every day | INTRAVENOUS | Status: DC
  Administered 2015-03-15: 35 ug via INTRAVENOUS
  Filled 2015-03-15: qty 5

## 2015-03-15 MED ORDER — METOPROLOL TARTRATE 1 MG/ML IV SOLN
2.5000 mg | INTRAVENOUS | Status: DC | PRN
  Administered 2015-03-15: 2.5 mg via INTRAVENOUS
  Filled 2015-03-15: qty 5

## 2015-03-15 MED ORDER — NOREPINEPHRINE BITARTRATE 1 MG/ML IV SOLN
2.0000 ug/min | INTRAVENOUS | Status: DC
  Administered 2015-03-15: 15 ug/min via INTRAVENOUS
  Administered 2015-03-15: 18 ug/min via INTRAVENOUS
  Filled 2015-03-15 (×2): qty 4

## 2015-03-15 MED ORDER — INSULIN ASPART 100 UNIT/ML ~~LOC~~ SOLN
0.0000 [IU] | SUBCUTANEOUS | Status: DC
  Administered 2015-03-15 (×2): 1 [IU] via SUBCUTANEOUS
  Filled 2015-03-15 (×2): qty 1

## 2015-03-15 MED ORDER — FUROSEMIDE 10 MG/ML IJ SOLN
80.0000 mg | Freq: Once | INTRAMUSCULAR | Status: AC
  Administered 2015-03-15: 80 mg via INTRAVENOUS
  Filled 2015-03-15: qty 8

## 2015-03-20 NOTE — Consult Note (Signed)
[facility] Cardiology Consultation Note  Patient ID: Savannah Key, MRN: [id number], DOB/AGE: 74-Aug-1942 74 y.o. Admit date: 03/03/2015   Date of Consult: April 08, 2015 Primary Physician: Bobbye Morton, MD Primary Cardiologist: None  Chief Complaint:  Chief Complaint  Patient presents with  . Weakness  . Cough  . Shortness of Breath   Reason for Consult: elevated troponin  HPI: 75 y.o. female with known end-stage renal disease on dialysis with obstructive sleep apnea and chronic systolic dysfunction congestive heart failure with coronary artery disease who is had acute onset of congestive heart failure and increased intravascular volume causing shortness of breath and hypoxia. The patient has then had multiple dialysis runs which is not significantly helped and now is hypotensive. The patient has now some tachycardia concerning for acute on chronic combined systolic diastolic heart failure with an elevated troponin of 1.4 consistent with possible demand ischemia versus acute non-ST elevation myocardial infarction. There has been no evidence of other acute syndrome suggesting chest discomfort although the patient is severely short of breath and weak and fatigue. The patient does have a low blood pressure at this time now slightly supported via levo fed. The patient is unable to take any oral medications for heart rate control at this time and may need further intervention  Past Medical History  Diagnosis Date  . Chronic kidney disease   . GERD (gastroesophageal reflux disease)   . Carotid artery stenosis   . Coronary artery disease   . Hypothyroidism   . Diabetes mellitus without complication (HCC)   . Depression   . Arthritis   . Kidney failure   . Liver failure (HCC)   . MI (myocardial infarction) (HCC)   . HTN (hypertension)   . Broken leg     R  . CHF (congestive heart failure) [facility])       Surgical History:  Past Surgical History  Procedure Laterality Date  . Av fistula  placement Left   . Cholecystectomy    . Peripheral vascular catheterization N/A 06/11/2014    Procedure: A/V Shuntogram/Fistulagram;  Surgeon: Annice Needy, MD;  Location: [facility] INVASIVE CV LAB;  Service: Cardiovascular;  Laterality: N/A;  . Peripheral vascular catheterization Left 06/11/2014    Procedure: A/V Shunt Intervention;  Surgeon: Annice Needy, MD;  Location: [facility] INVAS[facility] CV [facility];  Service: Cardiovascular;  Laterality: Left;  . Open reduction internal fixation (orif) distal radial fracture Left 08/09/2014    Procedure: OPEN REDUCTION INTERNAL FIXATION (ORIF) DISTAL RADIAL FRACTURE;  Surgeon: Kennedy Bucker, MD;  Location: [facility] ORS;  Service: Orthopedics;  Laterality: Left;  . Peripheral vascular catheterization Left 11/06/2014    Procedure: A/V Shuntogram/Fistulagram;  Surgeon: Renford Dills, MD;  Location: [facility] INV[facility] CV [facility];  Service: Cardiovascular;  Laterality: Left;  . Peripheral vascular catheterization Left 11/06/2014    Procedure: A/V Shunt Intervention;  Surgeon: Renford Dills, MD;  Location: [facility];  Service: Cardiovascular;  Laterality: Left;  . Peripheral vascular catheterization Left 01/15/2015    Procedure: A/V Shuntogram/Fistulagram;  Surgeon: Renford Dills, MD;  Location: [facility] CV [facility];  Service: Cardiovascular;  Laterality: Left;  . Peripheral vascular catheterization N/A 01/15/2015    Procedure: A/V Shunt Intervention;  Surgeon: Renford Dills, MD;  Location: [facility] CV [facility];  Service: Cardiovascular;  Laterality: N/A;     Home Meds: Prior to Admission medications   Medication Sig Start Date End Date Taking? Authorizing Provider  acetaminophen (TYLENOL) 325 MG tablet Take 2 tablets (  650 mg total) by mouth every 6 (six) hours as needed for mild pain (or Fever >/= 101). 03/08/15  Yes Ramonita Lab, MD  albuterol (PROVENTIL HFA;VENTOLIN HFA) 108 (90 Base) MCG/ACT inhaler Inhale 2 puffs into the lungs every 4 (four) hours as needed for  shortness of breath.   Yes Historical Provider, MD  alendronate (FOSAMAX) 70 MG tablet Take 70 mg by mouth once a week. Take with a full glass of water on an empty stomach. Patient takes on Monday morning   Yes Historical Provider, MD  aspirin EC 81 MG tablet Take 81 mg by mouth daily.    Yes Historical Provider, MD  benzonatate (TESSALON) 100 MG capsule Take 100 mg by mouth 3 (three) times daily as needed for cough.   Yes Historical Provider, MD  budesonide (PULMICORT) 0.25 MG/2ML nebulizer solution Take 2 mLs (0.25 mg total) by nebulization 2 (two) times daily. 12/19/14  Yes Alford Highland, MD  calcium acetate (PHOSLO) 667 MG capsule Take 3 capsules (2,001 mg total) by mouth daily. 03/08/15  Yes Ramonita Lab, MD  Carboxymethylcellul-Glycerin 0.5-0.9 % SOLN Place 1 drop into both eyes 3 (three) times daily.   Yes Historical Provider, MD  cetirizine (ZYRTEC) 5 MG tablet Take 5 mg by mouth at bedtime.    Yes Historical Provider, MD  clopidogrel (PLAVIX) 75 MG tablet Take 1 tablet (75 mg total) by mouth daily. 10/04/14  Yes Srikar Sudini, MD  fluticasone (FLONASE) 50 MCG/ACT nasal spray Place 2 sprays into both nostrils daily.   Yes Historical Provider, MD  Fluticasone Furoate-Vilanterol (BREO ELLIPTA) 100-25 MCG/INH AEPB Inhale 1 puff into the lungs daily. 01/10/15 01/11/16 Yes Erin Fulling, MD  glucose 4 GM chewable tablet Chew 1 tablet by mouth as needed for low blood sugar.   Yes Historical Provider, MD  insulin aspart (NOVOLOG FLEXPEN) 100 UNIT/ML FlexPen Inject 12 Units into the skin 3 (three) times daily with meals.   Yes Historical Provider, MD  insulin glargine (LANTUS) 100 UNIT/ML injection Inject 0.45 mLs (45 Units total) into the skin 2 (two) times daily. Patient taking differently: Inject 42 Units into the skin 2 (two) times daily.  12/19/14  Yes Richard Renae Gloss, MD  ipratropium-albuterol (DUONEB) 0.5-2.5 (3) MG/3ML SOLN Take 3 mLs by nebulization every 4 (four) hours as needed. 12/19/14  Yes  Alford Highland, MD  isosorbide mononitrate (IMDUR) 60 MG 24 hr tablet Take 60 mg by mouth daily.    Yes Historical Provider, MD  levothyroxine (SYNTHROID, LEVOTHROID) 75 MCG tablet Take 75 mcg by mouth daily.   Yes Historical Provider, MD  lidocaine (LIDODERM) 5 % Place 1 patch onto the skin daily. Remove & Discard patch within 12 hours or as directed by MD   Yes Historical Provider, MD  loperamide (IMODIUM) 2 MG capsule Take 2-4 mg by mouth as needed for diarrhea or loose stools.    Yes Historical Provider, MD  midodrine (PROAMATINE) 5 MG tablet Take 1 tablet (5 mg total) by mouth 3 (three) times daily with meals. 03/06/15  Yes Houston Siren, MD  mineral oil-hydrophilic petrolatum (AQUAPHOR) ointment Apply 1 application topically as needed for dry skin.   Yes Historical Provider, MD  nitroGLYCERIN (NITROSTAT) 0.4 MG SL tablet Place 0.4 mg under the tongue every 5 (five) minutes as needed for chest pain.   Yes Historical Provider, MD  Nutritional Supplements (FEEDING SUPPLEMENT, NEPRO CARB STEADY,) LIQD Take 237 mLs by mouth daily.   Yes Historical Provider, MD  pantoprazole (PROTONIX) 40 MG tablet  Take 40 mg by mouth 2 (two) times daily.   Yes Historical Provider, MD  pregabalin (LYRICA) 75 MG capsule Take 75 mg by mouth at bedtime.   Yes Historical Provider, MD  psyllium (METAMUCIL SMOOTH TEXTURE) 28 % packet Take 1 packet by mouth daily.   Yes Historical Provider, MD  ranitidine (ZANTAC) 150 MG tablet Take 150 mg by mouth at bedtime.   Yes Historical Provider, MD  rOPINIRole (REQUIP) 2 MG tablet Take 2 mg by mouth at bedtime.   Yes Historical Provider, MD  traZODone (DESYREL) 50 MG tablet Take 50 mg by mouth at bedtime.   Yes Historical Provider, MD  triamcinolone (KENALOG) 0.1 % paste Use as directed 1 application in the mouth or throat at bedtime as needed (gum irritation).   Yes Historical Provider, MD  trolamine salicylate (ASPERCREME) 10 % cream Apply 1 application topically 4 (four) times  daily.   Yes Historical Provider, MD  Vaginal Lubricant (REPLENS) GEL Place 1 application vaginally as needed (vaginal dryness).   Yes Historical Provider, MD  venlafaxine XR (EFFEXOR-XR) 37.5 MG 24 hr capsule Take 37.5 mg by mouth at bedtime.   Yes Historical Provider, MD    Inpatient Medications:  . antiseptic oral rinse  7 mL Mouth Rinse q12n4p  . aspirin EC  81 mg Oral Daily  . calcium acetate  2,001 mg Oral Daily  . chlorhexidine  15 mL Mouth Rinse BID  . clopidogrel  75 mg Oral Daily  . [START ON 03/18/2015] epoetin (EPOGEN/PROCRIT) injection  10,000 Units Intravenous Q M,W,F-HD  . heparin  5,000 Units Subcutaneous Q12H  . insulin aspart  0-9 Units Subcutaneous 6 times per day  . [START ON 03/17/2015] levofloxacin (LEVAQUIN) IV  250 mg Intravenous Q48H  . levothyroxine  35 mcg Intravenous Daily  . [START ON 03/16/2015] pantoprazole (PROTONIX) IV  40 mg Intravenous Q24H  . pregabalin  75 mg Oral QHS  . rOPINIRole  2 mg Oral QHS  . sodium chloride flush  3 mL Intravenous Q12H  . traZODone  50 mg Oral QHS  . venlafaxine XR  37.5 mg Oral QHS   . norepinephrine (LEVOPHED) Adult infusion 10 mcg/min (02/21/2015 1539)  . phenylephrine (NEO-SYNEPHRINE) Adult infusion 100 mcg/min (03/16/2015 1505)    Allergies:  Allergies  Allergen Reactions  . Sulfa Antibiotics Other (See Comments)    Reaction:  Blood in the urine     Social History   Social History  . Marital Status: Married    Spouse Name: N/A  . Number of Children: N/A  . Years of Education: N/A   Occupational History  . Not on file.   Social History Main Topics  . Smoking status: Never Smoker   . Smokeless tobacco: Never Used  . Alcohol Use: No  . Drug Use: No  . Sexual Activity: Not on file   Other Topics Concern  . Not on file   Social History Narrative     Family History  Problem Relation Age of Onset  . Hypertension Father   . Heart attack Mother   . Diabetes Father   . Cancer Father      Review of  Systems  Cannot assess due to patient unable to give information clearly  Labs:  Recent Labs  03/13/15 0737 03/13/15 1312 03/13/15 1916  TROPONINI 1.49* 1.34* 1.28*   Lab Results  Component Value Date   WBC 5.0 03/14/2015   HGB 7.8* 03/14/2015   HCT 25.9* 03/14/2015   MCV 81.7 03/14/2015  PLT 72* 03/14/2015    Recent Labs Lab 2015/03/27 0533  NA 138  K 5.0  CL 95*  CO2 30  BUN 36*  CREATININE 4.65*  CALCIUM 8.5*  GLUCOSE 249*   Lab Results  Component Value Date   CHOL 102 03/07/2013   HDL 43 03/07/2013   LDLCALC 33 03/07/2013   TRIG 128 03/07/2013   No results found for: DDIMER  Radiology/Studies:  Dg Chest 1 View  03/14/2015  CLINICAL DATA:  75 year old female with shortness of breath and hypotension. EXAM: CHEST 1 VIEW COMPARISON:  03/10/2015 FINDINGS: Cardiomegaly and pulmonary vascular congestion again noted. New mild interstitial and airspace opacities likely represent edema but pneumonia is not excluded. There is no evidence of pneumothorax or large pleural effusion. No acute bony abnormalities are noted. IMPRESSION: New mild interstitial and airspace opacities likely representing pulmonary edema but pneumonia is not excluded. Electronically Signed   By: Harmon Pier M.D.   On: 03/14/2015 13:36   Dg Chest Port 1 View  Mar 27, 2015  CLINICAL DATA:  Central line placement EXAM: PORTABLE CHEST 1 VIEW COMPARISON:  03/14/2015 FINDINGS: 1124 hours. Right IJ central line tip overlies the mid right atrium. No evidence for pneumothorax. The cardio pericardial silhouette is enlarged. There is pulmonary vascular congestion without overt pulmonary edema. Bilateral patchy airspace disease with a central predominance persist. The visualized bony structures of the thorax are intact. Telemetry leads overlie the chest. IMPRESSION: Right IJ central line tip overlies the mid right atrium. No evidence of pneumothorax. Cardiomegaly with pulmonary edema, as before. Electronically Signed    By: Kennith Center M.D.   On: 03/27/2015 11:46   Dg Chest Port 1 View  03/18/2015  CLINICAL DATA:  Increased weakness and shortness of breath with cough. Upper back pain. EXAM: PORTABLE CHEST 1 VIEW COMPARISON:  03/06/2015 FINDINGS: 1900 hours The lungs are clear wiithout focal pneumonia, edema, pneumothorax or pleural effusion. There is pulmonary vascular congestion without overt pulmonary edema. The cardio pericardial silhouette is enlarged. The visualized bony structures of the thorax are intact. IMPRESSION: Stable. Enlargement of the cardiopericardial silhouette with vascular congestion. Electronically Signed   By: Kennith Center M.D.   On: 03/03/2015 19:33   Dg Chest Portable 1 View  03/06/2015  CLINICAL DATA:  Labored breathing. EXAM: PORTABLE CHEST 1 VIEW COMPARISON:  12/11/2014.  Chest CT from 12/11/2014. FINDINGS: 1923 hours. The cardio pericardial silhouette is enlarged. No pulmonary edema or focal airspace consolidation. No evidence for pleural effusion. Scattered nodular opacity seen on the previous chest CT are not evident on today's x-ray. The visualized bony structures of the thorax are intact. IMPRESSION: Stable compared to prior chest x-ray. Cardiomegaly without overt pulmonary edema or focal airspace consolidation. Electronically Signed   By: Kennith Center M.D.   On: 03/06/2015 19:39    EKG: Atrial tachycardia  Weights: Filed Weights   03/27/2015 0539 03-27-2015 0945 03-27-2015 1342  Weight: 200 lb 3.2 oz (90.81 kg) 201 lb 1 oz (91.2 kg) 198 lb 13.7 oz (90.2 kg)     Physical Exam: Blood pressure 76/56, pulse 141, temperature 98.6 F (37 C), temperature source Axillary, resp. rate 36, height 5' (1.524 m), weight 198 lb 13.7 oz (90.2 kg), SpO2 90 %. Body mass index is 38.84 kg/(m^2). General: Well developed, well nourished, in some respiratory acute distress. Head eyes ears nose throat: Normocephalic, atraumatic, sclera non-icteric, no xanthomas, nares are without discharge. No apparent  thyromegaly and/or mass  Lungs: Normal respiratory effort.  Diffuse wheezes, few rales,  some rhonchi.  Heart: RRR with normal S1 S2. no murmur gallop, no rub, PMI is normal size and placement, carotid upstroke normal without bruit, jugular venous pressure is normal Abdomen: Soft, non-tender, non-distended with normoactive bowel sounds. No hepatomegaly. No rebound/guarding. No obvious abdominal masses. Abdominal aorta is normal size   Extremities: Positive edema. no cyanosis, no clubbing, no ulcers  Peripheral : 2+ bilateral upper extremity pulses, 2+ bilateral femoral pulses, 2+ bilateral dorsal pedal pulse Neuro: Alert and oriented. No facial asymmetry. No focal deficit. Moves all extremities spontaneously. Musculoskeletal: Normal muscle tone without kyphosis     Assessment: 75 year old female with acute on chronic systolic dysfunction congestive heart failure now with hypotension elevated troponin consistent with non-ST elevation myocardial infarction versus demand ischemia sleep apnea and chronic kidney disease stage III not responding well to current treatment  Plan: 1. Continue levo fed for pressure support 2. Patient to have beta blocker intravenously although would consider the possibility of oral medication if patient could take oral medication for heart rate control 3. Continue dialysis as necessary 4. BiPAP machine for hypoxia and shortness of breath with better perfusion 5. Further consideration of echocardiogram for worsening valvular heart disease or cardiomyopathy contributing to above 6. Further diagnostic testing and treatment options after above   Signed, Lamar Blinks M.D. [facility] Card[facility] 03/14/2015, 3:59 PM

## 2015-03-20 NOTE — Progress Notes (Signed)
Dr Coral Else aware of patients blood pressure and heart rate/rhythm. Oxygen not picking up as well. He placed her on venti-mask and gave patient the option to ask for BIPAP PRN. Ordering neo and metoprolol. He spoke with family and they will have family meeting regarding possible hospice care tomorrow in the am.

## 2015-03-20 NOTE — Consult Note (Signed)
PULMONARY / CRITICAL CARE MEDICINE   Name: Savannah Key MRN: [id number] DOB: 09-May-1940    ADMISSION DATE:  03-14-2015 CONSULTATION DATE:  02/24  PT PROFILE: 72 F with ESRD, DM2, CAD, CHF (LVEF 40-45% Nov, 2016), OSA admitted 02/20 with increased dyspnea and weight gain. Her CXR had edema appearance and BNP was elevated. Admission dx was pulmonary edema. She was later diagnosed with PNA and antibiotics were added. Her cardiac biomarkers were elevated (peak trop I 1.67 on 2/21). She transferred to ICU/SDU 02/24 for worsening hypoxic respiratory failure and hypotension requiring BiPAP and vasopressors. Pt is DNR/DNI  MAJOR EVENTS/TEST RESULTS: 2/20 Admitted 2/20 troponin I elevated 2/24 transferred to ICU/SDU for BiPAP and vasopressors 2/24 TTE:   INDWELLING DEVICES:: R IJ CVL 2/24 >>   MICRO DATA: None  ANTIMICROBIALS:  Levofloxacin 02/24 >>   HISTORY OF PRESENT ILLNESS:   Summary as above. Admission H&P, hospital records and database reviewed  PAST MEDICAL HISTORY :  She  has a past medical history of Chronic kidney disease; GERD (gastroesophageal reflux disease); Carotid artery stenosis; Coronary artery disease; Hypothyroidism; Diabetes mellitus without complication (HCC); Depression; Arthritis; Kidney failure; Liver failure (HCC); MI (myocardial infarction) (HCC); HTN (hypertension); Broken leg; and CHF (congestive heart failure) (HCC).  PAST SURGICAL HISTORY: She  has past surgical history that includes AV fistula placement (Left); Cholecystectomy; Cardiac catheterization (N/A, 06/11/2014); Cardiac catheterization (Left, 06/11/2014); Open reduction internal fixation (orif) distal radial fracture (Left, 08/09/2014); Cardiac catheterization (Left, 11/06/2014); Cardiac catheterization (Left, 11/06/2014); Cardiac catheterization (Left, 01/15/2015); and Cardiac catheterization (N/A, 01/15/2015).  Allergies  Allergen Reactions  . Sulfa Antibiotics Other (See Comments)    Reaction:   Blood in the urine     No current facility-administered medications on file prior to encounter.   Current Outpatient Prescriptions on File Prior to Encounter  Medication Sig  . acetaminophen (TYLENOL) 325 MG tablet Take 2 tablets (650 mg total) by mouth every 6 (six) hours as needed for mild pain (or Fever >/= 101).  Marland Kitchen albuterol (PROVENTIL HFA;VENTOLIN HFA) 108 (90 Base) MCG/ACT inhaler Inhale 2 puffs into the lungs every 4 (four) hours as needed for shortness of breath.  Marland Kitchen alendronate (FOSAMAX) 70 MG tablet Take 70 mg by mouth once a week. Take with a full glass of water on an empty stomach. Patient takes on Monday morning  . aspirin EC 81 MG tablet Take 81 mg by mouth daily.   . benzonatate (TESSALON) 100 MG capsule Take 100 mg by mouth 3 (three) times daily as needed for cough.  . budesonide (PULMICORT) 0.25 MG/2ML nebulizer solution Take 2 mLs (0.25 mg total) by nebulization 2 (two) times daily.  . calcium acetate (PHOSLO) 667 MG capsule Take 3 capsules (2,001 mg total) by mouth daily.  . Carboxymethylcellul-Glycerin 0.5-0.9 % SOLN Place 1 drop into both eyes 3 (three) times daily.  . cetirizine (ZYRTEC) 5 MG tablet Take 5 mg by mouth at bedtime.   . clopidogrel (PLAVIX) 75 MG tablet Take 1 tablet (75 mg total) by mouth daily.  . fluticasone (FLONASE) 50 MCG/ACT nasal spray Place 2 sprays into both nostrils daily.  . Fluticasone Furoate-Vilanterol (BREO ELLIPTA) 100-25 MCG/INH AEPB Inhale 1 puff into the lungs daily.  Marland Kitchen glucose 4 GM chewable tablet Chew 1 tablet by mouth as needed for low blood sugar.  . insulin aspart (NOVOLOG FLEXPEN) 100 UNIT/ML FlexPen Inject 12 Units into the skin 3 (three) times daily with meals.  . insulin glargine (LANTUS) 100 UNIT/ML injection Inject 0.45 mLs (  45 Units total) into the skin 2 (two) times daily. (Patient taking differently: Inject 42 Units into the skin 2 (two) times daily. )  . ipratropium-albuterol (DUONEB) 0.5-2.5 (3) MG/3ML SOLN Take 3 mLs by  nebulization every 4 (four) hours as needed.  . isosorbide mononitrate (IMDUR) 60 MG 24 hr tablet Take 60 mg by mouth daily.   Marland Kitchen levothyroxine (SYNTHROID, LEVOTHROID) 75 MCG tablet Take 75 mcg by mouth daily.  Marland Kitchen lidocaine (LIDODERM) 5 % Place 1 patch onto the skin daily. Remove & Discard patch within 12 hours or as directed by MD  . loperamide (IMODIUM) 2 MG capsule Take 2-4 mg by mouth as needed for diarrhea or loose stools.   . midodrine (PROAMATINE) 5 MG tablet Take 1 tablet (5 mg total) by mouth 3 (three) times daily with meals.  . mineral oil-hydrophilic petrolatum (AQUAPHOR) ointment Apply 1 application topically as needed for dry skin.  Marland Kitchen nitroGLYCERIN (NITROSTAT) 0.4 MG SL tablet Place 0.4 mg under the tongue every 5 (five) minutes as needed for chest pain.  . Nutritional Supplements (FEEDING SUPPLEMENT, NEPRO CARB STEADY,) LIQD Take 237 mLs by mouth daily.  . pantoprazole (PROTONIX) 40 MG tablet Take 40 mg by mouth 2 (two) times daily.  . pregabalin (LYRICA) 75 MG capsule Take 75 mg by mouth at bedtime.  . psyllium (METAMUCIL SMOOTH TEXTURE) 28 % packet Take 1 packet by mouth daily.  . ranitidine (ZANTAC) 150 MG tablet Take 150 mg by mouth at bedtime.  Marland Kitchen rOPINIRole (REQUIP) 2 MG tablet Take 2 mg by mouth at bedtime.  . traZODone (DESYREL) 50 MG tablet Take 50 mg by mouth at bedtime.  . triamcinolone (KENALOG) 0.1 % paste Use as directed 1 application in the mouth or throat at bedtime as needed (gum irritation).  . trolamine salicylate (ASPERCREME) 10 % cream Apply 1 application topically 4 (four) times daily.  . Vaginal Lubricant (REPLENS) GEL Place 1 application vaginally as needed (vaginal dryness).  . venlafaxine XR (EFFEXOR-XR) 37.5 MG 24 hr capsule Take 37.5 mg by mouth at bedtime.    FAMILY HISTORY:  Her has no family status information on file.   SOCIAL HISTORY: She  reports that she has never smoked. She has never used smokeless tobacco. She reports that she does not drink  alcohol or use illicit drugs.  REVIEW OF SYSTEMS:   Unable to obtain due to respiratory distress on BiPAP  SUBJECTIVE:    VITAL SIGNS: BP 92/61 mmHg  Pulse 121  Temp(Src) 98.6 F (37 C) (Axillary)  Resp 27  Ht 5' (1.524 m)  Wt 198 lb 13.7 oz (90.2 kg)  BMI 38.84 kg/m2  SpO2 91%  HEMODYNAMICS:    VENTILATOR SETTINGS: Vent Mode:  [-]  FiO2 (%):  [60 %] 60 %  INTAKE / OUTPUT: I/O last 3 completed shifts: In: 363 [P.O.:360; I.V.:3] Out: 0   PHYSICAL EXAMINATION: General: Chronically ill appearing, Moderate resp distress on BiPAP, cognition appears intact, RASS 0 Neuro:  CNs, motor, sens, DTRs all WNL HEENT: NCAT, poor dentition Cardiovascular:  Tachy, IRIR, no M - AFRVR on telemetry Lungs: coarse without wheezes, dependent crackles Abdomen: Soft, NT, +BS Ext: cool, diminished peripheral pulses, no LE edema  LABS:  BMET  Recent Labs Lab 03/13/15 0737 03/14/15 0510 03/05/2015 0533  NA 140 139 138  K 5.0 4.4 5.0  CL 99* 96* 95*  CO2 33* 31 30  BUN 20 20 36*  CREATININE 3.36* 3.24* 4.65*  GLUCOSE 109* 236* 249*  Electrolytes  Recent Labs Lab 03/13/15 0737 03/14/15 0510 03/13/2015 0533  CALCIUM 8.0* 8.3* 8.5*  PHOS  --   --  4.6    CBC  Recent Labs Lab 15-Mar-2015 1715 03/12/15 0348 03/14/15 0510  WBC 7.0 7.6 5.0  HGB 7.8* 8.0* 7.8*  HCT 26.2* 26.1* 25.9*  PLT 76* 77* 72*    Coag's No results for input(s): APTT, INR in the last 168 hours.  Sepsis Markers No results for input(s): LATICACIDVEN, PROCALCITON, O2SATVEN in the last 168 hours.  ABG No results for input(s): PHART, PCO2ART, PO2ART in the last 168 hours.  Liver Enzymes No results for input(s): AST, ALT, ALKPHOS, BILITOT, ALBUMIN in the last 168 hours.  Cardiac Enzymes  Recent Labs Lab 03/13/15 0737 03/13/15 1312 03/13/15 1916  TROPONINI 1.49* 1.34* 1.28*    Glucose  Recent Labs Lab 03/14/15 1134 03/14/15 1613 03/14/15 1945 02/25/2015 0735 02/22/2015 0936  03/18/2015 1353  GLUCAP 212* 292* 269* 215* 249* 128*    CXR: edema pattern persists   ASSESSMENT / PLAN:  PULMONARY A: Acute hypoxic respiratory failure Pulmonary edema pattern on CXR Possible overlying PNA OSA P:   Supplemental O2 to maintain SpO2 > 90% PRN BiPAP for respiratory distress BiPAP scheduled @ night for OSA DNI  CARDIOVASCULAR A:  Recent AMI Ischemic cardiomyopathy - prior LVEF 40-45% AFRVR - new onset (or not previously documented) Shock - cardiogenic +/- septic P:  MAP goal 60 mmHg Transition from norepi to phenylephrine due to tachycardia Low dose metoprolol to maintain HR < 115/min Recheck trop I now and in AM 02/25 Recheck echocardiogram Cards following DNR  RENAL A:   ESRD P:   Monitor BMET intermittently Monitor I/Os Correct electrolytes as indicated Renal service managing HD Would favor daily HD for now due to critical illness and suspected pulmonary edema  GASTROINTESTINAL A:   Dysphagia due to severe dyspnea P:   NPO for now Mds adjusted to IV where able  HEMATOLOGIC A:   Anemia without acute blood loss  Thrombocytopenia, chronic - baseline plt 100k P:  DVT px: SQ heparin Monitor CBC intermittently Transfuse per usual guidelines - goal Hgb > 8.0 Consider one unit RBCs 2/25  INFECTIOUS A:   Possible PNA P:   Monitor temp, WBC count Micro and abx as above  ENDOCRINE A:   DM 2 - inadequate control Hypothyroidism P:   Change SSI to q 4 hrs (sens scale) while NPO Change L-thyroxine to IV while NPO Check TSH AM 02/25  NEUROLOGIC A:   No acute issues P:   RASS goal: 0 Low dose morphine in event of intractable dyspnea   FAMILY  - Updates: step mother and friend updated @ [facility]   CCM time: 40 mins  The above time includes time spent in consultation with patient and/or family members and reviewing care plan on multidisciplinary rounds  Billy Fischer, MD PCCM service Mobile [phone] Pager [phone]      03/08/2015, 2:55 PM

## 2015-03-20 NOTE — Progress Notes (Addendum)
Respiratory entered room with staff in room and stated bipap had come disconnected (the hose from the face mask). Do not know when pt became disconnected because this writer had repeatedly reconnected bipap and charge reconnected x 1 when patient kept pulling bipap off. Discussed this with Neurosurgeon. Called to Dr. Elpidio Anis that patient did not have a pulse and wanted him to pronounce the patient dead. Order was entered for NMP but MD had to come to the floor to pronounce.

## 2015-03-20 NOTE — Progress Notes (Signed)
Pt is asystole. Pt not responding to sternal rub. RN at bedside and unable to doppler pulse.

## 2015-03-20 NOTE — Progress Notes (Signed)
Spoke with Dr. Ardyth Man. Per Dr Juliene Pina consulted him. BP in the 70's. Patient is currently getting dialysis and on Bipap. MD will come to evaluate patient.

## 2015-03-20 NOTE — Progress Notes (Signed)
*  PRELIMINARY RESULTS* Echocardiogram 2D Echocardiogram has been performed.  Savannah Key 04/03/15, 8:25 PM

## 2015-03-20 NOTE — Progress Notes (Signed)
Inpatient Diabetes Program Recommendations  AACE/ADA: New Consensus Statement on Inpatient Glycemic Control (2015)  Target Ranges:  Prepandial:   less than 140 mg/dL      Peak postprandial:   less than 180 mg/dL (1-2 hours)      Critically ill patients:  140 - 180 mg/dL   Review of Glycemic Control  Results for Savannah Key, Savannah Key (MRN [id number]) as of 04-02-2015 09:45  Ref. Range 03/14/2015 11:34 03/14/2015 16:13 03/14/2015 19:45 04/02/15 07:35 April 02, 2015 09:36  Glucose-Capillary Latest Ref Range: 65-99 mg/dL 409 (H) 811 (H) 914 (H) 215 (H) 249 (H)   Diabetes history: Type 2 Outpatient Diabetes medications: Novolog 12 units tid, Lantus 42 units bid Current orders for Inpatient glycemic control: Novolog 0-9 units tid and hs  Inpatient Diabetes Program Recommendations: Blood sugars beginning to elevate- consider Lantus 10 units starting now then q12h.    Consider changing Novolog correction to q6h - spreading the Novolog insulin throughout the 24 hour period will decrease the chance of hypoglycemia.   Susette Racer, RN, BA, MHA, CDE Diabetes Coordinator Inpatient Diabetes Program  [phone] (Team Pager) [phone] [facility] Office) 04/02/2015 9:59 AM

## 2015-03-20 NOTE — Progress Notes (Signed)
Family arrived at hospital and Dr. Elpidio Anis spoke with family.  Donor Services was called and patient was declined due to age and history. Spoke to [employer]. The reference number is [phone].

## 2015-03-20 NOTE — Progress Notes (Addendum)
Patient SPO2 88% on bipap. Respiratory in room and placed patient on venti mask at 50% SPO2 inreased to 90-94%. Ronchi and weezing throughout all bases of lungs. Scheduled for dialysis today. Dr. Thedore Mins notified of patients respiratory status and bp still sustaining around 70/30's. Dr. Thedore Mins suggested to transfer patient to step down on bipap and that he would order dialysis at the bedside. Dr. Juliene Pina notified and orders for transfer were obtained. Report called to [employer]. Pt transferred to CCU 4 on venti mask at 50%.

## 2015-03-20 NOTE — Progress Notes (Signed)
Found pt with Bipap disconnected and Bipap silenced. RN at bedside.

## 2015-03-20 NOTE — Progress Notes (Signed)
Hemodialysis start 

## 2015-03-20 NOTE — Progress Notes (Signed)
Dr. Elpidio Anis came to the floor to pronounce patient dead. Family was called and message was left to call floor. Family (son Leavy Cella)  finally called back and was told to come to the hospital. Tried to contact daughter and husband but unable to reach other family. Roque Cash to contact the rest of the family to come to the hospital.

## 2015-03-20 NOTE — Progress Notes (Signed)
Spoke with Dr Gwen Pounds patients  Heart rate going into 170's VTAC, afib. He will review her chart. At this time no new orders to be given.

## 2015-03-20 NOTE — Progress Notes (Signed)
Pharmacy Antibiotic Note  Savannah Key is a 75 y.o. female admitted on March 29, 2015 with pneumonia and CAP.  Pharmacy has been consulted for levofloxacin dosing.  Plan: Patient with ESRD requiring HD. The dose of levofloxacin will be adjusted to 500 mg po once then 250 mg po q48h based on renal function.  Height: 5' (152.4 cm) Weight: 200 lb 3.2 oz (90.81 kg) IBW/kg (Calculated) : 45.5  Temp (24hrs), Avg:98.8 F (37.1 C), Min:98.4 F (36.9 C), Max:100 F (37.8 C)   Recent Labs Lab March 29, 2015 1715 03/12/15 0348 03/13/15 0737 03/14/15 0510 03/13/2015 0533  WBC 7.0 7.6  --  5.0  --   CREATININE 3.28* 3.90* 3.36* 3.24* 4.65*    Estimated Creatinine Clearance: 10.8 mL/min (by C-G formula based on Cr of 4.65).    Allergies  Allergen Reactions  . Sulfa Antibiotics Other (See Comments)    Reaction:  Blood in the urine     Antimicrobials this admission: Anti-infectives    Start     Dose/Rate Route Frequency Ordered Stop   03/17/15 1000  levofloxacin (LEVAQUIN) tablet 250 mg     250 mg Oral Every 48 hours 03/17/2015 0735     03/07/2015 0800  levofloxacin (LEVAQUIN) tablet 500 mg     500 mg Oral  Once 03/03/2015 1610         Microbiology results: Results for orders placed or performed during the hospital encounter of 03/04/15  MRSA PCR Screening     Status: None   Collection Time: 03/05/15  8:09 AM  Result Value Ref Range Status   MRSA by PCR NEGATIVE NEGATIVE Final    Comment:        The GeneXpert MRSA Assay (FDA approved for NASAL specimens only), is one component of a comprehensive MRSA colonization surveillance program. It is not intended to diagnose MRSA infection nor to guide or monitor treatment for MRSA infections.     Thank you for allowing pharmacy to be a part of this patient's care.  Andrick Rust G 03/01/2015 7:35 AM

## 2015-03-20 NOTE — Progress Notes (Signed)
Call placed to [facility] to inform that patient nurse and charge nurse cannot palpate a pulse. Pt is cheyne stokes and having periods of apnea. ELINK stated that since pt did not want to be intubated, the patient cannot be placed on the vent, and will probably not need something for anxiety that was called for earlier.

## 2015-03-20 NOTE — Progress Notes (Signed)
Son Savannah Key took patient's four rings, and silver colored watch, along with her personal belongings in her blue  dialysis bag, her coat and clothes. Will call tomorrow to the administrative coordinator to give funeral home information.

## 2015-03-20 NOTE — Progress Notes (Signed)
Patient pronounced dead at 9.22 PM. Discussed with Family.

## 2015-03-20 NOTE — Progress Notes (Signed)
   2015/03/23 2120  Clinical Encounter Type  Visited With Patient;Family  Visit Type Death  Referral From Nurse  Consult/Referral To Chaplain  Spiritual Encounters  Spiritual Needs Grief support;Prayer  Stress Factors  Patient Stress Factors Loss  Family Stress Factors Family relationships;Loss  Met w/family of deceased to provide grief care, prayer, and compassionate presence.  Chap. Waymon Laser G. [place]

## 2015-03-20 NOTE — Care Management (Signed)
patient transferred to icu from 2A this morning due to decline in respiratory status requiring continuous bipapp. Updated PACE.

## 2015-03-20 NOTE — Progress Notes (Signed)
Subjective:  Patient is known to our practice from previous admissions Patient seen during dialysis Tolerating well    HEMODIALYSIS FLOWSHEET:  Blood Flow Rate (mL/min): 400 mL/min Arterial Pressure (mmHg): -180 mmHg Venous Pressure (mmHg): 190 mmHg Transmembrane Pressure (mmHg): 70 mmHg Ultrafiltration Rate (mL/min): 830 mL/min Dialysate Flow Rate (mL/min): 600 ml/min Conductivity: Machine : 14 Conductivity: Machine : 14 Dialysis Fluid Bolus: Normal Saline Bolus Amount (mL): 250 mL Intra-Hemodialysis Comments: ..Tx completed.Cardiologist bedside rounding..T  Required NIPPV for SOB Levophed started    Objective:  Vital signs in last 24 hours:  Temp:  [98.4 F (36.9 C)-100.2 F (37.9 C)] 98.6 F (37 C) (02/24 1342) Pulse Rate:  [77-133] 121 (02/24 1342) Resp:  [18-39] 27 (02/24 1342) BP: (70-214)/(27-148) 92/61 mmHg (02/24 1342) SpO2:  [88 %-100 %] 91 % (02/24 1342) FiO2 (%):  [60 %] 60 % (02/24 1150) Weight:  [90.2 kg (198 lb 13.7 oz)-91.2 kg (201 lb 1 oz)] 90.2 kg (198 lb 13.7 oz) (02/24 1342)  Weight change: -2.39 kg (-5 lb 4.3 oz) Filed Weights   03/19/2015 0539 02/24/2015 0945 03/16/2015 1342  Weight: 90.81 kg (200 lb 3.2 oz) 91.2 kg (201 lb 1 oz) 90.2 kg (198 lb 13.7 oz)    Intake/Output:    Intake/Output Summary (Last 24 hours) at 03/13/2015 1456 Last data filed at 03/10/2015 1342  Gross per 24 hour  Intake    340 ml  Output   1500 ml  Net  -1160 ml     Physical Exam: General: Obese, laying in the bed, critically ill  HEENT anicteric  Neck supple  Pulm/lungs Bilateral diffuse rhonchi, BiPAP  CVS/Heart irregular  Abdomen:  Soft, obese, nontender  Extremities: + dependent edema  Neurologic: Alert, oriented  Skin: No acute rashes  Access: AV fistula       Basic Metabolic Panel:   Recent Labs Lab 04/08/2015 1715 03/12/15 0348 03/13/15 0737 03/14/15 0510 03/14/2015 0533  NA 136 135 140 139 138  K 3.8 4.0 5.0 4.4 5.0  CL 97* 98* 99* 96* 95*   CO2 29 27 33* 31 30  GLUCOSE 142* 213* 109* 236* 249*  BUN 19 24* 20 20 36*  CREATININE 3.28* 3.90* 3.36* 3.24* 4.65*  CALCIUM 7.9* 7.9* 8.0* 8.3* 8.5*  PHOS  --   --   --   --  4.6     CBC:  Recent Labs Lab 08-Apr-2015 1715 03/12/15 0348 03/14/15 0510  WBC 7.0 7.6 5.0  HGB 7.8* 8.0* 7.8*  HCT 26.2* 26.1* 25.9*  MCV 80.6 81.5 81.7  PLT 76* 77* 72*      Microbiology:  Recent Results (from the past 720 hour(s))  MRSA PCR Screening     Status: None   Collection Time: 03/05/15  8:09 AM  Result Value Ref Range Status   MRSA by PCR NEGATIVE NEGATIVE Final    Comment:        The GeneXpert MRSA Assay (FDA approved for NASAL specimens only), is one component of a comprehensive MRSA colonization surveillance program. It is not intended to diagnose MRSA infection nor to guide or monitor treatment for MRSA infections.     Coagulation Studies: No results for input(s): LABPROT, INR in the last 72 hours.  Urinalysis: No results for input(s): COLORURINE, LABSPEC, PHURINE, GLUCOSEU, HGBUR, BILIRUBINUR, KETONESUR, PROTEINUR, UROBILINOGEN, NITRITE, LEUKOCYTESUR in the last 72 hours.  Invalid input(s): APPERANCEUR    Imaging: Dg Chest 1 View  03/14/2015  CLINICAL DATA:  75 year old female with shortness of breath  and hypotension. EXAM: CHEST 1 VIEW COMPARISON:  March 29, 2015 FINDINGS: Cardiomegaly and pulmonary vascular congestion again noted. New mild interstitial and airspace opacities likely represent edema but pneumonia is not excluded. There is no evidence of pneumothorax or large pleural effusion. No acute bony abnormalities are noted. IMPRESSION: New mild interstitial and airspace opacities likely representing pulmonary edema but pneumonia is not excluded. Electronically Signed   By: Harmon Pier M.D.   On: 03/14/2015 13:36   Dg Chest Port 1 View  02/21/2015  CLINICAL DATA:  Central line placement EXAM: PORTABLE CHEST 1 VIEW COMPARISON:  03/14/2015 FINDINGS: 1124 hours. Right  IJ central line tip overlies the mid right atrium. No evidence for pneumothorax. The cardio pericardial silhouette is enlarged. There is pulmonary vascular congestion without overt pulmonary edema. Bilateral patchy airspace disease with a central predominance persist. The visualized bony structures of the thorax are intact. Telemetry leads overlie the chest. IMPRESSION: Right IJ central line tip overlies the mid right atrium. No evidence of pneumothorax. Cardiomegaly with pulmonary edema, as before. Electronically Signed   By: Kennith Center M.D.   On: 03/18/2015 11:46     Medications:   . norepinephrine (LEVOPHED) Adult infusion 15 mcg/min (02/24/2015 1141)  . phenylephrine (NEO-SYNEPHRINE) Adult infusion     . antiseptic oral rinse  7 mL Mouth Rinse q12n4p  . aspirin EC  81 mg Oral Daily  . calcium acetate  2,001 mg Oral Daily  . chlorhexidine  15 mL Mouth Rinse BID  . clopidogrel  75 mg Oral Daily  . heparin  5,000 Units Subcutaneous Q12H  . insulin aspart  0-9 Units Subcutaneous 6 times per day  . levothyroxine  75 mcg Oral QAC breakfast  . [START ON 03/16/2015] pantoprazole (PROTONIX) IV  40 mg Intravenous Q24H  . pregabalin  75 mg Oral QHS  . rOPINIRole  2 mg Oral QHS  . sodium chloride flush  3 mL Intravenous Q12H  . traZODone  50 mg Oral QHS  . venlafaxine XR  37.5 mg Oral QHS   acetaminophen **OR** acetaminophen, HYDROcodone-acetaminophen, ipratropium-albuterol, metoprolol, ondansetron **OR** ondansetron (ZOFRAN) IV  Assessment/ Plan:  75 y.o. female  is a 75 y.o. white female with end-stage renal disease, carotid stenosis, coronary artery disease, diabetes type 2, history of myocardial infarction, hypertension, peripheral vascular disease.  MWF [facility]  1. ESRD on HD MWF: - Patient seen during dialysis  2. Anemia of CKD: hemoglobin of 7.8 - continue Epogen 10,000 units IV with dialysis while inpatient.   3. Secondary Hyperparathyroidism:  - monitor  phosphorus this admission  4.  Acute pulmonary edema by clinical assessment - 5 L removed so far this admission - requiring NIPPV today  5. Low back pain - trial of ibuprofen   LOS: 3 Savannah Key 2/24/20172:56 PM

## 2015-03-20 NOTE — Discharge Summary (Signed)
75 year old female with a history of end-stage renal disease on hemodialysis and combined systolic and diastolic heart failure who presents with shortness of breath and weight gain.  1. Acute on chronic combined systolic EF 40-45% and diastolic heart failure: Patient was treated for diastolic heart failure with dialysis. Her blood pressures were low and therefore she was transferred to stepdown unit. In the evening she was transferred to the stepdown unit. She had increasing respiratory distress. The patient did not want to be intubated. She also did not want CPR. Nurse found her not to have a pulse. She was pronounced at 9:22 PM.  2. ESRD on dialysis:  3. OSA:  4. Type 2 diabetes:   5. Hypothyroid:  6. Anxiety and depression:  7. Anemia of chronic disease:  8. Lower back pain with coughing: 9. Hypotension: . Transferred to CCU for CRRT and closer observation  10. Pneumonia: Treated with Levaquin  11. ESRD: Received dialysis while she was here  12. Elevated troponin: Diminished ischemia Management plans discussed with the patient and she is in agreement.

## 2015-03-20 NOTE — Progress Notes (Signed)
Paged Dr. Zebedee Iba regarding patients Afib, SVT heart rate 100-130's.

## 2015-03-20 NOTE — Progress Notes (Addendum)
Pt pronounced by Dr. Elpidio Anis. Removed pt from Bipap per his request.

## 2015-03-20 NOTE — Progress Notes (Signed)
Spoke with Dr Coral Else still unable to get an oxygenation reading on her. At this point he does not believe an abg is needed due to her being a DNR. He did ask for me to get [facility] to camera in on her. Also paged Dr Gwen Pounds regarding patient troponin. At this point no additional orders.

## 2015-03-20 NOTE — Progress Notes (Signed)
Call placed to [facility] a second time to inform that patient's nurse and charge nurse was unable to get a pulse on the patient. [facility] nurse Romeo Apple wanted to know if MD had ordered medication for anxiety, but it was not needed at this time. ELINK said patient did not want to be intubated and was struggling to breath on 100% bipap. No further orders at this time.

## 2015-03-20 NOTE — Progress Notes (Signed)
Call placed to [facility] to request something for anxiety since pt still having difficulty breathing on the 100% bipap.

## 2015-03-20 NOTE — Progress Notes (Addendum)
[facility] [place] at [facility]   PATIENT NAME: Savannah Key    MR#:  [id number]  DATE OF BIRTH:  10/30/1940  SUBJECTIVE:  Patient with low blood pressure this morning. She is feeling fatigued Did not want to wear BiPAP.  REVIEW OF SYSTEMS:    Review of Systems  Constitutional: Negative for fever, chills and malaise/fatigue.  HENT: Negative for sore throat.   Eyes: Negative for blurred vision.  Respiratory: Positive for cough. Negative for hemoptysis, shortness of breath and wheezing.   Cardiovascular: Negative for chest pain, palpitations, orthopnea and leg swelling.  Gastrointestinal: Negative for nausea, vomiting, abdominal pain, diarrhea and blood in stool.  Genitourinary: Negative for dysuria.  Skin: Negative for itching and rash.  Neurological: Positive for weakness. Negative for dizziness, tremors and headaches.  Endo/Heme/Allergies: Does not bruise/bleed easily.  Psychiatric/Behavioral: Negative for depression, suicidal ideas and hallucinations.    Tolerating Diet: Yes      DRUG ALLERGIES:   Allergies  Allergen Reactions  . Sulfa Antibiotics Other (See Comments)    Reaction:  Blood in the urine     VITALS:  Blood pressure 71/40, pulse 123, temperature 100.2 F (37.9 C), temperature source Axillary, resp. rate 22, height 5' (1.524 m), weight 91.2 kg (201 lb 1 oz), SpO2 98 %.  PHYSICAL EXAMINATION:   Physical Exam  Constitutional: She is oriented to person, place, and time and well-developed, well-nourished, and in no distress. No distress.  HENT:  Head: Normocephalic.  Eyes: No scleral icterus.  Neck: Normal range of motion. Neck supple. No JVD present. No tracheal deviation present.  Cardiovascular: Normal rate, regular rhythm and normal heart sounds.  Exam reveals no gallop and no friction rub.   No murmur heard. Pulmonary/Chest: Effort normal. No respiratory distress. She has wheezes. She has rales. She exhibits no  tenderness.  Abdominal: Soft. Bowel sounds are normal. She exhibits no distension and no mass. There is no tenderness. There is no rebound and no guarding.  Musculoskeletal: Normal range of motion.  Neurological: She is alert and oriented to person, place, and time.  Skin: Skin is warm. No rash noted. No erythema.  Psychiatric: Affect and judgment normal.      LABORATORY PANEL:   CBC  Recent Labs Lab 03/14/15 0510  WBC 5.0  HGB 7.8*  HCT 25.9*  PLT 72*   ------------------------------------------------------------------------------------------------------------------  Chemistries   Recent Labs Lab 03/07/2015 0533  NA 138  K 5.0  CL 95*  CO2 30  GLUCOSE 249*  BUN 36*  CREATININE 4.65*  CALCIUM 8.5*   ------------------------------------------------------------------------------------------------------------------  Cardiac Enzymes  Recent Labs Lab 03/13/15 0737 03/13/15 1312 03/13/15 1916  TROPONINI 1.49* 1.34* 1.28*   ------------------------------------------------------------------------------------------------------------------  RADIOLOGY:  Dg Chest 1 View  03/14/2015  CLINICAL DATA:  75 year old female with shortness of breath and hypotension. EXAM: CHEST 1 VIEW COMPARISON:  March 25, 2015 FINDINGS: Cardiomegaly and pulmonary vascular congestion again noted. New mild interstitial and airspace opacities likely represent edema but pneumonia is not excluded. There is no evidence of pneumothorax or large pleural effusion. No acute bony abnormalities are noted. IMPRESSION: New mild interstitial and airspace opacities likely representing pulmonary edema but pneumonia is not excluded. Electronically Signed   By: Harmon Pier M.D.   On: 03/14/2015 13:36     ASSESSMENT AND PLAN:   75 year old female with a history of end-stage renal disease on hemodialysis and combined systolic and diastolic heart failure who presents with shortness of breath and weight gain.  1. Acute  on chronic combined systolic EF 40-45% and diastolic heart failure: Patient is to continue with dialysis for volume overload. She will be transferred to stepdown unit for dialysis due to low blood pressure.  2. ESRD on dialysis: Dialysis management as per nephrology team. Consult appreciated. She follows at [facility] nephrology as an outpatient  3. OSA: Continue BiPAP.  4. Type 2 diabetes: Continue sliding scale insulin and ADA diet.  5. Hypothyroid: Continue Synthroid  6. Anxiety and depression: Continue Effexor  7. Anemia of chronic disease: Hemoglobin stable. Continue EPO 8. Lower back pain with coughing: Improved. Continue when necessary ibuprofen  9. Hypotension: Blood pressure low this morning. Continue Minitran. Patient will need CRRT.   10. Pneumonia: Patient will be started on Levaquin. Pneumonia was probably present on admission but not seen due to CHF. Pharmacy to dose Levaquin.  11. ESRD: Patient will now require CRRT due to low blood pressure. Intensivist consulted   12. Elevated troponin: Awaiting Cardiology consult, likely demand ischemia from CHF and poor renal clearance. Management plans discussed with the patient and she is in agreement.  CODE STATUS: FULL  CRITICAL CARE TOTAL TIME TAKING CARE OF THIS PATIENT:  35 minutes.     POSSIBLE D/C ??, DEPENDING ON CLINICAL CONDITION. She will be discharged to skilled nursing facility once medical stable.  [name] M.D on 02/22/2015 at 11:35 AM  Between 7am to 6pm - Pager - [phone] After 6pm go to [email or web address] - password [email or web address]  [facility]  Office  [phone]  CC: Primary care physician; Bobbye Morton, MD  Note: This dictation was prepared with Dragon dictation along with smaller phrase technology. Any transcriptional errors that result from this process are unintentional.

## 2015-03-20 NOTE — Procedures (Signed)
Central Venous Catheter Placement: Indication: Patient receiving vesicant or irritant drug.; Patient receiving intravenous therapy for longer than 5 days.; Patient has limited or no vascular access.   Consent:emergent.   Risks and benefits explained in detail including risk of infection, bleeding, respiratory failure and death..   Hand washing performed prior to starting the procedure.   Procedure: An active timeout was performed and correct patient, name, & ID confirmed.  After explaining risk and benefits, patient was positioned correctly for central venous access. Patient was prepped using strict sterile technique including chlorohexadine preps, sterile drape, sterile gown and sterile gloves.  The area was prepped, draped and anesthetized in the usual sterile manner. Patient comfort was obtained.  Upon obtaining flash of blood, guidewire was floated but would not pass more than 10 cm. Therefore I removed the needle and repositioned, and again obtained flash from the vein. Guidewire was passed more easily. A triple lumen catheter was placed in right Internal Jugular Vein There was good blood return, catheter caps were placed on lumens, catheter flushed easily, the line was secured and a sterile dressing and BIO-PATCH applied.   Ultrasound was used to visualize vasculature and guidance of needle.   Number of Attempts: 1 Complications:small hematoma at the injection site.  Estimated Blood Loss: 20cc Chest Radiograph indicated and ordered.     Wells Guiles, M.D.  Pulmonary & Critical Care Medicine

## 2015-03-20 NOTE — Progress Notes (Signed)
Patient's O2 stats dropped to the 80's, BP increased to151/114. Lungs sounds rhonchi, and wheezing noted. Patient stated that she does not feel good. Dr. Sheryle Hail called. Informed Dr. Sheryle Hail that patient is on HD,and produce little urine. New order: laxis 40 mg. Patient placed on bipap o2 stats at 92 on 5 liters. Will continue to monitor.

## 2015-03-20 NOTE — Progress Notes (Addendum)
Pt Spo2 88-90% on bipap blood pressure 88/32. Requested to transfer patient to ICU for continuous bipap. Dr. Juliene Pina at bedside stated to keep patient on bipap and administer a dose of Midodrine 10 mg now and do not transfer patient to ICU at this time. Patient placed on continuous pulse ox. Patient States that she feels ok at this time just really tired. Will continue to monitor patient at this time.

## 2015-03-20 DEATH — deceased

## 2015-05-16 ENCOUNTER — Ambulatory Visit: Payer: Medicare (Managed Care) | Admitting: Internal Medicine
